# Patient Record
Sex: Female | Born: 1950 | Race: Black or African American | Hispanic: No | Smoking: Former smoker
Health system: Southern US, Community
[De-identification: ages and names within clinical notes are randomized; demographics above are authoritative.]

## PROBLEM LIST (undated history)

## (undated) DIAGNOSIS — G43909 Migraine, unspecified, not intractable, without status migrainosus: Secondary | ICD-10-CM

## (undated) DIAGNOSIS — G8929 Other chronic pain: Secondary | ICD-10-CM

## (undated) DIAGNOSIS — K219 Gastro-esophageal reflux disease without esophagitis: Secondary | ICD-10-CM

## (undated) DIAGNOSIS — E119 Type 2 diabetes mellitus without complications: Secondary | ICD-10-CM

## (undated) DIAGNOSIS — I499 Cardiac arrhythmia, unspecified: Secondary | ICD-10-CM

## (undated) DIAGNOSIS — T4145XA Adverse effect of unspecified anesthetic, initial encounter: Secondary | ICD-10-CM

## (undated) DIAGNOSIS — D497 Neoplasm of unspecified behavior of endocrine glands and other parts of nervous system: Secondary | ICD-10-CM

## (undated) DIAGNOSIS — I509 Heart failure, unspecified: Secondary | ICD-10-CM

## (undated) DIAGNOSIS — T8859XA Other complications of anesthesia, initial encounter: Secondary | ICD-10-CM

## (undated) DIAGNOSIS — F419 Anxiety disorder, unspecified: Secondary | ICD-10-CM

## (undated) DIAGNOSIS — M797 Fibromyalgia: Secondary | ICD-10-CM

## (undated) DIAGNOSIS — J42 Unspecified chronic bronchitis: Secondary | ICD-10-CM

## (undated) DIAGNOSIS — J189 Pneumonia, unspecified organism: Secondary | ICD-10-CM

## (undated) DIAGNOSIS — J449 Chronic obstructive pulmonary disease, unspecified: Secondary | ICD-10-CM

## (undated) DIAGNOSIS — Z87442 Personal history of urinary calculi: Secondary | ICD-10-CM

## (undated) DIAGNOSIS — Z8489 Family history of other specified conditions: Secondary | ICD-10-CM

## (undated) DIAGNOSIS — M1990 Unspecified osteoarthritis, unspecified site: Secondary | ICD-10-CM

## (undated) DIAGNOSIS — I10 Essential (primary) hypertension: Secondary | ICD-10-CM

## (undated) DIAGNOSIS — D649 Anemia, unspecified: Secondary | ICD-10-CM

## (undated) DIAGNOSIS — E785 Hyperlipidemia, unspecified: Secondary | ICD-10-CM

## (undated) DIAGNOSIS — I739 Peripheral vascular disease, unspecified: Secondary | ICD-10-CM

## (undated) DIAGNOSIS — M549 Dorsalgia, unspecified: Secondary | ICD-10-CM

## (undated) HISTORY — PX: BILATERAL CARPAL TUNNEL RELEASE: SHX6508

## (undated) HISTORY — PX: APPENDECTOMY: SHX54

## (undated) HISTORY — PX: REPLACEMENT TOTAL KNEE: SUR1224

## (undated) HISTORY — DX: Anemia, unspecified: D64.9

## (undated) HISTORY — DX: Peripheral vascular disease, unspecified: I73.9

## (undated) HISTORY — DX: Anxiety disorder, unspecified: F41.9

## (undated) HISTORY — DX: Hyperlipidemia, unspecified: E78.5

## (undated) HISTORY — PX: TONSILLECTOMY: SUR1361

## (undated) HISTORY — DX: Heart failure, unspecified: I50.9

## (undated) HISTORY — DX: Unspecified osteoarthritis, unspecified site: M19.90

## (undated) HISTORY — PX: CARDIAC CATHETERIZATION: SHX172

## (undated) HISTORY — PX: COLONOSCOPY: SHX174

## (undated) HISTORY — PX: POSTERIOR LUMBAR FUSION: SHX6036

## (undated) HISTORY — PX: BACK SURGERY: SHX140

---

## 1976-05-18 HISTORY — PX: TUBAL LIGATION: SHX77

## 2002-07-07 ENCOUNTER — Encounter: Payer: Self-pay | Admitting: *Deleted

## 2002-07-07 ENCOUNTER — Inpatient Hospital Stay (HOSPITAL_COMMUNITY): Admission: EM | Admit: 2002-07-07 | Discharge: 2002-07-13 | Payer: Self-pay | Admitting: *Deleted

## 2003-02-20 ENCOUNTER — Encounter: Payer: Self-pay | Admitting: *Deleted

## 2003-02-20 ENCOUNTER — Emergency Department (HOSPITAL_COMMUNITY): Admission: EM | Admit: 2003-02-20 | Discharge: 2003-02-20 | Payer: Self-pay | Admitting: *Deleted

## 2003-07-20 ENCOUNTER — Emergency Department (HOSPITAL_COMMUNITY): Admission: EM | Admit: 2003-07-20 | Discharge: 2003-07-21 | Payer: Self-pay | Admitting: *Deleted

## 2003-07-25 ENCOUNTER — Emergency Department (HOSPITAL_COMMUNITY): Admission: EM | Admit: 2003-07-25 | Discharge: 2003-07-25 | Payer: Self-pay | Admitting: Emergency Medicine

## 2005-05-22 ENCOUNTER — Emergency Department (HOSPITAL_COMMUNITY): Admission: EM | Admit: 2005-05-22 | Discharge: 2005-05-22 | Payer: Self-pay | Admitting: Emergency Medicine

## 2006-06-19 ENCOUNTER — Emergency Department (HOSPITAL_COMMUNITY): Admission: EM | Admit: 2006-06-19 | Discharge: 2006-06-19 | Payer: Self-pay | Admitting: Emergency Medicine

## 2010-06-08 ENCOUNTER — Encounter: Payer: Self-pay | Admitting: Emergency Medicine

## 2011-01-16 ENCOUNTER — Emergency Department (HOSPITAL_COMMUNITY)
Admission: EM | Admit: 2011-01-16 | Discharge: 2011-01-17 | Disposition: A | Discharge status: Home / Self Care | Payer: Medicare Other | Attending: Emergency Medicine | Admitting: Emergency Medicine

## 2011-01-16 ENCOUNTER — Emergency Department (HOSPITAL_COMMUNITY): Payer: Medicare Other

## 2011-01-16 DIAGNOSIS — R109 Unspecified abdominal pain: Secondary | ICD-10-CM | POA: Insufficient documentation

## 2011-01-16 DIAGNOSIS — K5900 Constipation, unspecified: Secondary | ICD-10-CM | POA: Insufficient documentation

## 2011-01-16 DIAGNOSIS — E119 Type 2 diabetes mellitus without complications: Secondary | ICD-10-CM | POA: Insufficient documentation

## 2011-01-16 DIAGNOSIS — K625 Hemorrhage of anus and rectum: Secondary | ICD-10-CM | POA: Insufficient documentation

## 2011-01-16 DIAGNOSIS — R197 Diarrhea, unspecified: Secondary | ICD-10-CM | POA: Insufficient documentation

## 2011-01-16 DIAGNOSIS — Z794 Long term (current) use of insulin: Secondary | ICD-10-CM | POA: Insufficient documentation

## 2011-01-16 DIAGNOSIS — R112 Nausea with vomiting, unspecified: Secondary | ICD-10-CM | POA: Insufficient documentation

## 2011-01-16 HISTORY — DX: Chronic obstructive pulmonary disease, unspecified: J44.9

## 2011-01-16 HISTORY — DX: Essential (primary) hypertension: I10

## 2011-01-16 LAB — CBC
HCT: 37.1 % (ref 36.0–46.0)
MCHC: 32.3 g/dL (ref 30.0–36.0)
Platelets: 318 10*3/uL (ref 150–400)
RDW: 14.8 % (ref 11.5–15.5)
WBC: 10.8 10*3/uL — ABNORMAL HIGH (ref 4.0–10.5)

## 2011-01-16 LAB — LIPASE, BLOOD: Lipase: 24 U/L (ref 11–59)

## 2011-01-16 LAB — DIFFERENTIAL
Basophils Absolute: 0.1 10*3/uL (ref 0.0–0.1)
Basophils Relative: 1 % (ref 0–1)
Eosinophils Absolute: 0.1 10*3/uL (ref 0.0–0.7)
Eosinophils Relative: 1 % (ref 0–5)
Monocytes Absolute: 0.5 10*3/uL (ref 0.1–1.0)

## 2011-01-16 LAB — COMPREHENSIVE METABOLIC PANEL
ALT: 9 U/L (ref 0–35)
Alkaline Phosphatase: 65 U/L (ref 39–117)
BUN: 7 mg/dL (ref 6–23)
CO2: 30 mEq/L (ref 19–32)
GFR calc Af Amer: >60 mL/min (ref >60)
GFR calc non Af Amer: >60 mL/min (ref >60)
Glucose, Bld: 116 mg/dL — ABNORMAL HIGH (ref 70–99)
Potassium: 3.4 mEq/L — ABNORMAL LOW (ref 3.5–5.1)
Sodium: 141 mEq/L (ref 135–145)
Total Bilirubin: 0.2 mg/dL — ABNORMAL LOW (ref 0.3–1.2)

## 2011-01-16 LAB — URINALYSIS, ROUTINE W REFLEX MICROSCOPIC
Bilirubin Urine: NEGATIVE
Nitrite: NEGATIVE
Specific Gravity, Urine: 1.018 (ref 1.005–1.030)
Urobilinogen, UA: 0.2 mg/dL (ref 0.0–1.0)
pH: 5.5 (ref 5.0–8.0)

## 2011-01-16 LAB — URINE MICROSCOPIC-ADD ON

## 2011-01-16 LAB — GLUCOSE, CAPILLARY: Glucose-Capillary: 110 mg/dL — ABNORMAL HIGH (ref 70–99)

## 2011-01-17 ENCOUNTER — Encounter (HOSPITAL_COMMUNITY): Payer: Self-pay | Admitting: Radiology

## 2011-01-17 MED ORDER — IOHEXOL 300 MG/ML  SOLN
100.0000 mL | Freq: Once | INTRAMUSCULAR | Status: AC | PRN
  Administered 2011-01-17: 100 mL via INTRAVENOUS

## 2011-06-25 ENCOUNTER — Emergency Department (HOSPITAL_COMMUNITY)
Admission: EM | Admit: 2011-06-25 | Discharge: 2011-06-25 | Disposition: A | Discharge status: Home / Self Care | Payer: Medicare Other | Attending: Emergency Medicine | Admitting: Emergency Medicine

## 2011-06-25 ENCOUNTER — Emergency Department (HOSPITAL_COMMUNITY): Payer: Medicare Other

## 2011-06-25 ENCOUNTER — Encounter (HOSPITAL_COMMUNITY): Payer: Self-pay | Admitting: Emergency Medicine

## 2011-06-25 DIAGNOSIS — I10 Essential (primary) hypertension: Secondary | ICD-10-CM | POA: Insufficient documentation

## 2011-06-25 DIAGNOSIS — D571 Sickle-cell disease without crisis: Secondary | ICD-10-CM | POA: Insufficient documentation

## 2011-06-25 DIAGNOSIS — J4489 Other specified chronic obstructive pulmonary disease: Secondary | ICD-10-CM | POA: Insufficient documentation

## 2011-06-25 DIAGNOSIS — J449 Chronic obstructive pulmonary disease, unspecified: Secondary | ICD-10-CM | POA: Insufficient documentation

## 2011-06-25 DIAGNOSIS — E119 Type 2 diabetes mellitus without complications: Secondary | ICD-10-CM | POA: Insufficient documentation

## 2011-06-25 DIAGNOSIS — R197 Diarrhea, unspecified: Secondary | ICD-10-CM | POA: Insufficient documentation

## 2011-06-25 DIAGNOSIS — Z7982 Long term (current) use of aspirin: Secondary | ICD-10-CM | POA: Insufficient documentation

## 2011-06-25 DIAGNOSIS — R5381 Other malaise: Secondary | ICD-10-CM | POA: Insufficient documentation

## 2011-06-25 DIAGNOSIS — R109 Unspecified abdominal pain: Secondary | ICD-10-CM | POA: Insufficient documentation

## 2011-06-25 DIAGNOSIS — R112 Nausea with vomiting, unspecified: Secondary | ICD-10-CM | POA: Insufficient documentation

## 2011-06-25 DIAGNOSIS — Z79899 Other long term (current) drug therapy: Secondary | ICD-10-CM | POA: Insufficient documentation

## 2011-06-25 LAB — COMPREHENSIVE METABOLIC PANEL
ALT: 7 U/L (ref 0–35)
AST: 10 U/L (ref 0–37)
Alkaline Phosphatase: 65 U/L (ref 39–117)
CO2: 26 mEq/L (ref 19–32)
Calcium: 9.7 mg/dL (ref 8.4–10.5)
GFR calc non Af Amer: 70 mL/min — ABNORMAL LOW (ref >90)
Potassium: 3.1 mEq/L — ABNORMAL LOW (ref 3.5–5.1)
Sodium: 143 mEq/L (ref 135–145)

## 2011-06-25 LAB — URINALYSIS, ROUTINE W REFLEX MICROSCOPIC
Bilirubin Urine: NEGATIVE
Glucose, UA: NEGATIVE mg/dL
Protein, ur: NEGATIVE mg/dL
Specific Gravity, Urine: 1.019 (ref 1.005–1.030)

## 2011-06-25 LAB — CBC
Hemoglobin: 13.2 g/dL (ref 12.0–15.0)
MCH: 29.2 pg (ref 26.0–34.0)
Platelets: 365 10*3/uL (ref 150–400)
RBC: 4.52 MIL/uL (ref 3.87–5.11)
WBC: 14.4 10*3/uL — ABNORMAL HIGH (ref 4.0–10.5)

## 2011-06-25 MED ORDER — ONDANSETRON HCL 4 MG PO TABS: 4.0000 mg | ORAL_TABLET | Freq: Four times a day (QID) | ORAL | Status: AC

## 2011-06-25 MED ORDER — ONDANSETRON HCL 4 MG/2ML IJ SOLN
INTRAMUSCULAR | Status: AC
  Administered 2011-06-25: 09:00:00
  Filled 2011-06-25: qty 2

## 2011-06-25 MED ORDER — MORPHINE SULFATE 4 MG/ML IJ SOLN
4.0000 mg | Freq: Once | INTRAMUSCULAR | Status: AC
  Administered 2011-06-25: 4 mg via INTRAVENOUS
  Filled 2011-06-25: qty 1

## 2011-06-25 MED ORDER — ONDANSETRON HCL 4 MG/2ML IJ SOLN
4.0000 mg | Freq: Once | INTRAMUSCULAR | Status: AC
  Administered 2011-06-25: 4 mg via INTRAVENOUS
  Filled 2011-06-25: qty 2

## 2011-06-25 MED ORDER — POTASSIUM CHLORIDE CRYS ER 20 MEQ PO TBCR
40.0000 meq | EXTENDED_RELEASE_TABLET | Freq: Once | ORAL | Status: AC
  Administered 2011-06-25: 40 meq via ORAL
  Filled 2011-06-25: qty 2

## 2011-06-25 MED ORDER — SODIUM CHLORIDE 0.9 % IV BOLUS (SEPSIS)
500.0000 mL | Freq: Once | INTRAVENOUS | Status: AC
  Administered 2011-06-25: 500 mL via INTRAVENOUS

## 2011-06-25 NOTE — ED Notes (Signed)
Pt c/o n/v/d with abd pain x 3 days.

## 2011-06-25 NOTE — ED Provider Notes (Signed)
History     CSN: [id number]  Arrival date & time 06/25/11  0865   First MD Initiated Contact with Patient 06/25/11 [phone]      Chief Complaint  Patient presents with  . Nausea  . Emesis  . Diarrhea  . Abdominal Pain    (Consider location/radiation/quality/duration/timing/severity/associated sxs/prior treatment) HPI Patient presents with complaint of vomiting and diarrhea over the past 3 days. She states she has had multiple episodes of nonbloody and nonbilious emesis as well as multiple episodes of watery diarrhea. She denies any fever. She does complain of diffuse abdominal pain which is cramping in nature and temporarily relieved after passing a stool. She's been no blood in her stool or melena. She does have one family member with similar symptoms. She denies any dysuria, urinary frequency or urgency. Today she felt more fatigued and generally weak which prompted ED evaluation. There no other associated systemic symptoms. There is nothing that has made symptoms worse or better. She is diabetic and has been decreasing her insulin dose she's not been taking in many solids.  Past Medical History  Diagnosis Date  . Diabetes mellitus   . Hypertension   . COPD (chronic obstructive pulmonary disease)   . Sickle cell anemia     Past Surgical History  Procedure Date  . Back surgery   . Cardiac catheterization   . Cesarean section     x 3  . Tonsillectomy   . Appendectomy   . Colonoscopy     History reviewed. No pertinent family history.  History  Substance Use Topics  . Smoking status: Not on file  . Smokeless tobacco: Not on file  . Alcohol Use:     OB History    Grav Para Term Preterm Abortions TAB SAB Ect Mult Living                  Review of Systems ROS reviewed and otherwise negative except for mentioned in HPI  Allergies  Review of patient's allergies indicates no known allergies.  Home Medications   Current Outpatient Rx  Name Route Sig Dispense Refill    . ASPIRIN 325 MG PO TABS Oral Take 325 mg by mouth daily.    . ATORVASTATIN CALCIUM 80 MG PO TABS Oral Take 80 mg by mouth daily.    Marland Kitchen CARVEDILOL 6.25 MG PO TABS Oral Take 6.25 mg by mouth 2 (two) times daily with a meal.    . COLACE PO Oral Take 1 capsule by mouth 2 (two) times daily.    Marland Kitchen CONJ ESTROG-MEDROXYPROGEST ACE 0.45-1.5 MG PO TABS Oral Take 1 tablet by mouth daily.    Marland Kitchen FAMOTIDINE 20 MG PO TABS Oral Take 20 mg by mouth 2 (two) times daily.    . FENTANYL 100 MCG/HR TD PT72 Transdermal Place 1 patch onto the skin every 3 (three) days.    . FENTANYL 50 MCG/HR TD PT72 Transdermal Place 1 patch onto the skin every 3 (three) days.    . INSULIN ISOPHANE & REGULAR (70-30) 100 UNIT/ML [place] SUSP Subcutaneous Inject 20 Units into the skin 2 (two) times daily before a meal. Per sliding scale.    Marland Kitchen LISINOPRIL-HYDROCHLOROTHIAZIDE 20-12.5 MG PO TABS Oral Take 1 tablet by mouth daily.    Marland Kitchen PREGABALIN 150 MG PO CAPS Oral Take 150 mg by mouth 4 (four) times daily.    Marland Kitchen PROMETHAZINE HCL 25 MG PO TABS Oral Take 25 mg by mouth every 4 (four) hours as needed. For nausea    .  TIZANIDINE HCL 2 MG PO TABS Oral Take 2 mg by mouth every 8 (eight) hours.    . TRAMADOL HCL 50 MG PO TABS Oral Take 50 mg by mouth 2 (two) times daily.    Marland Kitchen ONDANSETRON HCL 4 MG PO TABS Oral Take 1 tablet (4 mg total) by mouth every 6 (six) hours. 12 tablet 0    BP 154/69  Pulse 77  Temp(Src) 98.1 F (36.7 C) (Oral)  Resp 24  Ht 5\' 2"  (1.575 m)  Wt 233 lb (105.688 kg)  BMI 42.62 kg/m2  SpO2 98% Vitals reviewed Physical Exam Physical Examination: General appearance - alert, well appearing, and in no distress Mental status - alert, oriented to person, place, and time Eyes - pupils equal and reactive, no scleral icterus Mouth - mucous membranes moist, pharynx normal without lesions Chest - clear to auscultation, no wheezes, rales or rhonchi, symmetric air entry Heart - normal rate, regular rhythm, normal S1, S2, no murmurs,  rubs, clicks or gallops Abdomen - soft, nontender, nondistended, no masses or organomegaly Neurological - alert, oriented, normal speech, no focal findings Musculoskeletal - no joint tenderness, deformity or swelling Extremities - peripheral pulses normal, no pedal edema, no clubbing or cyanosis Skin - normal coloration and turgor, no rashes  ED Course  Procedures (including critical care time)  1:46 PM pt tolerating po fluids, symptoms much improved.    Labs Reviewed  CBC - Abnormal; Notable for the following:    WBC 14.4 (*)    All other components within normal limits  COMPREHENSIVE METABOLIC PANEL - Abnormal; Notable for the following:    Potassium 3.1 (*)    Glucose, Bld 133 (*)    Total Bilirubin 0.2 (*)    GFR calc non Af Amer 70 (*)    GFR calc Af Amer 81 (*)    All other components within normal limits  URINALYSIS, ROUTINE W REFLEX MICROSCOPIC - Abnormal; Notable for the following:    Hgb urine dipstick MODERATE (*)    All other components within normal limits  URINE MICROSCOPIC-ADD ON - Abnormal; Notable for the following:    Squamous Epithelial / LPF FEW (*) FEW   All other components within normal limits  LIPASE, BLOOD  URINE CULTURE   Dg Abd Acute W/chest  06/25/2011  *RADIOLOGY REPORT*  Clinical Data: Upper abdominal pain with nausea and vomiting.  ACUTE ABDOMEN SERIES (ABDOMEN 2 VIEW & CHEST 1 VIEW)  Comparison: Acute abdominal series 05/22/2005.  Abdominal pelvic CT 01/17/2011.  Findings: The heart size and mediastinal contours are stable.  The lungs are clear.  There is no pleural effusion or pneumothorax.  The abdomen is nearly gasless.  Some air is present within the stomach and rectum.  No bowel wall thickening or free intraperitoneal air is identified.  Left paraspinal and multiple pelvic calcifications are unchanged, corresponding with phleboliths on prior CT.  No acute osseous findings are evident.  IMPRESSION:  1.  No acute abdominal or cardiopulmonary process  identified. 2.  Multiple paraspinal and pelvic phleboliths appear unchanged.  Original Report Authenticated By: Gerrianne Scale, M.D.     1. Nausea vomiting and diarrhea       MDM  Patient with complaint of vomiting and diarrhea which has been going on for 2-3 days. Her workup in the ED today revealed mild hypokalemia and mild hyperglycemia as well as hypertension. Other labs as well as acute abdominal series were reassuring. Patient appears well-hydrated. Her vomiting was controlled after Zofran. She  was given IV hydration with normal saline and her symptoms have much improved. Her potassium was repleted orally and she has been tolerating fluids by mouth in the ED period patient is to be discharged with strict return precautions and she verbalizes understanding of this plan and is in agreement        Ethelda Chick, MD 06/26/11 [phone]

## 2011-06-25 NOTE — ED Notes (Addendum)
Patient resting and remains on monitor and oxygen sats are 98% on RA.

## 2011-06-25 NOTE — ED Notes (Signed)
Per EMS - N/V and diarrhea with Left bundle block on 12 lead. Patient stated to EMS that she was not aware of any blockages in her heart. CBG 99. BP 198/104 HR 90 Resp 20 Saturation 100 on 2L. 20 ga. In left hand. EMS administered zofran with no relief.

## 2011-06-25 NOTE — ED Notes (Addendum)
Patient states he was getting ready for work this morning and thought he twisted wrong and started to have pain in his left side and pain radiating to LLQ of Abdomen. Patient states he has facial and bilateral arm tingling and numbness. Patient denies chest pain,  Vomiting or fever.

## 2011-06-25 NOTE — ED Notes (Signed)
Patient resting with NAD at this time. Patient remains on monitor with oxygen sats of 99% on RA. Family at bedside.

## 2011-06-25 NOTE — ED Notes (Signed)
Patient given ginger ale. 

## 2011-06-25 NOTE — ED Notes (Signed)
Patient resting with NAD at this time. Patient remains on monitor and oxygen sats of 100% RA.  Family at bedside.

## 2011-06-26 LAB — URINE CULTURE
Colony Count: NO GROWTH
Culture: NO GROWTH

## 2012-03-31 DIAGNOSIS — G43909 Migraine, unspecified, not intractable, without status migrainosus: Secondary | ICD-10-CM | POA: Insufficient documentation

## 2012-04-20 DIAGNOSIS — G8929 Other chronic pain: Secondary | ICD-10-CM | POA: Insufficient documentation

## 2012-06-01 ENCOUNTER — Encounter: Payer: Self-pay | Admitting: Physical Medicine & Rehabilitation

## 2012-06-16 ENCOUNTER — Encounter (HOSPITAL_COMMUNITY): Payer: Self-pay

## 2012-06-20 ENCOUNTER — Encounter: Payer: Self-pay | Admitting: Physical Medicine & Rehabilitation

## 2012-06-20 ENCOUNTER — Encounter: Payer: Medicare Other | Attending: Physical Medicine & Rehabilitation

## 2012-06-20 ENCOUNTER — Ambulatory Visit (HOSPITAL_BASED_OUTPATIENT_CLINIC_OR_DEPARTMENT_OTHER): Payer: Medicare Other | Admitting: Physical Medicine & Rehabilitation

## 2012-06-20 VITALS — BP 110/48 | HR 89 | Resp 14 | Ht 62.0 in | Wt 241.0 lb

## 2012-06-20 DIAGNOSIS — M25559 Pain in unspecified hip: Secondary | ICD-10-CM | POA: Insufficient documentation

## 2012-06-20 DIAGNOSIS — M961 Postlaminectomy syndrome, not elsewhere classified: Secondary | ICD-10-CM

## 2012-06-20 DIAGNOSIS — M797 Fibromyalgia: Secondary | ICD-10-CM | POA: Insufficient documentation

## 2012-06-20 DIAGNOSIS — M25569 Pain in unspecified knee: Secondary | ICD-10-CM | POA: Insufficient documentation

## 2012-06-20 DIAGNOSIS — M25519 Pain in unspecified shoulder: Secondary | ICD-10-CM | POA: Insufficient documentation

## 2012-06-20 DIAGNOSIS — G894 Chronic pain syndrome: Secondary | ICD-10-CM | POA: Insufficient documentation

## 2012-06-20 DIAGNOSIS — M549 Dorsalgia, unspecified: Secondary | ICD-10-CM | POA: Insufficient documentation

## 2012-06-20 DIAGNOSIS — E1142 Type 2 diabetes mellitus with diabetic polyneuropathy: Secondary | ICD-10-CM | POA: Insufficient documentation

## 2012-06-20 DIAGNOSIS — Z79899 Other long term (current) drug therapy: Secondary | ICD-10-CM | POA: Insufficient documentation

## 2012-06-20 DIAGNOSIS — M542 Cervicalgia: Secondary | ICD-10-CM | POA: Insufficient documentation

## 2012-06-20 DIAGNOSIS — IMO0001 Reserved for inherently not codable concepts without codable children: Secondary | ICD-10-CM

## 2012-06-20 DIAGNOSIS — M79603 Pain in arm, unspecified: Secondary | ICD-10-CM | POA: Insufficient documentation

## 2012-06-20 DIAGNOSIS — E1149 Type 2 diabetes mellitus with other diabetic neurological complication: Secondary | ICD-10-CM | POA: Insufficient documentation

## 2012-06-20 DIAGNOSIS — M25561 Pain in right knee: Secondary | ICD-10-CM

## 2012-06-20 DIAGNOSIS — Z5181 Encounter for therapeutic drug level monitoring: Secondary | ICD-10-CM

## 2012-06-20 NOTE — Progress Notes (Signed)
Subjective:    Patient ID: Christina Solis, female    DOB: [birth year], 62 y.o.   MRN: [id number]  HPI  Chief complaint pain all over body. Worst spots are back and knees and shoulders History of torn rotator cuff History of knee pain, this responded to Synvisc injections. Left knee is not hurting. Right knee had a Synvisc injection one month ago. She had a fairly recent fall and has had increased pain since that time Not sure when the last x-ray of the right knee was Was seen at a pain clinic in [place] Had epidural injections for her back in the past which was not very successful. Past surgical history positive for 2 back surgeries, 1993 and 1995, No shoulder surgery, no knee surgery  Pain Inventory Average Pain 9 Pain Right Now 9 My pain is sharp, burning, stabbing, tingling and aching  In the last 24 hours, has pain interfered with the following? General activity 9 Relation with others 9 Enjoyment of life 7 What TIME of day is your pain at its worst? evening and night Sleep (in general) Poor  Pain is worse with: walking, bending, inactivity, standing and some activites Pain improves with: medication Relief from Meds: 6  Mobility walk without assistance how many minutes can you walk? 15 ability to climb steps?  yes do you drive?  yes  Function disabled: date disabled 2006  Neuro/Psych bowel control problems weakness numbness tingling trouble walking spasms anxiety  Prior Studies Any changes since last visit?  yes CT/MRI  Physicians involved in your care Any changes since last visit?  no   Family History  Problem Relation Age of Onset  . Diabetes Mother   . Cancer Father   . Kidney disease Brother    History   Social History  . Marital Status: Divorced    Spouse Name: N/A    Number of Children: N/A  . Years of Education: N/A   Social History Main Topics  . Smoking status: Current Every Day Smoker -- 0.3 packs/day for 30 years    Types: Cigarettes     Start date: 06/20/2000  . Smokeless tobacco: Never Used  . Alcohol Use: None  . Drug Use: None  . Sexually Active: None   Other Topics Concern  . None   Social History Narrative  . None   Past Surgical History  Procedure Date  . Back surgery   . Cardiac catheterization   . Cesarean section     x 3  . Tonsillectomy   . Appendectomy   . Colonoscopy    Past Medical History  Diagnosis Date  . Diabetes mellitus   . Hypertension   . COPD (chronic obstructive pulmonary disease)   . Sickle cell anemia    BP 110/48  Pulse 89  Resp 14  Ht 5\' 2"  (1.575 m)  Wt 241 lb (109.317 kg)  BMI 44.08 kg/m2  SpO2 93%   Review of Systems  Constitutional: Positive for diaphoresis and unexpected weight change.  Respiratory: Positive for apnea.   Cardiovascular: Positive for leg swelling.  Gastrointestinal: Positive for nausea and constipation.  Musculoskeletal: Positive for myalgias and gait problem.       Spasms   Neurological: Positive for weakness and numbness.  Psychiatric/Behavioral: The patient is nervous/anxious.        Objective:   Physical Exam  Constitutional: She is oriented to person, place, and time.  Neurological: She is alert and oriented to person, place, and time. She has normal strength. A  sensory deficit is present. She exhibits normal muscle tone. Gait abnormal. Coordination normal.  Reflex Scores:      Tricep reflexes are 2+ on the right side and 2+ on the left side.      Bicep reflexes are 2+ on the right side and 2+ on the left side.      Brachioradialis reflexes are 2+ on the right side and 2+ on the left side.      Patellar reflexes are 0 on the right side and 0 on the left side.      Achilles reflexes are 0 on the right side and 0 on the left side.      Decrease stance phase Decreased sensation in both feet compare the knees in a stocking distribution          Assessment & Plan:  1 fibromyalgia syndrome with diffuse body pain and positive  tender points in the neck and upper back area as well as over the hips  2. Lumbar postlaminectomy syndrome. Has been several years since any imaging studies have been done. Her pain may be due to either degenerative disc or lumbar spondylosis.. She may respond to medial branch blocks if this is mainly spondylosis. Will check imaging studies  3. Right knee pain presumed arthritis given that she has responded to Synvisc in the past. Will recheck x-rays. Continue Voltaren gel.  4. Shoulder pain likely rotator cuff syndrome mainly on the left side. Would recommend range of motion exercises as well as continuing the medications she is oriented. She may try some diclofenac gel on this as well  5. Chronic pain syndrome these various diagnoses cause pain as well as reduced function. Will continue current medications. We'll need to check urine drug screen prior to prescribing oxycodone.She should respond to low-dose oxycodone 5 mg 3 times a day. Would continue tizanidine and tramadol as well  6. Diabetic neuropathy continue Neurontin, Lyrica, continue strict diabetic management

## 2012-06-23 ENCOUNTER — Ambulatory Visit (HOSPITAL_COMMUNITY)
Admission: RE | Admit: 2012-06-23 | Discharge: 2012-06-23 | Disposition: A | Discharge status: Home / Self Care | Payer: Medicare Other | Source: Ambulatory Visit | Attending: Physical Medicine & Rehabilitation | Admitting: Physical Medicine & Rehabilitation

## 2012-06-23 DIAGNOSIS — M79609 Pain in unspecified limb: Secondary | ICD-10-CM | POA: Insufficient documentation

## 2012-06-23 DIAGNOSIS — M961 Postlaminectomy syndrome, not elsewhere classified: Secondary | ICD-10-CM

## 2012-06-23 DIAGNOSIS — M25561 Pain in right knee: Secondary | ICD-10-CM

## 2012-06-23 DIAGNOSIS — M545 Low back pain, unspecified: Secondary | ICD-10-CM | POA: Insufficient documentation

## 2012-06-23 DIAGNOSIS — M25569 Pain in unspecified knee: Secondary | ICD-10-CM | POA: Insufficient documentation

## 2012-06-28 ENCOUNTER — Ambulatory Visit (HOSPITAL_COMMUNITY)
Admission: RE | Admit: 2012-06-28 | Discharge: 2012-06-28 | Disposition: A | Discharge status: Home / Self Care | Payer: Medicare Other | Source: Ambulatory Visit | Attending: Cardiology | Admitting: Cardiology

## 2012-06-28 ENCOUNTER — Encounter (HOSPITAL_COMMUNITY)
Admission: RE | Disposition: A | Discharge status: Home / Self Care | Payer: Self-pay | Source: Ambulatory Visit | Attending: Cardiology

## 2012-06-28 DIAGNOSIS — IMO0001 Reserved for inherently not codable concepts without codable children: Secondary | ICD-10-CM

## 2012-06-28 DIAGNOSIS — I739 Peripheral vascular disease, unspecified: Secondary | ICD-10-CM

## 2012-06-28 DIAGNOSIS — R079 Chest pain, unspecified: Secondary | ICD-10-CM | POA: Insufficient documentation

## 2012-06-28 DIAGNOSIS — I70219 Atherosclerosis of native arteries of extremities with intermittent claudication, unspecified extremity: Secondary | ICD-10-CM | POA: Insufficient documentation

## 2012-06-28 HISTORY — PX: LOWER EXTREMITY ANGIOGRAM: SHX5508

## 2012-06-28 HISTORY — PX: FEMORAL ARTERY STENT: SHX1583

## 2012-06-28 HISTORY — PX: LEFT HEART CATHETERIZATION WITH CORONARY ANGIOGRAM: SHX5451

## 2012-06-28 HISTORY — PX: ABDOMINAL AORTAGRAM: SHX5454

## 2012-06-28 LAB — POCT ACTIVATED CLOTTING TIME: Activated Clotting Time: 258 seconds

## 2012-06-28 SURGERY — LEFT HEART CATHETERIZATION WITH CORONARY ANGIOGRAM
Anesthesia: LOCAL

## 2012-06-28 MED ORDER — POTASSIUM CHLORIDE 20 MEQ PO PACK
20.0000 meq | PACK | Freq: Once | ORAL | Status: DC
  Administered 2012-06-28: 20 meq via ORAL

## 2012-06-28 MED ORDER — SODIUM CHLORIDE 0.9 % IV SOLN
INTRAVENOUS | Status: DC
  Administered 2012-06-28: 07:00:00 via INTRAVENOUS

## 2012-06-28 MED ORDER — SODIUM CHLORIDE 0.9 % IV SOLN: 1.0000 mL/kg/h | INTRAVENOUS | Status: DC

## 2012-06-28 MED ORDER — NITROGLYCERIN 1 MG/10 ML FOR IR/CATH LAB
INTRA_ARTERIAL | Status: AC
  Filled 2012-06-28: qty 10

## 2012-06-28 MED ORDER — HYDROMORPHONE HCL PF 2 MG/ML IJ SOLN
INTRAMUSCULAR | Status: AC
  Filled 2012-06-28: qty 1

## 2012-06-28 MED ORDER — SODIUM CHLORIDE 0.9 % IV SOLN: 250.0000 mL | INTRAVENOUS | Status: DC | PRN

## 2012-06-28 MED ORDER — FENTANYL CITRATE 0.05 MG/ML IJ SOLN
INTRAMUSCULAR | Status: AC
  Filled 2012-06-28: qty 2

## 2012-06-28 MED ORDER — POTASSIUM CHLORIDE CRYS ER 20 MEQ PO TBCR: 20.0000 meq | EXTENDED_RELEASE_TABLET | Freq: Once | ORAL | Status: DC

## 2012-06-28 MED ORDER — CLOPIDOGREL BISULFATE 75 MG PO TABS: 75.0000 mg | ORAL_TABLET | Freq: Every day | ORAL | Status: DC

## 2012-06-28 MED ORDER — ONDANSETRON HCL 4 MG/2ML IJ SOLN: 4.0000 mg | Freq: Four times a day (QID) | INTRAMUSCULAR | Status: DC | PRN

## 2012-06-28 MED ORDER — ACETAMINOPHEN 325 MG PO TABS: 650.0000 mg | ORAL_TABLET | ORAL | Status: DC | PRN

## 2012-06-28 MED ORDER — SODIUM CHLORIDE 0.9 % IJ SOLN: 3.0000 mL | Freq: Two times a day (BID) | INTRAMUSCULAR | Status: DC

## 2012-06-28 MED ORDER — POTASSIUM CHLORIDE CRYS ER 20 MEQ PO TBCR
EXTENDED_RELEASE_TABLET | ORAL | Status: AC
  Filled 2012-06-28: qty 1

## 2012-06-28 MED ORDER — HEPARIN (PORCINE) IN NACL 2-0.9 UNIT/ML-% IJ SOLN
INTRAMUSCULAR | Status: AC
  Filled 2012-06-28: qty 1000

## 2012-06-28 MED ORDER — CLOPIDOGREL BISULFATE 300 MG PO TABS
ORAL_TABLET | ORAL | Status: AC
  Filled 2012-06-28: qty 1

## 2012-06-28 MED ORDER — HEPARIN SODIUM (PORCINE) 1000 UNIT/ML IJ SOLN
INTRAMUSCULAR | Status: AC
  Filled 2012-06-28: qty 1

## 2012-06-28 MED ORDER — LIDOCAINE HCL (PF) 1 % IJ SOLN
INTRAMUSCULAR | Status: AC
  Filled 2012-06-28: qty 30

## 2012-06-28 MED ORDER — ASPIRIN 81 MG PO CHEW
324.0000 mg | CHEWABLE_TABLET | ORAL | Status: AC
  Administered 2012-06-28: 324 mg via ORAL

## 2012-06-28 MED ORDER — OXYCODONE-ACETAMINOPHEN 5-325 MG PO TABS: 1.0000 | ORAL_TABLET | Freq: Once | ORAL | Status: DC

## 2012-06-28 MED ORDER — MIDAZOLAM HCL 2 MG/2ML IJ SOLN
INTRAMUSCULAR | Status: AC
  Filled 2012-06-28: qty 2

## 2012-06-28 MED ORDER — SODIUM CHLORIDE 0.9 % IJ SOLN: 3.0000 mL | INTRAMUSCULAR | Status: DC | PRN

## 2012-06-28 MED ORDER — OXYCODONE-ACETAMINOPHEN 5-325 MG PO TABS
ORAL_TABLET | ORAL | Status: AC
  Administered 2012-06-28: 1
  Filled 2012-06-28: qty 1

## 2012-06-28 MED ORDER — ASPIRIN 81 MG PO CHEW
CHEWABLE_TABLET | ORAL | Status: AC
  Filled 2012-06-28: qty 4

## 2012-06-28 NOTE — CV Procedure (Signed)
Procedures performed: Femoral access Left heart catheterization including left ventriculography, selective right and left coronary arteriography.   Abdominal aortogram. Abdominal aortogram and crossover from right into the left femoral artery placement of catheter tip in the left femoral artery and left femoral arteriogram with distal runoff.  PTA and stenting of the left SFA with 7.0x36mm Zilver self expanding stent.  Right femoral arteriogram with distal runoff.  Right femoral arteriogram and closure of the right femoral arterial access with Perclose.  Indication: Chest pain, abnormal stress test, tobacco use disorder, dyspnea  DM , HTN . Claudication bilateral legs and moderate decrease in ABI bilateral. :   HEMODYNAMIC DATA: The left ventricle pressure was 128/9 with  EDP15. Aortic pressure was  125/67 with a mean of 89 mmHg.  There was no pressure gradient across the aortic valve   Right coronary artery: Right coronary is a large caliber vessel and a  dominant vessel. It is tortuous. It is smooth and normal.   Left main coronary artery: Left main coronary artery is a large caliber  vessel, which is smooth and normal.   Circumflex: Circumflex is a moderate caliber vessel, giving origin to a  small obtuse marginal 1. Smooth and normal.   [place] intermediate: NA.   LAD: LAD is a large caliber vessel, giving origin to several small  diagonals. It is smooth and normal. Proximal LAD showed spasm which resolved with IC NTG administration.   Left ventriculogram: Left ventriculography revealed ejection fraction  of 55-60%. There was no wall motion abnormality.  IMPRESSIONS:  1. Normal coronary arteries, right dominant circulation. Proximal LAD showing spasm which relieved with intracoronary this administration. LVEF: 60% without regional wall motion abnormality.   Peripheral arthrogram: No evidence of abdominal aneurysm. 2 renal arteries one on either side and they're widely patent.  The right renal artery has a inferior origin. Aortoiliac bifurcation was widely patent.   Femoral arteriogram: Normal femoral arteries and moderate disease in the distal SFA constituting 70-80% stenoses bilaterally. Left SFA disease appeared more severe with laminar flow and haziness at the site of stenosis. Slow filling of the peripheral vessels. Three-vessel runoff is noted on both legs.   Interventional data: Successful PTA, initial scoring balloon angioplasty with utilization of a 6.0 x 40 mm angiosculpt balloon and followed by PTA and stenting of the left SFA by implanting a 7.0 x 60 mm self-expanding Zilver stent and post dilating with a 6.0 x 40 mm Mustang balloon at 4 atmospheric pressure for 60 seconds. Stenosis reduced from 80% to 0% with brisk flow. Slow filling improved with intra-arterial nitroglycerin administration.  TECHNICAL PROCEDURE: Under sterile precautions using a 6-French right femoral A.  access and A 6 [place] multipurpose B2 catheter was advanced via right femoral arterial access. The catheter was advanced into the left ventricle and left ventriculography was performed in the RAO projection  The catheter was then pulled into the ascending aorta and selective right and left coronary arteriogram was performed. The cath was then pulled out of the body over a versacore wire.  Abdominal aortogram: This was performed with the help of a 5 French Omni Flush catheter.  Femoral arterial runoff: Right and left femoral arterial runoff was performed using the same catheter after crossing over from the right femoral artery into the left femoral artery with the help of a Versacore. The catheter tip was positioned into the left external iliac artery and left femoral arteriogram was performed.   After confirming high-grade stenosis of the left SFA,  a 6 French right femoral arterial access sheath was exchanged to a 6 French 45 cm straight Terumo sheath was utilized and placed in the left external  iliac artery. Using 7000 units of intravenous heparin was administered to the patient and ACT was maintained greater than 200. I cross the stenosis with the same Versacore wire and this was followed by scoring balloon angioplasty with a 6.0 x 40 mm balloon add peak of 7 atmospheric pressure for 2 minutes x2. Repeat angiography revealed a dissection flap. Hence I decided to stent this with a self-expanding 7.0 x 60 mm Zilver stent followed by post dilatation with a 6.0 x 40 mm Mustang balloon at 4 atmospheric pressure for 60 seconds. Following this 200 mcg of intra-arterial nitroglycerin was administered and angiography was performed. Excellent result was evident. The sheath was pulled into the right common femoral artery and right femoral arteriogram with distal runoff was performed followed by a right femoral arteriogram.  Right femoral arteriogram was performed to evaluate the arterial access and arterial access was closed with Perclose with excellent hemostasis.  Patient tolerated the procedure well. There was no immediate complication.

## 2012-06-28 NOTE — H&P (Signed)
  Please see paper chart  

## 2012-06-28 NOTE — Interval H&P Note (Signed)
History and Physical Interval Note:  06/28/2012 6:42 AM  Christina Solis  has presented today for surgery, with the diagnosis of Claudication  The various methods of treatment have been discussed with the patient and family. After consideration of risks, benefits and other options for treatment, the patient has consented to  Procedure(s): LEFT HEART CATHETERIZATION WITH CORONARY ANGIOGRAM (N/A) LOWER EXTREMITY ANGIOGRAM (N/A) ABDOMINAL AORTAGRAM (N/A) as a surgical intervention and possible angioplasty .  The patient's history has been reviewed, patient examined, no change in status, stable for surgery.  I have reviewed the patient's chart and labs.  Questions were answered to the patient's satisfaction.     Pamella Pert

## 2012-06-29 LAB — GLUCOSE, CAPILLARY: Glucose-Capillary: 114 mg/dL — ABNORMAL HIGH (ref 70–99)

## 2012-07-04 ENCOUNTER — Encounter: Payer: Self-pay | Admitting: Physical Medicine & Rehabilitation

## 2012-07-04 ENCOUNTER — Ambulatory Visit (HOSPITAL_BASED_OUTPATIENT_CLINIC_OR_DEPARTMENT_OTHER): Payer: Medicare Other | Admitting: Physical Medicine & Rehabilitation

## 2012-07-04 VITALS — BP 116/49 | HR 73 | Resp 14 | Ht 62.0 in | Wt 241.0 lb

## 2012-07-04 DIAGNOSIS — E1142 Type 2 diabetes mellitus with diabetic polyneuropathy: Secondary | ICD-10-CM

## 2012-07-04 DIAGNOSIS — E1140 Type 2 diabetes mellitus with diabetic neuropathy, unspecified: Secondary | ICD-10-CM

## 2012-07-04 DIAGNOSIS — IMO0001 Reserved for inherently not codable concepts without codable children: Secondary | ICD-10-CM

## 2012-07-04 DIAGNOSIS — E1149 Type 2 diabetes mellitus with other diabetic neurological complication: Secondary | ICD-10-CM

## 2012-07-04 DIAGNOSIS — M961 Postlaminectomy syndrome, not elsewhere classified: Secondary | ICD-10-CM

## 2012-07-04 DIAGNOSIS — M797 Fibromyalgia: Secondary | ICD-10-CM

## 2012-07-04 MED ORDER — GABAPENTIN 300 MG PO CAPS: 300.0000 mg | ORAL_CAPSULE | Freq: Three times a day (TID) | ORAL | Status: DC

## 2012-07-04 NOTE — Patient Instructions (Addendum)

## 2012-07-04 NOTE — Progress Notes (Signed)
Subjective:    Patient ID: Christina Solis, female    DOB: 07-Jan-1951, 62 y.o.   MRN: [id number]  HPI  Chief complaint pain all over body. Worst spots are back and knees and shoulders  History of torn rotator cuff  History of knee pain, this responded to Synvisc injections. Left knee is not hurting. Right knee had a Synvisc injection one month ago. She had a fairly recent fall and has had increased pain since that time  Not sure when the last x-ray of the right knee was  Was seen at a pain clinic in [place]  Had epidural injections for her back in the past which was not very successful.  Past surgical history positive for 2 back surgeries, 1993 and 1995, No shoulder surgery, no knee surgery  The urine drug screen demonstrated evidence of hydrocodone at a moderate level as well as hydromorphone and nor hydrocodone metabolites. This is inconsistent with her report of no narcotic analgesics for 7 days prior to testing. Pain Inventory Average Pain 9 Pain Right Now 9 My pain is sharp, burning, dull, stabbing, tingling and aching  In the last 24 hours, has pain interfered with the following? General activity 4 Relation with others 7 Enjoyment of life 6 What TIME of day is your pain at its worst? daytime evening and night Sleep (in general) Fair  Pain is worse with: walking, bending, sitting, standing and some activites Pain improves with: rest, heat/ice and medication Relief from Meds: 7  Mobility walk without assistance ability to climb steps?  yes do you drive?  yes  Function retired I need assistance with the following:  household duties  Neuro/Psych weakness numbness tingling trouble walking spasms dizziness  Prior Studies Any changes since last visit?  yes  Stents placed  Physicians involved in your care Dr Jacinto Halim Cardiologist   Family History  Problem Relation Age of Onset  . Diabetes Mother   . Cancer Father   . Kidney disease Brother    History   Social  History  . Marital Status: Divorced    Spouse Name: N/A    Number of Children: N/A  . Years of Education: N/A   Social History Main Topics  . Smoking status: Current Every Day Smoker -- 0.30 packs/day for 30 years    Types: Cigarettes    Start date: 06/20/2000  . Smokeless tobacco: Never Used  . Alcohol Use: None  . Drug Use: None  . Sexually Active: None   Other Topics Concern  . None   Social History Narrative  . None   Past Surgical History  Procedure Laterality Date  . Back surgery    . Cardiac catheterization    . Cesarean section      x 3  . Tonsillectomy    . Appendectomy    . Colonoscopy     Past Medical History  Diagnosis Date  . Diabetes mellitus   . Hypertension   . COPD (chronic obstructive pulmonary disease)   . Sickle cell anemia    BP 116/49  Pulse 73  Resp 14  Ht 5\' 2"  (1.575 m)  Wt 241 lb (109.317 kg)  BMI 44.07 kg/m2  SpO2 97%   Review of Systems  Gastrointestinal: Positive for constipation.  Musculoskeletal: Positive for back pain and gait problem.       Spasms  Neurological: Positive for dizziness, weakness and numbness.       Tingling  All other systems reviewed and are negative.  Objective:   Physical Exam Constitutional: She is oriented to person, place, and time.  Neurological: She is alert and oriented to person, place, and time. She has normal strength. A sensory deficit is present. She exhibits normal muscle tone. Gait abnormal. Coordination normal.  Reflex Scores:  Tricep reflexes are 2+ on the right side and 2+ on the left side.  Bicep reflexes are 2+ on the right side and 2+ on the left side.  Brachioradialis reflexes are 2+ on the right side and 2+ on the left side.  Patellar reflexes are 0 on the right side and 0 on the left side.  Achilles reflexes are 0 on the right side and 0 on the left side. Decrease stance phase Decreased sensation in both feet compare the knees in a stocking distribution          Assessment & Plan:  1. fibromyalgia syndrome with diffuse body pain and positive tender points in the neck and upper back area as well as over the hips  2. Lumbar postlaminectomy syndrome. Has been several years since any imaging studies have been done. Her pain may be due to either degenerative disc or lumbar spondylosis.. She may respond to medial branch blocks if this is mainly spondylosis. Will check imaging studies  3. Right knee pain presumed arthritis given that she has responded to Synvisc in the past. Will recheck x-rays. Continue Voltaren gel.  4. Shoulder pain likely rotator cuff syndrome mainly on the left side. Would recommend range of motion exercises as well as continuing the medications she is oriented. She may try some diclofenac gel on this as well  5. Chronic pain syndrome these various diagnoses cause pain as well as reduced function. Will continue current medications. Because of inconsistent urine drug screen nonnarcotic treatment program discussed with patient. She is in agreement with this Would continue tizanidine and tramadol as well  6. Diabetic neuropathy continue Neurontin, Lyrica, continue strict diabetic management

## 2012-11-03 ENCOUNTER — Ambulatory Visit: Payer: Medicare Other | Admitting: Physical Medicine & Rehabilitation

## 2012-11-04 ENCOUNTER — Ambulatory Visit: Payer: Medicare Other | Admitting: Physical Medicine & Rehabilitation

## 2012-11-07 ENCOUNTER — Encounter: Payer: Self-pay | Admitting: Physical Medicine & Rehabilitation

## 2012-11-07 ENCOUNTER — Ambulatory Visit (HOSPITAL_BASED_OUTPATIENT_CLINIC_OR_DEPARTMENT_OTHER): Payer: Medicare Other | Admitting: Physical Medicine & Rehabilitation

## 2012-11-07 ENCOUNTER — Encounter: Payer: Medicare Other | Attending: Physical Medicine & Rehabilitation

## 2012-11-07 VITALS — BP 144/70 | HR 86 | Resp 14 | Ht 62.0 in | Wt 236.2 lb

## 2012-11-07 DIAGNOSIS — M1710 Unilateral primary osteoarthritis, unspecified knee: Secondary | ICD-10-CM

## 2012-11-07 DIAGNOSIS — M25569 Pain in unspecified knee: Secondary | ICD-10-CM | POA: Insufficient documentation

## 2012-11-07 DIAGNOSIS — M1712 Unilateral primary osteoarthritis, left knee: Secondary | ICD-10-CM

## 2012-11-07 DIAGNOSIS — IMO0002 Reserved for concepts with insufficient information to code with codable children: Secondary | ICD-10-CM

## 2012-11-07 NOTE — Progress Notes (Signed)
Knee injection with synvisc under ultrasound guidance  Indication:Left Knee pain not relieved by medication management and other conservative care.  Informed consent was obtained after describing risks and benefits of the procedure with the patient, this includes bleeding, bruising, infection and medication side effects. The patient wishes to proceed and has given written consent. The patient was placed in a recumbent position. The medial aspect of the knee was marked and prepped with Betadine and alcohol. It was then entered with a 25-gauge 1-1/2 inch needle and 1 mL of 1% lidocaine was injected into the skin and subcutaneous tissue. Then another 50mm 22gauge ECHO block needle was inserted into the knee joint. After withdrawing 5cc of synovial fluid, 2 ml of Synvisc were injected. The patient tolerated the procedure well. Post procedure instructions were given.

## 2012-11-07 NOTE — Patient Instructions (Addendum)
Hylan G-F 20 intra-articular injection What is this medicine? HYLAN G-F 20 (HI lan G F 20) is used to treat osteoarthritis of the knee. It lubricates and cushions the joint, reducing pain in the knee. This medicine may be used for other purposes; ask your health care provider or pharmacist if you have questions. What should I tell my health care provider before I take this medicine? They need to know if you have any of these conditions: -severe knee inflammation -skin conditions or sensitivity -skin or joint infection -venous stasis -an unusual or allergic reaction to hylan G-F 20, hyaluronan (sodium hyaluronate), eggs, other medicines, foods, dyes, or preservatives -pregnant or trying to get pregnant -breast-feeding How should I use this medicine? This medicine is for injection into the knee joint. It is given by a health care professional in a hospital or clinic setting. Talk to your pediatrician regarding the use of this medicine in children. This medicine is not approved for use in children. Overdosage: If you think you've taken too much of this medicine contact a poison control center or emergency room at once. Overdosage: If you think you have taken too much of this medicine contact a poison control center or emergency room at once. NOTE: This medicine is only for you. Do not share this medicine with others. What if I miss a dose? Keep appointments for follow-up doses as directed. For Synvisc, you will need weekly injections for 3 doses. It is important not to miss your dose. If you will receive Synvisc-One, then only 1 injection will be needed. Call your doctor or health care professional if you are unable to keep an appointment. What may interact with this medicine? Do not take this medicine with any of the following medications: -other injections for the joint like steroids or anesthetics -certain skin disinfectants like benzalkonium chloride This list may not describe all possible  interactions. Give your health care provider a list of all the medicines, herbs, non-prescription drugs, or dietary supplements you use. Also tell them if you smoke, drink alcohol, or use illegal drugs. Some items may interact with your medicine. What should I watch for while using this medicine? Tell your doctor or healthcare professional if your symptoms do not start to get better or if they get worse. Your condition will be monitored carefully while you are receiving this medicine. Most persons get pain relief for up to 6 months after treatment. Avoid strenuous activities (high-impact sports, jogging) or major weight-bearing activities for 48 hours after the injection. What side effects may I notice from receiving this medicine? Side effects that you should report to your doctor or health care professional as soon as possible: -allergic reactions like skin rash, itching or hives, swelling of the face, lips, or tongue -difficulty breathing -fever or chills -severe joint pain or swelling -unusual bleeding or bruising  Side effects that usually do not require medical attention (Report these to your doctor or health care professional if they continue or are bothersome.): -dizziness -flushing -general ill feeling or flu-like symptoms -headache -minor joint pain or swelling -muscle pain or cramps -pain, redness, irritation or bruising at site of injection This list may not describe all possible side effects. Call your doctor for medical advice about side effects. You may report side effects to FDA at 1-800-FDA-1088. Where should I keep my medicine? This drug is given in a hospital or clinic and will not be stored at home. NOTE: This sheet is a summary. It may not cover all possible   information. If you have questions about this medicine, talk to your doctor, pharmacist, or health care provider.  2013, [employer]. (12/19/2009 4:39:59 PM)  

## 2012-11-08 DIAGNOSIS — K5900 Constipation, unspecified: Secondary | ICD-10-CM | POA: Insufficient documentation

## 2012-11-14 ENCOUNTER — Ambulatory Visit: Payer: Medicare Other | Admitting: Physical Medicine & Rehabilitation

## 2012-12-22 ENCOUNTER — Ambulatory Visit: Payer: Medicare Other | Admitting: Physical Medicine & Rehabilitation

## 2013-01-19 ENCOUNTER — Ambulatory Visit: Payer: Medicare Other | Admitting: Physical Medicine & Rehabilitation

## 2013-01-19 ENCOUNTER — Encounter: Payer: Medicare Other | Attending: Physical Medicine & Rehabilitation

## 2013-01-19 DIAGNOSIS — M25569 Pain in unspecified knee: Secondary | ICD-10-CM | POA: Insufficient documentation

## 2013-07-10 DIAGNOSIS — D352 Benign neoplasm of pituitary gland: Secondary | ICD-10-CM | POA: Insufficient documentation

## 2013-07-10 DIAGNOSIS — D353 Benign neoplasm of craniopharyngeal duct: Secondary | ICD-10-CM

## 2013-09-03 ENCOUNTER — Emergency Department (HOSPITAL_COMMUNITY)
Admission: EM | Admit: 2013-09-03 | Discharge: 2013-09-04 | Disposition: A | Discharge status: Home / Self Care | Payer: Medicare Other | Attending: Emergency Medicine | Admitting: Emergency Medicine

## 2013-09-03 ENCOUNTER — Encounter (HOSPITAL_COMMUNITY): Payer: Self-pay | Admitting: Emergency Medicine

## 2013-09-03 DIAGNOSIS — K529 Noninfective gastroenteritis and colitis, unspecified: Secondary | ICD-10-CM

## 2013-09-03 DIAGNOSIS — IMO0002 Reserved for concepts with insufficient information to code with codable children: Secondary | ICD-10-CM | POA: Insufficient documentation

## 2013-09-03 DIAGNOSIS — M25579 Pain in unspecified ankle and joints of unspecified foot: Secondary | ICD-10-CM | POA: Diagnosis not present

## 2013-09-03 DIAGNOSIS — J449 Chronic obstructive pulmonary disease, unspecified: Secondary | ICD-10-CM | POA: Diagnosis not present

## 2013-09-03 DIAGNOSIS — I499 Cardiac arrhythmia, unspecified: Secondary | ICD-10-CM | POA: Diagnosis not present

## 2013-09-03 DIAGNOSIS — J4489 Other specified chronic obstructive pulmonary disease: Secondary | ICD-10-CM | POA: Insufficient documentation

## 2013-09-03 DIAGNOSIS — IMO0001 Reserved for inherently not codable concepts without codable children: Secondary | ICD-10-CM | POA: Diagnosis not present

## 2013-09-03 DIAGNOSIS — F17200 Nicotine dependence, unspecified, uncomplicated: Secondary | ICD-10-CM | POA: Diagnosis not present

## 2013-09-03 DIAGNOSIS — Z794 Long term (current) use of insulin: Secondary | ICD-10-CM | POA: Insufficient documentation

## 2013-09-03 DIAGNOSIS — I10 Essential (primary) hypertension: Secondary | ICD-10-CM | POA: Diagnosis not present

## 2013-09-03 DIAGNOSIS — Z7902 Long term (current) use of antithrombotics/antiplatelets: Secondary | ICD-10-CM | POA: Diagnosis not present

## 2013-09-03 DIAGNOSIS — K5289 Other specified noninfective gastroenteritis and colitis: Secondary | ICD-10-CM | POA: Insufficient documentation

## 2013-09-03 DIAGNOSIS — R1084 Generalized abdominal pain: Secondary | ICD-10-CM | POA: Diagnosis present

## 2013-09-03 DIAGNOSIS — Z79899 Other long term (current) drug therapy: Secondary | ICD-10-CM | POA: Insufficient documentation

## 2013-09-03 DIAGNOSIS — E119 Type 2 diabetes mellitus without complications: Secondary | ICD-10-CM | POA: Diagnosis not present

## 2013-09-03 HISTORY — DX: Fibromyalgia: M79.7

## 2013-09-03 HISTORY — DX: Cardiac arrhythmia, unspecified: I49.9

## 2013-09-03 HISTORY — DX: Neoplasm of unspecified behavior of endocrine glands and other parts of nervous system: D49.7

## 2013-09-03 LAB — CBC WITH DIFFERENTIAL/PLATELET
Basophils Absolute: 0.1 10*3/uL (ref 0.0–0.1)
Basophils Relative: 1 % (ref 0–1)
EOS PCT: 2 % (ref 0–5)
Eosinophils Absolute: 0.2 10*3/uL (ref 0.0–0.7)
HEMATOCRIT: 33.4 % — AB (ref 36.0–46.0)
HEMOGLOBIN: 10.8 g/dL — AB (ref 12.0–15.0)
LYMPHS ABS: 4.3 10*3/uL — AB (ref 0.7–4.0)
LYMPHS PCT: 40 % (ref 12–46)
MCH: 29.6 pg (ref 26.0–34.0)
MCHC: 32.3 g/dL (ref 30.0–36.0)
MCV: 91.5 fL (ref 78.0–100.0)
MONO ABS: 0.6 10*3/uL (ref 0.1–1.0)
Monocytes Relative: 5 % (ref 3–12)
Neutro Abs: 5.6 10*3/uL (ref 1.7–7.7)
Neutrophils Relative %: 53 % (ref 43–77)
PLATELETS: 310 10*3/uL (ref 150–400)
RBC: 3.65 MIL/uL — AB (ref 3.87–5.11)
RDW: 15 % (ref 11.5–15.5)
WBC: 10.6 10*3/uL — AB (ref 4.0–10.5)

## 2013-09-03 NOTE — ED Notes (Signed)
Pt reports that she has has abdominal pain for the past 3 days, began vomiting today, reports diarrhea as well today. Pt states that when she awoke, her R ankle was hurting making it difficult to walk. Pt states that her granddaughter also began vomiting today. Pt a&o x4.

## 2013-09-04 LAB — URINALYSIS, ROUTINE W REFLEX MICROSCOPIC
BILIRUBIN URINE: NEGATIVE
Glucose, UA: NEGATIVE mg/dL
KETONES UR: NEGATIVE mg/dL
Leukocytes, UA: NEGATIVE
Nitrite: NEGATIVE
PROTEIN: NEGATIVE mg/dL
Specific Gravity, Urine: 1.017 (ref 1.005–1.030)
UROBILINOGEN UA: 1 mg/dL (ref 0.0–1.0)
pH: 5 (ref 5.0–8.0)

## 2013-09-04 LAB — COMPREHENSIVE METABOLIC PANEL
ALT: 7 U/L (ref 0–35)
AST: 11 U/L (ref 0–37)
Albumin: 3.3 g/dL — ABNORMAL LOW (ref 3.5–5.2)
Alkaline Phosphatase: 64 U/L (ref 39–117)
BUN: 16 mg/dL (ref 6–23)
CALCIUM: 9.7 mg/dL (ref 8.4–10.5)
CO2: 28 meq/L (ref 19–32)
Chloride: 101 mEq/L (ref 96–112)
Creatinine, Ser: 1.11 mg/dL — ABNORMAL HIGH (ref 0.50–1.10)
GFR, EST AFRICAN AMERICAN: 61 mL/min — AB (ref >90)
GFR, EST NON AFRICAN AMERICAN: 52 mL/min — AB (ref >90)
GLUCOSE: 64 mg/dL — AB (ref 70–99)
Potassium: 3.9 mEq/L (ref 3.7–5.3)
SODIUM: 142 meq/L (ref 137–147)
Total Bilirubin: 0.2 mg/dL — ABNORMAL LOW (ref 0.3–1.2)
Total Protein: 7.8 g/dL (ref 6.0–8.3)

## 2013-09-04 LAB — URINE MICROSCOPIC-ADD ON

## 2013-09-04 LAB — LIPASE, BLOOD: Lipase: 19 U/L (ref 11–59)

## 2013-09-04 MED ORDER — KETOROLAC TROMETHAMINE 30 MG/ML IJ SOLN
30.0000 mg | Freq: Once | INTRAMUSCULAR | Status: AC
  Administered 2013-09-04: 30 mg via INTRAVENOUS
  Filled 2013-09-04: qty 1

## 2013-09-04 MED ORDER — MORPHINE SULFATE 4 MG/ML IJ SOLN
4.0000 mg | Freq: Once | INTRAMUSCULAR | Status: AC
  Administered 2013-09-04: 4 mg via INTRAVENOUS
  Filled 2013-09-04: qty 1

## 2013-09-04 MED ORDER — ONDANSETRON HCL 4 MG PO TABS: 4.0000 mg | ORAL_TABLET | Freq: Four times a day (QID) | ORAL | Status: DC

## 2013-09-04 MED ORDER — SODIUM CHLORIDE 0.9 % IV BOLUS (SEPSIS)
500.0000 mL | Freq: Once | INTRAVENOUS | Status: AC
Start: 2013-09-04 — End: 2013-09-04
  Administered 2013-09-04: 500 mL via INTRAVENOUS

## 2013-09-04 MED ORDER — ONDANSETRON HCL 4 MG/2ML IJ SOLN
4.0000 mg | Freq: Once | INTRAMUSCULAR | Status: AC
  Administered 2013-09-04: 4 mg via INTRAVENOUS
  Filled 2013-09-04: qty 2

## 2013-09-04 NOTE — ED Provider Notes (Signed)
CSN: [id number]     Arrival date & time 09/03/13  2158 History   First MD Initiated Contact with Patient 09/04/13 0100     Chief Complaint  Patient presents with  . Abdominal Pain  . Emesis     (Consider location/radiation/quality/duration/timing/severity/associated sxs/prior Treatment) Patient is a 63 y.o. female presenting with abdominal pain and vomiting. The history is provided by the patient.  Abdominal Pain Pain location:  Generalized Pain quality: cramping   Pain radiates to:  Does not radiate Pain severity:  Moderate Onset quality:  Gradual Duration:  3 days Timing:  Constant Progression:  Worsening Chronicity:  New Context: retching   Context: not trauma   Relieved by:  Nothing Worsened by:  Eating Ineffective treatments:  None tried Associated symptoms: diarrhea, fever, nausea and vomiting   Associated symptoms: no constipation, no dysuria and no shortness of breath   Diarrhea:    Quality:  Semi-solid   Severity:  Mild   Duration:  1 day   Progression:  Resolved Fever:    Duration:  2 days   Timing:  Intermittent   Temp source:  Subjective   Progression:  Resolved Nausea:    Severity:  Moderate   Onset quality:  Gradual   Duration:  3 days   Timing:  Intermittent   Progression:  Unchanged Emesis Associated symptoms: abdominal pain, arthralgias and diarrhea   Associated symptoms: no headaches     Past Medical History  Diagnosis Date  . Diabetes mellitus   . Hypertension   . COPD (chronic obstructive pulmonary disease)   . Pituitary tumor   . Irregular heart beat   . Fibromyalgia    Past Surgical History  Procedure Laterality Date  . Back surgery    . Cardiac catheterization    . Cesarean section      x 3  . Tonsillectomy    . Appendectomy    . Colonoscopy    . Stent placed Left 06/28/12    in left groin   Family History  Problem Relation Age of Onset  . Diabetes Mother   . Cancer Father   . Kidney disease Brother    History   Substance Use Topics  . Smoking status: Current Every Day Smoker -- 0.30 packs/day for 30 years    Types: Cigarettes    Start date: 06/20/2000  . Smokeless tobacco: Never Used  . Alcohol Use: Not on file   OB History   Grav Para Term Preterm Abortions TAB SAB Ect Mult Living                 Review of Systems  Constitutional: Positive for fever.  Respiratory: Negative for shortness of breath.   Gastrointestinal: Positive for nausea, vomiting, abdominal pain and diarrhea. Negative for constipation.  Genitourinary: Negative for dysuria.  Musculoskeletal: Positive for arthralgias. Negative for joint swelling.  Skin: Negative for rash and wound.  Neurological: Negative for dizziness and headaches.  All other systems reviewed and are negative.     Allergies  Review of patient's allergies indicates no known allergies.  Home Medications   Prior to Admission medications   Medication Sig Start Date End Date Taking? Authorizing Provider  atorvastatin (LIPITOR) 80 MG tablet Take 80 mg by mouth daily.   Yes Historical Provider, MD  budesonide-formoterol (SYMBICORT) 160-4.5 MCG/ACT inhaler Inhale 2 puffs into the lungs 2 (two) times daily.   Yes Historical Provider, MD  cabergoline (DOSTINEX) 0.5 MG tablet Take 0.25 mg by mouth every 7 (seven)  days. Tuesdays.   Yes Historical Provider, MD  clonazePAM (KLONOPIN) 0.5 MG tablet Take 0.5 mg by mouth daily. 08/16/13  Yes Historical Provider, MD  clopidogrel (PLAVIX) 75 MG tablet Take 1 tablet (75 mg total) by mouth daily. 06/28/12  Yes Laverda Page, MD  famotidine (PEPCID) 20 MG tablet Take 20 mg by mouth 2 (two) times daily.   Yes Historical Provider, MD  gabapentin (NEURONTIN) 300 MG capsule Take 1 capsule (300 mg total) by mouth 3 (three) times daily. 07/04/12  Yes Charlett Blake, MD  ibuprofen (ADVIL,MOTRIN) 800 MG tablet Take 1 tablet by mouth every 8 (eight) hours as needed (for pain).  09/10/12  Yes Historical Provider, MD  insulin  NPH-insulin regular (NOVOLIN 70/30) (70-30) 100 UNIT/ML injection Inject 20-22 Units into the skin 2 (two) times daily before a meal. 22 units in the morning and 20 units in the evening   Yes Historical Provider, MD  lisinopril-hydrochlorothiazide (PRINZIDE,ZESTORETIC) 20-12.5 MG per tablet Take 1 tablet by mouth daily.    Yes Historical Provider, MD  pregabalin (LYRICA) 150 MG capsule Take 150 mg by mouth 2 (two) times daily.    Yes Historical Provider, MD  PROAIR HFA 108 (90 BASE) MCG/ACT inhaler Inhale 2 puffs into the lungs 3 (three) times daily as needed for shortness of breath.  09/10/12  Yes Historical Provider, MD  tiZANidine (ZANAFLEX) 2 MG tablet Take 2 mg by mouth every 8 (eight) hours.   Yes Historical Provider, MD  traMADol (ULTRAM) 50 MG tablet Take 50 mg by mouth 3 (three) times daily.    Yes Historical Provider, MD  Vitamin D, Ergocalciferol, (DRISDOL) 50000 UNITS CAPS capsule Take 50,000 Units by mouth every 7 (seven) days. Tuesday   Yes Historical Provider, MD  ZETIA 10 MG tablet Take 1 tablet by mouth daily. 09/17/12  Yes Historical Provider, MD  zolpidem (AMBIEN) 5 MG tablet Take 5 mg by mouth at bedtime as needed for sleep. For sleep   Yes Historical Provider, MD   BP 134/50  Pulse 98  Temp(Src) 98.7 F (37.1 C) (Oral)  Resp 20  SpO2 95% Physical Exam  Nursing note and vitals reviewed. Constitutional: She is oriented to person, place, and time. She appears well-developed and well-nourished.  HENT:  Head: Normocephalic.  Eyes: Pupils are equal, round, and reactive to light.  Neck: Normal range of motion.  Cardiovascular: Normal rate and regular rhythm.   Pulmonary/Chest: Breath sounds normal.  Abdominal: Soft. Bowel sounds are normal. She exhibits no distension. There is generalized tenderness. There is no rebound and no guarding.  Musculoskeletal: Normal range of motion. She exhibits tenderness.       Right ankle: She exhibits normal range of motion, no swelling, no  ecchymosis, no deformity, no laceration and normal pulse. Tenderness. Lateral malleolus and medial malleolus tenderness found.  Neurological: She is alert and oriented to person, place, and time.  Skin: Skin is warm. No erythema.    ED Course  Procedures (including critical care time) Labs Review Labs Reviewed  CBC WITH DIFFERENTIAL - Abnormal; Notable for the following:    WBC 10.6 (*)    RBC 3.65 (*)    Hemoglobin 10.8 (*)    HCT 33.4 (*)    Lymphs Abs 4.3 (*)    All other components within normal limits  COMPREHENSIVE METABOLIC PANEL - Abnormal; Notable for the following:    Glucose, Bld 64 (*)    Creatinine, Ser 1.11 (*)    Albumin 3.3 (*)  Total Bilirubin 0.2 (*)    GFR calc non Af Amer 52 (*)    GFR calc Af Amer 61 (*)    All other components within normal limits  URINALYSIS, ROUTINE W REFLEX MICROSCOPIC - Abnormal; Notable for the following:    Hgb urine dipstick TRACE (*)    All other components within normal limits  URINE MICROSCOPIC-ADD ON - Abnormal; Notable for the following:    Squamous Epithelial / LPF FEW (*)    All other components within normal limits  LIPASE, BLOOD    Imaging Review No results found.   EKG Interpretation None      MDM  Patient's labs, urine, have been reviewed.  She was given IV fluids, antiemetics, and pain medication.  In the emergency department.  After this therapy.  She is feeling much better.  She is tolerating by mouth fluids.  She will be discharged home with a prescription for Zofran for any further episodes of nausea and vomiting.  She is to followup with her primary care physician.  If she develops new or worsening symptoms.  She can turn return to the emergency department for further evaluation.  At this time.  He did not feel a CT scan was warranted. As other family members have the same apparent.  A viral illness Final diagnoses:  Gastroenteritis         Garald Balding, NP 09/04/13 [phone]

## 2013-09-04 NOTE — Discharge Instructions (Signed)
In the emergency department tonight, your given IV fluids, antiemetics, and pain medication, U. been given a prescription for Zofran, and he can use for any recurrent episodes of nausea.  Please try to eat lightly with a bland diet for the next 24-48 hours.  Follow up with your primary care physician as needed.  Return to the emergency department if he developed new or worsening symptoms

## 2013-09-05 NOTE — ED Provider Notes (Signed)
Medical screening examination/treatment/procedure(s) were performed by non-physician practitioner and as supervising physician I was immediately available for consultation/collaboration.   EKG Interpretation None       Varney Biles, MD 09/05/13 [phone]

## 2013-09-06 DIAGNOSIS — Z6841 Body Mass Index (BMI) 40.0 and over, adult: Secondary | ICD-10-CM

## 2013-09-11 ENCOUNTER — Emergency Department (HOSPITAL_COMMUNITY)
Admission: EM | Admit: 2013-09-11 | Discharge: 2013-09-11 | Disposition: A | Discharge status: Home / Self Care | Payer: Medicare Other | Attending: Emergency Medicine | Admitting: Emergency Medicine

## 2013-09-11 ENCOUNTER — Encounter (HOSPITAL_COMMUNITY): Payer: Self-pay | Admitting: Emergency Medicine

## 2013-09-11 DIAGNOSIS — E119 Type 2 diabetes mellitus without complications: Secondary | ICD-10-CM | POA: Diagnosis not present

## 2013-09-11 DIAGNOSIS — J449 Chronic obstructive pulmonary disease, unspecified: Secondary | ICD-10-CM | POA: Insufficient documentation

## 2013-09-11 DIAGNOSIS — F17200 Nicotine dependence, unspecified, uncomplicated: Secondary | ICD-10-CM | POA: Diagnosis not present

## 2013-09-11 DIAGNOSIS — M25579 Pain in unspecified ankle and joints of unspecified foot: Secondary | ICD-10-CM | POA: Insufficient documentation

## 2013-09-11 DIAGNOSIS — I10 Essential (primary) hypertension: Secondary | ICD-10-CM | POA: Diagnosis not present

## 2013-09-11 DIAGNOSIS — Z794 Long term (current) use of insulin: Secondary | ICD-10-CM | POA: Insufficient documentation

## 2013-09-11 DIAGNOSIS — Z9889 Other specified postprocedural states: Secondary | ICD-10-CM | POA: Diagnosis not present

## 2013-09-11 DIAGNOSIS — J4489 Other specified chronic obstructive pulmonary disease: Secondary | ICD-10-CM | POA: Insufficient documentation

## 2013-09-11 DIAGNOSIS — Z7902 Long term (current) use of antithrombotics/antiplatelets: Secondary | ICD-10-CM | POA: Diagnosis not present

## 2013-09-11 DIAGNOSIS — M25571 Pain in right ankle and joints of right foot: Secondary | ICD-10-CM

## 2013-09-11 MED ORDER — HYDROCODONE-ACETAMINOPHEN 5-325 MG PO TABS: 1.0000 | ORAL_TABLET | Freq: Four times a day (QID) | ORAL | Status: DC | PRN

## 2013-09-11 MED ORDER — TRAMADOL HCL 50 MG PO TABS: 50.0000 mg | ORAL_TABLET | Freq: Three times a day (TID) | ORAL | Status: DC

## 2013-09-11 MED ORDER — HYDROCODONE-ACETAMINOPHEN 5-325 MG PO TABS
1.0000 | ORAL_TABLET | Freq: Once | ORAL | Status: AC
  Administered 2013-09-11: 1 via ORAL
  Filled 2013-09-11: qty 1

## 2013-09-11 NOTE — ED Provider Notes (Signed)
Medical screening examination/treatment/procedure(s) were performed by non-physician practitioner and as supervising physician I was immediately available for consultation/collaboration.   EKG Interpretation None        Delice Bison Merced Brougham, DO 09/11/13 2245

## 2013-09-11 NOTE — Discharge Instructions (Signed)
Please follow up closely with your doctor for further management of your ankle pain.  Wear brace and use crutches as needed.  Take pain medication as needed.   Ankle Pain Ankle pain is a common symptom. The bones, cartilage, tendons, and muscles of the ankle joint perform a lot of work each day. The ankle joint holds your body weight and allows you to move around. Ankle pain can occur on either side or back of 1 or both ankles. Ankle pain may be sharp and burning or dull and aching. There may be tenderness, stiffness, redness, or warmth around the ankle. The pain occurs more often when a person walks or puts pressure on the ankle. CAUSES  There are many reasons ankle pain can develop. It is important to work with your caregiver to identify the cause since many conditions can impact the bones, cartilage, muscles, and tendons. Causes for ankle pain include:  Injury, including a break (fracture), sprain, or strain often due to a fall, sports, or a high-impact activity.  Swelling (inflammation) of a tendon (tendonitis).  Achilles tendon rupture.  Ankle instability after repeated sprains and strains.  Poor foot alignment.  Pressure on a nerve (tarsal tunnel syndrome).  Arthritis in the ankle or the lining of the ankle.  Crystal formation in the ankle (gout or pseudogout). DIAGNOSIS  A diagnosis is based on your medical history, your symptoms, results of your physical exam, and results of diagnostic tests. Diagnostic tests may include X-ray exams or a computerized magnetic scan (magnetic resonance imaging, MRI). TREATMENT  Treatment will depend on the cause of your ankle pain and may include:  Keeping pressure off the ankle and limiting activities.  Using crutches or other walking support (a cane or brace).  Using rest, ice, compression, and elevation.  Participating in physical therapy or home exercises.  Wearing shoe inserts or special shoes.  Losing weight.  Taking medications to  reduce pain or swelling or receiving an injection.  Undergoing surgery. HOME CARE INSTRUCTIONS   Only take over-the-counter or prescription medicines for pain, discomfort, or fever as directed by your caregiver.  Put ice on the injured area.  Put ice in a plastic bag.  Place a towel between your skin and the bag.  Leave the ice on for 15-20 minutes at a time, 03-04 times a day.  Keep your leg raised (elevated) when possible to lessen swelling.  Avoid activities that cause ankle pain.  Follow specific exercises as directed by your caregiver.  Record how often you have ankle pain, the location of the pain, and what it feels like. This information may be helpful to you and your caregiver.  Ask your caregiver about returning to work or sports and whether you should drive.  Follow up with your caregiver for further examination, therapy, or testing as directed. SEEK MEDICAL CARE IF:   Pain or swelling continues or worsens beyond 1 week.  You have an oral temperature above 102 F (38.9 C).  You are feeling unwell or have chills.  You are having an increasingly difficult time with walking.  You have loss of sensation or other new symptoms.  You have questions or concerns. MAKE SURE YOU:   Understand these instructions.  Will watch your condition.  Will get help right away if you are not doing well or get worse. Document Released: 10/22/2009 Document Revised: 07/27/2011 Document Reviewed: 10/22/2009 [facility] Patient Information 2014 [place].

## 2013-09-11 NOTE — ED Provider Notes (Signed)
CSN: [id number]     Arrival date & time 09/11/13  1818 History  This chart was scribed for Domenic Moras by Lovena Le Day, ED scribe. This patient was seen in room WTR6/WTR6 and the patient's care was started at 8:05 Pm.    Chief Complaint  Patient presents with  . Ankle Pain  . Chest Pain   The history is provided by the patient. No language interpreter was used.   HPI Comments: Christina Solis is a 63 y.o. female who presents to the Emergency Department complaining of right ankle pain onset 10 days ago.  Denies any trauma to ankle states she woke up with the pain. States when she puts pressure on it radiates up into her leg. She states she was in the ED last Saturday 09/02/13 for stomach issues and ankle problem. Visited PCP and advised her to use RICE treatment. She states that the pain so severe that she was unable to ambulate. She denies numbness or similar symptoms. She denies any previous similar episodes. She states she has a h/o of fibromyalgia. She denies allergies to medicines.   Past Medical History  Diagnosis Date  . Diabetes mellitus   . Hypertension   . COPD (chronic obstructive pulmonary disease)   . Pituitary tumor   . Irregular heart beat   . Fibromyalgia    Past Surgical History  Procedure Laterality Date  . Back surgery    . Cardiac catheterization    . Cesarean section      x 3  . Tonsillectomy    . Appendectomy    . Colonoscopy    . Stent placed Left 06/28/12    in left groin   Family History  Problem Relation Age of Onset  . Diabetes Mother   . Cancer Father   . Kidney disease Brother    History  Substance Use Topics  . Smoking status: Current Every Day Smoker -- 0.30 packs/day for 30 years    Types: Cigarettes    Start date: 06/20/2000  . Smokeless tobacco: Never Used  . Alcohol Use: Not on file   OB History   Grav Para Term Preterm Abortions TAB SAB Ect Mult Living                 Review of Systems  Constitutional: Negative for fever and chills.   Respiratory: Negative for cough and shortness of breath.   Cardiovascular: Negative for chest pain.  Gastrointestinal: Negative for nausea and vomiting.  Musculoskeletal: Negative for back pain.       Right ankle pain  Skin: Negative for rash.  All other systems reviewed and are negative.     Allergies  Review of patient's allergies indicates no known allergies.  Home Medications   Prior to Admission medications   Medication Sig Start Date End Date Taking? Authorizing Provider  atorvastatin (LIPITOR) 80 MG tablet Take 80 mg by mouth daily.    Historical Provider, MD  budesonide-formoterol (SYMBICORT) 160-4.5 MCG/ACT inhaler Inhale 2 puffs into the lungs 2 (two) times daily.    Historical Provider, MD  cabergoline (DOSTINEX) 0.5 MG tablet Take 0.25 mg by mouth every 7 (seven) days. Tuesdays.    Historical Provider, MD  clonazePAM (KLONOPIN) 0.5 MG tablet Take 0.5 mg by mouth daily. 08/16/13   Historical Provider, MD  clopidogrel (PLAVIX) 75 MG tablet Take 1 tablet (75 mg total) by mouth daily. 06/28/12   Laverda Page, MD  famotidine (PEPCID) 20 MG tablet Take 20 mg by mouth 2 (  two) times daily.    Historical Provider, MD  gabapentin (NEURONTIN) 300 MG capsule Take 1 capsule (300 mg total) by mouth 3 (three) times daily. 07/04/12   Charlett Blake, MD  ibuprofen (ADVIL,MOTRIN) 800 MG tablet Take 1 tablet by mouth every 8 (eight) hours as needed (for pain).  09/10/12   Historical Provider, MD  insulin NPH-insulin regular (NOVOLIN 70/30) (70-30) 100 UNIT/ML injection Inject 20-22 Units into the skin 2 (two) times daily before a meal. 22 units in the morning and 20 units in the evening    Historical Provider, MD  lisinopril-hydrochlorothiazide (PRINZIDE,ZESTORETIC) 20-12.5 MG per tablet Take 1 tablet by mouth daily.     Historical Provider, MD  ondansetron (ZOFRAN) 4 MG tablet Take 1 tablet (4 mg total) by mouth every 6 (six) hours. 09/04/13   Garald Balding, NP  pregabalin (LYRICA) 150 MG  capsule Take 150 mg by mouth 2 (two) times daily.     Historical Provider, MD  PROAIR HFA 108 (90 BASE) MCG/ACT inhaler Inhale 2 puffs into the lungs 3 (three) times daily as needed for shortness of breath.  09/10/12   Historical Provider, MD  tiZANidine (ZANAFLEX) 2 MG tablet Take 2 mg by mouth every 8 (eight) hours.    Historical Provider, MD  traMADol (ULTRAM) 50 MG tablet Take 50 mg by mouth 3 (three) times daily.     Historical Provider, MD  Vitamin D, Ergocalciferol, (DRISDOL) 50000 UNITS CAPS capsule Take 50,000 Units by mouth every 7 (seven) days. Tuesday    Historical Provider, MD  ZETIA 10 MG tablet Take 1 tablet by mouth daily. 09/17/12   Historical Provider, MD  zolpidem (AMBIEN) 5 MG tablet Take 5 mg by mouth at bedtime as needed for sleep. For sleep    Historical Provider, MD   There were no vitals taken for this visit. Physical Exam  Nursing note and vitals reviewed. Constitutional: She is oriented to person, place, and time. She appears well-developed and well-nourished.  HENT:  Head: Normocephalic and atraumatic.  Eyes: EOM are normal.  Neck: Normal range of motion.  Cardiovascular: Normal rate.   Pulmonary/Chest: Effort normal.  Musculoskeletal: Normal range of motion. She exhibits tenderness. She exhibits no edema.  Right ankle tenderness noted to lateral and posterior malleoli. No deformity or swelling. Good DP.  Pain with dorsi flexion and plantar flexion Pain with inversion and eversion. No tenderness to calf.  No erythema, no edema.    Neurological: She is alert and oriented to person, place, and time.  Skin: Skin is warm and dry.  Psychiatric: She has a normal mood and affect. Her behavior is normal.    ED Course  Procedures (including critical care time) DIAGNOSTIC STUDIES: Oxygen Saturation is 96% on room air, normal by my interpretation.    COORDINATION OF CARE: At 815 PM Discussed treatment plan with patient which includes pain medicine, apply aso, crutches.  Patient agrees.   8:21 PM I have low suspicion for DVT septic arthritis , Neurovascular compromise, or acute emergent condition given the duration of her sxs and lack of objective finding on exam.  Doubt gout.  Pt currently on Plavix, therefore low risk for DVT, especially without edema or calf tenderness.  Pt to f/u with PCP for further care.    Labs Review Labs Reviewed - No data to display  Imaging Review No results found.   EKG Interpretation None      MDM   Final diagnoses:  Right ankle pain  BP 133/68  Temp(Src) 98.9 F (37.2 C) (Oral)  Resp 20  SpO2 96%  I personally performed the services described in this documentation, which was scribed in my presence. The recorded information has been reviewed and is accurate.     Domenic Moras, PA-C 09/11/13 2030

## 2013-09-11 NOTE — ED Notes (Signed)
Patient states that she was awakened by pain in her right ankle today. She says it is undescribable. She denies injury or trauma. She states that she had a sharp pain her right chest today. The pain is not sharp she denies radiation and/or jaw pain.

## 2013-09-11 NOTE — ED Notes (Signed)
Pt reports having chest pain while waiting in the waiting room.  Pt reports pain to be 7/10.  NAD noted or any diaphoresis.

## 2013-09-11 NOTE — ED Notes (Signed)
Denies chest pain at this time.

## 2013-09-13 ENCOUNTER — Telehealth (INDEPENDENT_AMBULATORY_CARE_PROVIDER_SITE_OTHER): Payer: Self-pay

## 2013-09-13 NOTE — Telephone Encounter (Signed)
Pt PCP office called to refer pt for GB consult. Pt also seen in ED on 4/19 and was symptomatic. Pt discharged with zofran. Pt symptoms have resolved at this time. Appt made for Friday with Dr Georgette Dover. Advised for pt to go back to ED if she becomes symptomatic again.

## 2013-09-14 ENCOUNTER — Encounter (INDEPENDENT_AMBULATORY_CARE_PROVIDER_SITE_OTHER): Payer: Self-pay | Admitting: Surgery

## 2013-09-14 DIAGNOSIS — R195 Other fecal abnormalities: Secondary | ICD-10-CM | POA: Insufficient documentation

## 2013-09-14 DIAGNOSIS — M719 Bursopathy, unspecified: Secondary | ICD-10-CM

## 2013-09-14 DIAGNOSIS — Z87891 Personal history of nicotine dependence: Secondary | ICD-10-CM | POA: Insufficient documentation

## 2013-09-14 DIAGNOSIS — M6790 Unspecified disorder of synovium and tendon, unspecified site: Secondary | ICD-10-CM | POA: Insufficient documentation

## 2013-09-15 ENCOUNTER — Ambulatory Visit (INDEPENDENT_AMBULATORY_CARE_PROVIDER_SITE_OTHER): Payer: Medicare Other | Admitting: Surgery

## 2013-09-15 ENCOUNTER — Encounter (INDEPENDENT_AMBULATORY_CARE_PROVIDER_SITE_OTHER): Payer: Self-pay | Admitting: General Surgery

## 2013-09-15 ENCOUNTER — Encounter (INDEPENDENT_AMBULATORY_CARE_PROVIDER_SITE_OTHER): Payer: Self-pay | Admitting: Surgery

## 2013-09-15 VITALS — BP 133/79 | HR 76 | Temp 98.6°F | Resp 14 | Ht 62.5 in | Wt 230.2 lb

## 2013-09-15 DIAGNOSIS — K8010 Calculus of gallbladder with chronic cholecystitis without obstruction: Secondary | ICD-10-CM

## 2013-09-15 NOTE — Progress Notes (Signed)
Patient ID: Christina Solis, female   DOB: June 02, 1950, 63 y.o.   MRN: [id number]  Chief Complaint  Patient presents with  . Abdominal Pain    gallbladder    HPI Christina Solis is a 63 y.o. female.  Referred by Eldridge Abrahams, NP at [facility] for evaluation of gallbladder disease Cardiologist - Dr. Adrian Prows   Abdominal Pain Associated symptoms: diarrhea, nausea and vomiting   Associated symptoms: no chest pain, no chills, no constipation, no cough, no fever, no hematuria, no sore throat and no vaginal bleeding    This is a 63 year old female who presents with several months of intermittent severe upper abdominal pain. This tends to occur after eating. The symptoms have become more severe and more frequent. The pain seems to be worse on her right side. There is some radiation around her back. She underwent evaluation which included a CT scan which showed cholelithiasis with some mild stranding around her gallbladder. She then underwent an ultrasound which showed a gallbladder that was filled with stones and sludge. The common bile duct is 7 mm. She is now referred for surgical evaluation. On 06/23/13 her white blood cell count was mildly elevated 11.2 and liver function tests were all normal with a bilirubin of 0.3.  Past Medical History  Diagnosis Date  . Diabetes mellitus   . Hypertension   . COPD (chronic obstructive pulmonary disease)   . Pituitary tumor   . Irregular heart beat   . Fibromyalgia   . Anemia   . Arthritis   . Anxiety   . CHF (congestive heart failure)   . Hyperlipidemia     Past Surgical History  Procedure Laterality Date  . Back surgery    . Cardiac catheterization    . Cesarean section      x 3  . Tonsillectomy    . Appendectomy    . Colonoscopy    . Stent placed Left 06/28/12    in left groin    Family History  Problem Relation Age of Onset  . Diabetes Mother   . Cancer Father   . Kidney disease Brother     Social History History   Substance Use Topics  . Smoking status: Current Every Day Smoker -- 0.30 packs/day for 30 years    Types: Cigarettes    Start date: 06/20/2000  . Smokeless tobacco: Never Used  . Alcohol Use: No    No Known Allergies  Current Outpatient Prescriptions  Medication Sig Dispense Refill  . aspirin 81 MG tablet Take 81 mg by mouth daily.      . cabergoline (DOSTINEX) 0.5 MG tablet Take 0.25 mg by mouth every 7 (seven) days. Tuesdays.      . clonazePAM (KLONOPIN) 0.5 MG tablet Take 0.5 mg by mouth daily.      Marland Kitchen dicyclomine (BENTYL) 20 MG tablet       . famotidine (PEPCID) 20 MG tablet Take 20 mg by mouth 2 (two) times daily.      Marland Kitchen gabapentin (NEURONTIN) 300 MG capsule Take 1 capsule (300 mg total) by mouth 3 (three) times daily.  90 capsule  5  . insulin NPH-insulin regular (NOVOLIN 70/30) (70-30) 100 UNIT/ML injection Inject 20-22 Units into the skin 2 (two) times daily before a meal. 22 units in the morning and 20 units in the evening      . Lancets (ONETOUCH ULTRASOFT) lancets       . lisinopril-hydrochlorothiazide (PRINZIDE,ZESTORETIC) 20-12.5 MG per tablet Take 1 tablet  by mouth daily.       Marland Kitchen NOVOLOG MIX 70/30 (70-30) 100 UNIT/ML injection       . ONETOUCH VERIO test strip       . pregabalin (LYRICA) 150 MG capsule Take 150 mg by mouth 2 (two) times daily.       Marland Kitchen PROAIR HFA 108 (90 BASE) MCG/ACT inhaler Inhale 2 puffs into the lungs 3 (three) times daily as needed for shortness of breath.       Marland Kitchen tiZANidine (ZANAFLEX) 2 MG tablet Take 2 mg by mouth every 8 (eight) hours.      . traMADol (ULTRAM) 50 MG tablet       . Vitamin D, Ergocalciferol, (DRISDOL) 50000 UNITS CAPS capsule Take 50,000 Units by mouth every 7 (seven) days. Tuesday      . ZETIA 10 MG tablet Take 1 tablet by mouth daily.      Marland Kitchen zolpidem (AMBIEN) 5 MG tablet Take 5 mg by mouth at bedtime as needed for sleep. For sleep       No current facility-administered medications for this visit.    Review of Systems Review  of Systems  Constitutional: Negative for fever, chills and unexpected weight change.  HENT: Negative for congestion, hearing loss, sore throat, trouble swallowing and voice change.   Eyes: Negative for visual disturbance.  Respiratory: Negative for cough and wheezing.   Cardiovascular: Negative for chest pain, palpitations and leg swelling.  Gastrointestinal: Positive for nausea, vomiting, abdominal pain, diarrhea and abdominal distention. Negative for constipation, blood in stool and anal bleeding.  Genitourinary: Negative for hematuria, vaginal bleeding and difficulty urinating.  Musculoskeletal: Negative for arthralgias.  Skin: Negative for rash and wound.  Neurological: Negative for seizures, syncope and headaches.  Hematological: Negative for adenopathy. Does not bruise/bleed easily.  Psychiatric/Behavioral: Negative for confusion.    Blood pressure 133/79, pulse 76, temperature 98.6 F (37 C), temperature source Temporal, resp. rate 14, height 5' 2.5" (1.588 m), weight 230 lb 3.2 oz (104.418 kg).  Physical Exam Physical Exam WDWN in NAD HEENT:  EOMI, sclera anicteric Neck:  No masses, no thyromegaly Lungs:  CTA bilaterally; normal respiratory effort CV:  Regular rate and rhythm; no murmurs Abd:  +bowel sounds, soft, mild RUQ tenderness; no palpable masses Ext:  Well-perfused; no edema Skin:  Warm, dry; no sign of jaundice  Data Reviewed U/S 4/29 at [facility] - Gallbladder filled with sludge and stones; no sonographic Murphy's sign.  No wall thickening.  Mildly dilated CBD.  07/03/13 - WBC 11.2, Hgb 12.1, Hct 36.7, Plts 335 Electrolytes/ LFT's WNL   Assessment    Chronic calculus cholecystitis     Plan     Cardiac clearance by Dr. Einar Gip. Laparoscopic cholecystectomy with intraoperative cholangiogram. The surgical procedure has been discussed with the patient.  Potential risks, benefits, alternative treatments, and expected outcomes have been explained.  All  of the patient's questions at this time have been answered.  The likelihood of reaching the patient's treatment goal is good.  The patient understand the proposed surgical procedure and wishes to proceed.         [name]. Jaquavious Mercer 09/15/2013, 12:12 PM

## 2013-09-22 ENCOUNTER — Telehealth (INDEPENDENT_AMBULATORY_CARE_PROVIDER_SITE_OTHER): Payer: Self-pay | Admitting: General Surgery

## 2013-09-22 ENCOUNTER — Encounter (INDEPENDENT_AMBULATORY_CARE_PROVIDER_SITE_OTHER): Payer: Self-pay

## 2013-09-22 NOTE — Telephone Encounter (Signed)
Called patient to let her know that she was cleared for surgery. And that she will need to stay on ASA thought the surgery. Per Dr. Einar Gip

## 2013-10-03 NOTE — Pre-Procedure Instructions (Signed)
Christina Solis  10/03/2013   Your procedure is scheduled on:  Thurs, May 28 @ 3:20 PM  Report to [facility]  at 1:15 PM.  Call this number if you have problems the morning of surgery: [phone]   Remember:   Do not eat food or drink liquids after midnight.   Take these medicines the morning of surgery with A SIP OF WATER: Clonazepam(Klonopin),Bentyl(Dicyclomine),Pepcid(Famotidine),Gabapentin(Neurontin),ProAir<Bring Your Inhaler With You>,Lyrica(Pregabalin),and Tramadol(Ultram-if needed)                Stop taking your Aspirin. No Goody's,BC's,Aleve,Ibuprofen,Fish Oil,or any Herbal Medications   Do not wear jewelry, make-up or nail polish.  Do not wear lotions, powders, or perfumes. You may wear deodorant.  Do not shave 48 hours prior to surgery.   Do not bring valuables to the hospital.  [facility] is not responsible                  for any belongings or valuables.               Contacts, dentures or bridgework may not be worn into surgery.  Leave suitcase in the car. After surgery it may be brought to your room.  For patients admitted to the hospital, discharge time is determined by your                treatment team.               Patients discharged the day of surgery will not be allowed to drive  home.    Special Instructions:  [place] - Preparing for Surgery  Before surgery, you can play an important role.  Because skin is not sterile, your skin needs to be as free of germs as possible.  You can reduce the number of germs on you skin by washing with CHG (chlorahexidine gluconate) soap before surgery.  CHG is an antiseptic cleaner which kills germs and bonds with the skin to continue killing germs even after washing.  Please DO NOT use if you have an allergy to CHG or antibacterial soaps.  If your skin becomes reddened/irritated stop using the CHG and inform your nurse when you arrive at Short Stay.  Do not shave (including legs and underarms) for at least 48  hours prior to the first CHG shower.  You may shave your face.  Please follow these instructions carefully:   1.  Shower with CHG Soap the night before surgery and the                                morning of Surgery.  2.  If you choose to wash your hair, wash your hair first as usual with your       normal shampoo.  3.  After you shampoo, rinse your hair and body thoroughly to remove the                      Shampoo.  4.  Use CHG as you would any other liquid soap.  You can apply chg directly       to the skin and wash gently with scrungie or a clean washcloth.  5.  Apply the CHG Soap to your body ONLY FROM THE NECK DOWN.        Do not use on open wounds or open sores.  Avoid contact with your eyes,  ears, mouth and genitals (private parts).  Wash genitals (private parts)       with your normal soap.  6.  Wash thoroughly, paying special attention to the area where your surgery        will be performed.  7.  Thoroughly rinse your body with warm water from the neck down.  8.  DO NOT shower/wash with your normal soap after using and rinsing off       the CHG Soap.  9.  Pat yourself dry with a clean towel.            10.  Wear clean pajamas.            11.  Place clean sheets on your bed the night of your first shower and do not        sleep with pets.  Day of Surgery  Do not apply any lotions/deoderants the morning of surgery.  Please wear clean clothes to the hospital/surgery center.     Please read over the following fact sheets that you were given: Pain Booklet, Coughing and Deep Breathing and Surgical Site Infection Prevention

## 2013-10-04 ENCOUNTER — Encounter (HOSPITAL_COMMUNITY): Payer: Self-pay | Admitting: Pharmacy Technician

## 2013-10-04 ENCOUNTER — Encounter (HOSPITAL_COMMUNITY)
Admission: RE | Admit: 2013-10-04 | Discharge: 2013-10-04 | Disposition: A | Discharge status: Home / Self Care | Payer: PRIVATE HEALTH INSURANCE | Source: Ambulatory Visit | Attending: Anesthesiology | Admitting: Anesthesiology

## 2013-10-04 ENCOUNTER — Encounter (HOSPITAL_COMMUNITY)
Admission: RE | Admit: 2013-10-04 | Discharge: 2013-10-04 | Disposition: A | Discharge status: Home / Self Care | Payer: PRIVATE HEALTH INSURANCE | Source: Ambulatory Visit | Attending: Surgery | Admitting: Surgery

## 2013-10-04 ENCOUNTER — Encounter (HOSPITAL_COMMUNITY): Payer: Self-pay

## 2013-10-04 DIAGNOSIS — Z01812 Encounter for preprocedural laboratory examination: Secondary | ICD-10-CM | POA: Insufficient documentation

## 2013-10-04 DIAGNOSIS — Z01818 Encounter for other preprocedural examination: Secondary | ICD-10-CM | POA: Insufficient documentation

## 2013-10-04 HISTORY — DX: Gastro-esophageal reflux disease without esophagitis: K21.9

## 2013-10-04 HISTORY — DX: Adverse effect of unspecified anesthetic, initial encounter: T41.45XA

## 2013-10-04 HISTORY — DX: Other complications of anesthesia, initial encounter: T88.59XA

## 2013-10-04 HISTORY — DX: Cardiac arrhythmia, unspecified: I49.9

## 2013-10-04 HISTORY — DX: Family history of other specified conditions: Z84.89

## 2013-10-04 LAB — CBC
HCT: 38.4 % (ref 36.0–46.0)
HEMOGLOBIN: 12.3 g/dL (ref 12.0–15.0)
MCH: 29.4 pg (ref 26.0–34.0)
MCHC: 32 g/dL (ref 30.0–36.0)
MCV: 91.9 fL (ref 78.0–100.0)
Platelets: 334 10*3/uL (ref 150–400)
RBC: 4.18 MIL/uL (ref 3.87–5.11)
RDW: 14.9 % (ref 11.5–15.5)
WBC: 9.6 10*3/uL (ref 4.0–10.5)

## 2013-10-04 LAB — BASIC METABOLIC PANEL
BUN: 21 mg/dL (ref 6–23)
CO2: 25 meq/L (ref 19–32)
Calcium: 10.1 mg/dL (ref 8.4–10.5)
Chloride: 105 mEq/L (ref 96–112)
Creatinine, Ser: 0.89 mg/dL (ref 0.50–1.10)
GFR calc Af Amer: 79 mL/min — ABNORMAL LOW (ref >90)
GFR, EST NON AFRICAN AMERICAN: 69 mL/min — AB (ref >90)
GLUCOSE: 133 mg/dL — AB (ref 70–99)
POTASSIUM: 4.1 meq/L (ref 3.7–5.3)
Sodium: 143 mEq/L (ref 137–147)

## 2013-10-04 MED ORDER — CHLORHEXIDINE GLUCONATE 4 % EX LIQD: 1.0000 "application " | Freq: Once | CUTANEOUS | Status: DC

## 2013-10-06 ENCOUNTER — Encounter (HOSPITAL_COMMUNITY): Payer: Self-pay

## 2013-10-06 NOTE — Progress Notes (Signed)
Anesthesia Chart Review:  Patient is a 63 year old female scheduled for laparoscopic cholecystectomy on 10/12/13 by Dr. Georgette Dover.  History includes smoking, diabetes mellitus type 2, PAD s/p left SFA PTA/stent 06/28/12, hypertension, COPD, fibromyalgia, arthritis, anxiety, GERD, hyperlipidemia, congestive heart failure, dysrhythmia/irregular HR (no afib), sickle cell trait, pituitary tumor (on cabergoline and followed at [facility] endocrinology), Cardiologist is Dr. Einar Gip who cleared patient for this procedure with permission to hold Plavix, but preferred that she continue ASA.   Cardiac cath on 06/28/12 showed: Normal coronary arteries, right dominant circulation. Proximal LAD showing spasm which relieved with intracoronary this administration. LVEF: 60% without regional wall motion abnormality.  Currently, her last echo on 05/19/12 showed:  Left ventricular cavity is normal in size. Moderate concentric hypertrophy. Normal global wall motion and systolic function. Visually EF is approximately 55%. Abnormal relaxation. Normal left atrial cavity size. 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 Trace mitral regurgitation. Mild tricuspid regurgitation. Mildly elevated PA systolic pressure of 36 mmHg. (Patient told her PAT RN that she is scheduled for an echo on 10/11/13 at Dr. Irven Shelling office, so I'll leave her chart for nurse follow-up.)  Nuclear stress test on 05/20/12 showed: The perfusion study demonstrated a moderate-sized very subtle inferior wall ischemia noted both in short and horizontal long axis views. Dynamic gated images revealed normal wall motion endocardial thickening. LVEF 54%. This represents a low risk study. In a patient with multiple CV risk consider further cardiac workup if clinically indicated. (She ultimately had a cardiac cath on 06/28/12 showing normal coronaries.)  EKG on  09/11/13 showed NSR with sinus arrhythmia.  CXR on 10/04/13 showed: Persistent prominence of the reticular  interstitial markings without focal abnormality. This may be due to the reported history of smoking and COPD.  Preoperative labs noted.    Dr. Einar Gip has already cleared patient.  I ask nursing staff to follow-up results (if available) on reportedly scheduled echo for 10/11/13 at [facility].  Of note, patient did feel that she will likely need to stay overnight.  I told her that she could discuss this with Dr. Georgette Dover on the day of surgery.   George Hugh [facility] Stay [facility]/Anesthesiology Phone [phone] 10/06/2013 1:11 PM

## 2013-10-10 ENCOUNTER — Encounter (INDEPENDENT_AMBULATORY_CARE_PROVIDER_SITE_OTHER): Payer: Self-pay

## 2013-10-11 MED ORDER — CEFAZOLIN SODIUM-DEXTROSE 2-3 GM-% IV SOLR
2.0000 g | INTRAVENOUS | Status: AC
  Administered 2013-10-12: 2 g via INTRAVENOUS
  Filled 2013-10-11: qty 50

## 2013-10-11 NOTE — Progress Notes (Signed)
Patient called to arrive at 1130

## 2013-10-12 ENCOUNTER — Encounter (HOSPITAL_COMMUNITY)
Admission: RE | Disposition: A | Discharge status: Home / Self Care | Payer: Self-pay | Source: Ambulatory Visit | Attending: Surgery

## 2013-10-12 ENCOUNTER — Observation Stay (HOSPITAL_COMMUNITY)
Admission: RE | Admit: 2013-10-12 | Discharge: 2013-10-13 | Disposition: A | Discharge status: Home / Self Care | Payer: PRIVATE HEALTH INSURANCE | Source: Ambulatory Visit | Attending: Surgery | Admitting: Surgery

## 2013-10-12 ENCOUNTER — Encounter (HOSPITAL_COMMUNITY): Payer: PRIVATE HEALTH INSURANCE | Admitting: Vascular Surgery

## 2013-10-12 ENCOUNTER — Ambulatory Visit (HOSPITAL_COMMUNITY): Payer: PRIVATE HEALTH INSURANCE | Admitting: Anesthesiology

## 2013-10-12 ENCOUNTER — Encounter (HOSPITAL_COMMUNITY): Payer: Self-pay | Admitting: *Deleted

## 2013-10-12 ENCOUNTER — Ambulatory Visit (HOSPITAL_COMMUNITY): Payer: PRIVATE HEALTH INSURANCE

## 2013-10-12 DIAGNOSIS — K8010 Calculus of gallbladder with chronic cholecystitis without obstruction: Secondary | ICD-10-CM

## 2013-10-12 DIAGNOSIS — J4489 Other specified chronic obstructive pulmonary disease: Secondary | ICD-10-CM | POA: Insufficient documentation

## 2013-10-12 DIAGNOSIS — F411 Generalized anxiety disorder: Secondary | ICD-10-CM | POA: Insufficient documentation

## 2013-10-12 DIAGNOSIS — I10 Essential (primary) hypertension: Secondary | ICD-10-CM | POA: Insufficient documentation

## 2013-10-12 DIAGNOSIS — D649 Anemia, unspecified: Secondary | ICD-10-CM | POA: Insufficient documentation

## 2013-10-12 DIAGNOSIS — J449 Chronic obstructive pulmonary disease, unspecified: Secondary | ICD-10-CM | POA: Insufficient documentation

## 2013-10-12 DIAGNOSIS — Z7982 Long term (current) use of aspirin: Secondary | ICD-10-CM | POA: Insufficient documentation

## 2013-10-12 DIAGNOSIS — E785 Hyperlipidemia, unspecified: Secondary | ICD-10-CM | POA: Insufficient documentation

## 2013-10-12 DIAGNOSIS — I509 Heart failure, unspecified: Secondary | ICD-10-CM | POA: Insufficient documentation

## 2013-10-12 DIAGNOSIS — Z794 Long term (current) use of insulin: Secondary | ICD-10-CM | POA: Insufficient documentation

## 2013-10-12 DIAGNOSIS — F17200 Nicotine dependence, unspecified, uncomplicated: Secondary | ICD-10-CM | POA: Insufficient documentation

## 2013-10-12 DIAGNOSIS — E119 Type 2 diabetes mellitus without complications: Secondary | ICD-10-CM | POA: Insufficient documentation

## 2013-10-12 HISTORY — DX: Migraine, unspecified, not intractable, without status migrainosus: G43.909

## 2013-10-12 HISTORY — DX: Pneumonia, unspecified organism: J18.9

## 2013-10-12 HISTORY — PX: LAPAROSCOPIC CHOLECYSTECTOMY: SUR755

## 2013-10-12 HISTORY — DX: Type 2 diabetes mellitus without complications: E11.9

## 2013-10-12 HISTORY — DX: Dorsalgia, unspecified: M54.9

## 2013-10-12 HISTORY — DX: Unspecified chronic bronchitis: J42

## 2013-10-12 HISTORY — DX: Other chronic pain: G89.29

## 2013-10-12 HISTORY — PX: CHOLECYSTECTOMY: SHX55

## 2013-10-12 LAB — GLUCOSE, CAPILLARY
Glucose-Capillary: 85 mg/dL (ref 70–99)
Glucose-Capillary: 77 mg/dL (ref 70–99)
Glucose-Capillary: 101 mg/dL — ABNORMAL HIGH (ref 70–99)
Glucose-Capillary: 108 mg/dL — ABNORMAL HIGH (ref 70–99)
Glucose-Capillary: 132 mg/dL — ABNORMAL HIGH (ref 70–99)
Glucose-Capillary: 127 mg/dL — ABNORMAL HIGH (ref 70–99)

## 2013-10-12 LAB — CBC
HCT: 35.1 % — ABNORMAL LOW (ref 36.0–46.0)
Hemoglobin: 11.2 g/dL — ABNORMAL LOW (ref 12.0–15.0)
MCH: 29.5 pg (ref 26.0–34.0)
MCHC: 31.9 g/dL (ref 30.0–36.0)
MCV: 92.4 fL (ref 78.0–100.0)
Platelets: 338 10*3/uL (ref 150–400)
RBC: 3.8 MIL/uL — AB (ref 3.87–5.11)
RDW: 15.3 % (ref 11.5–15.5)
WBC: 12 10*3/uL — AB (ref 4.0–10.5)

## 2013-10-12 LAB — CREATININE, SERUM
Creatinine, Ser: 0.92 mg/dL (ref 0.50–1.10)
GFR calc non Af Amer: 66 mL/min — ABNORMAL LOW (ref >90)
GFR, EST AFRICAN AMERICAN: 76 mL/min — AB (ref >90)

## 2013-10-12 SURGERY — LAPAROSCOPIC CHOLECYSTECTOMY WITH INTRAOPERATIVE CHOLANGIOGRAM
Anesthesia: General | Site: Abdomen

## 2013-10-12 MED ORDER — TIZANIDINE HCL 2 MG PO TABS
2.0000 mg | ORAL_TABLET | Freq: Three times a day (TID) | ORAL | Status: DC
Start: 2013-10-12 — End: 2013-10-13
  Administered 2013-10-12 – 2013-10-13 (×2): 2 mg via ORAL
  Filled 2013-10-12 (×5): qty 1

## 2013-10-12 MED ORDER — GLYCOPYRROLATE 0.2 MG/ML IJ SOLN
INTRAMUSCULAR | Status: DC | PRN
  Administered 2013-10-12: 0.4 mg via INTRAVENOUS

## 2013-10-12 MED ORDER — LISINOPRIL-HYDROCHLOROTHIAZIDE 20-12.5 MG PO TABS: 1.0000 | ORAL_TABLET | Freq: Every day | ORAL | Status: DC

## 2013-10-12 MED ORDER — LACTATED RINGERS IV SOLN
INTRAVENOUS | Status: DC
  Administered 2013-10-12: 12:00:00 via INTRAVENOUS

## 2013-10-12 MED ORDER — OXYCODONE-ACETAMINOPHEN 5-325 MG PO TABS
ORAL_TABLET | ORAL | Status: AC
  Administered 2013-10-12: 2 via ORAL
  Filled 2013-10-12: qty 2

## 2013-10-12 MED ORDER — FENTANYL CITRATE 0.05 MG/ML IJ SOLN
INTRAMUSCULAR | Status: DC | PRN
  Administered 2013-10-12 (×2): 50 ug via INTRAVENOUS
  Administered 2013-10-12: 100 ug via INTRAVENOUS
  Administered 2013-10-12: 50 ug via INTRAVENOUS

## 2013-10-12 MED ORDER — LIDOCAINE HCL (CARDIAC) 20 MG/ML IV SOLN
INTRAVENOUS | Status: DC | PRN
  Administered 2013-10-12: 100 mg via INTRAVENOUS

## 2013-10-12 MED ORDER — ROCURONIUM BROMIDE 100 MG/10ML IV SOLN
INTRAVENOUS | Status: DC | PRN
  Administered 2013-10-12: 30 mg via INTRAVENOUS
  Administered 2013-10-12: 10 mg via INTRAVENOUS

## 2013-10-12 MED ORDER — FENTANYL CITRATE 0.05 MG/ML IJ SOLN
INTRAMUSCULAR | Status: AC
  Filled 2013-10-12: qty 5

## 2013-10-12 MED ORDER — DEXTROSE 50 % IV SOLN
INTRAVENOUS | Status: AC
  Filled 2013-10-12: qty 50

## 2013-10-12 MED ORDER — ONDANSETRON HCL 4 MG/2ML IJ SOLN: 4.0000 mg | Freq: Four times a day (QID) | INTRAMUSCULAR | Status: DC | PRN

## 2013-10-12 MED ORDER — GABAPENTIN 300 MG PO CAPS
300.0000 mg | ORAL_CAPSULE | Freq: Three times a day (TID) | ORAL | Status: DC
  Administered 2013-10-12 – 2013-10-13 (×2): 300 mg via ORAL
  Filled 2013-10-12 (×4): qty 1

## 2013-10-12 MED ORDER — HYDROMORPHONE HCL PF 1 MG/ML IJ SOLN
INTRAMUSCULAR | Status: AC
  Administered 2013-10-12: 0.5 mg via INTRAVENOUS
  Filled 2013-10-12: qty 1

## 2013-10-12 MED ORDER — ONDANSETRON HCL 4 MG PO TABS: 4.0000 mg | ORAL_TABLET | Freq: Four times a day (QID) | ORAL | Status: DC | PRN

## 2013-10-12 MED ORDER — INSULIN ASPART 100 UNIT/ML ~~LOC~~ SOLN: 4.0000 [IU] | Freq: Three times a day (TID) | SUBCUTANEOUS | Status: DC

## 2013-10-12 MED ORDER — HYDROCHLOROTHIAZIDE 12.5 MG PO CAPS
12.5000 mg | ORAL_CAPSULE | Freq: Every day | ORAL | Status: DC
  Administered 2013-10-13: 12.5 mg via ORAL
  Filled 2013-10-12: qty 1

## 2013-10-12 MED ORDER — DEXTROSE 50 % IV SOLN
12.5000 g | Freq: Once | INTRAVENOUS | Status: AC
  Administered 2013-10-12: 12.5 g via INTRAVENOUS
  Filled 2013-10-12: qty 50

## 2013-10-12 MED ORDER — CLONAZEPAM 0.5 MG PO TABS
0.5000 mg | ORAL_TABLET | Freq: Every day | ORAL | Status: DC
  Administered 2013-10-13: 0.5 mg via ORAL
  Filled 2013-10-12: qty 1

## 2013-10-12 MED ORDER — LIDOCAINE HCL (CARDIAC) 20 MG/ML IV SOLN
INTRAVENOUS | Status: AC
  Filled 2013-10-12: qty 5

## 2013-10-12 MED ORDER — STERILE WATER FOR INJECTION IJ SOLN
INTRAMUSCULAR | Status: AC
  Filled 2013-10-12: qty 20

## 2013-10-12 MED ORDER — FAMOTIDINE 20 MG PO TABS
20.0000 mg | ORAL_TABLET | Freq: Two times a day (BID) | ORAL | Status: DC
  Administered 2013-10-12 – 2013-10-13 (×2): 20 mg via ORAL
  Filled 2013-10-12 (×3): qty 1

## 2013-10-12 MED ORDER — PROPOFOL 10 MG/ML IV BOLUS
INTRAVENOUS | Status: AC
  Filled 2013-10-12: qty 20

## 2013-10-12 MED ORDER — ONDANSETRON HCL 4 MG/2ML IJ SOLN
INTRAMUSCULAR | Status: DC | PRN
  Administered 2013-10-12: 4 mg via INTRAVENOUS

## 2013-10-12 MED ORDER — EZETIMIBE 10 MG PO TABS
10.0000 mg | ORAL_TABLET | Freq: Every day | ORAL | Status: DC
  Administered 2013-10-13: 10 mg via ORAL
  Filled 2013-10-12: qty 1

## 2013-10-12 MED ORDER — PROPOFOL 10 MG/ML IV BOLUS
INTRAVENOUS | Status: DC | PRN
  Administered 2013-10-12: 180 mg via INTRAVENOUS

## 2013-10-12 MED ORDER — POTASSIUM CHLORIDE IN NACL 20-0.9 MEQ/L-% IV SOLN
INTRAVENOUS | Status: DC
  Administered 2013-10-12: 22:00:00 via INTRAVENOUS
  Filled 2013-10-12 (×3): qty 1000

## 2013-10-12 MED ORDER — ONDANSETRON HCL 4 MG/2ML IJ SOLN
INTRAMUSCULAR | Status: AC
  Administered 2013-10-12: 4 mg via INTRAVENOUS
  Filled 2013-10-12: qty 2

## 2013-10-12 MED ORDER — ROCURONIUM BROMIDE 50 MG/5ML IV SOLN
INTRAVENOUS | Status: AC
  Filled 2013-10-12: qty 1

## 2013-10-12 MED ORDER — OXYCODONE-ACETAMINOPHEN 5-325 MG PO TABS
1.0000 | ORAL_TABLET | ORAL | Status: DC | PRN
  Administered 2013-10-12 – 2013-10-13 (×3): 2 via ORAL
  Filled 2013-10-12 (×2): qty 2

## 2013-10-12 MED ORDER — MIDAZOLAM HCL 5 MG/5ML IJ SOLN
INTRAMUSCULAR | Status: DC | PRN
  Administered 2013-10-12: 2 mg via INTRAVENOUS

## 2013-10-12 MED ORDER — SODIUM CHLORIDE 0.9 % IR SOLN
Status: DC | PRN
  Administered 2013-10-12: 1000 mL

## 2013-10-12 MED ORDER — ENOXAPARIN SODIUM 40 MG/0.4ML ~~LOC~~ SOLN
40.0000 mg | SUBCUTANEOUS | Status: DC
  Administered 2013-10-13: 40 mg via SUBCUTANEOUS
  Filled 2013-10-12 (×2): qty 0.4

## 2013-10-12 MED ORDER — LISINOPRIL 20 MG PO TABS
20.0000 mg | ORAL_TABLET | Freq: Every day | ORAL | Status: DC
Start: 2013-10-13 — End: 2013-10-13
  Administered 2013-10-13: 20 mg via ORAL
  Filled 2013-10-12: qty 1

## 2013-10-12 MED ORDER — SODIUM CHLORIDE 0.9 % IV SOLN
INTRAVENOUS | Status: DC | PRN
  Administered 2013-10-12: 15:00:00

## 2013-10-12 MED ORDER — BUPIVACAINE-EPINEPHRINE (PF) 0.25% -1:200000 IJ SOLN
INTRAMUSCULAR | Status: AC
  Filled 2013-10-12: qty 30

## 2013-10-12 MED ORDER — VECURONIUM BROMIDE 10 MG IV SOLR
INTRAVENOUS | Status: AC
  Filled 2013-10-12: qty 20

## 2013-10-12 MED ORDER — MIDAZOLAM HCL 2 MG/2ML IJ SOLN
INTRAMUSCULAR | Status: AC
  Filled 2013-10-12: qty 2

## 2013-10-12 MED ORDER — CEFAZOLIN SODIUM 1-5 GM-% IV SOLN
1.0000 g | Freq: Three times a day (TID) | INTRAVENOUS | Status: AC
  Administered 2013-10-12: 1 g via INTRAVENOUS
  Filled 2013-10-12: qty 50

## 2013-10-12 MED ORDER — BUDESONIDE-FORMOTEROL FUMARATE 160-4.5 MCG/ACT IN AERO
2.0000 | INHALATION_SPRAY | Freq: Two times a day (BID) | RESPIRATORY_TRACT | Status: DC
  Administered 2013-10-12 – 2013-10-13 (×2): 2 via RESPIRATORY_TRACT
  Filled 2013-10-12: qty 6

## 2013-10-12 MED ORDER — INSULIN ASPART 100 UNIT/ML ~~LOC~~ SOLN: 0.0000 [IU] | Freq: Three times a day (TID) | SUBCUTANEOUS | Status: DC

## 2013-10-12 MED ORDER — 0.9 % SODIUM CHLORIDE (POUR BTL) OPTIME
TOPICAL | Status: DC | PRN
  Administered 2013-10-12: 1000 mL

## 2013-10-12 MED ORDER — HYDROMORPHONE HCL PF 1 MG/ML IJ SOLN
0.2500 mg | INTRAMUSCULAR | Status: DC | PRN
  Administered 2013-10-12 (×4): 0.5 mg via INTRAVENOUS

## 2013-10-12 MED ORDER — ALBUTEROL SULFATE (2.5 MG/3ML) 0.083% IN NEBU: 3.0000 mL | INHALATION_SOLUTION | Freq: Three times a day (TID) | RESPIRATORY_TRACT | Status: DC | PRN

## 2013-10-12 MED ORDER — NEOSTIGMINE METHYLSULFATE 10 MG/10ML IV SOLN
INTRAVENOUS | Status: DC | PRN
  Administered 2013-10-12: 3 mg via INTRAVENOUS

## 2013-10-12 MED ORDER — HYDROMORPHONE HCL PF 1 MG/ML IJ SOLN
1.0000 mg | INTRAMUSCULAR | Status: DC | PRN
  Administered 2013-10-12 – 2013-10-13 (×2): 1 mg via INTRAVENOUS
  Filled 2013-10-12 (×2): qty 1

## 2013-10-12 MED ORDER — ONDANSETRON HCL 4 MG/2ML IJ SOLN
4.0000 mg | Freq: Once | INTRAMUSCULAR | Status: AC | PRN
  Administered 2013-10-12: 4 mg via INTRAVENOUS

## 2013-10-12 MED ORDER — BUPIVACAINE-EPINEPHRINE 0.25% -1:200000 IJ SOLN
INTRAMUSCULAR | Status: DC | PRN
  Administered 2013-10-12: 15 mL

## 2013-10-12 MED ORDER — PREGABALIN 75 MG PO CAPS
150.0000 mg | ORAL_CAPSULE | Freq: Two times a day (BID) | ORAL | Status: DC
  Administered 2013-10-12 – 2013-10-13 (×2): 150 mg via ORAL
  Filled 2013-10-12 (×2): qty 2

## 2013-10-12 SURGICAL SUPPLY — 46 items
APL SKNCLS STERI-STRIP NONHPOA (GAUZE/BANDAGES/DRESSINGS) ×1
APPLIER CLIP ROT 10 11.4 M/L (STAPLE) ×3
APR CLP MED LRG 11.4X10 (STAPLE) ×1
BAG SPEC RTRVL LRG 6X4 10 (ENDOMECHANICALS) ×1
BENZOIN TINCTURE PRP APPL 2/3 (GAUZE/BANDAGES/DRESSINGS) ×3 IMPLANT
BLADE SURG ROTATE 9660 (MISCELLANEOUS) IMPLANT
CANISTER SUCTION 2500CC (MISCELLANEOUS) ×3 IMPLANT
CHLORAPREP W/TINT 26ML (MISCELLANEOUS) ×3 IMPLANT
CLIP APPLIE ROT 10 11.4 M/L (STAPLE) ×1 IMPLANT
CLOSURE STERI-STRIP 1/2X4 (GAUZE/BANDAGES/DRESSINGS) ×1
CLSR STERI-STRIP ANTIMIC 1/2X4 (GAUZE/BANDAGES/DRESSINGS) ×1 IMPLANT
COVER MAYO STAND STRL (DRAPES) ×3 IMPLANT
COVER SURGICAL LIGHT HANDLE (MISCELLANEOUS) ×3 IMPLANT
DRAPE C-ARM 42X72 X-RAY (DRAPES) ×3 IMPLANT
DRAPE UTILITY 15X26 W/TAPE STR (DRAPE) ×6 IMPLANT
DRSG TEGADERM 2-3/8X2-3/4 SM (GAUZE/BANDAGES/DRESSINGS) ×9 IMPLANT
DRSG TEGADERM 4X4.75 (GAUZE/BANDAGES/DRESSINGS) ×3 IMPLANT
ELECT REM PT RETURN 9FT ADLT (ELECTROSURGICAL) ×3
ELECTRODE REM PT RTRN 9FT ADLT (ELECTROSURGICAL) ×1 IMPLANT
ENDOLOOP SUT PDS II  0 18 (SUTURE) ×4
ENDOLOOP SUT PDS II 0 18 (SUTURE) IMPLANT
FILTER SMOKE EVAC LAPAROSHD (FILTER) ×3 IMPLANT
GAUZE SPONGE 2X2 8PLY STRL LF (GAUZE/BANDAGES/DRESSINGS) ×1 IMPLANT
GLOVE BIO SURGEON STRL SZ7 (GLOVE) ×3 IMPLANT
GLOVE BIOGEL PI IND STRL 7.5 (GLOVE) ×1 IMPLANT
GLOVE BIOGEL PI INDICATOR 7.5 (GLOVE) ×2
GOWN STRL REUS W/ TWL LRG LVL3 (GOWN DISPOSABLE) ×4 IMPLANT
GOWN STRL REUS W/TWL LRG LVL3 (GOWN DISPOSABLE) ×12
KIT BASIN OR (CUSTOM PROCEDURE TRAY) ×3 IMPLANT
KIT ROOM TURNOVER OR (KITS) ×3 IMPLANT
NS IRRIG 1000ML POUR BTL (IV SOLUTION) ×3 IMPLANT
PAD ARMBOARD 7.5X6 YLW CONV (MISCELLANEOUS) ×3 IMPLANT
POUCH SPECIMEN RETRIEVAL 10MM (ENDOMECHANICALS) ×3 IMPLANT
SCISSORS LAP 5X35 DISP (ENDOMECHANICALS) ×3 IMPLANT
SET CHOLANGIOGRAPH 5 50 .035 (SET/KITS/TRAYS/PACK) ×3 IMPLANT
SET IRRIG TUBING LAPAROSCOPIC (IRRIGATION / IRRIGATOR) ×3 IMPLANT
SLEEVE ENDOPATH XCEL 5M (ENDOMECHANICALS) ×3 IMPLANT
SPECIMEN JAR SMALL (MISCELLANEOUS) ×3 IMPLANT
SPONGE GAUZE 2X2 STER 10/PKG (GAUZE/BANDAGES/DRESSINGS) ×2
SUT MNCRL AB 4-0 PS2 18 (SUTURE) ×3 IMPLANT
TOWEL OR 17X24 6PK STRL BLUE (TOWEL DISPOSABLE) ×3 IMPLANT
TOWEL OR 17X26 10 PK STRL BLUE (TOWEL DISPOSABLE) ×3 IMPLANT
TRAY LAPAROSCOPIC (CUSTOM PROCEDURE TRAY) ×3 IMPLANT
TROCAR XCEL BLUNT TIP 100MML (ENDOMECHANICALS) ×3 IMPLANT
TROCAR XCEL NON-BLD 11X100MML (ENDOMECHANICALS) ×3 IMPLANT
TROCAR XCEL NON-BLD 5MMX100MML (ENDOMECHANICALS) ×3 IMPLANT

## 2013-10-12 NOTE — Anesthesia Procedure Notes (Signed)
Procedure Name: Intubation Date/Time: 10/12/2013 2:40 PM Performed by: Rush Farmer E Pre-anesthesia Checklist: Patient identified, Emergency Drugs available, Suction available, Patient being monitored and Timeout performed Patient Re-evaluated:Patient Re-evaluated prior to inductionOxygen Delivery Method: Circle system utilized Preoxygenation: Pre-oxygenation with 100% oxygen Intubation Type: IV induction Ventilation: Mask ventilation without difficulty Laryngoscope Size: Mac and 3 Grade View: Grade II Tube type: Oral Tube size: 7.5 mm Number of attempts: 1 Airway Equipment and Method: Stylet Placement Confirmation: ETT inserted through vocal cords under direct vision,  positive ETCO2 and breath sounds checked- equal and bilateral Secured at: 21 cm Tube secured with: Tape Dental Injury: Teeth and Oropharynx as per pre-operative assessment

## 2013-10-12 NOTE — Interval H&P Note (Signed)
History and Physical Interval Note:  10/12/2013 1:39 PM  Christina Solis  has presented today for surgery, with the diagnosis of cholecystitis   The various methods of treatment have been discussed with the patient and family. After consideration of risks, benefits and other options for treatment, the patient has consented to  Procedure(s): LAPAROSCOPIC CHOLECYSTECTOMY WITH INTRAOPERATIVE CHOLANGIOGRAM (N/A) as a surgical intervention .  The patient's history has been reviewed, patient examined, no change in status, stable for surgery.  I have reviewed the patient's chart and labs.  Questions were answered to the patient's satisfaction.     [name]. Terez Freimark

## 2013-10-12 NOTE — H&P (View-Only) (Signed)
Patient ID: Christina Solis, female   DOB: 1950/08/17, 63 y.o.   MRN: [id number]  Chief Complaint  Patient presents with  . Abdominal Pain    gallbladder    HPI Christina Solis is a 63 y.o. female.  Referred by Eldridge Abrahams, NP at [facility] for evaluation of gallbladder disease Cardiologist - Dr. Adrian Prows   Abdominal Pain Associated symptoms: diarrhea, nausea and vomiting   Associated symptoms: no chest pain, no chills, no constipation, no cough, no fever, no hematuria, no sore throat and no vaginal bleeding    This is a 63 year old female who presents with several months of intermittent severe upper abdominal pain. This tends to occur after eating. The symptoms have become more severe and more frequent. The pain seems to be worse on her right side. There is some radiation around her back. She underwent evaluation which included a CT scan which showed cholelithiasis with some mild stranding around her gallbladder. She then underwent an ultrasound which showed a gallbladder that was filled with stones and sludge. The common bile duct is 7 mm. She is now referred for surgical evaluation. On 06/23/13 her white blood cell count was mildly elevated 11.2 and liver function tests were all normal with a bilirubin of 0.3.  Past Medical History  Diagnosis Date  . Diabetes mellitus   . Hypertension   . COPD (chronic obstructive pulmonary disease)   . Pituitary tumor   . Irregular heart beat   . Fibromyalgia   . Anemia   . Arthritis   . Anxiety   . CHF (congestive heart failure)   . Hyperlipidemia     Past Surgical History  Procedure Laterality Date  . Back surgery    . Cardiac catheterization    . Cesarean section      x 3  . Tonsillectomy    . Appendectomy    . Colonoscopy    . Stent placed Left 06/28/12    in left groin    Family History  Problem Relation Age of Onset  . Diabetes Mother   . Cancer Father   . Kidney disease Brother     Social History History   Substance Use Topics  . Smoking status: Current Every Day Smoker -- 0.30 packs/day for 30 years    Types: Cigarettes    Start date: 06/20/2000  . Smokeless tobacco: Never Used  . Alcohol Use: No    No Known Allergies  Current Outpatient Prescriptions  Medication Sig Dispense Refill  . aspirin 81 MG tablet Take 81 mg by mouth daily.      . cabergoline (DOSTINEX) 0.5 MG tablet Take 0.25 mg by mouth every 7 (seven) days. Tuesdays.      . clonazePAM (KLONOPIN) 0.5 MG tablet Take 0.5 mg by mouth daily.      Marland Kitchen dicyclomine (BENTYL) 20 MG tablet       . famotidine (PEPCID) 20 MG tablet Take 20 mg by mouth 2 (two) times daily.      Marland Kitchen gabapentin (NEURONTIN) 300 MG capsule Take 1 capsule (300 mg total) by mouth 3 (three) times daily.  90 capsule  5  . insulin NPH-insulin regular (NOVOLIN 70/30) (70-30) 100 UNIT/ML injection Inject 20-22 Units into the skin 2 (two) times daily before a meal. 22 units in the morning and 20 units in the evening      . Lancets (ONETOUCH ULTRASOFT) lancets       . lisinopril-hydrochlorothiazide (PRINZIDE,ZESTORETIC) 20-12.5 MG per tablet Take 1 tablet  by mouth daily.       Marland Kitchen NOVOLOG MIX 70/30 (70-30) 100 UNIT/ML injection       . ONETOUCH VERIO test strip       . pregabalin (LYRICA) 150 MG capsule Take 150 mg by mouth 2 (two) times daily.       Marland Kitchen PROAIR HFA 108 (90 BASE) MCG/ACT inhaler Inhale 2 puffs into the lungs 3 (three) times daily as needed for shortness of breath.       Marland Kitchen tiZANidine (ZANAFLEX) 2 MG tablet Take 2 mg by mouth every 8 (eight) hours.      . traMADol (ULTRAM) 50 MG tablet       . Vitamin D, Ergocalciferol, (DRISDOL) 50000 UNITS CAPS capsule Take 50,000 Units by mouth every 7 (seven) days. Tuesday      . ZETIA 10 MG tablet Take 1 tablet by mouth daily.      Marland Kitchen zolpidem (AMBIEN) 5 MG tablet Take 5 mg by mouth at bedtime as needed for sleep. For sleep       No current facility-administered medications for this visit.    Review of Systems Review  of Systems  Constitutional: Negative for fever, chills and unexpected weight change.  HENT: Negative for congestion, hearing loss, sore throat, trouble swallowing and voice change.   Eyes: Negative for visual disturbance.  Respiratory: Negative for cough and wheezing.   Cardiovascular: Negative for chest pain, palpitations and leg swelling.  Gastrointestinal: Positive for nausea, vomiting, abdominal pain, diarrhea and abdominal distention. Negative for constipation, blood in stool and anal bleeding.  Genitourinary: Negative for hematuria, vaginal bleeding and difficulty urinating.  Musculoskeletal: Negative for arthralgias.  Skin: Negative for rash and wound.  Neurological: Negative for seizures, syncope and headaches.  Hematological: Negative for adenopathy. Does not bruise/bleed easily.  Psychiatric/Behavioral: Negative for confusion.    Blood pressure 133/79, pulse 76, temperature 98.6 F (37 C), temperature source Temporal, resp. rate 14, height 5' 2.5" (1.588 m), weight 230 lb 3.2 oz (104.418 kg).  Physical Exam Physical Exam WDWN in NAD HEENT:  EOMI, sclera anicteric Neck:  No masses, no thyromegaly Lungs:  CTA bilaterally; normal respiratory effort CV:  Regular rate and rhythm; no murmurs Abd:  +bowel sounds, soft, mild RUQ tenderness; no palpable masses Ext:  Well-perfused; no edema Skin:  Warm, dry; no sign of jaundice  Data Reviewed U/S 4/29 at [facility] - Gallbladder filled with sludge and stones; no sonographic Murphy's sign.  No wall thickening.  Mildly dilated CBD.  07/03/13 - WBC 11.2, Hgb 12.1, Hct 36.7, Plts 335 Electrolytes/ LFT's WNL   Assessment    Chronic calculus cholecystitis     Plan     Cardiac clearance by Dr. Einar Gip. Laparoscopic cholecystectomy with intraoperative cholangiogram. The surgical procedure has been discussed with the patient.  Potential risks, benefits, alternative treatments, and expected outcomes have been explained.  All  of the patient's questions at this time have been answered.  The likelihood of reaching the patient's treatment goal is good.  The patient understand the proposed surgical procedure and wishes to proceed.         [name]. Imri Lor 09/15/2013, 12:12 PM

## 2013-10-12 NOTE — Anesthesia Postprocedure Evaluation (Signed)
Anesthesia Post Note  Patient: Christina Solis  Procedure(s) Performed: Procedure(s) (LRB): LAPAROSCOPIC CHOLECYSTECTOMY WITH INTRAOPERATIVE CHOLANGIOGRAM (N/A)  Anesthesia type: general  Patient location: PACU  Post pain: Pain level controlled  Post assessment: Patient's Cardiovascular Status Stable  Last Vitals:  Filed Vitals:   10/12/13 1207  BP: 122/50  Pulse: 57  Temp: 36.8 C  Resp: 18    Post vital signs: Reviewed and stable  Level of consciousness: sedated  Complications: No apparent anesthesia complications

## 2013-10-12 NOTE — Op Note (Signed)
Laparoscopic Cholecystectomy with IOC Procedure Note  Indications: This patient presents with symptomatic gallbladder disease and will undergo laparoscopic cholecystectomy.  Pre-operative Diagnosis: Calculus of gallbladder with other cholecystitis, without mention of obstruction  Post-operative Diagnosis: Same  Surgeon: [name]. Chritopher Coster   Assistants: Dr. Coralie Keens  Anesthesia: General endotracheal anesthesia  ASA Class: 2  Procedure Details  The patient was seen again in the Holding Room. The risks, benefits, complications, treatment options, and expected outcomes were discussed with the patient. The possibilities of reaction to medication, pulmonary aspiration, perforation of viscus, bleeding, recurrent infection, finding a normal gallbladder, the need for additional procedures, failure to diagnose a condition, the possible need to convert to an open procedure, and creating a complication requiring transfusion or operation were discussed with the patient. The likelihood of improving the patient's symptoms with return to their baseline status is good.  The patient and/or family concurred with the proposed plan, giving informed consent. The site of surgery properly noted. The patient was taken to Operating Room, identified as Christina Solis and the procedure verified as Laparoscopic Cholecystectomy with Intraoperative Cholangiogram. A Time Out was held and the above information confirmed.  Prior to the induction of general anesthesia, antibiotic prophylaxis was administered. General endotracheal anesthesia was then administered and tolerated well. After the induction, the abdomen was prepped with Chloraprep and draped in the sterile fashion. The patient was positioned in the supine position.  Local anesthetic agent was injected into the skin above the umbilicus and an incision made. We dissected down to the abdominal fascia with blunt dissection.  The fascia was incised vertically and we  entered the peritoneal cavity bluntly.  There are some omental adhesions around the umbilicus.  A pursestring suture of 0-Vicryl was placed around the fascial opening.  The Hasson cannula was inserted and secured with the stay suture.  Pneumoperitoneum was then created with CO2 and tolerated well without any adverse changes in the patient's vital signs.   We were able to visualize the abdominal wall in the right upper quadrant, but then midline had significant omental adhesions. Two 5-mm ports were placed in the right upper quadrant.  The camera was moved to the right side.  The adhesions around the umbilical port were taken down with scissors.   An 11-mm port was placed in the subxiphoid position.   All skin incisions were infiltrated with a local anesthetic agent before making the incision and placing the trocars.   We positioned the patient in reverse Trendelenburg, tilted slightly to the patient's left.  The gallbladder was identified, the fundus grasped and retracted cephalad.  The gallbladder is distended and very long. Adhesions were lysed bluntly and with the electrocautery where indicated, taking care not to injure any adjacent organs or viscus. The infundibulum was grasped and retracted laterally, exposing the peritoneum overlying the triangle of Calot. This was then divided and exposed in a blunt fashion. A critical view of the cystic duct and cystic artery was obtained.  The cystic duct was clearly identified and bluntly dissected circumferentially. The cystic duct was ligated with a clip distally.   An incision was made in the cystic duct and the [facility] cholangiogram catheter introduced. At first, there was a stone within the cystic duct, but we were able to crush this and milk the stone back through the ductotomy.  The catheter was secured using a clip. A cholangiogram was then obtained which showed good visualization of the distal and proximal biliary tree with no sign of  filling defects or  obstruction.  Contrast flowed easily into the duodenum. The catheter was then removed.   The cystic duct was then ligated with clips and divided. The cystic artery was identified, dissected free, ligated with clips and divided as well.   The gallbladder was dissected from the liver bed in retrograde fashion with the electrocautery. The gallbladder was removed and placed in an Endocatch sac. The liver bed was irrigated and inspected. Hemostasis was achieved with the electrocautery. Copious irrigation was utilized and was repeatedly aspirated until clear.  The gallbladder and Endocatch sac were then removed through the umbilical port site.  The pursestring suture was used to close the umbilical fascia.    We again inspected the right upper quadrant for hemostasis.  Pneumoperitoneum was released as we removed the trocars.  4-0 Monocryl was used to close the skin.   Benzoin, steri-strips, and clean dressings were applied. The patient was then extubated and brought to the recovery room in stable condition. Instrument, sponge, and needle counts were correct at closure and at the conclusion of the case.   Findings: Cholecystitis with Cholelithiasis  Estimated Blood Loss: less than 100 mL         Drains: none         Specimens: Gallbladder           Complications: None; patient tolerated the procedure well.         Disposition: PACU - hemodynamically stable.         Condition: stable  [name]. Georgette Dover, MD, [facility] Surgery  General/ Trauma Surgery  10/12/2013 4:17 PM

## 2013-10-12 NOTE — Anesthesia Preprocedure Evaluation (Signed)
Anesthesia Evaluation  Patient identified by MRN, date of birth, ID band Patient awake    Reviewed: Allergy & Precautions, H&P , NPO status , Patient's Chart, lab work & pertinent test results  Airway Mallampati: II TM Distance: >3 FB Neck ROM: Full    Dental  (+) Edentulous Upper, Edentulous Lower   Pulmonary Current Smoker,  breath sounds clear to auscultation        Cardiovascular hypertension, Rhythm:Regular Rate:Normal     Neuro/Psych    GI/Hepatic   Endo/Other  diabetes  Renal/GU      Musculoskeletal   Abdominal (+) + obese,   Peds  Hematology   Anesthesia Other Findings   Reproductive/Obstetrics                           Anesthesia Physical Anesthesia Plan  ASA: III  Anesthesia Plan: General   Post-op Pain Management:    Induction: Intravenous  Airway Management Planned: Oral ETT  Additional Equipment:   Intra-op Plan:   Post-operative Plan: Extubation in OR  Informed Consent: I have reviewed the patients History and Physical, chart, labs and discussed the procedure including the risks, benefits and alternatives for the proposed anesthesia with the patient or authorized representative who has indicated his/her understanding and acceptance.     Plan Discussed with: CRNA and Anesthesiologist  Anesthesia Plan Comments: (Cholelithiasis symptomatic  Type 2 DM glucose 85 Hypertension PVD S/P L SFA stent 2/14 on plavix off x14 days no claudication  Normal coronaries 2/14 by cathe normal LV function Fibromyalgia, anxiety  Plan GA with oral ETT  Roberts Gaudy)        Anesthesia Quick Evaluation

## 2013-10-12 NOTE — Transfer of Care (Signed)
Immediate Anesthesia Transfer of Care Note  Patient: Christina Solis  Procedure(s) Performed: Procedure(s): LAPAROSCOPIC CHOLECYSTECTOMY WITH INTRAOPERATIVE CHOLANGIOGRAM (N/A)  Patient Location: PACU  Anesthesia Type:General  Level of Consciousness: awake, alert  and oriented  Airway & Oxygen Therapy: Patient Spontanous Breathing  Post-op Assessment: Report given to PACU RN  Post vital signs: stable  Complications: No apparent anesthesia complications

## 2013-10-13 LAB — HEMOGLOBIN A1C
HEMOGLOBIN A1C: 6.9 % — AB (ref <5.7)
Mean Plasma Glucose: 151 mg/dL — ABNORMAL HIGH (ref <117)

## 2013-10-13 LAB — GLUCOSE, CAPILLARY: Glucose-Capillary: 98 mg/dL (ref 70–99)

## 2013-10-13 MED ORDER — OXYCODONE-ACETAMINOPHEN 5-325 MG PO TABS: 1.0000 | ORAL_TABLET | ORAL | Status: DC | PRN

## 2013-10-13 NOTE — Care Management Note (Signed)
    Page 1 of 1   10/13/2013     11:27:04 AM CARE MANAGEMENT NOTE 10/13/2013  Patient:  Christina Solis, Christina Solis   Account Number:  [id number]  Date Initiated:  10/13/2013  Documentation initiated by:  Tomi Bamberger  Subjective/Objective Assessment:   dx cholecystitis  admit as observation- from home.     Action/Plan:   Anticipated DC Date:  10/13/2013   Anticipated DC Plan:  HOME/SELF CARE      DC Planning Services  CM consult      Choice offered to / List presented to:             Status of service:  Completed, signed off Medicare Important Message given?   (If response is "NO", the following Medicare IM given date fields will be blank) Date Medicare IM given:   Date Additional Medicare IM given:    Discharge Disposition:  HOME/SELF CARE  Per UR Regulation:  Reviewed for med. necessity/level of care/duration of stay  If discussed at [place] of Stay Meetings, dates discussed:    Comments:  10/13/13 [place], BSN [phone] patient is for dc today, no NCM referral no needs anticipated. patient is observation IM n/a.

## 2013-10-13 NOTE — Discharge Summary (Signed)
Physician Discharge Summary  Patient ID: Christina Solis MRN: [id number] DOB/AGE: Oct 12, 1950 63 y.o.  Admit date: 10/12/2013 Discharge date: 10/13/2013  Admission Diagnoses:  Chronic calculus cholecystitis  Discharge Diagnoses: same Active Problems:   Chronic cholecystitis with calculus   Discharged Condition: good  Hospital Course: Laparoscopic cholecystectomy with intraoperative cholangiogram on 10/12/13.  Cholangiogram showed no sign of choledocholithiasis.  Patient requested to stay ovenight because "she knows her body".  Diet advanced and patient is ready for discharge today.  Consults: None  Significant Diagnostic Studies: none  Treatments: surgery: lap chole with IOC  Discharge Exam: Blood pressure 99/64, pulse 68, temperature 98.8 F (37.1 C), temperature source Oral, resp. rate 16, height 5' 2.5" (1.588 m), weight 224 lb (101.606 kg), SpO2 95.00%. General appearance: alert, cooperative and no distress GI: incisional tenderness Dressings c/d/i  Disposition: 01-Home or Self Care      Medication List    ASK your doctor about these medications       aspirin 81 MG tablet  Take 81 mg by mouth daily.     budesonide-formoterol 160-4.5 MCG/ACT inhaler  Commonly known as:  SYMBICORT  Inhale 2 puffs into the lungs 2 (two) times daily.     cabergoline 0.5 MG tablet  Commonly known as:  DOSTINEX  Take 0.25 mg by mouth every 7 (seven) days. Tuesdays.     clonazePAM 0.5 MG tablet  Commonly known as:  KLONOPIN  Take 0.5 mg by mouth daily.     dicyclomine 20 MG tablet  Commonly known as:  BENTYL  Take 20 mg by mouth 4 (four) times daily -  before meals and at bedtime.     famotidine 20 MG tablet  Commonly known as:  PEPCID  Take 20 mg by mouth 2 (two) times daily.     gabapentin 300 MG capsule  Commonly known as:  NEURONTIN  Take 1 capsule (300 mg total) by mouth 3 (three) times daily.     lisinopril-hydrochlorothiazide 20-12.5 MG per tablet  Commonly known as:   PRINZIDE,ZESTORETIC  Take 1 tablet by mouth daily.     NOVOLOG MIX 70/30 FLEXPEN [place]  Inject 20-22 Units into the skin 2 (two) times daily before a meal. 22 units in the morning and 20 units in the evening.     onetouch ultrasoft lancets     ONETOUCH VERIO test strip  Generic drug:  glucose blood     pregabalin 150 MG capsule  Commonly known as:  LYRICA  Take 150 mg by mouth 2 (two) times daily.     PROAIR HFA 108 (90 BASE) MCG/ACT inhaler  Generic drug:  albuterol  Inhale 2 puffs into the lungs 3 (three) times daily as needed for shortness of breath.     tiZANidine 2 MG tablet  Commonly known as:  ZANAFLEX  Take 2 mg by mouth every 8 (eight) hours.     traMADol 50 MG tablet  Commonly known as:  ULTRAM  Take 50 mg by mouth 3 (three) times daily as needed (pain).     Vitamin D (Ergocalciferol) 50000 UNITS Caps capsule  Commonly known as:  DRISDOL  Take 50,000 Units by mouth every 7 (seven) days. Tuesday     ZETIA 10 MG tablet  Generic drug:  ezetimibe  Take 1 tablet by mouth daily.     zolpidem 5 MG tablet  Commonly known as:  AMBIEN  Take 5 mg by mouth at bedtime as needed for sleep. For sleep  Percocet PRN for pain - take one pill every 4-6 hours as needed for pain.      Follow-up Information   Follow up with Kelty Szafran K., MD In 3 weeks.   Specialty:  General Surgery   Contact information:   [address] [place] [address] [phone]       Signed: [name]. Lousie Calico 10/13/2013, 5:57 AM

## 2013-10-13 NOTE — Progress Notes (Signed)
1 Day Post-Op  Subjective: Patient resting comfortably, but complains of pain when she is aroused. No nausea  Objective: Vital signs in last 24 hours: Temp:  [98.1 F (36.7 C)-98.8 F (37.1 C)] 98.8 F (37.1 C) (05/29 0338) Pulse Rate:  [57-79] 68 (05/29 0338) Resp:  [12-22] 16 (05/29 0338) BP: (80-142)/(44-77) 99/64 mmHg (05/29 0338) SpO2:  [95 %-100 %] 95 % (05/29 0338) Weight:  [224 lb (101.606 kg)] 224 lb (101.606 kg) (05/28 1207) Last BM Date: 10/10/13  Intake/Output from previous day: 05/28 0701 - 05/29 0700 In: 800 [I.V.:800] Out: -  Intake/Output this shift:    General appearance: alert, cooperative and no distress GI: obese, incisional tenderness Dressings c/d/i  Lab Results:   No new labs   Recent Labs  10/12/13 2212  WBC 12.0*  HGB 11.2*  HCT 35.1*  PLT 338   BMET  Recent Labs  10/12/13 2212  CREATININE 0.92   PT/INR No results found for this basename: LABPROT, INR,  in the last 72 hours ABG No results found for this basename: PHART, PCO2, PO2, HCO3,  in the last 72 hours  Studies/Results: Dg Cholangiogram Operative  10/12/2013   CLINICAL DATA:  Laparoscopic cholecystectomy  EXAM: INTRAOPERATIVE CHOLANGIOGRAM  FLUOROSCOPY TIME:  8 seconds  COMPARISON:  Abdominal ultrasound - 09/13/2013; CT abdomen pelvis - 09/11/2013  FINDINGS: Intraoperative angiographic images of the right upper abdominal quadrant during laparoscopic cholecystectomy are provided for review.  Surgical clips overlie the expected location of the gallbladder fossa.  Contrast injection demonstrates selective cannulation of the central aspect of the cystic duct.  There is passage of contrast through the central aspect of the cystic duct with filling of a non dilated common bile duct. There is passage of contrast though the CBD and into the descending portion of the duodenum.  There is minimal reflux of injected contrast into the common hepatic duct and central aspect of the non dilated  intrahepatic biliary system.  There are no discrete filling defects within the opacified portions of the biliary system to suggest the presence of choledocholithiasis.  An enteric tube tip and side port overlying the expected location of the gastric antrum.  IMPRESSION: No evidence of choledocholithiasis.   Electronically Signed   By: Sandi Mariscal M.D.   On: 10/12/2013 16:01    Anti-infectives: Anti-infectives   Start     Dose/Rate Route Frequency Ordered Stop   10/12/13 2200  ceFAZolin (ANCEF) IVPB 1 g/50 mL premix     1 g 100 mL/hr over 30 Minutes Intravenous 3 times per day 10/12/13 1935 10/12/13 2205   10/12/13 0600  ceFAZolin (ANCEF) IVPB 2 g/50 mL premix     2 g 100 mL/hr over 30 Minutes Intravenous On call to O.R. 10/11/13 1407 10/12/13 1500      Assessment/Plan: s/p Procedure(s): LAPAROSCOPIC CHOLECYSTECTOMY WITH INTRAOPERATIVE CHOLANGIOGRAM (N/A) Advance diet Discharge after tolerating diet.   LOS: 1 day    [name]. Oluwaseun Cremer 10/13/2013

## 2013-10-13 NOTE — Discharge Instructions (Signed)
[facility] °LAPAROSCOPIC SURGERY: POST OP INSTRUCTIONS °Always review your discharge instruction sheet given to you by the facility where your surgery was performed. °IF YOU HAVE DISABILITY OR FAMILY LEAVE FORMS, YOU MUST BRING THEM TO THE OFFICE FOR PROCESSING.   °DO NOT GIVE THEM TO YOUR DOCTOR. ° °1. A prescription for pain medication will be given to you upon discharge.  Take your pain medication as prescribed, if needed.  If narcotic pain medicine is not needed, then you may take acetaminophen (Tylenol) or ibuprofen (Advil) as needed. °2. Take your usually prescribed medications unless otherwise directed. °3. If you need a refill on your pain medication, please contact your pharmacy.  They will contact our office to request authorization. Prescriptions will not be filled after 5pm or on week-ends. °4. You should follow a light diet the first few days after arrival home, such as soup and crackers, etc.  Be sure to include lots of fluids daily. °5. Most patients will experience some swelling and bruising in the area of the incisions.  Ice packs will help.  Swelling and bruising can take several days to resolve.  °6. It is common to experience some constipation if taking pain medication after surgery.  Increasing fluid intake and taking a stool softener (such as Colace) will usually help or prevent this problem from occurring.  A mild laxative (Milk of Magnesia or Miralax) should be taken according to package instructions if there are no bowel movements after 48 hours. °7. Unless discharge instructions indicate otherwise, you may remove your bandages 48 hours after surgery, and you may shower at that time.  You will have steri-strips (small skin tapes) in place directly over the incision.  These strips should be left on the skin for 7-10 days.  If your surgeon used skin glue on the incision, you may shower in 24 hours.  The glue will flake off over the next 2-3 weeks.  Any sutures or staples  will be removed at the office during your follow-up visit. °8. ACTIVITIES:  You may resume regular (light) daily activities beginning the next day--such as daily self-care, walking, climbing stairs--gradually increasing activities as tolerated.  You may have sexual intercourse when it is comfortable.  Refrain from any heavy lifting or straining until approved by your doctor. °a. You may drive when you are no longer taking prescription pain medication, you can comfortably wear a seatbelt, and you can safely maneuver your car and apply brakes. °b. RETURN TO WORK:   2-3 weeks °9. You should see your doctor in the office for a follow-up appointment approximately 2-3 weeks after your surgery.  Make sure that you call for this appointment within a day or two after you arrive home to insure a convenient appointment time. °10. OTHER INSTRUCTIONS: ________________________________________________________________________ °WHEN TO CALL YOUR DOCTOR: °1. Fever over 101.0 °2. Inability to urinate °3. Continued bleeding from incision. °4. Increased pain, redness, or drainage from the incision. °5. Increasing abdominal pain ° °The clinic staff is available to answer your questions during regular business hours.  Please don’t hesitate to call and ask to speak to one of the nurses for clinical concerns.  If you have a medical emergency, go to the nearest emergency room or call 911.  A surgeon from [facility] is always on call at the hospital. °[address], [address], [place], [place]  [address] ? [address] [address], [place], [place]   [address] °[phone] ? [phone] ? FAX [phone] °Web site:   [email or web address] ° °

## 2013-10-13 NOTE — Progress Notes (Signed)
Patient was discharged home by MD order; discharged instructions  review and give to patient with care notes and prescriptions; IV DIC; skin intact; patient will be escorted to the discharge lounge via wheelchair.

## 2013-10-16 ENCOUNTER — Encounter (HOSPITAL_COMMUNITY): Payer: Self-pay | Admitting: Surgery

## 2013-10-26 ENCOUNTER — Encounter (INDEPENDENT_AMBULATORY_CARE_PROVIDER_SITE_OTHER): Payer: Medicare Other | Admitting: Surgery

## 2013-11-03 ENCOUNTER — Ambulatory Visit (INDEPENDENT_AMBULATORY_CARE_PROVIDER_SITE_OTHER): Payer: Medicare Other | Admitting: Surgery

## 2013-11-03 ENCOUNTER — Encounter (INDEPENDENT_AMBULATORY_CARE_PROVIDER_SITE_OTHER): Payer: Self-pay | Admitting: Surgery

## 2013-11-03 VITALS — BP 130/72 | HR 77 | Temp 97.6°F | Ht 62.0 in | Wt 219.0 lb

## 2013-11-03 DIAGNOSIS — K8010 Calculus of gallbladder with chronic cholecystitis without obstruction: Secondary | ICD-10-CM

## 2013-11-03 NOTE — Progress Notes (Signed)
Status post laparoscopic cholecystectomy with cholangiogram on 10/12/13 for chronic calculus cholecystitis. The patient denies any diarrhea. She has chronic constipation. Her incisions are well-healed with no sign of infection. Minimal soreness around her incisions.  She may resume full activity and regular diet. Followup when necessary.  [name]. Georgette Dover, MD, [facility] Surgery  General/ Trauma Surgery  11/03/2013 11:24 AM

## 2014-04-26 ENCOUNTER — Encounter (HOSPITAL_COMMUNITY): Payer: Self-pay | Admitting: Cardiology

## 2014-08-31 DIAGNOSIS — E1169 Type 2 diabetes mellitus with other specified complication: Secondary | ICD-10-CM | POA: Insufficient documentation

## 2014-08-31 DIAGNOSIS — E785 Hyperlipidemia, unspecified: Secondary | ICD-10-CM

## 2014-10-27 ENCOUNTER — Emergency Department (HOSPITAL_COMMUNITY): Payer: PRIVATE HEALTH INSURANCE

## 2014-10-27 ENCOUNTER — Encounter (HOSPITAL_COMMUNITY): Payer: Self-pay | Admitting: Emergency Medicine

## 2014-10-27 ENCOUNTER — Emergency Department (HOSPITAL_COMMUNITY)
Admission: EM | Admit: 2014-10-27 | Discharge: 2014-10-27 | Disposition: A | Discharge status: Home / Self Care | Payer: PRIVATE HEALTH INSURANCE | Attending: Emergency Medicine | Admitting: Emergency Medicine

## 2014-10-27 DIAGNOSIS — I509 Heart failure, unspecified: Secondary | ICD-10-CM | POA: Insufficient documentation

## 2014-10-27 DIAGNOSIS — Z7982 Long term (current) use of aspirin: Secondary | ICD-10-CM | POA: Diagnosis not present

## 2014-10-27 DIAGNOSIS — G43909 Migraine, unspecified, not intractable, without status migrainosus: Secondary | ICD-10-CM | POA: Insufficient documentation

## 2014-10-27 DIAGNOSIS — Z8701 Personal history of pneumonia (recurrent): Secondary | ICD-10-CM | POA: Insufficient documentation

## 2014-10-27 DIAGNOSIS — Z791 Long term (current) use of non-steroidal anti-inflammatories (NSAID): Secondary | ICD-10-CM | POA: Insufficient documentation

## 2014-10-27 DIAGNOSIS — E119 Type 2 diabetes mellitus without complications: Secondary | ICD-10-CM | POA: Insufficient documentation

## 2014-10-27 DIAGNOSIS — Z8719 Personal history of other diseases of the digestive system: Secondary | ICD-10-CM | POA: Insufficient documentation

## 2014-10-27 DIAGNOSIS — Z720 Tobacco use: Secondary | ICD-10-CM | POA: Diagnosis not present

## 2014-10-27 DIAGNOSIS — Z794 Long term (current) use of insulin: Secondary | ICD-10-CM | POA: Diagnosis not present

## 2014-10-27 DIAGNOSIS — M542 Cervicalgia: Secondary | ICD-10-CM | POA: Diagnosis not present

## 2014-10-27 DIAGNOSIS — M797 Fibromyalgia: Secondary | ICD-10-CM | POA: Insufficient documentation

## 2014-10-27 DIAGNOSIS — J449 Chronic obstructive pulmonary disease, unspecified: Secondary | ICD-10-CM | POA: Insufficient documentation

## 2014-10-27 DIAGNOSIS — Z79899 Other long term (current) drug therapy: Secondary | ICD-10-CM | POA: Diagnosis not present

## 2014-10-27 DIAGNOSIS — Z862 Personal history of diseases of the blood and blood-forming organs and certain disorders involving the immune mechanism: Secondary | ICD-10-CM | POA: Diagnosis not present

## 2014-10-27 DIAGNOSIS — G8929 Other chronic pain: Secondary | ICD-10-CM | POA: Insufficient documentation

## 2014-10-27 DIAGNOSIS — M1990 Unspecified osteoarthritis, unspecified site: Secondary | ICD-10-CM | POA: Diagnosis not present

## 2014-10-27 DIAGNOSIS — E785 Hyperlipidemia, unspecified: Secondary | ICD-10-CM | POA: Diagnosis not present

## 2014-10-27 DIAGNOSIS — Z86018 Personal history of other benign neoplasm: Secondary | ICD-10-CM | POA: Diagnosis not present

## 2014-10-27 DIAGNOSIS — I10 Essential (primary) hypertension: Secondary | ICD-10-CM | POA: Insufficient documentation

## 2014-10-27 MED ORDER — DIAZEPAM 5 MG PO TABS
5.0000 mg | ORAL_TABLET | Freq: Once | ORAL | Status: AC
  Administered 2014-10-27: 5 mg via ORAL
  Filled 2014-10-27: qty 1

## 2014-10-27 MED ORDER — DIAZEPAM 5 MG PO TABS: 5.0000 mg | ORAL_TABLET | Freq: Two times a day (BID) | ORAL | Status: DC

## 2014-10-27 MED ORDER — OXYCODONE-ACETAMINOPHEN 5-325 MG PO TABS
1.0000 | ORAL_TABLET | Freq: Once | ORAL | Status: AC
  Administered 2014-10-27: 1 via ORAL
  Filled 2014-10-27: qty 1

## 2014-10-27 MED ORDER — OXYCODONE-ACETAMINOPHEN 5-325 MG PO TABS: 1.0000 | ORAL_TABLET | ORAL | Status: DC | PRN

## 2014-10-27 NOTE — ED Provider Notes (Signed)
CSN: [id number]     Arrival date & time 10/27/14  1456 History   First MD Initiated Contact with Patient 10/27/14 1558     Chief Complaint  Patient presents with  . Neck Pain    The patient said her neck started hurting her wednesday and it has progressed down to her lower back, and her right side.  She denies any other symptoms.     (Consider location/radiation/quality/duration/timing/severity/associated sxs/prior Treatment) Patient is a 64 y.o. female presenting with neck pain. The history is provided by the patient and medical records.  Neck Pain   This is a 64 y.o. F with PMH significant for HTN, COPD, anxiety, CHF, hyperlipidemia, DM, chronic back pain, presenting to the ED for neck pain.  Patient states began upon waking on Wednesday and has been progressively worsening since this time.  She states pain begins in the back of her neck and is now starting to spread down her back.  States reports some intermittent paresthesias of her left arm as well.  She states pain has since started to "shoot down" her back and is making it hard for her to move around.  Patient is left hand dominant.  She states she does have hx of back/neck issues-- 2 prior surgeries.  No new fever, chills, sweats.  No hx of IVDU or cancer.  Denies focal weakness, confusion, headache, dizziness, ataxia, slurred speech, or visual disturbance.  Patient has been taking tramadol and aleve at home without relief of pain.  She has also tried hot/cold compresses.  VSS.  Past Medical History  Diagnosis Date  . Hypertension   . COPD (chronic obstructive pulmonary disease)   . Pituitary tumor     followed at [facility]; on cabergoline 09/2013  . Irregular heart beat   . Fibromyalgia   . Anxiety   . CHF (congestive heart failure)   . Hyperlipidemia   . Complication of anesthesia     hard to put under  . Dysrhythmia   . GERD (gastroesophageal reflux disease)   . Family history of anesthesia complication      sister hard to wake up  . Family history of anesthesia complication     niece have itching  . Chronic bronchitis     "got it q yr when I lived in [place]"  . Pneumonia     "twice"  . Type II diabetes mellitus   . Anemia     sickle cell trait  . Migraine     "used to get them alot; not regular anymore" (10/12/2013)  . Arthritis     "knees" (10/12/2013)  . Chronic back pain    Past Surgical History  Procedure Laterality Date  . Back surgery    . Cesarean section  1974; 1976; 1978  . Tonsillectomy    . Appendectomy    . Colonoscopy    . Laparoscopic cholecystectomy  10/12/2013  . Tubal ligation  1978  . Femoral artery stent Left 06/28/2012    SFA  . Posterior lumbar fusion    . Cardiac catheterization    . Cholecystectomy N/A 10/12/2013    Procedure: LAPAROSCOPIC CHOLECYSTECTOMY WITH INTRAOPERATIVE CHOLANGIOGRAM;  Surgeon: [name]. Georgette Dover, MD;  Location: [facility];  Service: General;  Laterality: N/A;  . Left heart catheterization with coronary angiogram N/A 06/28/2012    Procedure: LEFT HEART CATHETERIZATION WITH CORONARY ANGIOGRAM;  Surgeon: Laverda Page, MD;  Location: [facility] CATH LAB;  Service: Cardiovascular;  Laterality: N/A;  . Lower extremity angiogram N/A  06/28/2012    Procedure: LOWER EXTREMITY ANGIOGRAM;  Surgeon: Laverda Page, MD;  Location: [facility] CATH LAB;  Service: Cardiovascular;  Laterality: N/A;  . Abdominal aortagram N/A 06/28/2012    Procedure: ABDOMINAL Maxcine Ham;  Surgeon: Laverda Page, MD;  Location: [facility] CATH LAB;  Service: Cardiovascular;  Laterality: N/A;   Family History  Problem Relation Age of Onset  . Diabetes Mother   . Cancer Father   . Kidney disease Brother    History  Substance Use Topics  . 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 Smokeless tobacco: Never Used  . 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  All other systems reviewed and are negative.     Allergies  Coconut oil  Home Medications   Prior to Admission medications   Medication Sig Start Date End Date Taking? Authorizing Provider  albuterol (PROAIR HFA) 108 (90 BASE) MCG/ACT inhaler Inhale 2 puffs into the lungs every 6 (six) hours as needed for wheezing or shortness of breath. ProAir   Yes Historical Provider, MD  aspirin EC 81 MG tablet Take 81 mg by mouth daily.   Yes Historical Provider, MD  atorvastatin (LIPITOR) 80 MG tablet Take 80 mg by mouth at bedtime.  09/14/13  Yes Historical Provider, MD  budesonide-formoterol (SYMBICORT) 160-4.5 MCG/ACT inhaler Inhale 2 puffs into the lungs 2 (two) times daily.   Yes Historical Provider, MD  clonazePAM (KLONOPIN) 0.5 MG tablet Take 0.5 mg by mouth daily as needed for anxiety.  08/16/13  Yes Historical Provider, MD  dicyclomine (BENTYL) 20 MG tablet Take 20 mg by mouth 4 (four) times daily as needed for spasms.  09/06/13  Yes Historical Provider, MD  ezetimibe (ZETIA) 10 MG tablet Take 10 mg by mouth daily with supper.   Yes Historical Provider, MD  famotidine (PEPCID) 40 MG tablet Take 40 mg by mouth 2 (two) times daily.   Yes Historical Provider, MD  gabapentin (NEURONTIN) 300 MG capsule Take 1 capsule (300 mg total) by mouth 3 (three) times daily. Patient taking differently: Take 300 mg by mouth 4 (four) times daily.  07/04/12  Yes Charlett Blake, MD  ibuprofen (ADVIL,MOTRIN) 200 MG tablet Take 200 mg by mouth 2 (two) times daily as needed (pain).   Yes Historical Provider, MD  insulin lispro protamine-lispro (HUMALOG 75/25 MIX) (75-25) 100 UNIT/ML SUSP injection Inject 22 Units into the skin 2 (two) times daily with a meal.   Yes Historical Provider, MD  lisinopril-hydrochlorothiazide (PRINZIDE,ZESTORETIC) 20-12.5 MG per tablet Take 1 tablet by mouth daily.    Yes Historical Provider, MD  meloxicam (MOBIC) 15 MG  tablet TAKE 1 TABLET BY MOUTH DAILY 09/26/14  Yes Historical Provider, MD  pregabalin (LYRICA) 150 MG capsule Take 150 mg by mouth 3 (three) times daily.    Yes Historical Provider, MD  promethazine (PHENERGAN) 25 MG tablet Take 25 mg by mouth every 4 (four) hours as needed for nausea or vomiting.  09/24/14  Yes Historical Provider, MD  tiZANidine (ZANAFLEX) 4 MG tablet TAKE 1/2 TABLET BY MOUTH THREE TIMES DAILY 10/03/14  Yes Historical Provider, MD  traMADol (ULTRAM) 50 MG tablet Take 50 mg by mouth 4 (four) times daily as needed (pain).  08/16/13  Yes Historical Provider, MD  zolpidem (AMBIEN) 5 MG tablet Take 5 mg by mouth at bedtime as needed for sleep.    Yes Historical Provider, MD  Lancets Glory Rosebush ULTRASOFT) lancets  07/06/13   Historical Provider, MD  [facility] VERIO test strip  08/20/13   Historical Provider, MD   BP 129/96 mmHg  Pulse 85  Temp(Src) 98.3 F (36.8 C) (Oral)  Resp 18  Ht 5\' 2"  (1.575 m)  Wt 210 lb (95.255 kg)  BMI 38.40 kg/m2  SpO2 99%   Physical Exam  Constitutional: She is oriented to person, place, and time. She appears well-developed and well-nourished.  HENT:  Head: Normocephalic and atraumatic.  Mouth/Throat: Oropharynx is clear and moist.  Eyes: Conjunctivae and EOM are normal. Pupils are equal, round, and reactive to light.  Neck: Normal range of motion and full passive range of motion without pain. Neck supple. No rigidity.  No rigidity, no meningismus  Cardiovascular: Normal rate, regular rhythm and normal heart sounds.   Pulmonary/Chest: Effort normal and breath sounds normal.  Abdominal: Soft. Bowel sounds are normal.  Musculoskeletal: Normal range of motion.       Thoracic back: Normal.       Lumbar back: Normal.  Diffuse tenderness of cervical spine, worse of left paraspinal region; full ROM maintained although appears painful; normal grip strength bilaterally; normal sensation throughout both arms  Neurological: She is alert and oriented to person,  place, and time.  AAOx3, answering questions appropriately; equal strength UE and LE bilaterally; CN grossly intact; moves all extremities appropriately without ataxia; no focal neuro deficits or facial asymmetry appreciated  Skin: Skin is warm and dry.  Psychiatric: She has a normal mood and affect.  Nursing note and vitals reviewed.   ED Course  Procedures (including critical care time) Labs Review Labs Reviewed - No data to display  Imaging Review Ct Cervical Spine Wo Contrast  10/27/2014   CLINICAL DATA:  64 year old female with posterior cervical spine pain radiating down right side for 3-4 days. No known injury.  EXAM: CT CERVICAL SPINE WITHOUT CONTRAST  TECHNIQUE: Multidetector CT imaging of the cervical spine was performed without intravenous contrast. Multiplanar CT image reconstructions were also generated.  COMPARISON:  None.  FINDINGS: Straightening of the normal cervical lordosis identified.  There is no evidence of fracture, subluxation or prevertebral soft tissue swelling.  No focal bony lesions are identified.  Minimal degenerative disc disease/disc space narrowing at C5-6 identified.  There is no evidence of bony foraminal narrowing.  There may be mild disc bulges at C3-4, C4-5 and C5-6.  Soft tissue structures are unremarkable.  IMPRESSION: No evidence of acute abnormality.  Minimal degenerative changes at C5-6. Possible mild disc bulges from C3-C6. MRI may be helpful for further evaluation as clinically indicated.   Electronically Signed   By: Margarette Canada M.D.   On: 10/27/2014 17:15     EKG Interpretation None      MDM   Final diagnoses:  Neck pain   64 year old female here with several days of neck pain. Appears worse on her left side. She denies any chest pain or shortness of breath. Patient does have history of neck and back problems in the past. On exam, her cervical spine is diffusely tender but there are no noted deformities or step-off. She has normal strength and  sensation of her bilateral upper extremities without findings to suggest central cord syndrome.  Neuro exam is non-focal, doubt TIA, stoke, ICH, SAH, or meningitis.  CT  of cervical spine was obtained-- degenerative changes noted, possible disc herniations.  May need MRI in the future for further delineation, however do not feel this needs to be done emergently and could be arranged by her PCP.  She may ultimately need referral to neurosurgeon as well.  Patient treated with percocet and valium here in the ED with improvement of her pain.  Neuro exam remains non-focal, patient is stable for discharge.  She has FU with her PCP next week.  Will d/c home with percocet and valium.  Discussed plan with patient, he/she acknowledged understanding and agreed with plan of care.  Return precautions given for new or worsening symptoms.  Larene Pickett, PA-C 10/27/14 [place], MD 10/27/14 [phone]

## 2014-10-27 NOTE — ED Notes (Signed)
PA Sanders at bedside  

## 2014-10-27 NOTE — ED Notes (Signed)
Patient transported to CT 

## 2014-10-27 NOTE — ED Notes (Addendum)
The patient said her neck started hurting her wednesday and it has progressed down to her lower back, and her left side.  She denies any other symptoms.  She rates her pain 10/10.

## 2014-10-27 NOTE — Discharge Instructions (Signed)
Take the prescribed medication as directed.  Use caution if driving, can make you drowsy. Follow-up with your primary care physician.  You may need referral to neurosurgeon and/or MRI.  Copies of CT scan attached on back. Return to the ED for new or worsening symptoms.

## 2014-10-27 NOTE — ED Notes (Signed)
Pt states she has throbbing in the back of her head that goes to her neck, down her back and is making her left leg feel weak. Pt denies injury but does report hx of neck problems. Pt alert, oriented x 4.

## 2014-11-29 DIAGNOSIS — E559 Vitamin D deficiency, unspecified: Secondary | ICD-10-CM | POA: Insufficient documentation

## 2014-12-18 DIAGNOSIS — M25562 Pain in left knee: Secondary | ICD-10-CM | POA: Insufficient documentation

## 2014-12-18 DIAGNOSIS — G5602 Carpal tunnel syndrome, left upper limb: Secondary | ICD-10-CM | POA: Insufficient documentation

## 2015-04-05 ENCOUNTER — Non-Acute Institutional Stay (SKILLED_NURSING_FACILITY): Payer: Medicare Other | Admitting: Internal Medicine

## 2015-04-05 ENCOUNTER — Encounter: Payer: Self-pay | Admitting: Internal Medicine

## 2015-04-05 DIAGNOSIS — E1142 Type 2 diabetes mellitus with diabetic polyneuropathy: Secondary | ICD-10-CM | POA: Diagnosis not present

## 2015-04-05 DIAGNOSIS — M1711 Unilateral primary osteoarthritis, right knee: Secondary | ICD-10-CM | POA: Diagnosis not present

## 2015-04-05 DIAGNOSIS — M79605 Pain in left leg: Secondary | ICD-10-CM | POA: Diagnosis not present

## 2015-04-05 DIAGNOSIS — M7989 Other specified soft tissue disorders: Secondary | ICD-10-CM | POA: Insufficient documentation

## 2015-04-05 DIAGNOSIS — E1151 Type 2 diabetes mellitus with diabetic peripheral angiopathy without gangrene: Secondary | ICD-10-CM

## 2015-04-05 DIAGNOSIS — E785 Hyperlipidemia, unspecified: Secondary | ICD-10-CM | POA: Diagnosis not present

## 2015-04-05 DIAGNOSIS — E1140 Type 2 diabetes mellitus with diabetic neuropathy, unspecified: Secondary | ICD-10-CM | POA: Insufficient documentation

## 2015-04-05 DIAGNOSIS — I10 Essential (primary) hypertension: Secondary | ICD-10-CM | POA: Diagnosis not present

## 2015-04-05 DIAGNOSIS — M79662 Pain in left lower leg: Secondary | ICD-10-CM | POA: Insufficient documentation

## 2015-04-05 NOTE — Assessment & Plan Note (Signed)
SNF - more than expected for post surgery; LLE U/S had ben ordered

## 2015-04-05 NOTE — Progress Notes (Signed)
MRN: [id number] Name: Christina Solis  Sex: female Age: 64 y.o. DOB: 08/29/1950  [place] #: [facility] Facility/Room:509 Level Of Care: SNF Provider: Inocencio Homes D Emergency Contacts: Extended Emergency Contact Information Primary Emergency Contact: Elzie Rings, [place] Phone: [phone] Relation: Brother Secondary Emergency Contact: [place] Mobile Phone: [phone] Relation: Daughter  Code Status:   Allergies: Coconut oil  Chief Complaint  Patient presents with  . New Admit To SNF    HPI: Patient is 64 y.o. female with HTN,CHF,fibromyalgia HLD, COPD,and anxiety who was admitted to [facility] from 11/12-17 for a L knee replacement. Pt is admitted to SNF for OT/PT. While at SNF pt will be followed for HTN, tx with zestoretic, HLD, tx with zetia and crestor and diabetic polyneuropathy tx with lyrica. Pt admits since SNF admission that her L knee and leg is hurting more, not less in past day and admits hurting in calf and thigh. This will need to be evaluated for possibility of DVT.  Past Medical History  Diagnosis Date  . Hypertension   . COPD (chronic obstructive pulmonary disease) ([place])   . Pituitary tumor [facility])     followed at [facility]; on cabergoline 09/2013  . Irregular heart beat   . Fibromyalgia   . Anxiety   . CHF (congestive heart failure) ([place])   . Hyperlipidemia   . Complication of anesthesia     hard to put under  . Dysrhythmia   . GERD (gastroesophageal reflux disease)   . Family history of anesthesia complication     sister hard to wake up  . Family history of anesthesia complication     niece have itching  . Chronic bronchitis ([place])     "got it q yr when I lived in [place]"  . Pneumonia     "twice"  . Type II diabetes mellitus ([place])   . Anemia     sickle cell trait  . Migraine     "used to get them alot; not regular anymore" (10/12/2013)  . Arthritis     "knees"  (10/12/2013)  . Chronic back pain     Past Surgical History  Procedure Laterality Date  . Back surgery    . Cesarean section  1974; 1976; 1978  . Tonsillectomy    . Appendectomy    . Colonoscopy    . Laparoscopic cholecystectomy  10/12/2013  . Tubal ligation  1978  . Femoral artery stent Left 06/28/2012    SFA  . Posterior lumbar fusion    . Cardiac catheterization    . Cholecystectomy N/A 10/12/2013    Procedure: LAPAROSCOPIC CHOLECYSTECTOMY WITH INTRAOPERATIVE CHOLANGIOGRAM;  Surgeon: [name]. Georgette Dover, MD;  Location: [facility];  Service: General;  Laterality: N/A;  . Left heart catheterization with coronary angiogram N/A 06/28/2012    Procedure: LEFT HEART CATHETERIZATION WITH CORONARY ANGIOGRAM;  Surgeon: Laverda Page, MD;  Location: [facility] CATH LAB;  Service: Cardiovascular;  Laterality: N/A;  . Lower extremity angiogram N/A 06/28/2012    Procedure: LOWER EXTREMITY ANGIOGRAM;  Surgeon: Laverda Page, MD;  Location: [facility] CATH LAB;  Service: Cardiovascular;  Laterality: N/A;  . Abdominal aortagram N/A 06/28/2012    Procedure: ABDOMINAL Maxcine Ham;  Surgeon: Laverda Page, MD;  Location: [facility] CATH LAB;  Service: Cardiovascular;  Laterality: N/A;      Medication List       This list is accurate as of: 04/05/15  9:56 PM.  Always use your most recent med list.               budesonide-formoterol 160-4.5 MCG/ACT inhaler  Commonly known as:  SYMBICORT  Inhale 2 puffs into the lungs 2 (two) times daily.     diazepam 5 MG tablet  Commonly known as:  VALIUM  Take 5 mg by mouth every 12 (twelve) hours as needed for anxiety.     dicyclomine 20 MG tablet  Commonly known as:  BENTYL  Take 20 mg by mouth 4 (four) times daily as needed for spasms.     ezetimibe 10 MG tablet  Commonly known as:  ZETIA  Take 10 mg by mouth daily with supper.     famotidine 40 MG tablet  Commonly known as:  PEPCID  Take 40 mg by mouth 2 (two) times daily.     HYDROcodone-acetaminophen 5-325 MG  tablet  Commonly known as:  NORCO/VICODIN  Take 1 tablet by mouth every 6 (six) hours as needed for moderate pain.     insulin lispro protamine-lispro (75-25) 100 UNIT/ML Susp injection  Commonly known as:  HUMALOG 75/25 MIX  Inject 22 Units into the skin 2 (two) times daily with a meal.     lisinopril-hydrochlorothiazide 20-12.5 MG tablet  Commonly known as:  PRINZIDE,ZESTORETIC  Take 1 tablet by mouth daily.     meloxicam 15 MG tablet  Commonly known as:  MOBIC  TAKE 1 TABLET BY MOUTH DAILY     onetouch ultrasoft lancets     ONETOUCH VERIO test strip  Generic drug:  glucose blood     oxyCODONE-acetaminophen 5-325 MG tablet  Commonly known as:  PERCOCET/ROXICET  Take 1 tablet by mouth every 4 (four) hours as needed.     pregabalin 200 MG capsule  Commonly known as:  LYRICA  Take 200 mg by mouth 3 (three) times daily.     PROAIR HFA 108 (90 BASE) MCG/ACT inhaler  Generic drug:  albuterol  Inhale 2 puffs into the lungs every 6 (six) hours as needed for wheezing or shortness of breath. ProAir     tiZANidine 4 MG tablet  Commonly known as:  ZANAFLEX  TAKE 1/2 TABLET BY MOUTH THREE TIMES DAILY     traMADol 50 MG tablet  Commonly known as:  ULTRAM  Take 100 mg by mouth every 6 (six) hours as needed (pain).     Vitamin D (Ergocalciferol) 50000 UNITS Caps capsule  Commonly known as:  DRISDOL  Take 50,000 Units by mouth every 7 (seven) days.        Meds ordered this encounter  Medications  . diazepam (VALIUM) 5 MG tablet    Sig: Take 5 mg by mouth every 12 (twelve) hours as needed for anxiety.  . Vitamin D, Ergocalciferol, (DRISDOL) 50000 UNITS CAPS capsule    Sig: Take 50,000 Units by mouth every 7 (seven) days.  Marland Kitchen HYDROcodone-acetaminophen (NORCO/VICODIN) 5-325 MG tablet    Sig: Take 1 tablet by mouth every 6 (six) hours as needed for moderate pain.  . pregabalin (LYRICA) 200 MG capsule    Sig: Take 200 mg by mouth 3 (three) times daily.     There is no  immunization history on file for this patient.  Social History  Substance Use Topics  . 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 Smokeless tobacco: Never Used  . Alcohol Use:  No     Comment: "I've been sober since 110"    Family history is + CA, DM2   Review of Systems  DATA OBTAINED: from patient GENERAL:  no fevers, fatigue, appetite changes SKIN: No itching, rash or wounds EYES: No eye pain, redness, discharge EARS: No earache, tinnitus, change in hearing NOSE: No congestion, drainage or bleeding  MOUTH/THROAT: No mouth or tooth pain, No sore throat RESPIRATORY: No cough, wheezing, SOB CARDIAC: No chest pain, palpitations, lL ower extremity edema  GI: No abdominal pain, No N/V/D or constipation, No heartburn or reflux  GU: No dysuria, frequency or urgency, or incontinence  MUSCULOSKELETAL: admits pain in L leg worse NEUROLOGIC: No headache, dizziness or focal weakness PSYCHIATRIC: No c/o anxiety or sadness   Filed Vitals:   04/05/15 1238  BP: 103/62  Pulse: 88  Temp: 97.1 F (36.2 C)  Resp: 18    SpO2 Readings from Last 1 Encounters:  10/27/14 100%        Physical Exam  GENERAL APPEARANCE: Alert, conversant,  No acute distress.  SKIN: No diaphoresis rash HEAD: Normocephalic, atraumatic  EYES: Conjunctiva/lids clear. Pupils round, reactive. EOMs intact.  EARS: External exam WNL, canals clear. Hearing grossly normal.  NOSE: No deformity or discharge.  MOUTH/THROAT: Lips w/o lesions  RESPIRATORY: Breathing is even, unlabored. Lung sounds are clear   CARDIOVASCULAR: Heart RRR no murmurs, rubs or gallops. LLE with  peripheral edema and heat greater than expected for post surgery;no palpable cord but some c/o TTP post calf and distal thigh  GASTROINTESTINAL: Abdomen is soft, non-tender, not distended w/ normal bowel sounds. GENITOURINARY: Bladder non tender, not distended   MUSCULOSKELETAL: No abnormal joints or musculature NEUROLOGIC:  Cranial nerves 2-12 grossly intact. Moves all extremities  PSYCHIATRIC: Mood and affect appropriate to situation, no behavioral issues  Patient Active Problem List   Diagnosis Date Noted  . Osteoarthritis of right knee 04/05/2015  . HTN (hypertension) 04/05/2015  . Neuropathy in diabetes ([place]) 04/05/2015  . Hyperlipidemia 04/05/2015  . Pain and swelling of left lower leg 04/05/2015  . Chronic cholecystitis with calculus 09/15/2013  . Diabetes mellitus type 2 with peripheral artery disease ([place]) 06/28/2012  . Claudication in peripheral vascular disease ([place]) 06/28/2012  . Fibromyalgia 06/20/2012  . Shoulder pain 06/20/2012  . Lumbar postlaminectomy syndrome 06/20/2012    CBC    Component Value Date/Time   WBC 12.0* 10/12/2013 2212   RBC 3.80* 10/12/2013 2212   HGB 11.2* 10/12/2013 2212   HCT 35.1* 10/12/2013 2212   PLT 338 10/12/2013 2212   MCV 92.4 10/12/2013 2212   LYMPHSABS 4.3* 09/03/2013 2311   MONOABS 0.6 09/03/2013 2311   EOSABS 0.2 09/03/2013 2311   BASOSABS 0.1 09/03/2013 2311    CMP     Component Value Date/Time   NA 143 10/04/2013 0937   K 4.1 10/04/2013 0937   CL 105 10/04/2013 0937   CO2 25 10/04/2013 0937   GLUCOSE 133* 10/04/2013 0937   BUN 21 10/04/2013 0937   CREATININE 0.92 10/12/2013 2212   CALCIUM 10.1 10/04/2013 0937   PROT 7.8 09/03/2013 2311   ALBUMIN 3.3* 09/03/2013 2311   AST 11 09/03/2013 2311   ALT 7 09/03/2013 2311   ALKPHOS 64 09/03/2013 2311   BILITOT 0.2* 09/03/2013 2311   GFRNONAA 66* 10/12/2013 2212   GFRAA 76* 10/12/2013 2212    Lab Results  Component Value Date   HGBA1C 6.9* 10/12/2013     Ct Cervical Spine Wo Contrast  10/27/2014  CLINICAL DATA:  64 year old female with posterior cervical spine pain radiating down right side for 3-4 days. No known injury. EXAM: CT CERVICAL SPINE WITHOUT CONTRAST TECHNIQUE: Multidetector CT imaging of the cervical spine was  performed without intravenous contrast. Multiplanar CT image reconstructions were also generated. COMPARISON:  None. FINDINGS: Straightening of the normal cervical lordosis identified. There is no evidence of fracture, subluxation or prevertebral soft tissue swelling. No focal bony lesions are identified. Minimal degenerative disc disease/disc space narrowing at C5-6 identified. There is no evidence of bony foraminal narrowing. There may be mild disc bulges at C3-4, C4-5 and C5-6. Soft tissue structures are unremarkable. IMPRESSION: No evidence of acute abnormality. Minimal degenerative changes at C5-6. Possible mild disc bulges from C3-C6. MRI may be helpful for further evaluation as clinically indicated. Electronically Signed   By: Margarette Canada M.D.   On: 10/27/2014 17:15    Not all labs, radiology exams or other studies done during hospitalization come through on my EPIC note; however they are reviewed by me.    Assessment and Plan  Osteoarthritis of right knee S/p knee replacement 11/14; an dadmitted to SNF for OT/PT; no ASA on d/c med list, notes state pt to be on ASA at SNF for prophylaxis;will start ASA 325 mg daily  HTN (hypertension) SNF - chronic and controlled on zestoretic 20/12.5 daily; plan - cont   Diabetes mellitus type 2 with peripheral artery disease SNF - will cont home med of 22u BID of 70/30; pt on ACE and statin  Neuropathy in diabetes ([place]) SNF - chronic and stable; cont lyrica 200 mg TID  Hyperlipidemia SNF - Not stated as uncontrolled; cont zetia and crestor daily  Pain and swelling of left lower leg SNF - more than expected for post surgery; LLE U/S had ben ordered   Time spent 45 min; > 50% of time with patient was spent reviewing records, labs, tests and studies, counseling and developing plan of care   Hennie Duos, MD

## 2015-04-05 NOTE — Assessment & Plan Note (Signed)
SNF - will cont home med of 22u BID of 70/30; pt on ACE and statin

## 2015-04-05 NOTE — Assessment & Plan Note (Signed)
SNF - chronic and stable; cont lyrica 200 mg TID

## 2015-04-05 NOTE — Assessment & Plan Note (Signed)
SNF - chronic and controlled on zestoretic 20/12.5 daily; plan - cont

## 2015-04-05 NOTE — Assessment & Plan Note (Signed)
S/p knee replacement 11/14; an dadmitted to SNF for OT/PT; no ASA on d/c med list, notes state pt to be on ASA at SNF for prophylaxis;will start ASA 325 mg daily

## 2015-04-05 NOTE — Assessment & Plan Note (Addendum)
SNF - Not stated as uncontrolled; cont zetia and crestor daily

## 2015-04-08 ENCOUNTER — Non-Acute Institutional Stay (SKILLED_NURSING_FACILITY): Payer: Medicare Other | Admitting: Internal Medicine

## 2015-04-08 DIAGNOSIS — M179 Osteoarthritis of knee, unspecified: Secondary | ICD-10-CM | POA: Diagnosis not present

## 2015-04-08 DIAGNOSIS — K649 Unspecified hemorrhoids: Secondary | ICD-10-CM

## 2015-04-08 DIAGNOSIS — M1711 Unilateral primary osteoarthritis, right knee: Secondary | ICD-10-CM

## 2015-04-08 DIAGNOSIS — D649 Anemia, unspecified: Secondary | ICD-10-CM | POA: Diagnosis not present

## 2015-04-08 NOTE — Progress Notes (Signed)
Patient ID: [name], female   DOB: [birth year], 64 y.o.   MRN: [id number] MRN: [id number] Name: Christina Solis  Sex: female Age: 64 y.o. DOB: Feb 04, 1951  [place] #: [facility] Facility/Room:509 Level Of Care: SNF Provider: Wille Celeste Emergency Contacts: Extended Emergency Contact Information Primary Emergency Contact: Christina Solis, Christina Solis of [place] Phone: [phone] Relation: Brother Secondary Emergency Contact: Christina Solis [place] of [place] Mobile Phone: [phone] Relation: Daughter  Code Status:   Allergies: Coconut oil  Chief Complaint  Patient presents with  . Acute Visit   secondary to follow-up mild leukocytosis-follow-up anemia  HPI: Patient is 64 y.o. female with HTN,CHF,fibromyalgia HLD, COPD,and anxiety who was admitted to [facility] from 11/12-17 for a L knee replacement. Pt  admitted to SNF for OT/PT. While at SNF pt  followed for HTN, tx with zestoretic, HLD, tx with zetia and crestor and diabetic polyneuropathy tx with lyrica. Dr. Sheppard Coil did order a venous Doppler of her left leg secondary to concerns about edema this has been negative for DVT.  Routine labs also were ordered which shows a mildly elevated white count of 11.6-I do not see labs for comparison -- appears her white count was 12 back in May.  She is afebrile she does not complain of any fever chills cough or dysuria.  Updated lab also shows a hemoglobin of 8.7-I suspect some of this may be postop blood loss again difficult to find previous labs and post op labs from the hospital clinically she appears to be stable There is been no overt rectal bleeding to my knowledge.  Her vital signs continued to be stable her main complaint today is feeling she has constipation at times and would like agent for this.  .  Past Medical History  Diagnosis Date  . Hypertension   . COPD (chronic obstructive pulmonary disease) ([place])   . 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 Irregular heart beat   . Fibromyalgia   . Anxiety   . CHF (congestive heart failure) ([place])   . Hyperlipidemia   . Complication of anesthesia     hard to put under  . Dysrhythmia   . GERD (gastroesophageal reflux disease)   . Family history of anesthesia complication     sister hard to wake up  . Family history of anesthesia complication     niece have itching  . Chronic bronchitis ([place])     "got it q yr when I lived in [place]"  . Pneumonia     "twice"  . Type II diabetes mellitus ([place])   . Anemia     sickle cell trait  . Migraine     "used to get them alot; not regular anymore" (10/12/2013)  . Arthritis     "knees" (10/12/2013)  . Chronic back pain     Past Surgical History  Procedure Laterality Date  . Back surgery    . Cesarean section  1974; 1976; 1978  . Tonsillectomy    . Appendectomy    . Colonoscopy    . Laparoscopic cholecystectomy  10/12/2013  . Tubal ligation  1978  . Femoral artery stent Left 06/28/2012    SFA  . Posterior lumbar fusion    . Cardiac catheterization    . Cholecystectomy N/A 10/12/2013    Procedure: LAPAROSCOPIC CHOLECYSTECTOMY WITH INTRAOPERATIVE CHOLANGIOGRAM;  Surgeon: [name]. Georgette Dover, MD;  Location: [place];  Service: General;  Laterality: N/A;  . Left heart catheterization with coronary angiogram N/A 06/28/2012    Procedure: LEFT HEART CATHETERIZATION WITH CORONARY ANGIOGRAM;  Surgeon: Laverda Page, MD;  Location: [facility] CATH LAB;  Service: Cardiovascular;  Laterality: N/A;  . Lower extremity angiogram N/A 06/28/2012    Procedure: LOWER EXTREMITY ANGIOGRAM;  Surgeon: Laverda Page, MD;  Location: [facility] CATH LAB;  Service: Cardiovascular;  Laterality: N/A;  . Abdominal aortagram N/A 06/28/2012    Procedure: ABDOMINAL Maxcine Ham;  Surgeon: Laverda Page, MD;  Location: [facility] CATH LAB;  Service: Cardiovascular;  Laterality: N/A;      Medication List       This list is accurate as  of: 04/08/15 11:59 PM.  Always use your most recent med list.               aspirin 325 MG tablet  Take 325 mg by mouth daily.     budesonide-formoterol 160-4.5 MCG/ACT inhaler  Commonly known as:  SYMBICORT  Inhale 2 puffs into the lungs 2 (two) times daily.     diazepam 5 MG tablet  Commonly known as:  VALIUM  Take 5 mg by mouth every 12 (twelve) hours as needed for anxiety.     dicyclomine 20 MG tablet  Commonly known as:  BENTYL  Take 20 mg by mouth 4 (four) times daily as needed for spasms.     ezetimibe 10 MG tablet  Commonly known as:  ZETIA  Take 10 mg by mouth daily with supper.     famotidine 40 MG tablet  Commonly known as:  PEPCID  Take 40 mg by mouth 2 (two) times daily.     HYDROcodone-acetaminophen 5-325 MG tablet  Commonly known as:  NORCO/VICODIN  Take 1 tablet by mouth every 6 (six) hours as needed for moderate pain.     insulin lispro protamine-lispro (75-25) 100 UNIT/ML Susp injection  Commonly known as:  HUMALOG 75/25 MIX  Inject 22 Units into the skin 2 (two) times daily with a meal.     lisinopril-hydrochlorothiazide 20-12.5 MG tablet  Commonly known as:  PRINZIDE,ZESTORETIC  Take 1 tablet by mouth daily.     meloxicam 15 MG tablet  Commonly known as:  MOBIC  TAKE 1 TABLET BY MOUTH DAILY     methocarbamol 500 MG tablet  Commonly known as:  ROBAXIN  Take 1,000 mg by mouth 4 (four) times daily.     onetouch ultrasoft lancets     ONETOUCH VERIO test strip  Generic drug:  glucose blood     oxyCODONE 5 MG immediate release tablet  Commonly known as:  Oxy IR/ROXICODONE  Take 5 mg by mouth 4 (four) times daily -  before meals and at bedtime.     oxyCODONE-acetaminophen 5-325 MG tablet  Commonly known as:  PERCOCET/ROXICET  Take 1 tablet by mouth every 4 (four) hours as needed.     pregabalin 200 MG capsule  Commonly known as:  LYRICA  Take 200 mg by mouth 3 (three) times daily.     PROAIR HFA 108 (90 BASE) MCG/ACT inhaler  Generic  drug:  albuterol  Inhale 2 puffs into the lungs every 6 (six) hours as needed for wheezing or shortness of breath. ProAir     tiZANidine 4 MG tablet  Commonly known as:  ZANAFLEX  TAKE 1/2 TABLET BY MOUTH THREE TIMES DAILY     traMADol 50 MG tablet  Commonly known as:  ULTRAM  Take 100 mg by mouth  every 6 (six) hours as needed (pain).     Vitamin D (Ergocalciferol) 50000 UNITS Caps capsule  Commonly known as:  DRISDOL  Take 50,000 Units by mouth every 7 (seven) days.        Meds ordered this encounter  Medications  . oxyCODONE (OXY IR/ROXICODONE) 5 MG immediate release tablet    Sig: Take 5 mg by mouth 4 (four) times daily -  before meals and at bedtime.  . methocarbamol (ROBAXIN) 500 MG tablet    Sig: Take 1,000 mg by mouth 4 (four) times daily.  Marland Kitchen aspirin 325 MG tablet    Sig: Take 325 mg by mouth daily.     There is no immunization history on file for this patient.  Social History  Substance Use Topics  . 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 Smokeless tobacco: Never Used  . 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"    Family history is + CA, DM2   Review of Systems  DATA OBTAINED: from patient GENERAL:  no fevers, fatigue, appetite changes-still feels a bit weak postop- SKIN: No itching, rash or wounds EYES: No eye pain, redness, discharge EARS: No earache, tinnitus, change in hearing NOSE: No congestion, drainage or bleeding  MOUTH/THROAT: No mouth or tooth pain, No sore throat RESPIRATORY: No cough, wheezing, SOB CARDIAC: No chest pain, palpitations-edema left lower extremity apparently has improved per nursing  GI: No abdominal pain, No N/V/D or constipation, No heartburn or reflux  GU: No dysuria, frequency or urgency, or incontinence  MUSCULOSKELETAL: admits pain in L leg worse NEUROLOGIC: No headache, dizziness or focal weakness PSYCHIATRIC: No c/o  anxiety or sadness   Filed Vitals:   04/08/15 1252  BP: 115/58  Pulse: 70  Temp: 97 F (36.1 C)  Resp: 20    SpO2 Readings from Last 1 Encounters:  10/27/14 100%        Physical Exam  GENERAL APPEARANCE: Alert, conversant,  No acute distress.  SKIN: No diaphoresis rash HEAD: Normocephalic, atraumatic  EYES: Conjunctiva/lids clear. Pupils round, reactive. EOMs intact.  EARS: External exam WNL, canals clear. Hearing grossly normal.  NOSE: No deformity or discharge.  MOUTH/THROAT: Oropharynx is clear mucous membranes moist  RESPIRATORY: Breathing is even, unlabored. Lung sounds are clear   CARDIOVASCULAR: Heart RRR no murmurs, rubs or gallops. LLE  Appears to be improving  GASTROINTESTINAL: Abdomen is soft, non-tender, not distended w/ normal bowel sounds.  MUSCULOSKELETAL: No abnormal joints or musculature--left knee surgical site Staples are in place I do not see signs of infection.  She does use a walker for ambulation NEUROLOGIC:  Cranial nerves 2-12 grossly intact. Moves all extremities  PSYCHIATRIC: Mood and affect appropriate to situation, no behavioral issues  Patient Active Problem List   Diagnosis Date Noted  . Anemia, unspecified 04/08/2015  . Hemorrhoids 04/08/2015  . Osteoarthritis of right knee 04/05/2015  . HTN (hypertension) 04/05/2015  . Neuropathy in diabetes ([place]) 04/05/2015  . Hyperlipidemia 04/05/2015  . Pain and swelling of left lower leg 04/05/2015  . Chronic cholecystitis with calculus 09/15/2013  . Diabetes mellitus type 2 with peripheral artery disease ([place]) 06/28/2012  . Claudication in peripheral vascular disease ([place]) 06/28/2012  . Fibromyalgia 06/20/2012  . Shoulder pain 06/20/2012  . Lumbar postlaminectomy syndrome 06/20/2012     Labs.  04/05/2015.  WBC 11.6 hemoglobin 8.7 platelets 259--MCV is  90.  Sodium 139 potassium 4.2 BUN 21 creatinine 0.9 CBC    Component Value Date/Time   WBC 12.0* 10/12/2013 2212   RBC 3.80*  10/12/2013 2212   HGB 11.2* 10/12/2013 2212   HCT 35.1* 10/12/2013 2212   PLT 338 10/12/2013 2212   MCV 92.4 10/12/2013 2212   LYMPHSABS 4.3* 09/03/2013 2311   MONOABS 0.6 09/03/2013 2311   EOSABS 0.2 09/03/2013 2311   BASOSABS 0.1 09/03/2013 2311    CMP     Component Value Date/Time   NA 143 10/04/2013 0937   K 4.1 10/04/2013 0937   CL 105 10/04/2013 0937   CO2 25 10/04/2013 0937   GLUCOSE 133* 10/04/2013 0937   BUN 21 10/04/2013 0937   CREATININE 0.92 10/12/2013 2212   CALCIUM 10.1 10/04/2013 0937   PROT 7.8 09/03/2013 2311   ALBUMIN 3.3* 09/03/2013 2311   AST 11 09/03/2013 2311   ALT 7 09/03/2013 2311   ALKPHOS 64 09/03/2013 2311   BILITOT 0.2* 09/03/2013 2311   GFRNONAA 66* 10/12/2013 2212   GFRAA 76* 10/12/2013 2212    Lab Results  Component Value Date   HGBA1C 6.9* 10/12/2013     Ct Cervical Spine Wo Contrast  10/27/2014  CLINICAL DATA:  64 year old female with posterior cervical spine pain radiating down right side for 3-4 days. No known injury. EXAM: CT CERVICAL SPINE WITHOUT CONTRAST TECHNIQUE: Multidetector CT imaging of the cervical spine was performed without intravenous contrast. Multiplanar CT image reconstructions were also generated. COMPARISON:  None. FINDINGS: Straightening of the normal cervical lordosis identified. There is no evidence of fracture, subluxation or prevertebral soft tissue swelling. No focal bony lesions are identified. Minimal degenerative disc disease/disc space narrowing at C5-6 identified. There is no evidence of bony foraminal narrowing. There may be mild disc bulges at C3-4, C4-5 and C5-6. Soft tissue structures are unremarkable. IMPRESSION: No evidence of acute abnormality. Minimal degenerative changes at C5-6. Possible mild disc bulges from C3-C6. MRI may be helpful for further evaluation as clinically indicated. Electronically Signed   By: Margarette Canada M.D.   On: 10/27/2014 17:15    Not all labs, radiology exams or other studies done  during hospitalization come through on my EPIC note; however they are reviewed by me.    Assessment and Plan  #1 anemia-suspect there could be an element of iron deficiency here with postop blood loss-this was discussed with Dr. Sheppard Coil via phone-will start iron 325 mg daily-also update a CBC tomorrow.  Also will test for occult stool bleeding 3  #2-leukocytosis-she does not show signs of sepsis at this time will update a lab-again per chart review it appears her white count was 12 actually back in May -- possibly there may be some chronicity to this will see what the updated lab tells [place].  #3 constipation will start MiraLAX daily and monitor   [id number]      Leida Luton C,

## 2015-04-22 ENCOUNTER — Non-Acute Institutional Stay (SKILLED_NURSING_FACILITY): Payer: Medicare Other | Admitting: Internal Medicine

## 2015-04-22 ENCOUNTER — Encounter: Payer: Self-pay | Admitting: Internal Medicine

## 2015-04-22 DIAGNOSIS — M1711 Unilateral primary osteoarthritis, right knee: Secondary | ICD-10-CM

## 2015-04-22 DIAGNOSIS — M179 Osteoarthritis of knee, unspecified: Secondary | ICD-10-CM

## 2015-04-22 DIAGNOSIS — E1151 Type 2 diabetes mellitus with diabetic peripheral angiopathy without gangrene: Secondary | ICD-10-CM | POA: Diagnosis not present

## 2015-04-22 DIAGNOSIS — I10 Essential (primary) hypertension: Secondary | ICD-10-CM | POA: Diagnosis not present

## 2015-04-22 DIAGNOSIS — E0841 Diabetes mellitus due to underlying condition with diabetic mononeuropathy: Secondary | ICD-10-CM

## 2015-04-22 NOTE — Progress Notes (Signed)
Patient ID: ANH WINFREY, female   DOB: 1951/04/15, 64 y.o.   MRN: [id number] MRN: [id number] Name: Christina Solis  Sex: female Age: 64 y.o. DOB: 1950/06/03  [place] #: [facility] Facility/Room:509 Level Of Care: SNF Provider: Wille Celeste Emergency Contacts: Extended Emergency Contact Information Primary Emergency Contact: Elzie Rings, [place] [employer] Phone: [phone] Relation: Brother Secondary Emergency Contact: Ninfa [place] Mobile Phone: [phone] Relation: Daughter  Code Status:   Allergies: Coconut oil  Chief Complaint  Patient presents with  . Discharge Note    HPI: Patient is 64 y.o. female with HTN,CHF,fibromyalgia HLD, COPD,and anxiety who was admitted to [facility] from 11/12-17 for a L knee replacement. Pt was admitted to SNF for OT/PT. While at SNF pt  followed for HTN, tx with zestoretic, HLD, tx with zetia and crestor and diabetic polyneuropathy tx with lyrica. Shortly after admission there was concern about increased pain and edema of her left leg however Doppler was negative for DVT in the edema appears to be improving.  Currently she does not have any acute complaints she has been started on iron for suspicion of postop anemia hemoglobin appears to be trending up at 8.9 per most recent lab this will need updating by home health.  She is on aspirin for anticoagulation DVT prophylaxis will attempt to obtain orthopedic evaluation on whether this should continue on discharge from facility.  Past Medical History  Diagnosis Date  . Hypertension   . COPD (chronic obstructive pulmonary disease) ([place])   . 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 Irregular heart beat   . Fibromyalgia   . Anxiety   . CHF (congestive heart failure) ([place])   . Hyperlipidemia   . Complication of anesthesia     hard to put under  . Dysrhythmia   . GERD (gastroesophageal  reflux disease)   . Family history of anesthesia complication     sister hard to wake up  . Family history of anesthesia complication     niece have itching  . Chronic bronchitis ([place])     "got it q yr when I lived in [place]"  . Pneumonia     "twice"  . Type II diabetes mellitus ([place])   . Anemia     sickle cell trait  . Migraine     "used to get them alot; not regular anymore" (10/12/2013)  . Arthritis     "knees" (10/12/2013)  . Chronic back pain     Past Surgical History  Procedure Laterality Date  . Back surgery    . Cesarean section  1974; 1976; 1978  . Tonsillectomy    . Appendectomy    . Colonoscopy    . Laparoscopic cholecystectomy  10/12/2013  . Tubal ligation  1978  . Femoral artery stent Left 06/28/2012    SFA  . Posterior lumbar fusion    . Cardiac catheterization    . Cholecystectomy N/A 10/12/2013    Procedure: LAPAROSCOPIC CHOLECYSTECTOMY WITH INTRAOPERATIVE CHOLANGIOGRAM;  Surgeon: [name]. Georgette Dover, MD;  Location: [place];  Service: General;  Laterality: N/A;  . Left heart catheterization with coronary angiogram N/A 06/28/2012    Procedure: LEFT HEART CATHETERIZATION WITH CORONARY ANGIOGRAM;  Surgeon: Laverda Page, MD;  Location: [facility] CATH LAB;  Service: Cardiovascular;  Laterality: N/A;  . Lower extremity angiogram N/A 06/28/2012    Procedure: LOWER  EXTREMITY ANGIOGRAM;  Surgeon: Laverda Page, MD;  Location: [facility] CATH LAB;  Service: Cardiovascular;  Laterality: N/A;  . Abdominal aortagram N/A 06/28/2012    Procedure: ABDOMINAL Maxcine Ham;  Surgeon: Laverda Page, MD;  Location: [facility] CATH LAB;  Service: Cardiovascular;  Laterality: N/A;      Medication List       This list is accurate as of: 04/22/15 11:59 PM.  Always use your most recent med list.               aspirin 325 MG tablet  Take 325 mg by mouth daily.     budesonide-formoterol 160-4.5 MCG/ACT inhaler  Commonly known as:  SYMBICORT  Inhale 2 puffs into the lungs 2 (two) times daily.      diazepam 5 MG tablet  Commonly known as:  VALIUM  Take 5 mg by mouth every 12 (twelve) hours as needed for anxiety.     dicyclomine 20 MG tablet  Commonly known as:  BENTYL  Take 20 mg by mouth 4 (four) times daily as needed for spasms.     ezetimibe 10 MG tablet  Commonly known as:  ZETIA  Take 10 mg by mouth daily with supper.     famotidine 40 MG tablet  Commonly known as:  PEPCID  Take 40 mg by mouth 2 (two) times daily.     insulin lispro protamine-lispro (75-25) 100 UNIT/ML Susp injection  Commonly known as:  HUMALOG 75/25 MIX  Inject 22 Units into the skin 2 (two) times daily with a meal.     lisinopril-hydrochlorothiazide 20-12.5 MG tablet  Commonly known as:  PRINZIDE,ZESTORETIC  Take 1 tablet by mouth daily.     meloxicam 15 MG tablet  Commonly known as:  MOBIC  TAKE 1 TABLET BY MOUTH DAILY     methocarbamol 500 MG tablet  Commonly known as:  ROBAXIN  Take 1,000 mg by mouth 4 (four) times daily.     onetouch ultrasoft lancets     ONETOUCH VERIO test strip  Generic drug:  glucose blood     oxyCODONE-acetaminophen 5-325 MG tablet  Commonly known as:  PERCOCET/ROXICET  Take 1 tablet by mouth every 4 (four) hours as needed.     pregabalin 200 MG capsule  Commonly known as:  LYRICA  Take 200 mg by mouth 3 (three) times daily.     PROAIR HFA 108 (90 BASE) MCG/ACT inhaler  Generic drug:  albuterol  Inhale 2 puffs into the lungs every 6 (six) hours as needed for wheezing or shortness of breath. ProAir     tiZANidine 4 MG tablet  Commonly known as:  ZANAFLEX  TAKE 1/2 TABLET BY MOUTH THREE TIMES DAILY     Vitamin D (Ergocalciferol) 50000 UNITS Caps capsule  Commonly known as:  DRISDOL  Take 50,000 Units by mouth every 7 (seven) days.       Of note iron has been started as well      There is no immunization history on file for this patient.  Social History  Substance Use Topics  . 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 Smokeless tobacco: Never Used  . 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"    Family history is + CA, DM2   Review of Systems  DATA OBTAINED: from patient GENERAL:  no fevers, fatigue, appetite changes SKIN: No  itching, rash or wounds EYES: No eye pain, redness, discharge EARS: No earache, tinnitus, change in hearing NOSE: No congestion, drainage or bleeding  MOUTH/THROAT: No mouth or tooth pain, No sore throat RESPIRATORY: No cough, wheezing, SOB CARDIAC: No chest pain, palpitations, lL ower extremity edema  GI: No abdominal pain, No N/V/D or constipation, No heartburn or reflux  GU: No dysuria, frequency or urgency, or incontinence  MUSCULOSKELETAL-pain and edema in left leg appears to be improving NEUROLOGIC: No headache, dizziness or focal weakness PSYCHIATRIC: No c/o anxiety or sadness   Filed Vitals:   04/22/15 1532  BP: 128/67  Pulse: 72  Temp: 97.1 F (36.2 C)  Resp: 18    SpO2 Readings from Last 1 Encounters:  10/27/14 100%        Physical Exam  GENERAL APPEARANCE: Alert, conversant,  No acute distress.  SKIN: No diaphoresis rash HEAD: Normocephalic, atraumatic  EYES: Conjunctiva/lids clear. Pupils round, reactive. EOMs intact.  EARS: External exam WNL, canals clear. Hearing grossly normal.  NOSE: No deformity or discharge.  MOUTH/THROAT: Oropharynx is clear mucous membranes moist  RESPIRATORY: Breathing is even, unlabored. Lung sounds are clear   CARDIOVASCULAR: Heart RRR no murmurs, rubs or gallops. Continues to have some left lower extremity edema but this is improving pedal pulses palpable  GASTROINTESTINAL: Abdomen is soft, non-tender, not distended w/ normal bowel sounds. GENITOURINARY: Bladder non tender, not distended  MUSCULOSKELETAL: No abnormal joints or musculature surgical site left knee appears unremarkable there is a well-healed surgical scar Steri-Strips are still  in place there is crusting NEUROLOGIC:  Cranial nerves 2-12 grossly intact. Moves all extremities  PSYCHIATRIC: Mood and affect appropriate to situation, no behavioral issues  Patient Active Problem List   Diagnosis Date Noted  . Anemia, unspecified 04/08/2015  . Hemorrhoids 04/08/2015  . Osteoarthritis of right knee 04/05/2015  . HTN (hypertension) 04/05/2015  . Neuropathy in diabetes ([place]) 04/05/2015  . Hyperlipidemia 04/05/2015  . Pain and swelling of left lower leg 04/05/2015  . Chronic cholecystitis with calculus 09/15/2013  . Diabetes mellitus type 2 with peripheral artery disease ([place]) 06/28/2012  . Claudication in peripheral vascular disease ([place]) 06/28/2012  . Fibromyalgia 06/20/2012  . Shoulder pain 06/20/2012  . Lumbar postlaminectomy syndrome 06/20/2012    Labs.  04/09/2015.  WBC 12.8 hemoglobin 8.9 platelets 339.  Sodium 138 potassium 4.2 BUN 19 creatinine 0.9  CBC    Component Value Date/Time   WBC 12.0* 10/12/2013 2212   RBC 3.80* 10/12/2013 2212   HGB 11.2* 10/12/2013 2212   HCT 35.1* 10/12/2013 2212   PLT 338 10/12/2013 2212   MCV 92.4 10/12/2013 2212   LYMPHSABS 4.3* 09/03/2013 2311   MONOABS 0.6 09/03/2013 2311   EOSABS 0.2 09/03/2013 2311   BASOSABS 0.1 09/03/2013 2311    CMP     Component Value Date/Time   NA 143 10/04/2013 0937   K 4.1 10/04/2013 0937   CL 105 10/04/2013 0937   CO2 25 10/04/2013 0937   GLUCOSE 133* 10/04/2013 0937   BUN 21 10/04/2013 0937   CREATININE 0.92 10/12/2013 2212   CALCIUM 10.1 10/04/2013 0937   PROT 7.8 09/03/2013 2311   ALBUMIN 3.3* 09/03/2013 2311   AST 11 09/03/2013 2311   ALT 7 09/03/2013 2311   ALKPHOS 64 09/03/2013 2311   BILITOT 0.2* 09/03/2013 2311   GFRNONAA 66* 10/12/2013 2212   GFRAA 76* 10/12/2013 2212    Lab Results  Component Value Date   HGBA1C 6.9* 10/12/2013     Ct  Cervical Spine Wo Contrast  10/27/2014  CLINICAL DATA:  64 year old female with posterior cervical spine pain  radiating down right side for 3-4 days. No known injury. EXAM: CT CERVICAL SPINE WITHOUT CONTRAST TECHNIQUE: Multidetector CT imaging of the cervical spine was performed without intravenous contrast. Multiplanar CT image reconstructions were also generated. COMPARISON:  None. FINDINGS: Straightening of the normal cervical lordosis identified. There is no evidence of fracture, subluxation or prevertebral soft tissue swelling. No focal bony lesions are identified. Minimal degenerative disc disease/disc space narrowing at C5-6 identified. There is no evidence of bony foraminal narrowing. There may be mild disc bulges at C3-4, C4-5 and C5-6. Soft tissue structures are unremarkable. IMPRESSION: No evidence of acute abnormality. Minimal degenerative changes at C5-6. Possible mild disc bulges from C3-C6. MRI may be helpful for further evaluation as clinically indicated. Electronically Signed   By: Margarette Canada M.D.   On: 10/27/2014 17:15    Not all labs, radiology exams or other studies done during hospitalization come through on my EPIC note; however they are reviewed by me.    Assessment and Plan 1 history of left knee replacement-at this point appears stable she has done well with therapy will need continued PT and OT as well as a rolling walker to assist with ambulation-she is on aspirin for DVT prophylaxis we'll have orthopedics contacted about the duration of this clinically appears to be stable-her lower extremity edema appears to be resolving Doppler was negative for DVT.  #2 anemia most likely postop hemoglobin is slowly rising she has been started on iron.----Home health to update CBC BMP and notify primary care provider of results  #3 hypertension at this point appears stable on Zestoretic --recent blood pressures 128/67-125/70.  #4 history of diabetes she is on 22 units twice a day of [address]-she also is on an Ace and statin blood sugars appear to be stable 120-140s area.  #5 hyperlipidemia again  we have not been aggressive in pursuing a lipid panel secondary to her relatively short stay here she is on Zetia and Crestor  #6-history of neuropathy suspect diabetic related this appears to be relatively stable on Lyrica.   Of note in addition to PTOT and a rolling walker patient will need nursing support for multiple medical issues including limited ambulatory capacity status post knee replacement as well as her history of diabetes and hypertension and anemia among other issues.  [id number] note greater than 30 minutes spent on this discharge summary-greater than 50% of time spent coordinating plan of care-prescriptions have been written    Maleeha Halls C,

## 2015-04-23 ENCOUNTER — Other Ambulatory Visit: Payer: Self-pay | Admitting: Internal Medicine

## 2015-09-09 NOTE — H&P (Signed)
OFFICE VISIT NOTES COPIED TO EPIC FOR DOCUMENTATION  . History of Present Illness Laverda Page MD; September 03, 2015 5:44 PM) Patient words: Last O/V 12/31/2014; 6 Mth F/U.  The patient is a 65 year old female who presents for a follow-up for Peripheral vascular disease. [name] is a AA female who presents for 6 month f/u for follow-up of peripheral arterial disease, tobacco use disorder (quit since Jan 2016) and chronic dyspnea. Has history of left SFA stenting and residual right SFA disease for which medical therapy had helped with symptoms. She underwent left knee replacement about 6 months ago, and states that she feels well and has not had any recurrence of claudication in her left leg however she has noticed discomfort in her right calf on walking which limits her activity, however she thought that his is related to her using right leg more often than the left due to recent knee replacement. She has not noticed any bluish discolorationor ulceration  She has chronic mild dyspnea, states that this is stable. She has not gained any additional weight since last office visit 6 months ago, in fact has lost about 12-15 Lbs. She remains abstinant from tobacco use. No chest pain or palpitations.   Problem List/Past Medical (April Garrison; 09-03-15 3:35 PM) Shortness of breath (R06.02)  Echo- 10/11/13 1. Left ventricle cavity is normal in size. Mild Concentric hypertrophy of the left ventricle. Normal global wall motion. Normal systolic function. Visual EF is 55-60%. Doppler evidence of grade I (impaired) diastolic dysfunction. 2. Left atrial cavity is borderline dilated. Mild aneurysm of interatrial septum. 3. Normal mitral valve with mild regurgitation. 4. Normal tricuspid valve with mild regurgitation. Mildly elevated PA syst. pressure of 34 mm of Hg. IVC is mildly dilated with respiratory variation. 5. No diagnostic change c.f. 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 Controlled type 2 diabetes mellitus with diabetic peripheral angiopathy without gangrene, with long-term current use of insulin (E11.51)  Hypertension, benign (I10)  Claudication, intermittent (I73.9) 06/28/2012 Lower extremity arterial duplex 08/06/2015: Right lower extremity study with stenotic waveforms diffusely suggest probably long segment disease throughout the right CFA and SFA. No hemodynamically significant stenoses are identified in the left lower extremity arterial system. This exam reveals moderately decreased perfusion of the right lower extremity with ABI 0.60 and mildly decreased perfusion of the left lower extremity with ABI 0.80, noted at the post tibial artery level. Compared to the study done on 12/26/14, left ABI reduced from 0.80 and right fron 1.0 with new right femoral artery stenosis. Left SFA stent appears patent. Consider further work-up. Peripheral arteriogram 06/28/2012 successful PTA and stenting of the left SFA with 7.0 x 60 mm Zilver self-expanding stent. Right SFA has residual 70-80% stenosis. Three-vessel runoff low the knee bilaterally. Lower extremity arterial duplex 12/26/2014: No hemodynamically significant stenoses are identified in the bilateral lower extremity arterial system.This exam reveals normal perfusion of the right lower extremity with ABI of 1.00 and mildly decreased perfusion of the left lower extremity, with ABI of 0.96 noted at the post tibial artery level. Study suggests patent left SFA stent. No significant change since study dated 10/03/2014. Compared to the study done on 05/09/12, ABI improved from RABI 0.70, LABI 0.61. Tobacco use disorder [id number])   Allergies (April Garrison; 09/03/15 3:34 PM) No Known Drug Allergies 04/25/2012  Family History (April Garrison; 09-03-15 3:34 PM) Mother  Deceased. at age 75 kidney failure Father  Deceased.  at age 23 from  pancreatric cancer Siblings  12 siblings ( 1 brother died of MI at age 58)  Social History (April Garrison; Sep 10, 2015 3:34 PM) Current tobacco use  Former smoker. Quit January 2016 Non Drinker/No Alcohol Use  Marital status  Divorced. Living Situation  lives with daughter Number of Children  3.  Past Surgical History (April Garrison; September 10, 2015 3:36 PM) Cesarean Section - 3 or more  in 1974, 1976 and 1978 Back Surgery  two back surgeries ( 1 in 1993 and 1995) Surgery wrist and elbow to repair nerve damage  Right. Heart Cath 03/2010 Knee Replacement, Total 03/2015 Left.  Medication History (April Louretta Shorten; 09/10/2015 3:40 PM) Crestor (10MG  Tablet, 1 (one) Tablet Oral daily, Taken starting 12/31/2014) Active. Aspirin Adult Low Strength (81MG  Tablet Chewable, 1 (one) Tablet Chewable Tablet Oral daily, Taken starting 09/07/2013) Active. TiZANidine HCl (2MG  Tablet, 1 Oral every 8 hours as needed) Active. Lisinopril-Hydrochlorothiazide (20-12.5MG  Tablet, 1 Oral daily) Active. Famotidine (40MG  Tablet, 1 Oral two times daily) Active. Gabapentin (300MG  Capsule, 1 Oral three times daily) Active. Lyrica (200MG  Capsule, 1 Oral three times daily) Active. TraMADol HCl (50MG  Tablet, 1 Oral three times daily) Active. Symbicort (160-4.5MCG/ACT Aerosol, 2 puffs Inhalation two times daily) Active. ClonazePAM (0.5MG  Tablet, 1 Oral at bedtime) Discontinued: Condition Improvement. Ondansetron HCl (8MG  Tablet, 1 Oral daily) Active. Zetia (10MG  Tablet, 1 Oral daily) Active. HumaLOG Mix 75/25 KwikPen ((75-25) 100UNIT/ML Susp Pen-inj, inject 22 units Subcutaneous two times daily) Active. ProAir HFA (108 (90 Base)MCG/ACT Aerosol Soln, 1 inhalation Inhalation as needed) Active. Dicyclomine HCl (20MG  Tablet, 1 Oral as needed for pain) Active. Promethazine HCl (25MG  Tablet, 1 Oral two times dialy as needed for nausea) Active. Diazepam (5MG  Tablet, 1 Oral as needed) Discontinued:  Condition Improvement. Meloxicam (15MG  Tablet, 1 Oral daily) Active. Ferrous Sulfate (325 (65 Fe)MG Tablet, 1 Oral daily) Active. Medications Reconciled (Verbally)  Diagnostic Studies History (April Garrison; Sep 10, 2015 3:35 PM) Worthy Keeler  11/01/2014: HbA1c 7.3%, total cholesterol 175, triglycerides 331, HDL 22, LDL 87, serum glucose 165, creatinine 1.1, CMP otherwise normal 10/12/2013: HB 11.2/HCT 35.1, Indicis normal. HbA1c 6.9%. GI bleed from PUD in 2001-2002 07/03/2013: CMP normal. Total cholesterol 210, triglycerides 233, HDL 26, LDL 137. Hemoglobin 12.1/hematocrit 36.7. Reticulocyte count 335. Peripheral arteriogram:  06/28/2012 successful PTA and stenting of the left SFA with 7.0 x 60 mm Zilver self-expanding stent. Right SFA has residual 70-80% stenosis. Three-vessel runoff low the knee bilaterally. Cardiovascular Stress Test 2010 nuclear Colonoscopy 2011 Normal. Endoscopy 2011 Coronary Angiogram 06/28/2012 06/28/2012: Normal coronary arteries. Lower Extremity Dopplers 10/02/2013 1. This exam reveals mildly decreased perfusion of both the lower extremities with RABI 0.86 and LABI 0.95 noted at the post tibial artery level. Left SFA stent is patent. 2. Monophasic waveforms in the right mid SFA suggests proximal occlusion or significant stenosis. 3. Compared to the study done on 05/09/12, ABI improved from RABI 0.70, LABI 0.61. Echocardiogram 10/11/2013 Echo- 10/11/13 1. Left ventricle cavity is normal in size. Mild Concentric hypertrophy of the left ventricle. Normal global wall motion. Normal systolic function. Visual EF is 55-60%. Doppler evidence of grade I (impaired) diastolic dysfunction. 2. Left atrial cavity is borderline dilated. Mild aneurysm of interatrial septum. 3. Normal mitral valve with mild regurgitation. 4. Normal tricuspid valve with mild regurgitation. Mildly elevated PA syst. pressure of 34 mm of Hg. IVC is mildly dilated with respiratory variation. 5. No diagnostic change  c.f. EKG of 05/19/2012  Other Problems (April Garrison; 09-10-2015 3:35 PM) She had an MI at  age 82     Review of Systems Laverda Page MD; 08/15/2015 5:44 PM) General Present- Feeling well. Not Present- Tiredness and Unable to Sleep Lying Flat. HEENT Not Present- Blurred Vision. Respiratory Present- Difficulty Breathing on Exertion (chronic and stable). Not Present- Bloody sputum and Wakes up from Sleep Wheezing or Short of Breath. Cardiovascular Present- Claudications (right calf). Not Present- Edema, Palpitations and Paroxysmal Nocturnal Dyspnea. Gastrointestinal Not Present- Black, Tarry Stool, Bloody Stool and Heartburn. Musculoskeletal Present- Back Pain, Claudication and Joint Pain (bilateral knees). Neurological Not Present- Focal Neurological Symptoms. Psychiatric Not Present- Personality Changes and Suicidal Ideation. Hematology Not Present- Blood Clots, Easy Bruising and Nose Bleed.  Vitals (April Garrison; 08/15/2015 3:42 PM) 08/15/2015 3:35 PM Weight: 229.06 lb Height: 62.5in Body Surface Area: 2.04 m Body Mass Index: 41.23 kg/m  Pulse: 83 (Regular)  P.OX: 95% (Room air) BP: 118/64 (Sitting, Left Arm, Standard)       Physical Exam Laverda Page MD; 08/15/2015 5:45 PM) General Mental Status-Alert. General Appearance-Cooperative, Appears stated age, Not in acute distress. Orientation-Oriented X3. Build & Nutrition-Moderately built and Morbidly obese.  Head and Neck Thyroid Gland Characteristics - no palpable nodules, no palpable enlargement.  Chest and Lung Exam Palpation Tender - No chest wall tenderness. Auscultation Breath sounds - Clear.  Cardiovascular Inspection Jugular vein - Right - No Distention. Auscultation Heart Sounds - S1 WNL, S2 WNL and No gallop present. Murmurs & Other Heart Sounds - Murmur - No murmur.  Abdomen Inspection Contour - Obese and Pannus present. Palpation/Percussion Normal exam - Non Tender and  No hepatosplenomegaly. Auscultation Normal exam - Bowel sounds normal.  Peripheral Vascular Lower Extremity Inspection - Bilateral - Inspection Normal. Palpation - Edema - Bilateral - No edema. Femoral pulse - Bilateral - Normal. Popliteal pulse - Left - Feeble(Pulsus difficult to feel due to patient's bodily habitus.). Right - 1+. Dorsalis pedis pulse - Left - 1+. Right - Absent. Posterior tibial pulse - Bilateral - Absent. Carotid arteries - Bilateral-No Carotid bruit. Abdomen-No prominent abdominal aortic pulsation, No epigastric bruit.  Neurologic Motor-Grossly intact without any focal deficits.  Musculoskeletal - Did not examine.    Assessment & Plan Laverda Page MD; 08/15/2015 5:47 PM)  Claudication, intermittent (I73.9) Story: Lower extremity arterial duplex 08/06/2015: Right lower extremity study with stenotic waveforms diffusely suggest probably long segment disease throughout the right CFA and SFA. No hemodynamically significant stenoses are identified in the left lower extremity arterial system. This exam reveals moderately decreased perfusion of the right lower extremity with ABI 0.60 and mildly decreased perfusion of the left lower extremity with ABI 0.80, noted at the post tibial artery level. Compared to the study done on 12/26/14, left ABI reduced from 0.80 and right fron 1.0 with new right femoral artery stenosis. Left SFA stent appears patent. Consider further work-up.  Peripheral arteriogram 06/28/2012 successful PTA and stenting of the left SFA with 7.0 x 60 mm Zilver self-expanding stent. Right SFA has residual 70-80% stenosis. Three-vessel runoff low the knee bilaterally.  Current Plans Restarted Clopidogrel Bisulfate 75MG , 1 (one) Tablet daily, #90, 08/15/2015, Ref. x1. Future Plans [id number]: METABOLIC PANEL, BASIC (99991111) - one time 09/02/2015: CBC & PLATELETS (AUTO) [id number]) - one time 09/02/2015: PT (PROTHROMBIN TIME) (91478) - one  time  Controlled type 2 diabetes mellitus with diabetic peripheral angiopathy without gangrene, with long-term current use of insulin (E11.51)  Hypercholesteremia (E78.00)  Labwork Story: 11/01/2014: HbA1c 7.3%, total cholesterol 175, triglycerides 331, HDL 22, LDL 87, serum glucose 165, creatinine  1.1, CMP otherwise normal  10/12/2013: HB 11.2/HCT 35.1, Indicis normal. HbA1c 6.9%. GI bleed from PUD in 2001-2002  07/03/2013: CMP normal. Total cholesterol 210, triglycerides 233, HDL 26, LDL 137. Hemoglobin 12.1/hematocrit 36.7. Reticulocyte count 335.  Hypertension, benign (I10) Impression: EKG 08/15/2015: Normal sinus rhythm at the rate of 78/m, left atrial enlargement, nonspecific IVCD, suggests LVH. Nonspecific inferior ST segment sagging probably secondary to LVH, cannot exclude ischemia.   Current Plans Complete electrocardiogram (93000) Mechanism of underlying disease process and action of medications discussed with the patient. I discussed primary/secondary prevention and also dietary counceling was done. She is here on a six-month office visit and follow-up of peripheral arterial disease and claudication. Patient states that her diabetes has been well-controlled now and cholesterol also well controlled, previously had hypertriglyceridemia. She has lost 25 pounds in weight over the past 6 months. She has not noticed worsening symptoms of claudication involving the right leg, she would like to proceed with peripheral arteriogram. I have discussed the findings of the lower extremity arterial duplex with the patient and agree with proceeding with angiogram. I'll make further recommendation after her angiogram. I'll obtain recently performed labs from my evaluation. She is remained abstinent from tobacco for the past 13-14 months.  CC Ms Christina Abrahams, NP-C  Addendum Note(Bridgette Ebony Hail AGNP-C; 09/08/2015 9:59 AM) 09/05/2015: Creatinine 0.98, potassium 3.9, mild increase in WBC and  lymphocytes, CBC otherwise normal, PT/INR normal  Labs stable for PV angiogram  Signed by Laverda Page, MD (08/15/2015 5:48 P<

## 2015-09-10 ENCOUNTER — Ambulatory Visit (HOSPITAL_COMMUNITY)
Admission: RE | Admit: 2015-09-10 | Discharge: 2015-09-10 | Disposition: A | Discharge status: Home / Self Care | Payer: Medicare Other | Source: Ambulatory Visit | Attending: Cardiology | Admitting: Cardiology

## 2015-09-10 ENCOUNTER — Encounter (HOSPITAL_COMMUNITY)
Admission: RE | Disposition: A | Discharge status: Home / Self Care | Payer: Self-pay | Source: Ambulatory Visit | Attending: Cardiology

## 2015-09-10 ENCOUNTER — Encounter (HOSPITAL_COMMUNITY): Payer: Self-pay | Admitting: Cardiology

## 2015-09-10 DIAGNOSIS — E1151 Type 2 diabetes mellitus with diabetic peripheral angiopathy without gangrene: Secondary | ICD-10-CM | POA: Diagnosis not present

## 2015-09-10 DIAGNOSIS — Z794 Long term (current) use of insulin: Secondary | ICD-10-CM | POA: Insufficient documentation

## 2015-09-10 DIAGNOSIS — E7800 Pure hypercholesterolemia, unspecified: Secondary | ICD-10-CM | POA: Diagnosis not present

## 2015-09-10 DIAGNOSIS — I70213 Atherosclerosis of native arteries of extremities with intermittent claudication, bilateral legs: Secondary | ICD-10-CM | POA: Insufficient documentation

## 2015-09-10 DIAGNOSIS — IMO0001 Reserved for inherently not codable concepts without codable children: Secondary | ICD-10-CM | POA: Diagnosis present

## 2015-09-10 DIAGNOSIS — E119 Type 2 diabetes mellitus without complications: Secondary | ICD-10-CM

## 2015-09-10 DIAGNOSIS — Z7982 Long term (current) use of aspirin: Secondary | ICD-10-CM | POA: Insufficient documentation

## 2015-09-10 DIAGNOSIS — I10 Essential (primary) hypertension: Secondary | ICD-10-CM | POA: Insufficient documentation

## 2015-09-10 DIAGNOSIS — Z6841 Body Mass Index (BMI) 40.0 and over, adult: Secondary | ICD-10-CM | POA: Insufficient documentation

## 2015-09-10 DIAGNOSIS — Z87891 Personal history of nicotine dependence: Secondary | ICD-10-CM | POA: Insufficient documentation

## 2015-09-10 DIAGNOSIS — I700 Atherosclerosis of aorta: Secondary | ICD-10-CM | POA: Insufficient documentation

## 2015-09-10 HISTORY — PX: PERIPHERAL VASCULAR CATHETERIZATION: SHX172C

## 2015-09-10 LAB — GLUCOSE, CAPILLARY
GLUCOSE-CAPILLARY: 65 mg/dL (ref 65–99)
GLUCOSE-CAPILLARY: 126 mg/dL — AB (ref 65–99)
GLUCOSE-CAPILLARY: 78 mg/dL (ref 65–99)
Glucose-Capillary: 59 mg/dL — ABNORMAL LOW (ref 65–99)

## 2015-09-10 SURGERY — ABDOMINAL AORTOGRAM W/LOWER EXTREMITY
Anesthesia: LOCAL

## 2015-09-10 MED ORDER — MIDAZOLAM HCL 2 MG/2ML IJ SOLN
INTRAMUSCULAR | Status: AC
  Filled 2015-09-10: qty 2

## 2015-09-10 MED ORDER — LIDOCAINE HCL (PF) 1 % IJ SOLN
INTRAMUSCULAR | Status: DC | PRN
  Administered 2015-09-10: 20 mL

## 2015-09-10 MED ORDER — DEXTROSE 50 % IV SOLN
INTRAVENOUS | Status: AC
  Administered 2015-09-10: 25 mL via INTRAVENOUS
  Filled 2015-09-10: qty 50

## 2015-09-10 MED ORDER — HYDROMORPHONE HCL 1 MG/ML IJ SOLN
INTRAMUSCULAR | Status: AC
  Filled 2015-09-10: qty 1

## 2015-09-10 MED ORDER — HYDROMORPHONE HCL 1 MG/ML IJ SOLN
INTRAMUSCULAR | Status: DC | PRN
  Administered 2015-09-10 (×3): 0.5 mg via INTRAVENOUS

## 2015-09-10 MED ORDER — HEPARIN (PORCINE) IN NACL 2-0.9 UNIT/ML-% IJ SOLN
INTRAMUSCULAR | Status: AC
  Filled 2015-09-10: qty 1000

## 2015-09-10 MED ORDER — ACETAMINOPHEN 325 MG PO TABS: 650.0000 mg | ORAL_TABLET | ORAL | Status: DC | PRN

## 2015-09-10 MED ORDER — MIDAZOLAM HCL 2 MG/2ML IJ SOLN
INTRAMUSCULAR | Status: DC | PRN
  Administered 2015-09-10 (×3): 1 mg via INTRAVENOUS

## 2015-09-10 MED ORDER — SODIUM CHLORIDE 0.9 % IV BOLUS (SEPSIS)
500.0000 mL | Freq: Once | INTRAVENOUS | Status: AC
  Administered 2015-09-10: 500 mL via INTRAVENOUS

## 2015-09-10 MED ORDER — HYDROMORPHONE HCL 1 MG/ML IJ SOLN
INTRAMUSCULAR | Status: AC
  Administered 2015-09-10: 0.5 mg via INTRAVENOUS
  Filled 2015-09-10: qty 1

## 2015-09-10 MED ORDER — HEPARIN (PORCINE) IN NACL 2-0.9 UNIT/ML-% IJ SOLN
INTRAMUSCULAR | Status: DC | PRN
  Administered 2015-09-10: 1000 mL

## 2015-09-10 MED ORDER — SODIUM CHLORIDE 0.9 % IV SOLN: 250.0000 mL | INTRAVENOUS | Status: DC | PRN

## 2015-09-10 MED ORDER — SODIUM CHLORIDE 0.9% FLUSH: 3.0000 mL | Freq: Two times a day (BID) | INTRAVENOUS | Status: DC

## 2015-09-10 MED ORDER — LIDOCAINE HCL (PF) 1 % IJ SOLN
INTRAMUSCULAR | Status: AC
  Filled 2015-09-10: qty 30

## 2015-09-10 MED ORDER — SODIUM CHLORIDE 0.9% FLUSH: 3.0000 mL | INTRAVENOUS | Status: DC | PRN

## 2015-09-10 MED ORDER — HYDROMORPHONE HCL 1 MG/ML IJ SOLN
0.5000 mg | Freq: Once | INTRAMUSCULAR | Status: AC
Start: 2015-09-10 — End: 2015-09-10
  Administered 2015-09-10: 0.5 mg via INTRAVENOUS

## 2015-09-10 MED ORDER — LABETALOL HCL 5 MG/ML IV SOLN
INTRAVENOUS | Status: DC | PRN
  Administered 2015-09-10: 10 mg via INTRAVENOUS

## 2015-09-10 MED ORDER — SODIUM CHLORIDE 0.9 % IV SOLN
INTRAVENOUS | Status: DC
  Administered 2015-09-10: 11:00:00 via INTRAVENOUS

## 2015-09-10 MED ORDER — LABETALOL HCL 5 MG/ML IV SOLN
INTRAVENOUS | Status: AC
  Filled 2015-09-10: qty 4

## 2015-09-10 MED ORDER — SODIUM CHLORIDE 0.9 % IV SOLN
INTRAVENOUS | Status: DC
  Administered 2015-09-10: 09:00:00 via INTRAVENOUS

## 2015-09-10 MED ORDER — DEXTROSE 50 % IV SOLN
25.0000 mL | Freq: Once | INTRAVENOUS | Status: AC
  Administered 2015-09-10: 25 mL via INTRAVENOUS

## 2015-09-10 MED ORDER — ONDANSETRON HCL 4 MG/2ML IJ SOLN: 4.0000 mg | Freq: Four times a day (QID) | INTRAMUSCULAR | Status: DC | PRN

## 2015-09-10 SURGICAL SUPPLY — 9 items
CATH OMNI FLUSH 5F 65CM (CATHETERS) ×1 IMPLANT
KIT MICROINTRODUCER STIFF 5F (SHEATH) ×1 IMPLANT
KIT PV (KITS) ×2 IMPLANT
SHEATH PINNACLE 5F 10CM (SHEATH) ×1 IMPLANT
SYRINGE MEDRAD AVANTA MACH 7 (SYRINGE) ×1 IMPLANT
TRANSDUCER W/STOPCOCK (MISCELLANEOUS) ×2 IMPLANT
TRAY PV CATH (CUSTOM PROCEDURE TRAY) ×2 IMPLANT
TUBING CIL FLEX 10 FLL-RA (TUBING) ×1 IMPLANT
WIRE HITORQ VERSACORE ST 145CM (WIRE) ×1 IMPLANT

## 2015-09-10 NOTE — Interval H&P Note (Signed)
History and Physical Interval Note:  09/10/2015 8:39 AM  Jill Poling  has presented today for surgery, with the diagnosis of claudication  The various methods of treatment have been discussed with the patient and family. After consideration of risks, benefits and other options for treatment, the patient has consented to  Procedure(s): Abdominal Aortogram w/Lower Extremity (N/A) and possible PTA as a surgical intervention .  The patient's history has been reviewed, patient examined, no change in status, stable for surgery.  I have reviewed the patient's chart and labs.  Questions were answered to the patient's satisfaction.     Adrian Prows

## 2015-09-10 NOTE — Progress Notes (Signed)
Site area: Right groin a 5 french arterial sheath was removed  Site Prior to Removal:  Level 0  Pressure Applied For 15 MINUTES    Bedrest Beginning at 1030am  Manual:   Yes.    Patient Status During Pull:  stable  Post Pull Groin Site:  Level 0  Post Pull Instructions Given:  Yes.    Post Pull Pulses Present:  Yes.    Dressing Applied:  Yes.    Comments:  Pt remain stable durng sheath pull.

## 2015-09-10 NOTE — Progress Notes (Signed)
Hypoglycemic Event  CBG: 65  Treatment: D50 IV 25 mL  Symptoms: None  Follow-up CBG: [id number] CBG Result:126  Possible Reasons for Event: Inadequate meal intake     Nechama Guard Tysinger

## 2015-09-10 NOTE — Progress Notes (Signed)
Assumed care of pt from Debbie Hedge, RN. No change noted. 

## 2015-09-10 NOTE — Discharge Instructions (Signed)
Angiogram, Care After °Refer to this sheet in the next few weeks. These instructions provide you with information about caring for yourself after your procedure. Your health care provider may also give you more specific instructions. Your treatment has been planned according to current medical practices, but problems sometimes occur. Call your health care provider if you have any problems or questions after your procedure. °WHAT TO EXPECT AFTER THE PROCEDURE °After your procedure, it is typical to have the following: °· Bruising at the catheter insertion site that usually fades within 1-2 weeks. °· Blood collecting in the tissue (hematoma) that may be painful to the touch. It should usually decrease in size and tenderness within 1-2 weeks. °HOME CARE INSTRUCTIONS °· Take medicines only as directed by your health care provider. °· You may shower 24-48 hours after the procedure or as directed by your health care provider. Remove the bandage (dressing) and gently wash the site with plain soap and water. Pat the area dry with a clean towel. Do not rub the site, because this may cause bleeding. °· Do not take baths, swim, or use a hot tub until your health care provider approves. °· Check your insertion site every day for redness, swelling, or drainage. °· Do not apply powder or lotion to the site. °· Do not lift over 10 lb (4.5 kg) for 5 days after your procedure or as directed by your health care provider. °· Ask your health care provider when it is okay to: °¨ Return to work or school. °¨ Resume usual physical activities or sports. °¨ Resume sexual activity. °· Do not drive home if you are discharged the same day as the procedure. Have someone else drive you. °· You may drive 24 hours after the procedure unless otherwise instructed by your health care provider. °· Do not operate machinery or power tools for 24 hours after the procedure or as directed by your health care provider. °· If your procedure was done as an  outpatient procedure, which means that you went home the same day as your procedure, a responsible adult should be with you for the first 24 hours after you arrive home. °· Keep all follow-up visits as directed by your health care provider. This is important. °SEEK MEDICAL CARE IF: °· You have a fever. °· You have chills. °· You have increased bleeding from the catheter insertion site. Hold pressure on the site. °SEEK IMMEDIATE MEDICAL CARE IF: °· You have unusual pain at the catheter insertion site. °· You have redness, warmth, or swelling at the catheter insertion site. °· You have drainage (other than a small amount of blood on the dressing) from the catheter insertion site. °· The catheter insertion site is bleeding, and the bleeding does not stop after 30 minutes of holding steady pressure on the site. °· The area near or just beyond the catheter insertion site becomes pale, cool, tingly, or numb. °  °This information is not intended to replace advice given to you by your health care provider. Make sure you discuss any questions you have with your health care provider. °  °Document Released: 11/20/2004 Document Revised: 05/25/2014 Document Reviewed: 10/05/2012 °Elsevier Interactive Patient Education ©2016 [employer] ° °

## 2016-02-01 ENCOUNTER — Emergency Department (HOSPITAL_COMMUNITY): Payer: Medicare Other

## 2016-02-01 ENCOUNTER — Encounter (HOSPITAL_COMMUNITY): Payer: Self-pay

## 2016-02-01 ENCOUNTER — Inpatient Hospital Stay (HOSPITAL_COMMUNITY)
Admission: EM | Admit: 2016-02-01 | Discharge: 2016-02-09 | DRG: 190 | Disposition: A | Discharge status: Home / Self Care | Payer: Medicare Other | Attending: Internal Medicine | Admitting: Internal Medicine

## 2016-02-01 DIAGNOSIS — R062 Wheezing: Secondary | ICD-10-CM

## 2016-02-01 DIAGNOSIS — J441 Chronic obstructive pulmonary disease with (acute) exacerbation: Secondary | ICD-10-CM | POA: Diagnosis present

## 2016-02-01 DIAGNOSIS — I2510 Atherosclerotic heart disease of native coronary artery without angina pectoris: Secondary | ICD-10-CM | POA: Diagnosis present

## 2016-02-01 DIAGNOSIS — M797 Fibromyalgia: Secondary | ICD-10-CM | POA: Diagnosis present

## 2016-02-01 DIAGNOSIS — J440 Chronic obstructive pulmonary disease with acute lower respiratory infection: Principal | ICD-10-CM | POA: Diagnosis present

## 2016-02-01 DIAGNOSIS — I447 Left bundle-branch block, unspecified: Secondary | ICD-10-CM

## 2016-02-01 DIAGNOSIS — Z9049 Acquired absence of other specified parts of digestive tract: Secondary | ICD-10-CM | POA: Diagnosis not present

## 2016-02-01 DIAGNOSIS — Z841 Family history of disorders of kidney and ureter: Secondary | ICD-10-CM

## 2016-02-01 DIAGNOSIS — J9600 Acute respiratory failure, unspecified whether with hypoxia or hypercapnia: Secondary | ICD-10-CM | POA: Insufficient documentation

## 2016-02-01 DIAGNOSIS — G8929 Other chronic pain: Secondary | ICD-10-CM | POA: Diagnosis present

## 2016-02-01 DIAGNOSIS — M549 Dorsalgia, unspecified: Secondary | ICD-10-CM | POA: Diagnosis present

## 2016-02-01 DIAGNOSIS — N183 Chronic kidney disease, stage 3 unspecified: Secondary | ICD-10-CM | POA: Insufficient documentation

## 2016-02-01 DIAGNOSIS — Z794 Long term (current) use of insulin: Secondary | ICD-10-CM

## 2016-02-01 DIAGNOSIS — Z981 Arthrodesis status: Secondary | ICD-10-CM

## 2016-02-01 DIAGNOSIS — E1151 Type 2 diabetes mellitus with diabetic peripheral angiopathy without gangrene: Secondary | ICD-10-CM | POA: Diagnosis present

## 2016-02-01 DIAGNOSIS — D573 Sickle-cell trait: Secondary | ICD-10-CM | POA: Diagnosis present

## 2016-02-01 DIAGNOSIS — Z6841 Body Mass Index (BMI) 40.0 and over, adult: Secondary | ICD-10-CM

## 2016-02-01 DIAGNOSIS — E1122 Type 2 diabetes mellitus with diabetic chronic kidney disease: Secondary | ICD-10-CM | POA: Diagnosis present

## 2016-02-01 DIAGNOSIS — Z7982 Long term (current) use of aspirin: Secondary | ICD-10-CM

## 2016-02-01 DIAGNOSIS — E1121 Type 2 diabetes mellitus with diabetic nephropathy: Secondary | ICD-10-CM | POA: Diagnosis present

## 2016-02-01 DIAGNOSIS — R000 Tachycardia, unspecified: Secondary | ICD-10-CM | POA: Diagnosis not present

## 2016-02-01 DIAGNOSIS — K219 Gastro-esophageal reflux disease without esophagitis: Secondary | ICD-10-CM | POA: Diagnosis present

## 2016-02-01 DIAGNOSIS — Z7951 Long term (current) use of inhaled steroids: Secondary | ICD-10-CM

## 2016-02-01 DIAGNOSIS — R911 Solitary pulmonary nodule: Secondary | ICD-10-CM | POA: Diagnosis present

## 2016-02-01 DIAGNOSIS — E785 Hyperlipidemia, unspecified: Secondary | ICD-10-CM | POA: Diagnosis present

## 2016-02-01 DIAGNOSIS — M79609 Pain in unspecified limb: Secondary | ICD-10-CM | POA: Diagnosis not present

## 2016-02-01 DIAGNOSIS — E876 Hypokalemia: Secondary | ICD-10-CM | POA: Diagnosis not present

## 2016-02-01 DIAGNOSIS — I10 Essential (primary) hypertension: Secondary | ICD-10-CM | POA: Diagnosis present

## 2016-02-01 DIAGNOSIS — I130 Hypertensive heart and chronic kidney disease with heart failure and stage 1 through stage 4 chronic kidney disease, or unspecified chronic kidney disease: Secondary | ICD-10-CM | POA: Diagnosis present

## 2016-02-01 DIAGNOSIS — E1165 Type 2 diabetes mellitus with hyperglycemia: Secondary | ICD-10-CM | POA: Diagnosis present

## 2016-02-01 DIAGNOSIS — R9431 Abnormal electrocardiogram [ECG] [EKG]: Secondary | ICD-10-CM | POA: Diagnosis not present

## 2016-02-01 DIAGNOSIS — E118 Type 2 diabetes mellitus with unspecified complications: Secondary | ICD-10-CM

## 2016-02-01 DIAGNOSIS — J189 Pneumonia, unspecified organism: Secondary | ICD-10-CM

## 2016-02-01 DIAGNOSIS — Z87891 Personal history of nicotine dependence: Secondary | ICD-10-CM

## 2016-02-01 DIAGNOSIS — I739 Peripheral vascular disease, unspecified: Secondary | ICD-10-CM | POA: Diagnosis present

## 2016-02-01 DIAGNOSIS — Z833 Family history of diabetes mellitus: Secondary | ICD-10-CM

## 2016-02-01 DIAGNOSIS — E119 Type 2 diabetes mellitus without complications: Secondary | ICD-10-CM

## 2016-02-01 DIAGNOSIS — IMO0002 Reserved for concepts with insufficient information to code with codable children: Secondary | ICD-10-CM

## 2016-02-01 DIAGNOSIS — F419 Anxiety disorder, unspecified: Secondary | ICD-10-CM | POA: Diagnosis present

## 2016-02-01 DIAGNOSIS — I471 Supraventricular tachycardia: Secondary | ICD-10-CM | POA: Diagnosis present

## 2016-02-01 DIAGNOSIS — I5032 Chronic diastolic (congestive) heart failure: Secondary | ICD-10-CM | POA: Diagnosis present

## 2016-02-01 DIAGNOSIS — Z7902 Long term (current) use of antithrombotics/antiplatelets: Secondary | ICD-10-CM

## 2016-02-01 DIAGNOSIS — I4719 Other supraventricular tachycardia: Secondary | ICD-10-CM

## 2016-02-01 DIAGNOSIS — R079 Chest pain, unspecified: Secondary | ICD-10-CM | POA: Diagnosis not present

## 2016-02-01 DIAGNOSIS — IMO0001 Reserved for inherently not codable concepts without codable children: Secondary | ICD-10-CM

## 2016-02-01 DIAGNOSIS — Z79899 Other long term (current) drug therapy: Secondary | ICD-10-CM

## 2016-02-01 LAB — CBC
HCT: 37.1 % (ref 36.0–46.0)
Hemoglobin: 11.7 g/dL — ABNORMAL LOW (ref 12.0–15.0)
MCH: 29.6 pg (ref 26.0–34.0)
MCHC: 31.5 g/dL (ref 30.0–36.0)
MCV: 93.9 fL (ref 78.0–100.0)
PLATELETS: 251 10*3/uL (ref 150–400)
RBC: 3.95 MIL/uL (ref 3.87–5.11)
RDW: 15.5 % (ref 11.5–15.5)
WBC: 15 10*3/uL — ABNORMAL HIGH (ref 4.0–10.5)

## 2016-02-01 LAB — BASIC METABOLIC PANEL
Anion gap: 8 (ref 5–15)
BUN: 21 mg/dL — AB (ref 6–20)
CHLORIDE: 107 mmol/L (ref 101–111)
CO2: 22 mmol/L (ref 22–32)
CREATININE: 1.18 mg/dL — AB (ref 0.44–1.00)
Calcium: 9.3 mg/dL (ref 8.9–10.3)
GFR calc Af Amer: 56 mL/min — ABNORMAL LOW (ref >60)
GFR calc non Af Amer: 48 mL/min — ABNORMAL LOW (ref >60)
GLUCOSE: 157 mg/dL — AB (ref 65–99)
POTASSIUM: 4.1 mmol/L (ref 3.5–5.1)
Sodium: 137 mmol/L (ref 135–145)

## 2016-02-01 LAB — CBG MONITORING, ED: GLUCOSE-CAPILLARY: 168 mg/dL — AB (ref 65–99)

## 2016-02-01 LAB — I-STAT TROPONIN, ED
Troponin i, poc: 0.01 ng/mL (ref 0.00–0.08)
Troponin i, poc: 0 ng/mL (ref 0.00–0.08)

## 2016-02-01 MED ORDER — IPRATROPIUM-ALBUTEROL 0.5-2.5 (3) MG/3ML IN SOLN
3.0000 mL | Freq: Once | RESPIRATORY_TRACT | Status: AC
  Administered 2016-02-01: 3 mL via RESPIRATORY_TRACT
  Filled 2016-02-01: qty 3

## 2016-02-01 MED ORDER — ALBUTEROL SULFATE (2.5 MG/3ML) 0.083% IN NEBU
5.0000 mg | INHALATION_SOLUTION | Freq: Once | RESPIRATORY_TRACT | Status: AC
  Administered 2016-02-01: 5 mg via RESPIRATORY_TRACT

## 2016-02-01 MED ORDER — ALBUTEROL (5 MG/ML) CONTINUOUS INHALATION SOLN
5.0000 mg/h | INHALATION_SOLUTION | RESPIRATORY_TRACT | Status: DC
  Administered 2016-02-01: 5 mg/h via RESPIRATORY_TRACT
  Filled 2016-02-01: qty 20

## 2016-02-01 MED ORDER — DEXTROSE 5 % IV SOLN
500.0000 mg | Freq: Once | INTRAVENOUS | Status: AC
  Administered 2016-02-01: 500 mg via INTRAVENOUS
  Filled 2016-02-01: qty 500

## 2016-02-01 MED ORDER — MORPHINE SULFATE (PF) 4 MG/ML IV SOLN
4.0000 mg | Freq: Once | INTRAVENOUS | Status: AC
Start: 2016-02-01 — End: 2016-02-01
  Administered 2016-02-01: 4 mg via INTRAVENOUS
  Filled 2016-02-01: qty 1

## 2016-02-01 MED ORDER — ONDANSETRON HCL 4 MG/2ML IJ SOLN
4.0000 mg | Freq: Once | INTRAMUSCULAR | Status: AC
  Administered 2016-02-01: 4 mg via INTRAVENOUS
  Filled 2016-02-01: qty 2

## 2016-02-01 MED ORDER — MORPHINE SULFATE (PF) 4 MG/ML IV SOLN
4.0000 mg | Freq: Once | INTRAVENOUS | Status: AC
  Administered 2016-02-01: 4 mg via INTRAVENOUS
  Filled 2016-02-01: qty 1

## 2016-02-01 MED ORDER — ACETAMINOPHEN 500 MG PO TABS
1000.0000 mg | ORAL_TABLET | Freq: Once | ORAL | Status: AC
  Administered 2016-02-01: 1000 mg via ORAL
  Filled 2016-02-01: qty 2

## 2016-02-01 MED ORDER — DEXTROSE 5 % IV SOLN
1.0000 g | INTRAVENOUS | Status: DC
  Administered 2016-02-01: 1 g via INTRAVENOUS
  Filled 2016-02-01 (×2): qty 10

## 2016-02-01 MED ORDER — SODIUM CHLORIDE 0.9 % IV BOLUS (SEPSIS)
500.0000 mL | Freq: Once | INTRAVENOUS | Status: AC
  Administered 2016-02-01: 500 mL via INTRAVENOUS

## 2016-02-01 MED ORDER — METHYLPREDNISOLONE SODIUM SUCC 125 MG IJ SOLR
125.0000 mg | Freq: Once | INTRAMUSCULAR | Status: AC
  Administered 2016-02-01: 125 mg via INTRAVENOUS
  Filled 2016-02-01: qty 2

## 2016-02-01 MED ORDER — IOPAMIDOL (ISOVUE-370) INJECTION 76%
INTRAVENOUS | Status: AC
  Administered 2016-02-01: 100 mL
  Filled 2016-02-01: qty 100

## 2016-02-01 MED ORDER — ALBUTEROL SULFATE (2.5 MG/3ML) 0.083% IN NEBU
INHALATION_SOLUTION | RESPIRATORY_TRACT | Status: AC
  Filled 2016-02-01: qty 6

## 2016-02-01 NOTE — ED Notes (Signed)
Pt not in room at this time

## 2016-02-01 NOTE — ED Notes (Signed)
Pt to CT

## 2016-02-01 NOTE — ED Triage Notes (Signed)
Pt here c/o chest pain and shortness of breath. Pt has audible wheezing and struggles to speak in full sentences.

## 2016-02-01 NOTE — ED Provider Notes (Signed)
[facility] DEPT Provider Note   CSN: [id number] Arrival date & time: 02/01/16  1638    History   Chief Complaint Chief Complaint  Patient presents with  . Chest Pain    HPI Christina Solis is a 65 y.o. female.  The history is provided by the patient, medical records and a relative.    65 year old female with history of CHF, COPD not on home oxygen, CAD, peripheral vascular disease status post LLE stent, DM, hypertension, hyperlipidemia, migraines, pituitary tumor, fibromyalgia presenting with chest pain and shortness of breath. Onset of SOB was 3 days ago and at that time was associated with rhinorrhea and sore throat. Worsening since then, particularly today. Described as wheezing and chest tightness, cough. States feels like worsening COPD. Alleviated by nebulizers at home but not enough. Also associated with chest pain that started today at 12 PM. Constant since then. Located in right as well as left chest. Radiating to right shoulder. Described as tight and sharp/stabbing. Worsened by deep inspiration. Associated with palpitations, diaphoresis, nausea, and one episode of vomiting. Denies fevers. Has chronic leg swelling that is somewhat worse on the left side than right, does not think this is significantly worse recently.   Past Medical History:  Diagnosis Date  . Anemia    sickle cell trait  . Anxiety   . Arthritis    "knees" (10/12/2013)  . CHF (congestive heart failure) ([place])   . Chronic back pain   . Chronic bronchitis ([place])    "got it q yr when I lived in [place]"  . Complication of anesthesia    hard to put under  . COPD (chronic obstructive pulmonary disease) ([place])   . Dysrhythmia   . Family history of anesthesia complication    sister hard to wake up  . Family history of anesthesia complication    niece have itching  . Fibromyalgia   . GERD (gastroesophageal reflux disease)   . Hyperlipidemia   . Hypertension   . Irregular heart beat   . Migraine    "used to  get them alot; not regular anymore" (10/12/2013)  . Pituitary tumor [facility])    followed at [facility]; on cabergoline 09/2013  . Pneumonia    "twice"  . Type II diabetes mellitus [facility])     Patient Active Problem List   Diagnosis Date Noted  . COPD with acute exacerbation ([place]) 02/02/2016  . CAP (community acquired pneumonia) 02/02/2016  . Incidental lung nodule, > 36mm and < 45mm 02/02/2016  . CKD (chronic kidney disease), stage III 02/02/2016  . Acute respiratory failure ([place]) 02/01/2016  . Anemia, unspecified 04/08/2015  . Hemorrhoids 04/08/2015  . Osteoarthritis of right knee 04/05/2015  . HTN (hypertension) 04/05/2015  . Neuropathy in diabetes ([place]) 04/05/2015  . Hyperlipidemia 04/05/2015  . Pain and swelling of left lower leg 04/05/2015  . Chronic cholecystitis with calculus 09/15/2013  . Insulin dependent diabetes mellitus ([place]) 06/28/2012  . PAD (peripheral artery disease) ([place]) 06/28/2012  . Fibromyalgia 06/20/2012  . Shoulder pain 06/20/2012  . Lumbar postlaminectomy syndrome 06/20/2012    Past Surgical History:  Procedure Laterality Date  . ABDOMINAL AORTAGRAM N/A 06/28/2012   Procedure: ABDOMINAL Maxcine Ham;  Surgeon: Laverda Page, MD;  Location: [facility] CATH LAB;  Service: Cardiovascular;  Laterality: N/A;  . APPENDECTOMY    . BACK SURGERY    . CARDIAC CATHETERIZATION    . CESAREAN SECTION  1974; 1976; 1978  . CHOLECYSTECTOMY N/A 10/12/2013   Procedure: LAPAROSCOPIC CHOLECYSTECTOMY WITH INTRAOPERATIVE  CHOLANGIOGRAM;  Surgeon: [name]. Georgette Dover, MD;  Location: [facility];  Service: General;  Laterality: N/A;  . COLONOSCOPY    . FEMORAL ARTERY STENT Left 06/28/2012   SFA  . LAPAROSCOPIC CHOLECYSTECTOMY  10/12/2013  . LEFT HEART CATHETERIZATION WITH CORONARY ANGIOGRAM N/A 06/28/2012   Procedure: LEFT HEART CATHETERIZATION WITH CORONARY ANGIOGRAM;  Surgeon: Laverda Page, MD;  Location: [facility] CATH LAB;  Service: Cardiovascular;  Laterality: N/A;  . LOWER EXTREMITY  ANGIOGRAM N/A 06/28/2012   Procedure: LOWER EXTREMITY ANGIOGRAM;  Surgeon: Laverda Page, MD;  Location: [facility] CATH LAB;  Service: Cardiovascular;  Laterality: N/A;  . PERIPHERAL VASCULAR CATHETERIZATION N/A 09/10/2015   Procedure: Abdominal Aortogram w/Lower Extremity;  Surgeon: Adrian Prows, MD;  Location: [facility] CV LAB;  Service: Cardiovascular;  Laterality: N/A;  . POSTERIOR LUMBAR FUSION    . TONSILLECTOMY    . TUBAL LIGATION  1978    OB History    No data available       Home Medications    Prior to Admission medications   Medication Sig Start Date End Date Taking? Authorizing Provider  albuterol (PROAIR HFA) 108 (90 BASE) MCG/ACT inhaler Inhale 2 puffs into the lungs every 6 (six) hours as needed for wheezing or shortness of breath. ProAir   Yes Historical Provider, MD  albuterol (PROVENTIL) (2.5 MG/3ML) 0.083% nebulizer solution Take 2.5 mg by nebulization every 6 (six) hours as needed for wheezing or shortness of breath.  06/30/15  Yes Historical Provider, MD  aspirin EC 81 MG tablet Take 81 mg by mouth daily.    Yes Historical Provider, MD  budesonide-formoterol (SYMBICORT) 160-4.5 MCG/ACT inhaler Inhale 2 puffs into the lungs 2 (two) times daily.   Yes Historical Provider, MD  clopidogrel (PLAVIX) 75 MG tablet Take 75 mg by mouth daily.  08/15/15  Yes Historical Provider, MD  dicyclomine (BENTYL) 20 MG tablet Take 20 mg by mouth 4 (four) times daily as needed for spasms.  09/06/13  Yes Historical Provider, MD  ezetimibe (ZETIA) 10 MG tablet Take 10 mg by mouth daily with supper.   Yes Historical Provider, MD  famotidine (PEPCID) 40 MG tablet Take 40 mg by mouth 2 (two) times daily.   Yes Historical Provider, MD  ferrous sulfate 325 (65 FE) MG tablet Take 325 mg by mouth daily with breakfast.  07/30/15  Yes Historical Provider, MD  HUMALOG MIX 75/25 KWIKPEN (75-25) 100 UNIT/ML Kwikpen Inject 22 Units into the skin 2 (two) times daily with a meal.  08/08/15  Yes Historical Provider,  MD  Lancets (ONETOUCH ULTRASOFT) lancets  07/06/13  Yes Historical Provider, MD  lisinopril-hydrochlorothiazide (PRINZIDE,ZESTORETIC) 20-12.5 MG per tablet Take 1 tablet by mouth daily.    Yes Historical Provider, MD  meloxicam (MOBIC) 15 MG tablet TAKE 1 TABLET BY MOUTH DAILY 09/26/14  Yes Historical Provider, MD  Menthol-Methyl Salicylate (MUSCLE RUB) 10-15 % CREA Apply 1 application topically as needed for muscle pain.   Yes Historical Provider, MD  methocarbamol (ROBAXIN) 500 MG tablet Take 1,000 mg by mouth 4 (four) times daily.   Yes Historical Provider, MD  [facility] VERIO test strip  08/20/13  Yes Historical Provider, MD  polyethylene glycol powder (GLYCOLAX/MIRALAX) powder Take 17 g by mouth daily as needed for mild constipation.  04/23/15  Yes Historical Provider, MD  pregabalin (LYRICA) 200 MG capsule Take 200 mg by mouth 3 (three) times daily.   Yes Historical Provider, MD  promethazine (PHENERGAN) 25 MG tablet Take 25 mg by mouth every  6 (six) hours as needed for nausea or vomiting.  07/30/15  Yes Historical Provider, MD  rosuvastatin (CRESTOR) 10 MG tablet Take 10 mg by mouth daily.  09/02/15  Yes Historical Provider, MD  tiZANidine (ZANAFLEX) 4 MG tablet Take 2 mg by mouth three times a day 10/03/14  Yes Historical Provider, MD  traMADol (ULTRAM) 50 MG tablet Take 100 mg by mouth 2 (two) times daily as needed for moderate pain.  08/19/15  Yes Historical Provider, MD  Vitamin D, Ergocalciferol, (DRISDOL) 50000 UNITS CAPS capsule Take 50,000 Units by mouth every Monday.    Yes Historical Provider, MD    Family History Family History  Problem Relation Age of Onset  . Diabetes Mother   . Cancer Father   . Kidney disease Brother     Social History Social History  Substance Use Topics  . Smoking status: Former Smoker    Packs/day: 0.50    Years: 46.00    Types: Cigarettes    Start date: 06/20/2000  . Smokeless tobacco: Never Used  . 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 Negative for fever.  HENT: Positive for rhinorrhea and sore throat.   Respiratory: Positive for cough, shortness of breath and wheezing.   Cardiovascular: Positive for chest pain, palpitations and leg swelling.  Gastrointestinal: Positive for nausea and vomiting. Negative for abdominal pain.  Genitourinary: Negative for decreased urine volume and dysuria.  Skin: Negative for rash.  Allergic/Immunologic: Negative for immunocompromised state.  All other systems reviewed and are negative.   Physical Exam Updated Vital Signs BP 150/72   Pulse 100   Temp 98 F (36.7 C) (Rectal)   Resp 24   Ht 5\' 2"  (1.575 m)   Wt 103.9 kg   SpO2 93%   BMI 41.88 kg/m   Physical Exam  Constitutional: She appears well-developed and well-nourished. She appears distressed.  HENT:  Head: Normocephalic and atraumatic.  Eyes: Conjunctivae are normal.  Neck: Neck supple.  Cardiovascular: Regular rhythm.  Tachycardia present.   No murmur heard. Pulmonary/Chest: She is in respiratory distress. She has wheezes. She exhibits no tenderness.  Tachypneic, increased work of breathing and significant inspiratory and expiratory wheezing  Abdominal: Soft. There is no tenderness.  Musculoskeletal: She exhibits edema (bilat LE pitting 1-2+ ) and tenderness (TTP left groin ).  Neurological: She is alert.  Skin: Skin is warm and dry. She is not diaphoretic.  Psychiatric: She has a normal mood and affect.  Nursing note and vitals reviewed.   ED Treatments / Results  Labs (all labs ordered are listed, but only abnormal results are displayed) Labs Reviewed  BASIC METABOLIC PANEL - Abnormal; Notable for the following:       Result Value   Glucose, Bld 157 (*)    BUN 21 (*)    Creatinine, Ser 1.18 (*)    GFR calc non Af Amer 48 (*)    GFR calc Af Amer 56 (*)    All other components within  normal limits  CBC - Abnormal; Notable for the following:    WBC 15.0 (*)    Hemoglobin 11.7 (*)    All other components within normal limits  CBG MONITORING, ED - Abnormal; Notable for the following:    Glucose-Capillary 168 (*)    All other components within normal limits  CULTURE, BLOOD (  ROUTINE X 2)  CULTURE, BLOOD (ROUTINE X 2)  CULTURE, EXPECTORATED SPUTUM-ASSESSMENT  GRAM STAIN  BRAIN NATRIURETIC PEPTIDE  HIV ANTIBODY (ROUTINE TESTING)  STREP PNEUMONIAE URINARY ANTIGEN  LEGIONELLA PNEUMOPHILA SEROGP 1 UR AG  BASIC METABOLIC PANEL  CBC WITH DIFFERENTIAL/PLATELET  Randolm Idol, ED  I-STAT TROPOININ, ED    EKG  EKG Interpretation  Date/Time:  Saturday February 01 2016 16:48:39 EDT Ventricular Rate:  106 PR Interval:  172 QRS Duration: 138 QT Interval:  384 QTC Calculation: 510 R Axis:   81 Text Interpretation:  Sinus tachycardia Non-specific intra-ventricular conduction block --New Cannot rule out Anterior infarct , age undetermined T wave abnormality, consider inferior ischemia Abnormal ECG IVCD and T inversions new v. 2015 Confirmed by Jeneen Rinks  MD, [place] ([id number]) on 02/01/2016 4:53:05 PM       Radiology Ct Angio Chest Pe W And/or Wo Contrast  Result Date: 02/01/2016 CLINICAL DATA:  Chest pain right-sided and midline radiating to back 2 days. Shortness of breath. EXAM: CT ANGIOGRAPHY CHEST WITH CONTRAST TECHNIQUE: Multidetector CT imaging of the chest was performed using the standard protocol during bolus administration of intravenous contrast. Multiplanar CT image reconstructions and MIPs were obtained to evaluate the vascular anatomy. CONTRAST:  Two separate boluses for total of 130 mL Isovue 370. COMPARISON:  None. FINDINGS: Cardiovascular: Mild cardiomegaly. Mild calcified plaque over the thoracic aorta. Pulmonary arterial system is within normal without emboli. Mediastinum/Nodes: No significant mediastinal or hilar adenopathy. Remaining mediastinal structures are  within normal. Lungs/Pleura: Lungs are adequately inflated demonstrate a patchy bilateral predominantly peripheral airspace process. No evidence of effusion. More discrete 5 mm nodule over the posterior right lower lobe. Airways are within normal. Upper Abdomen: Previous cholecystectomy. Musculoskeletal: Within normal. Review of the MIP images confirms the above findings. IMPRESSION: Patchy bilateral predominantly peripheral airspace process. Findings may be due to multifocal pneumonia. Also consider chronic eosinophilic pneumonia. Recommend followup chest CT 4 weeks. No evidence of pulmonary embolism. 5 mm nodule right lower lobe. Recommend followup CT 1 year. This recommendation follows the consensus statement: Guidelines for Management of Small Pulmonary Nodules Detected on CT Scans: A Statement from the [employer] as published in Radiology 2005; 237:395-400. Online at: https://www.arnold.com/. Mild cardiomegaly.  Aortic atherosclerosis. Electronically Signed   By: Marin Olp M.D.   On: 02/01/2016 19:58   Dg Chest Portable 1 View  Result Date: 02/01/2016 CLINICAL DATA:  Shortness of breath with chest pain, sore throat and cough 3 days. EXAM: PORTABLE CHEST 1 VIEW COMPARISON:  03/26/2015 FINDINGS: Lungs are adequately inflated without focal consolidation or effusion. There is mild prominence of the perihilar markings. Mild stable cardiomegaly. Calcified plaque over the aortic arch. Remainder of the exam is unchanged. IMPRESSION: Mild cardiomegaly with suggestion minimal vascular congestion. Aortic atherosclerosis. Electronically Signed   By: Marin Olp M.D.   On: 02/01/2016 18:13    Procedures Procedures (including critical care time)  Medications Ordered in ED Medications  albuterol (PROVENTIL) (2.5 MG/3ML) 0.083% nebulizer solution (not administered)  cefTRIAXone (ROCEPHIN) 1 g in dextrose 5 % 50 mL IVPB (1 g Intravenous New Bag/Given 02/01/16 2246)    albuterol (PROVENTIL,VENTOLIN) solution continuous neb (5 mg/hr Nebulization New Bag/Given 02/01/16 2325)  aspirin EC tablet 81 mg (not administered)  clopidogrel (PLAVIX) tablet 75 mg (not administered)  ferrous sulfate tablet 325 mg (not administered)  polyethylene glycol powder (GLYCOLAX/MIRALAX) container 17 g (not administered)  promethazine (PHENERGAN) tablet 25 mg (not administered)  rosuvastatin (CRESTOR) tablet 10 mg (not administered)  traMADol (ULTRAM)  tablet 100 mg (not administered)  methocarbamol (ROBAXIN) tablet 1,000 mg (not administered)  pregabalin (LYRICA) capsule 200 mg (not administered)  ezetimibe (ZETIA) tablet 10 mg (not administered)  famotidine (PEPCID) tablet 40 mg (not administered)  mometasone-formoterol (DULERA) 200-5 MCG/ACT inhaler 2 puff (not administered)  dicyclomine (BENTYL) tablet 20 mg (not administered)  enoxaparin (LOVENOX) injection 40 mg (not administered)  sodium chloride flush (NS) 0.9 % injection 3 mL (not administered)  sodium chloride flush (NS) 0.9 % injection 3 mL (not administered)  0.9 %  sodium chloride infusion (not administered)  cefTRIAXone (ROCEPHIN) 1 g in dextrose 5 % 50 mL IVPB (not administered)  azithromycin (ZITHROMAX) 500 mg in dextrose 5 % 250 mL IVPB (not administered)  insulin glargine (LANTUS) injection 20 Units (not administered)  insulin aspart (novoLOG) injection 0-15 Units (not administered)  insulin aspart (novoLOG) injection 3 Units (not administered)  insulin aspart (novoLOG) injection 0-5 Units (not administered)  methylPREDNISolone sodium succinate (SOLU-MEDROL) 125 mg/2 mL injection 60 mg (not administered)  ipratropium-albuterol (DUONEB) 0.5-2.5 (3) MG/3ML nebulizer solution 3 mL (not administered)  albuterol (PROVENTIL) (2.5 MG/3ML) 0.083% nebulizer solution 5 mg (5 mg Nebulization Given 02/01/16 1653)  ipratropium-albuterol (DUONEB) 0.5-2.5 (3) MG/3ML nebulizer solution 3 mL (3 mLs Nebulization Given 02/01/16  1757)  sodium chloride 0.9 % bolus 500 mL (0 mLs Intravenous Stopped 02/01/16 1930)  methylPREDNISolone sodium succinate (SOLU-MEDROL) 125 mg/2 mL injection 125 mg (125 mg Intravenous Given 02/01/16 1757)  morphine 4 MG/ML injection 4 mg (4 mg Intravenous Given 02/01/16 1838)  acetaminophen (TYLENOL) tablet 1,000 mg (1,000 mg Oral Given 02/01/16 1838)  iopamidol (ISOVUE-370) 76 % injection (100 mLs  Contrast Given 02/01/16 1851)  azithromycin (ZITHROMAX) 500 mg in dextrose 5 % 250 mL IVPB (500 mg Intravenous New Bag/Given 02/01/16 2247)  morphine 4 MG/ML injection 4 mg (4 mg Intravenous Given 02/01/16 2246)  ondansetron (ZOFRAN) injection 4 mg (4 mg Intravenous Given 02/01/16 2241)     Initial Impression / Assessment and Plan / ED Course  I have reviewed the triage vital signs and the nursing notes.  Pertinent labs & imaging results that were available during my care of the patient were reviewed by me and considered in my medical decision making (see chart for details).  Clinical Course    65 year old female with history of CHF, COPD not on home oxygen, CAD, peripheral vascular disease status post LLE stent, DM, hypertension, hyperlipidemia, migraines, pituitary tumor, fibromyalgia presenting with chest pain and shortness of breath, as above. Upon arrival she is afebrile, tachycardic, with borderline O2 sats on room air.  Initially, exam and history were concerning for COPD exacerbation, with negative chest x-ray for any consolidation. Given Solu-Medrol and nebs with some initial improvement in symptoms. Work of breathing and wheezing worsened during ED stay and patient was placed on continuous albuterol nebs.  There was also initial concern for possible PE given tachycardia, leg edema and TTP in left groin. CTA obtained- negative for PE but with evidence of multifocal pneumonia. Cultures drawn and patient started on Rocephin and azithromycin for community acquired pneumonia.  From cardiac  standpoint, patient remains with chest pain, which I feel is more likely secondary to her respiratory issues. However, she does have a new bundle branch block/IVCD and some T-wave changes on her EKG. Serial troponins here negative. Will need further cardiac eval pending improvement in respiratory status.   Admitted to stepdown unit for further care.  Case discussed with Dr. Jeneen Rinks who oversaw management of this patient.  Final Clinical Impressions(s) / ED Diagnoses   Final diagnoses:  Chest pain, unspecified chest pain type  COPD exacerbation ([place])  CAP (community acquired pneumonia)    New Prescriptions New Prescriptions   No medications on file     Ivin Booty, MD 02/02/16 [place], MD 02/04/16 1456

## 2016-02-02 ENCOUNTER — Encounter (HOSPITAL_COMMUNITY): Payer: Self-pay | Admitting: Family Medicine

## 2016-02-02 DIAGNOSIS — I739 Peripheral vascular disease, unspecified: Secondary | ICD-10-CM

## 2016-02-02 DIAGNOSIS — J189 Pneumonia, unspecified organism: Secondary | ICD-10-CM | POA: Diagnosis present

## 2016-02-02 DIAGNOSIS — R911 Solitary pulmonary nodule: Secondary | ICD-10-CM | POA: Diagnosis present

## 2016-02-02 DIAGNOSIS — Z794 Long term (current) use of insulin: Secondary | ICD-10-CM

## 2016-02-02 DIAGNOSIS — N183 Chronic kidney disease, stage 3 unspecified: Secondary | ICD-10-CM | POA: Insufficient documentation

## 2016-02-02 DIAGNOSIS — E119 Type 2 diabetes mellitus without complications: Secondary | ICD-10-CM

## 2016-02-02 DIAGNOSIS — I10 Essential (primary) hypertension: Secondary | ICD-10-CM

## 2016-02-02 DIAGNOSIS — J441 Chronic obstructive pulmonary disease with (acute) exacerbation: Secondary | ICD-10-CM | POA: Diagnosis present

## 2016-02-02 DIAGNOSIS — R079 Chest pain, unspecified: Secondary | ICD-10-CM

## 2016-02-02 LAB — TROPONIN I: Troponin I: <0.03 ng/mL (ref <0.03)

## 2016-02-02 LAB — CBC WITH DIFFERENTIAL/PLATELET
BASOS ABS: 0 10*3/uL (ref 0.0–0.1)
BASOS PCT: 0 %
EOS PCT: 0 %
Eosinophils Absolute: 0 10*3/uL (ref 0.0–0.7)
HCT: 36 % (ref 36.0–46.0)
Hemoglobin: 11.2 g/dL — ABNORMAL LOW (ref 12.0–15.0)
Lymphocytes Relative: 7 %
Lymphs Abs: 0.9 10*3/uL (ref 0.7–4.0)
MCH: 29.3 pg (ref 26.0–34.0)
MCHC: 31.1 g/dL (ref 30.0–36.0)
MCV: 94.2 fL (ref 78.0–100.0)
MONO ABS: 0.1 10*3/uL (ref 0.1–1.0)
Monocytes Relative: 1 %
Neutro Abs: 13 10*3/uL — ABNORMAL HIGH (ref 1.7–7.7)
Neutrophils Relative %: 92 %
PLATELETS: 243 10*3/uL (ref 150–400)
RBC: 3.82 MIL/uL — ABNORMAL LOW (ref 3.87–5.11)
RDW: 15.8 % — AB (ref 11.5–15.5)
WBC: 14.1 10*3/uL — ABNORMAL HIGH (ref 4.0–10.5)

## 2016-02-02 LAB — GLUCOSE, CAPILLARY
GLUCOSE-CAPILLARY: 491 mg/dL — AB (ref 65–99)
GLUCOSE-CAPILLARY: 473 mg/dL — AB (ref 65–99)
GLUCOSE-CAPILLARY: 427 mg/dL — AB (ref 65–99)
GLUCOSE-CAPILLARY: 445 mg/dL — AB (ref 65–99)
GLUCOSE-CAPILLARY: 409 mg/dL — AB (ref 65–99)
GLUCOSE-CAPILLARY: 376 mg/dL — AB (ref 65–99)
Glucose-Capillary: 348 mg/dL — ABNORMAL HIGH (ref 65–99)

## 2016-02-02 LAB — GLUCOSE, RANDOM: GLUCOSE: 462 mg/dL — AB (ref 65–99)

## 2016-02-02 LAB — BASIC METABOLIC PANEL
Anion gap: 11 (ref 5–15)
BUN: 18 mg/dL (ref 6–20)
CALCIUM: 9.3 mg/dL (ref 8.9–10.3)
CO2: 22 mmol/L (ref 22–32)
CREATININE: 1.27 mg/dL — AB (ref 0.44–1.00)
Chloride: 103 mmol/L (ref 101–111)
GFR calc Af Amer: 51 mL/min — ABNORMAL LOW (ref >60)
GFR, EST NON AFRICAN AMERICAN: 44 mL/min — AB (ref >60)
GLUCOSE: 425 mg/dL — AB (ref 65–99)
Potassium: 4.3 mmol/L (ref 3.5–5.1)
Sodium: 136 mmol/L (ref 135–145)

## 2016-02-02 LAB — HIV ANTIBODY (ROUTINE TESTING W REFLEX): HIV SCREEN 4TH GENERATION: NONREACTIVE

## 2016-02-02 LAB — MRSA PCR SCREENING: MRSA BY PCR: NEGATIVE

## 2016-02-02 LAB — CBG MONITORING, ED
GLUCOSE-CAPILLARY: 371 mg/dL — AB (ref 65–99)
Glucose-Capillary: 257 mg/dL — ABNORMAL HIGH (ref 65–99)

## 2016-02-02 LAB — BRAIN NATRIURETIC PEPTIDE: B Natriuretic Peptide: 241.7 pg/mL — ABNORMAL HIGH (ref 0.0–100.0)

## 2016-02-02 MED ORDER — INSULIN REGULAR BOLUS VIA INFUSION
0.0000 [IU] | Freq: Three times a day (TID) | INTRAVENOUS | Status: DC
  Filled 2016-02-02: qty 10

## 2016-02-02 MED ORDER — IPRATROPIUM BROMIDE 0.02 % IN SOLN
0.5000 mg | RESPIRATORY_TRACT | Status: DC | PRN
  Administered 2016-02-02 – 2016-02-05 (×3): 0.5 mg via RESPIRATORY_TRACT
  Filled 2016-02-02 (×3): qty 2.5

## 2016-02-02 MED ORDER — INSULIN ASPART 100 UNIT/ML ~~LOC~~ SOLN
7.0000 [IU] | Freq: Once | SUBCUTANEOUS | Status: AC
  Administered 2016-02-02: 7 [IU] via SUBCUTANEOUS
  Filled 2016-02-02: qty 1

## 2016-02-02 MED ORDER — MORPHINE SULFATE (PF) 2 MG/ML IV SOLN
INTRAVENOUS | Status: AC
  Filled 2016-02-02: qty 1

## 2016-02-02 MED ORDER — PREGABALIN 100 MG PO CAPS
200.0000 mg | ORAL_CAPSULE | Freq: Three times a day (TID) | ORAL | Status: DC
Start: 2016-02-02 — End: 2016-02-09
  Administered 2016-02-02 – 2016-02-09 (×22): 200 mg via ORAL
  Filled 2016-02-02 (×5): qty 2
  Filled 2016-02-02: qty 8
  Filled 2016-02-02 (×16): qty 2

## 2016-02-02 MED ORDER — HYDROCODONE-ACETAMINOPHEN 5-325 MG PO TABS
1.0000 | ORAL_TABLET | Freq: Four times a day (QID) | ORAL | Status: DC | PRN
  Administered 2016-02-02 – 2016-02-09 (×12): 1 via ORAL
  Filled 2016-02-02 (×12): qty 1

## 2016-02-02 MED ORDER — FERROUS SULFATE 325 (65 FE) MG PO TABS
325.0000 mg | ORAL_TABLET | Freq: Every day | ORAL | Status: DC
  Administered 2016-02-02 – 2016-02-09 (×8): 325 mg via ORAL
  Filled 2016-02-02 (×8): qty 1

## 2016-02-02 MED ORDER — ASPIRIN EC 81 MG PO TBEC
81.0000 mg | DELAYED_RELEASE_TABLET | Freq: Every day | ORAL | Status: DC
  Administered 2016-02-02 – 2016-02-03 (×2): 81 mg via ORAL
  Filled 2016-02-02 (×2): qty 1

## 2016-02-02 MED ORDER — DILTIAZEM HCL 60 MG PO TABS
60.0000 mg | ORAL_TABLET | Freq: Four times a day (QID) | ORAL | Status: DC
  Administered 2016-02-02 – 2016-02-03 (×3): 60 mg via ORAL
  Filled 2016-02-02 (×3): qty 1

## 2016-02-02 MED ORDER — SODIUM CHLORIDE 0.9 % IV SOLN
INTRAVENOUS | Status: DC
  Administered 2016-02-02: 18:00:00 via INTRAVENOUS

## 2016-02-02 MED ORDER — MORPHINE SULFATE (PF) 2 MG/ML IV SOLN
2.0000 mg | INTRAVENOUS | Status: DC | PRN
Start: 2016-02-02 — End: 2016-02-09
  Administered 2016-02-02 – 2016-02-09 (×11): 2 mg via INTRAVENOUS
  Filled 2016-02-02 (×10): qty 1

## 2016-02-02 MED ORDER — POLYETHYLENE GLYCOL 3350 17 G PO PACK
17.0000 g | PACK | Freq: Every day | ORAL | Status: DC | PRN
  Administered 2016-02-07: 17 g via ORAL
  Filled 2016-02-02: qty 1

## 2016-02-02 MED ORDER — DEXTROSE 50 % IV SOLN: 25.0000 mL | INTRAVENOUS | Status: DC | PRN

## 2016-02-02 MED ORDER — DEXTROSE-NACL 5-0.45 % IV SOLN: INTRAVENOUS | Status: DC

## 2016-02-02 MED ORDER — HYDRALAZINE HCL 20 MG/ML IJ SOLN
10.0000 mg | Freq: Three times a day (TID) | INTRAMUSCULAR | Status: DC | PRN
Start: 2016-02-02 — End: 2016-02-06
  Administered 2016-02-04 – 2016-02-05 (×3): 10 mg via INTRAVENOUS
  Filled 2016-02-02 (×4): qty 1

## 2016-02-02 MED ORDER — LEVALBUTEROL HCL 0.63 MG/3ML IN NEBU
0.6300 mg | INHALATION_SOLUTION | Freq: Four times a day (QID) | RESPIRATORY_TRACT | Status: DC | PRN
  Administered 2016-02-02 – 2016-02-05 (×3): 0.63 mg via RESPIRATORY_TRACT
  Filled 2016-02-02 (×3): qty 3

## 2016-02-02 MED ORDER — AMLODIPINE BESYLATE 5 MG PO TABS: 5.0000 mg | ORAL_TABLET | Freq: Every day | ORAL | Status: DC

## 2016-02-02 MED ORDER — INFLUENZA VAC SPLIT QUAD 0.5 ML IM SUSY: 0.5000 mL | PREFILLED_SYRINGE | INTRAMUSCULAR | Status: DC | PRN

## 2016-02-02 MED ORDER — DEXTROSE 5 % IV SOLN
1.0000 g | INTRAVENOUS | Status: DC
  Administered 2016-02-02 – 2016-02-05 (×3): 1 g via INTRAVENOUS
  Filled 2016-02-02 (×5): qty 10

## 2016-02-02 MED ORDER — DICYCLOMINE HCL 20 MG PO TABS
20.0000 mg | ORAL_TABLET | Freq: Four times a day (QID) | ORAL | Status: DC | PRN
  Filled 2016-02-02: qty 1

## 2016-02-02 MED ORDER — SODIUM CHLORIDE 0.9 % IV SOLN: 250.0000 mL | INTRAVENOUS | Status: DC | PRN

## 2016-02-02 MED ORDER — SODIUM CHLORIDE 0.9 % IV SOLN
INTRAVENOUS | Status: DC
  Administered 2016-02-02: 4.1 [IU]/h via INTRAVENOUS
  Filled 2016-02-02: qty 2.5

## 2016-02-02 MED ORDER — IPRATROPIUM-ALBUTEROL 0.5-2.5 (3) MG/3ML IN SOLN
3.0000 mL | RESPIRATORY_TRACT | Status: DC | PRN
  Administered 2016-02-02 (×2): 3 mL via RESPIRATORY_TRACT
  Filled 2016-02-02 (×2): qty 3

## 2016-02-02 MED ORDER — INSULIN ASPART 100 UNIT/ML ~~LOC~~ SOLN: 0.0000 [IU] | Freq: Every day | SUBCUTANEOUS | Status: DC

## 2016-02-02 MED ORDER — DEXTROSE 5 % IV SOLN
500.0000 mg | INTRAVENOUS | Status: DC
  Administered 2016-02-02 – 2016-02-04 (×3): 500 mg via INTRAVENOUS
  Filled 2016-02-02 (×4): qty 500

## 2016-02-02 MED ORDER — TRAMADOL HCL 50 MG PO TABS
100.0000 mg | ORAL_TABLET | Freq: Two times a day (BID) | ORAL | Status: DC | PRN
  Administered 2016-02-02 – 2016-02-05 (×2): 100 mg via ORAL
  Filled 2016-02-02 (×2): qty 2

## 2016-02-02 MED ORDER — MOMETASONE FURO-FORMOTEROL FUM 200-5 MCG/ACT IN AERO
2.0000 | INHALATION_SPRAY | Freq: Two times a day (BID) | RESPIRATORY_TRACT | Status: DC
  Administered 2016-02-02 – 2016-02-09 (×15): 2 via RESPIRATORY_TRACT
  Filled 2016-02-02 (×2): qty 8.8

## 2016-02-02 MED ORDER — INSULIN ASPART 100 UNIT/ML ~~LOC~~ SOLN
0.0000 [IU] | Freq: Three times a day (TID) | SUBCUTANEOUS | Status: DC
  Administered 2016-02-02: 15 [IU] via SUBCUTANEOUS
  Administered 2016-02-02: 8 [IU] via SUBCUTANEOUS
  Filled 2016-02-02 (×2): qty 1

## 2016-02-02 MED ORDER — METOPROLOL TARTRATE 5 MG/5ML IV SOLN: 5.0000 mg | INTRAVENOUS | Status: DC | PRN

## 2016-02-02 MED ORDER — INSULIN ASPART 100 UNIT/ML ~~LOC~~ SOLN: 3.0000 [IU] | Freq: Three times a day (TID) | SUBCUTANEOUS | Status: DC

## 2016-02-02 MED ORDER — INSULIN GLARGINE 100 UNIT/ML ~~LOC~~ SOLN
20.0000 [IU] | Freq: Every day | SUBCUTANEOUS | Status: DC
Start: 2016-02-03 — End: 2016-02-02

## 2016-02-02 MED ORDER — PNEUMOCOCCAL VAC POLYVALENT 25 MCG/0.5ML IJ INJ: 0.5000 mL | INJECTION | INTRAMUSCULAR | Status: DC | PRN

## 2016-02-02 MED ORDER — SODIUM CHLORIDE 0.9% FLUSH
3.0000 mL | Freq: Two times a day (BID) | INTRAVENOUS | Status: DC
  Administered 2016-02-02 (×3): 3 mL via INTRAVENOUS

## 2016-02-02 MED ORDER — INSULIN GLARGINE 100 UNIT/ML ~~LOC~~ SOLN
20.0000 [IU] | Freq: Every day | SUBCUTANEOUS | Status: DC
  Filled 2016-02-02: qty 0.2

## 2016-02-02 MED ORDER — FAMOTIDINE 20 MG PO TABS
40.0000 mg | ORAL_TABLET | Freq: Two times a day (BID) | ORAL | Status: DC
  Administered 2016-02-02 – 2016-02-09 (×15): 40 mg via ORAL
  Filled 2016-02-02 (×15): qty 2

## 2016-02-02 MED ORDER — SODIUM CHLORIDE 0.9% FLUSH: 3.0000 mL | INTRAVENOUS | Status: DC | PRN

## 2016-02-02 MED ORDER — ENOXAPARIN SODIUM 40 MG/0.4ML ~~LOC~~ SOLN
40.0000 mg | Freq: Every day | SUBCUTANEOUS | Status: DC
  Filled 2016-02-02 (×2): qty 0.4

## 2016-02-02 MED ORDER — ROSUVASTATIN CALCIUM 10 MG PO TABS
10.0000 mg | ORAL_TABLET | Freq: Every day | ORAL | Status: DC
  Administered 2016-02-02 – 2016-02-09 (×8): 10 mg via ORAL
  Filled 2016-02-02 (×8): qty 1

## 2016-02-02 MED ORDER — METHYLPREDNISOLONE SODIUM SUCC 125 MG IJ SOLR
60.0000 mg | Freq: Four times a day (QID) | INTRAMUSCULAR | Status: DC
  Administered 2016-02-02 – 2016-02-04 (×11): 60 mg via INTRAVENOUS
  Filled 2016-02-02 (×10): qty 2

## 2016-02-02 MED ORDER — METOPROLOL TARTRATE 5 MG/5ML IV SOLN
5.0000 mg | Freq: Once | INTRAVENOUS | Status: AC
  Administered 2016-02-02: 5 mg via INTRAVENOUS
  Filled 2016-02-02: qty 5

## 2016-02-02 MED ORDER — METHOCARBAMOL 500 MG PO TABS
1000.0000 mg | ORAL_TABLET | Freq: Three times a day (TID) | ORAL | Status: DC
  Administered 2016-02-02 – 2016-02-09 (×30): 1000 mg via ORAL
  Filled 2016-02-02 (×30): qty 2

## 2016-02-02 MED ORDER — PROMETHAZINE HCL 25 MG PO TABS
25.0000 mg | ORAL_TABLET | Freq: Four times a day (QID) | ORAL | Status: DC | PRN
  Administered 2016-02-02: 25 mg via ORAL
  Filled 2016-02-02: qty 1

## 2016-02-02 MED ORDER — CLOPIDOGREL BISULFATE 75 MG PO TABS
75.0000 mg | ORAL_TABLET | Freq: Every day | ORAL | Status: DC
  Administered 2016-02-02 – 2016-02-09 (×8): 75 mg via ORAL
  Filled 2016-02-02 (×8): qty 1

## 2016-02-02 MED ORDER — ALBUTEROL (5 MG/ML) CONTINUOUS INHALATION SOLN
10.0000 mg/h | INHALATION_SOLUTION | RESPIRATORY_TRACT | Status: DC
  Administered 2016-02-02: 10 mg/h via RESPIRATORY_TRACT

## 2016-02-02 MED ORDER — EZETIMIBE 10 MG PO TABS
10.0000 mg | ORAL_TABLET | Freq: Every day | ORAL | Status: DC
  Administered 2016-02-02 – 2016-02-08 (×7): 10 mg via ORAL
  Filled 2016-02-02 (×8): qty 1

## 2016-02-02 NOTE — ED Notes (Signed)
Pt. oob to the bathroom, gait steady.  

## 2016-02-02 NOTE — H&P (Addendum)
History and Physical    Christina Solis [id number] DOB: 01-Dec-1950 DOA: 02/01/2016  PCP: Christina Blake, MD   Patient coming from: Home   Chief Complaint: CP, SOB, cough, wheeze  HPI: Christina Solis is a 65 y.o. female with medical history significant for peripheral artery disease, insulin dependent diabetes mellitus, hypertension, GERD, fibromyalgia, and COPD who presents to the emergency department with chest discomfort, dyspnea, cough, and wheeze. Patient reports that she had been in her usual state of health until 01/29/2016 when she began to experience exertional dyspnea and worsening in her chronic cough. As symptoms progressed, she also developed a discomfort in the central chest which was worse with deep inspiration or coughing. There has been some chills associated with this, but no fevers. Patient has continued to use her home nebulizer and inhaler but these have seemed to become ineffective over the past couple days and she has been unable to get relief. Chest discomfort is described as a burning sensation in the central chest without radiation and without alleviating factors. She has been wheezing and her cough is increased. She reports the cough to be nonproductive, but has a sensation that she needs to cough something up. Patient denies headache, lightheadedness, change in vision or hearing, or loss of coordination. There has been no abdominal pain, vomiting, diarrhea, melena, or hematochezia.  ED Course: Upon arrival to the ED, patient is found to be afebrile, saturating adequately on room air, mildly tachycardic, and with vitals otherwise stable. EKG demonstrates a sinus tachycardia with rate 106 and nonspecific intraventricular conduction delay. Chest x-ray is notable for mild cardiomegaly and suggestion of mild pulmonary vascular congestion. Chemistry panel features a serum creatinine of 1.18, up from an apparent baseline of 1.05. CBC is notable for a leukocytosis to 15,000 and  a normocytic anemia with hemoglobin of 11.7. Initial troponin is within normal limits at 0.01, and a second troponin repeated 3 hours later is undetectable. There was some concern for possible PE and a CTA was performed with PE protocol. CTA was negative for PE, but notable for patchy bilateral airspace disease likely representing a multifocal pneumonia with follow-up CT recommended in one month. Also noted on CT incidentally is a 5 mm nodule in the right lower lung with follow-up recommended in one year. Patient was given a 500 mL normal saline bolus in the emergency department, duo nebs, 125 mg IV push of Solu-Medrol, and empiric treatment with Rocephin and azithromycin. Symptomatic care was provided with Tylenol and morphine. Tachycardia resolved while still in the emergency department of the patient's respiratory status has improved significantly with nebulizers. She will be admitted to the telemetry unit for ongoing evaluation and management of community-acquired pneumonia and acute exacerbation and COPD.  Review of Systems:  All other systems reviewed and apart from HPI, are negative.  Past Medical History:  Diagnosis Date  . Anemia    sickle cell trait  . Anxiety   . Arthritis    "knees" (10/12/2013)  . CHF (congestive heart failure) ([place])   . Chronic back pain   . Chronic bronchitis ([place])    "got it q yr when I lived in [place]"  . Complication of anesthesia    hard to put under  . COPD (chronic obstructive pulmonary disease) ([place])   . Dysrhythmia   . Family history of anesthesia complication    sister hard to wake up  . Family history of anesthesia complication    niece have itching  . Fibromyalgia   .  GERD (gastroesophageal reflux disease)   . Hyperlipidemia   . Hypertension   . Irregular heart beat   . Migraine    "used to get them alot; not regular anymore" (10/12/2013)  . 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 Pneumonia     "twice"  . Type II diabetes mellitus ([place])     Past Surgical History:  Procedure Laterality Date  . ABDOMINAL AORTAGRAM N/A 06/28/2012   Procedure: ABDOMINAL Maxcine Ham;  Surgeon: Laverda Page, MD;  Location: [facility] CATH LAB;  Service: Cardiovascular;  Laterality: N/A;  . APPENDECTOMY    . BACK SURGERY    . CARDIAC CATHETERIZATION    . CESAREAN SECTION  1974; 1976; 1978  . CHOLECYSTECTOMY N/A 10/12/2013   Procedure: LAPAROSCOPIC CHOLECYSTECTOMY WITH INTRAOPERATIVE CHOLANGIOGRAM;  Surgeon: [name]. Georgette Dover, MD;  Location: [facility];  Service: General;  Laterality: N/A;  . COLONOSCOPY    . FEMORAL ARTERY STENT Left 06/28/2012   SFA  . LAPAROSCOPIC CHOLECYSTECTOMY  10/12/2013  . LEFT HEART CATHETERIZATION WITH CORONARY ANGIOGRAM N/A 06/28/2012   Procedure: LEFT HEART CATHETERIZATION WITH CORONARY ANGIOGRAM;  Surgeon: Laverda Page, MD;  Location: [facility] CATH LAB;  Service: Cardiovascular;  Laterality: N/A;  . LOWER EXTREMITY ANGIOGRAM N/A 06/28/2012   Procedure: LOWER EXTREMITY ANGIOGRAM;  Surgeon: Laverda Page, MD;  Location: [facility] CATH LAB;  Service: Cardiovascular;  Laterality: N/A;  . PERIPHERAL VASCULAR CATHETERIZATION N/A 09/10/2015   Procedure: Abdominal Aortogram w/Lower Extremity;  Surgeon: Adrian Prows, MD;  Location: [place] [facility] CV LAB;  Service: Cardiovascular;  Laterality: N/A;  . POSTERIOR LUMBAR FUSION    . TONSILLECTOMY    . TUBAL LIGATION  1978     reports that she has quit smoking. Her smoking use included Cigarettes. She started smoking about 15 years ago. She has a 23.00 pack-year smoking history. She has never used smokeless tobacco. She reports that she does not drink alcohol or use drugs.  Allergies  Allergen Reactions  . Coconut Oil Hives    Family History  Problem Relation Age of Onset  . Diabetes Mother   . Cancer Father   . Kidney disease Brother      Prior to Admission medications   Medication Sig Start Date End Date Taking? Authorizing Provider  albuterol  (PROAIR HFA) 108 (90 BASE) MCG/ACT inhaler Inhale 2 puffs into the lungs every 6 (six) hours as needed for wheezing or shortness of breath. ProAir   Yes Historical Provider, MD  albuterol (PROVENTIL) (2.5 MG/3ML) 0.083% nebulizer solution Take 2.5 mg by nebulization every 6 (six) hours as needed for wheezing or shortness of breath.  06/30/15  Yes Historical Provider, MD  aspirin EC 81 MG tablet Take 81 mg by mouth daily.    Yes Historical Provider, MD  budesonide-formoterol (SYMBICORT) 160-4.5 MCG/ACT inhaler Inhale 2 puffs into the lungs 2 (two) times daily.   Yes Historical Provider, MD  clopidogrel (PLAVIX) 75 MG tablet Take 75 mg by mouth daily.  08/15/15  Yes Historical Provider, MD  dicyclomine (BENTYL) 20 MG tablet Take 20 mg by mouth 4 (four) times daily as needed for spasms.  09/06/13  Yes Historical Provider, MD  ezetimibe (ZETIA) 10 MG tablet Take 10 mg by mouth daily with supper.   Yes Historical Provider, MD  famotidine (PEPCID) 40 MG tablet Take 40 mg by mouth 2 (two) times daily.   Yes Historical Provider, MD  ferrous sulfate 325 (65 FE) MG tablet Take 325 mg  by mouth daily with breakfast.  07/30/15  Yes Historical Provider, MD  HUMALOG MIX 75/25 KWIKPEN (75-25) 100 UNIT/ML Kwikpen Inject 22 Units into the skin 2 (two) times daily with a meal.  08/08/15  Yes Historical Provider, MD  Lancets (ONETOUCH ULTRASOFT) lancets  07/06/13  Yes Historical Provider, MD  lisinopril-hydrochlorothiazide (PRINZIDE,ZESTORETIC) 20-12.5 MG per tablet Take 1 tablet by mouth daily.    Yes Historical Provider, MD  meloxicam (MOBIC) 15 MG tablet TAKE 1 TABLET BY MOUTH DAILY 09/26/14  Yes Historical Provider, MD  Menthol-Methyl Salicylate (MUSCLE RUB) 10-15 % CREA Apply 1 application topically as needed for muscle pain.   Yes Historical Provider, MD  methocarbamol (ROBAXIN) 500 MG tablet Take 1,000 mg by mouth 4 (four) times daily.   Yes Historical Provider, MD  [facility] VERIO test strip  08/20/13  Yes Historical  Provider, MD  polyethylene glycol powder (GLYCOLAX/MIRALAX) powder Take 17 g by mouth daily as needed for mild constipation.  04/23/15  Yes Historical Provider, MD  pregabalin (LYRICA) 200 MG capsule Take 200 mg by mouth 3 (three) times daily.   Yes Historical Provider, MD  promethazine (PHENERGAN) 25 MG tablet Take 25 mg by mouth every 6 (six) hours as needed for nausea or vomiting.  07/30/15  Yes Historical Provider, MD  rosuvastatin (CRESTOR) 10 MG tablet Take 10 mg by mouth daily.  09/02/15  Yes Historical Provider, MD  tiZANidine (ZANAFLEX) 4 MG tablet Take 2 mg by mouth three times a day 10/03/14  Yes Historical Provider, MD  traMADol (ULTRAM) 50 MG tablet Take 100 mg by mouth 2 (two) times daily as needed for moderate pain.  08/19/15  Yes Historical Provider, MD  Vitamin D, Ergocalciferol, (DRISDOL) 50000 UNITS CAPS capsule Take 50,000 Units by mouth every Monday.    Yes Historical Provider, MD    Physical Exam: Vitals:   02/01/16 2145 02/01/16 2200 02/01/16 2215 02/01/16 2330  BP: 149/91 157/82 150/72 150/72  Pulse: 88 86 84 100  Resp: 18 22 16 24   Temp:      TempSrc:      SpO2: 97% 94% 97% 93%  Weight:      Height:          Constitutional: Mild respiratory distress with accessory muscle recruitment and mild tachypnea, calm, comfortable Eyes: PERTLA, lids and conjunctivae normal ENMT: Mucous membranes are moist. Posterior pharynx clear of any exudate or lesions.   Neck: normal, supple, no masses, no thyromegaly Respiratory: Audible wheezing, coarse rhonchi bilaterally. Increased work of breathing. No pallor or cyanosis.  Cardiovascular: S1 & S2 heard, regular rate and rhythm. No extremity edema. No significant JVD. Abdomen: No distension, no tenderness, no masses palpated. Bowel sounds normal.  Musculoskeletal: no clubbing / cyanosis. No joint deformity upper and lower extremities. Normal muscle tone.  Skin: no significant rashes, lesions, ulcers. Warm, dry,  well-perfused. Neurologic: CN 2-12 grossly intact. Sensation intact, DTR normal. Strength 5/5 in all 4 limbs.  Psychiatric: Normal judgment and insight. Alert and oriented x 3. Normal mood and affect.     Labs on Admission: I have personally reviewed following labs and imaging studies  CBC:  Recent Labs Lab 02/01/16 1647  WBC 15.0*  HGB 11.7*  HCT 37.1  MCV 93.9  PLT 123XX123   Basic Metabolic Panel:  Recent Labs Lab 02/01/16 1647  NA 137  K 4.1  CL 107  CO2 22  GLUCOSE 157*  BUN 21*  CREATININE 1.18*  CALCIUM 9.3   GFR: Estimated Creatinine Clearance: 55.2  mL/min (by C-G formula based on SCr of 1.18 mg/dL (H)). Liver Function Tests: No results for input(s): AST, ALT, ALKPHOS, BILITOT, PROT, ALBUMIN in the last 168 hours. No results for input(s): LIPASE, AMYLASE in the last 168 hours. No results for input(s): AMMONIA in the last 168 hours. Coagulation Profile: No results for input(s): INR, PROTIME in the last 168 hours. Cardiac Enzymes: No results for input(s): CKTOTAL, CKMB, CKMBINDEX, TROPONINI in the last 168 hours. BNP (last 3 results) No results for input(s): PROBNP in the last 8760 hours. HbA1C: No results for input(s): HGBA1C in the last 72 hours. CBG:  Recent Labs Lab 02/01/16 1942  GLUCAP 168*   Lipid Profile: No results for input(s): CHOL, HDL, LDLCALC, TRIG, CHOLHDL, LDLDIRECT in the last 72 hours. Thyroid Function Tests: No results for input(s): TSH, T4TOTAL, FREET4, T3FREE, THYROIDAB in the last 72 hours. Anemia Panel: No results for input(s): VITAMINB12, FOLATE, FERRITIN, TIBC, IRON, RETICCTPCT in the last 72 hours. Urine analysis:    Component Value Date/Time   COLORURINE YELLOW 09/04/2013 0052   APPEARANCEUR CLEAR 09/04/2013 0052   LABSPEC 1.017 09/04/2013 0052   PHURINE 5.0 09/04/2013 0052   GLUCOSEU NEGATIVE 09/04/2013 0052   HGBUR TRACE (A) 09/04/2013 0052   BILIRUBINUR NEGATIVE 09/04/2013 0052   KETONESUR NEGATIVE 09/04/2013 0052    PROTEINUR NEGATIVE 09/04/2013 0052   UROBILINOGEN 1.0 09/04/2013 0052   NITRITE NEGATIVE 09/04/2013 0052   LEUKOCYTESUR NEGATIVE 09/04/2013 0052   Sepsis Labs: @LABRCNTIP (procalcitonin:4,lacticidven:4) )No results found for this or any previous visit (from the past 240 hour(s)).   Radiological Exams on Admission: Ct Angio Chest Pe W And/or Wo Contrast  Result Date: 02/01/2016 CLINICAL DATA:  Chest pain right-sided and midline radiating to back 2 days. Shortness of breath. EXAM: CT ANGIOGRAPHY CHEST WITH CONTRAST TECHNIQUE: Multidetector CT imaging of the chest was performed using the standard protocol during bolus administration of intravenous contrast. Multiplanar CT image reconstructions and MIPs were obtained to evaluate the vascular anatomy. CONTRAST:  Two separate boluses for total of 130 mL Isovue 370. COMPARISON:  None. FINDINGS: Cardiovascular: Mild cardiomegaly. Mild calcified plaque over the thoracic aorta. Pulmonary arterial system is within normal without emboli. Mediastinum/Nodes: No significant mediastinal or hilar adenopathy. Remaining mediastinal structures are within normal. Lungs/Pleura: Lungs are adequately inflated demonstrate a patchy bilateral predominantly peripheral airspace process. No evidence of effusion. More discrete 5 mm nodule over the posterior right lower lobe. Airways are within normal. Upper Abdomen: Previous cholecystectomy. Musculoskeletal: Within normal. Review of the MIP images confirms the above findings. IMPRESSION: Patchy bilateral predominantly peripheral airspace process. Findings may be due to multifocal pneumonia. Also consider chronic eosinophilic pneumonia. Recommend followup chest CT 4 weeks. No evidence of pulmonary embolism. 5 mm nodule right lower lobe. Recommend followup CT 1 year. This recommendation follows the consensus statement: Guidelines for Management of Small Pulmonary Nodules Detected on CT Scans: A Statement from the [employer] as  published in Radiology 2005; 237:395-400. Online at: [email or web address].arnold.com/. Mild cardiomegaly.  Aortic atherosclerosis. Electronically Signed   By: Marin Olp M.D.   On: 02/01/2016 19:58   Dg Chest Portable 1 View  Result Date: 02/01/2016 CLINICAL DATA:  Shortness of breath with chest pain, sore throat and cough 3 days. EXAM: PORTABLE CHEST 1 VIEW COMPARISON:  03/26/2015 FINDINGS: Lungs are adequately inflated without focal consolidation or effusion. There is mild prominence of the perihilar markings. Mild stable cardiomegaly. Calcified plaque over the aortic arch. Remainder of the exam is unchanged. IMPRESSION: Mild cardiomegaly with suggestion minimal  vascular congestion. Aortic atherosclerosis. Electronically Signed   By: Marin Olp M.D.   On: 02/01/2016 18:13    EKG: Independently reviewed. Sinus tachycardia (rate 106), non-specific IVCD   Assessment/Plan  1. CAP  - Multifocal PNA suggested by CT and correlates with her presentation and exam  - Blood and urine cultures are incubating, sputum gram stain and culture requested  - Started on empiric Rocephin and azithromycin, will continue  - Check urine for strep pneumo and legionella antigens  - Monitor with continue pulse oximetry and titrate FiO2 to maintain sat >92%  - Follow-up CT has been recommended in 4 wks    2. COPD with acute exacerbation  - Likely precipitated by the infectious process  - Treated with 125 mg IV Solu-Medrol and DuoNeb in ED  - Continue scheduled ICS/LABA  - Continue IV Solu-Medrol at 60 mg IV q6h for now  - Continue abx as above with Rocephin and azithromycin - Follow-up sputum culture     3. CKD stage III   - SCr 1.18 on admission, slightly up from her apparent baseline of 1.05  - Appears to be a little fluid-up on exam and has received 500 cc NS in ED  - Plan to [facility] for now, allow ad lib PO fluids, hold lisinopril and HCTZ, and repeat chem panel in am    4.  Hypertension - At goal currently  - Managed with lisinopril and HCTZ, holding for now given slight bump in SCr  - Treat with prn hydralazine for now    5. Insulin-dependent DM  - A1c was 6.9% in 2015  - Managed with Humalog 75/25 22 units BID at home, will hold this while in hospital  - Check CBG with meals and qHS  - Start with Lantus 20 units qHS, 3 units Novolog TID with meals, and moderate-intensity sliding-scale correctional   6. PAD  - Stable, s/p bypass  - Continue ASA, Plavix, Crestor   7. Incidental lung nodule  - 5 mm nodule noted in Rt base with follow-up at 1 yr recommended by radiologist    8. Chest pain - Likely secondary to #1 above - First trop 0.01, then 0.00 three hrs later, making ACS highly unlikely  - Monitoring on telemetry    DVT prophylaxis: sq Lovenox  Code Status: Full  Family Communication: Discussed with patient Disposition Plan: Admit to stepdown  Consults called: None Admission status: Inpatient    Vianne Bulls, MD [facility] Pager [phone]  If 7PM-7AM, please contact night-coverage [email or web address] Password [email or web address]  02/02/2016, 12:13 AM

## 2016-02-02 NOTE — ED Notes (Signed)
  CBG 257  

## 2016-02-02 NOTE — Progress Notes (Addendum)
The patient was admitted early today. Please see H&P from today for details.  Patient was seen and examined at 2 different times in the ER today.  Briefly 65 year old female with history of insulin-dependent diabetes, hypertension, acid reflux, fibromyalgia, COPD presented with dyspnea, cough, wheezing and chest discomfort. On arrival to the ER patient was tachycardic, oxygen saturation in room air. Chest x-ray consistent with cardiomegaly and possible vascular congestion. Patient was found to have leukocytosis. CT angiogram of the chest showed no pulmonary embolism however consistent with patchy bilateral airspace disease likely multifocal pneumonia. Patient was restarted on azithromycin, ceftriaxone for the treatment of possible pneumonia. Patient is also receiving bronchodilators and IV Solu-Medrol for possible COPD exacerbation.  During my examination to the patient, she was still short of breath and has diffused expiratory wheezes. She feels somewhat better with bronchodilators. After treatment with albuterol, patient developed tachycardia. -EKG consistent with sinus tachycardia and possible new left bundle branch block. Troponin negative.  BP 149/63, HR 125 Gen: looks jittery after receiving albuterol, NAD. Trying to eat lunch. Respiratory: Bilateral diffuse expiratory wheeze, basal crackle Cardiovascular: Tachycardia, S1 and S2 normal Upper extremity mild tremor GI: Abdomen soft, nontender, bowel sounds positive. Neuro: Alert, awake, following, once. No lower extremity edema.  Plan: -Receiving second dose of Solu-Medrol now. Continue with IV Solu Medrol -Change bronchodilators to Xopenex and Atrovent -Order IV metoprolol for tachycardia. Monitor heart rate and blood pressure closely. Continue telemeter. -Patient is admitted in stepdown unit. -Her oxygen saturation is acceptable. Continue current respiratory therapy and treatment as discussed. -I'm trying to contact cardiologist  regarding abnormal EKG finding. New left bundle branch block was present last night in Er. -Patient has good oral intake. Continue to monitor blood sugar level especially while on steroids. Continue current insulin regimen. -Holding ACE inhibitor and hydrochlorothiazide because of mild elevation in creatinine. Monitor blood pressure level. Hydralazine when necessary ordered. -Patient reported severe diffuse headache, has history of migraine. Not improving with oral pain medication. Ordered morphine when necessary. -Also reporting left lower extremity pain. I will order Doppler ultrasound to rule out DVT.  I will plan discussed with the patient and her nurse in ER in detail. Patient is awaiting stepdown bed.  Continue current medical and supportive care as ordered. DVT prophylaxis with Lovenox subcutaneous.  Addendum: 2:31 Discussed with Dr. Meda Coffee from cardiology. Discussed new finding of EKG (LBBB) and patient's clinical condition. Metoprolol discontinued and started on oral cardizem.  -I will order Echo -Cardiology consult will follow.  Addendum 3:51 pm: received call Dr. Meda Coffee, this is Dr. Irven Shelling patient. I consulted dr. Einar Gip and discussed with him.

## 2016-02-02 NOTE — ED Notes (Signed)
Pt alarms ringing, RN to bedside. Pt noted to be tachycardic and vomiting. Pt speaking in full sentences, no signs of choking. O2 saturation of 98%.

## 2016-02-02 NOTE — ED Notes (Addendum)
Reported to Dr. Carolin Sicks that pt. Was on a 10 Mg Albuterol Treatment and we stopped the treatment due to her HR increased to 135- 140 , ECG done, and Dr. Carolin Sicks is at the bedside.

## 2016-02-02 NOTE — ED Notes (Signed)
[facility] pt.s request.  She would like Morphine or Dilaudid for her headache.  I gave pt. Tramadol and her Robaxin and it has not given her enough relief.

## 2016-02-02 NOTE — ED Notes (Signed)
Patients CBG is 371

## 2016-02-02 NOTE — Consult Note (Addendum)
CARDIOLOGY CONSULT NOTE  Patient ID: Christina Solis MRN: [id number] DOB/AGE: November 02, 1950 65 y.o.  Admit date: 02/01/2016 Referring Physician: Internal Medicine Primary Physician:  Charlett Blake, MD Reason for Consultation: Chest pain, LBBB  HPI: Christina Solis  is a 65 y.o. female with a history of peripheral arterial disease, tobacco use disorder (quit since Jan 2016), chronic dyspnea, hyperlipidemia, insulin-dependent diabetes, and hypertension.  She underwent peripheral arteriogram on 09/10/2015, was found to have moderate disease in bilateral SFA, medical therapy only was recommended.  Left SFA stent was widely patent.  She was last seen on an outpatient basis on 10/03/2015.  At that time, symptoms of claudication and dyspnea were stable.  She underwent coronary angiogram in 2011 and again on 06/28/2012 revealing normal coronary arteries.  Most recent echocardiogram on 10/11/2013 revealing normal LVEF at 55-60% with grade 1 diastolic dysfunction, mildly elevated PASP at 34 mmHg, and no significant valvulopathy.  She presented to the emergency department on 02/02/2016 with a four-day history of exacerbation of chronic cough and exertional dyspnea.  She has a history of COPD.  Nebulizer treatments initially effective at home, however began to quit working, prompting presentation to the hospital.  She also complained of a midsternal chest burning.  Denied any radiation of pain or alleviating factors, but reports that pain is significantly worse with coughing.  She was ruled out for ACS and PE by cardiac enzymes and chest CTA, respectively.  Chest CT, however did reveal a patchy bilateral air space disease, therefore she was admitted for management of community-acquired pneumonia and exacerbation of COPD.  EKG revealed left bundle branch block and, given multiple risk factors and complaints of chest pain, we were consulted for further evaluation.  Past Medical History:  Diagnosis Date  . Anemia     sickle cell trait  . Anxiety   . Arthritis    "knees" (10/12/2013)  . CHF (congestive heart failure) ([place])   . Chronic back pain   . Chronic bronchitis ([place])    "got it q yr when I lived in [place]"  . Complication of anesthesia    hard to put under  . COPD (chronic obstructive pulmonary disease) ([place])   . Dysrhythmia   . Family history of anesthesia complication    sister hard to wake up  . Family history of anesthesia complication    niece have itching  . Fibromyalgia   . GERD (gastroesophageal reflux disease)   . Hyperlipidemia   . Hypertension   . Irregular heart beat   . Migraine    "used to get them alot; not regular anymore" (10/12/2013)  . 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 Pneumonia    "twice"  . Type II diabetes mellitus ([place])      Past Surgical History:  Procedure Laterality Date  . ABDOMINAL AORTAGRAM N/A 06/28/2012   Procedure: ABDOMINAL Maxcine Ham;  Surgeon: Laverda Page, MD;  Location: [facility] CATH LAB;  Service: Cardiovascular;  Laterality: N/A;  . APPENDECTOMY    . BACK SURGERY    . CARDIAC CATHETERIZATION    . CESAREAN SECTION  1974; 1976; 1978  . CHOLECYSTECTOMY N/A 10/12/2013   Procedure: LAPAROSCOPIC CHOLECYSTECTOMY WITH INTRAOPERATIVE CHOLANGIOGRAM;  Surgeon: [name]. Georgette Dover, MD;  Location: [place];  Service: General;  Laterality: N/A;  . COLONOSCOPY    . FEMORAL ARTERY STENT Left 06/28/2012   SFA  . LAPAROSCOPIC CHOLECYSTECTOMY  10/12/2013  . LEFT HEART CATHETERIZATION WITH CORONARY ANGIOGRAM N/A  06/28/2012   Procedure: LEFT HEART CATHETERIZATION WITH CORONARY ANGIOGRAM;  Surgeon: Laverda Page, MD;  Location: [facility] CATH LAB;  Service: Cardiovascular;  Laterality: N/A;  . LOWER EXTREMITY ANGIOGRAM N/A 06/28/2012   Procedure: LOWER EXTREMITY ANGIOGRAM;  Surgeon: Laverda Page, MD;  Location: [facility] CATH LAB;  Service: Cardiovascular;  Laterality: N/A;  . PERIPHERAL VASCULAR CATHETERIZATION N/A 09/10/2015    Procedure: Abdominal Aortogram w/Lower Extremity;  Surgeon: Adrian Prows, MD;  Location: [place] CV LAB;  Service: Cardiovascular;  Laterality: N/A;  . POSTERIOR LUMBAR FUSION    . TONSILLECTOMY    . TUBAL LIGATION  1978     Family History  Problem Relation Age of Onset  . Diabetes Mother   . Cancer Father   . Kidney disease Brother      Social History: Social History   Social History  . Marital status: Divorced    Spouse name: N/A  . Number of children: N/A  . Years of education: N/A   Occupational History  . Not on file.   Social History Main Topics  . Smoking status: Former Smoker    Packs/day: 0.50    Years: 46.00    Types: Cigarettes    Start date: 06/20/2000  . Smokeless tobacco: Never Used  . 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 Drug use: No  . Sexual activity: No   Other Topics Concern  . Not on file   Social History Narrative  . No narrative on file     Prescriptions Prior to Admission  Medication Sig Dispense Refill Last Dose  . albuterol (PROAIR HFA) 108 (90 BASE) MCG/ACT inhaler Inhale 2 puffs into the lungs every 6 (six) hours as needed for wheezing or shortness of breath. ProAir   02/01/2016 at am  . albuterol (PROVENTIL) (2.5 MG/3ML) 0.083% nebulizer solution Take 2.5 mg by nebulization every 6 (six) hours as needed for wheezing or shortness of breath.    01/31/2016 at pm  . aspirin EC 81 MG tablet Take 81 mg by mouth daily.    02/01/2016 at am  . budesonide-formoterol (SYMBICORT) 160-4.5 MCG/ACT inhaler Inhale 2 puffs into the lungs 2 (two) times daily.   02/01/2016 at am  . clopidogrel (PLAVIX) 75 MG tablet Take 75 mg by mouth daily.   1 02/01/2016 at 1200  . dicyclomine (BENTYL) 20 MG tablet Take 20 mg by mouth 4 (four) times daily as needed for spasms.    02/01/2016 at am  . ezetimibe (ZETIA) 10 MG tablet Take 10 mg by mouth daily with supper.   01/31/2016 at pm  . famotidine (PEPCID) 40 MG tablet Take 40 mg by mouth 2 (two) times  daily.   02/01/2016 at am  . ferrous sulfate 325 (65 FE) MG tablet Take 325 mg by mouth daily with breakfast.    02/01/2016 at am  . HUMALOG MIX 75/25 KWIKPEN (75-25) 100 UNIT/ML Kwikpen Inject 22 Units into the skin 2 (two) times daily with a meal.   2 02/01/2016 at am  . Lancets (ONETOUCH ULTRASOFT) lancets    Taking  . lisinopril-hydrochlorothiazide (PRINZIDE,ZESTORETIC) 20-12.5 MG per tablet Take 1 tablet by mouth daily.    02/01/2016 at am  . meloxicam (MOBIC) 15 MG tablet TAKE 1 TABLET BY MOUTH DAILY  0 02/01/2016 at am  . Menthol-Methyl Salicylate (MUSCLE RUB) 10-15 % CREA Apply 1 application topically as needed for muscle pain.   Past Week at Unknown time  .  methocarbamol (ROBAXIN) 500 MG tablet Take 1,000 mg by mouth 4 (four) times daily.   Past Week at Unknown time  . ONETOUCH VERIO test strip    Taking  . polyethylene glycol powder (GLYCOLAX/MIRALAX) powder Take 17 g by mouth daily as needed for mild constipation.    Past Week at Unknown time  . pregabalin (LYRICA) 200 MG capsule Take 200 mg by mouth 3 (three) times daily.   02/01/2016 at am  . promethazine (PHENERGAN) 25 MG tablet Take 25 mg by mouth every 6 (six) hours as needed for nausea or vomiting.    01/31/2016 at pm  . rosuvastatin (CRESTOR) 10 MG tablet Take 10 mg by mouth daily.    01/31/2016 at pm  . tiZANidine (ZANAFLEX) 4 MG tablet Take 2 mg by mouth three times a day  1 01/31/2016 at pm  . traMADol (ULTRAM) 50 MG tablet Take 100 mg by mouth 2 (two) times daily as needed for moderate pain.   1 01/31/2016 at pm  . Vitamin D, Ergocalciferol, (DRISDOL) 50000 UNITS CAPS capsule Take 50,000 Units by mouth every Monday.    01/27/2016 at am     ROS: General: Reports chils; fatigue. No fevers or night sweats Eyes: no blurry vision, diplopia, or amaurosis ENT: no sore throat or hearing loss Resp: Reports worsening of chronic cough, wheezing, and exertional dyspnea, states started with upper respiratory illness; no hemoptysis CV: Reports  burning chest pain; no PND, orthopnea, edema, or palpitations GI: no abdominal pain, nausea, vomiting, diarrhea, or constipation GU: no dysuria, frequency, or hematuria Skin: no rash Neuro: no headache, numbness, tingling, or weakness of extremities Musculoskeletal: no joint pain or swelling Heme: no bleeding, DVT, or easy bruising Endo: no polydipsia or polyuria    Physical Exam: Blood pressure (!) 123/58, pulse 68, temperature 98 F (36.7 C), temperature source Oral, resp. rate 18, height 5' 2.5" (1.588 m), weight 105.7 kg (233 lb), SpO2 96 %. Body mass index is 41.94 kg/m.   General appearance: alert, cooperative and no distress Neck: no adenopathy, no carotid bruit, no JVD, supple, symmetrical, trachea midline and thyroid not enlarged, symmetric, no tenderness/mass/nodules Lungs: wheezes bilaterally  Extensive  Chest wall: anterior chest wall tenderness Heart: regular rate and rhythm, S1, S2 normal, no murmur, click, rub or gallop Extremities: extremities normal, atraumatic, no cyanosis or edema Pulses: 1+ bilateral pedal pulses, absent PT, feeble popliteal, difficult to exam femoral pulses due to sitting position Skin: Skin color, texture, turgor normal. No rashes or lesions Neurologic: Grossly normal  Labs:   Lab Results  Component Value Date   WBC 14.1 (H) 02/02/2016   HGB 11.2 (L) 02/02/2016   HCT 36.0 02/02/2016   MCV 94.2 02/02/2016   PLT 243 02/02/2016    Recent Labs Lab 02/02/16 0429 02/02/16 1750  NA 136  --   K 4.3  --   CL 103  --   CO2 22  --   BUN 18  --   CREATININE 1.27*  --   CALCIUM 9.3  --   GLUCOSE 425* 462*    Recent Labs  02/01/16 1647  BNP 241.7*    Recent Labs  02/02/16 0429 02/02/16 0849 02/02/16 1413  TROPONINI <0.03 <0.03 <0.03    EKG: Admission EKG revealing normal sinus rhythm/sinus tachycardia at rate of 106 bpm.  EKG 02/01/2016: Also reveals what appears to be atrial flutter with 2 to one conduction with left bundle  branch block.  Telemetry: Review of strips reveal brief episodes  of breakthrough sinus rhythm.  There is one episode of 5 beat wide complex rhythm very suggestive of NSVT, however taking similar to when patient was in a flutter with 2 to one conduction.  No change in axis.   Outpatient Echo 10/11/13: 1. Left ventricle cavity is normal in size. Mild Concentric hypertrophy of the left ventricle. Normal global wall motion. Normal systolic function. Visual EF is 55-60%. Doppler evidence of grade I (impaired) diastolic dysfunction. 2. Left atrial cavity is borderline dilated. 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 3. Normal mitral valve with mild regurgitation. 4. Normal tricuspid valve with mild regurgitation. Mildly elevated PA syst. pressure of 34 mm of Hg. 5. IVC is mildly dilated with respiratory variation. 6. No diagnostic change c.f. EKG of 05/19/2012  Radiology: Ct Angio Chest Pe W And/or Wo Contrast  Result Date: 02/01/2016 CLINICAL DATA:  Chest pain right-sided and midline radiating to back 2 days. Shortness of breath. EXAM: CT ANGIOGRAPHY CHEST WITH CONTRAST TECHNIQUE: Multidetector CT imaging of the chest was performed using the standard protocol during bolus administration of intravenous contrast. Multiplanar CT image reconstructions and MIPs were obtained to evaluate the vascular anatomy. CONTRAST:  Two separate boluses for total of 130 mL Isovue 370. COMPARISON:  None. FINDINGS: Cardiovascular: Mild cardiomegaly. Mild calcified plaque over the thoracic aorta. Pulmonary arterial system is within normal without emboli. Mediastinum/Nodes: No significant mediastinal or hilar adenopathy. Remaining mediastinal structures are within normal. Lungs/Pleura: Lungs are adequately inflated demonstrate a patchy bilateral predominantly peripheral airspace process. No evidence of effusion. More discrete 5 mm nodule over the posterior right lower lobe. Airways are within  normal. Upper Abdomen: Previous cholecystectomy. Musculoskeletal: Within normal. Review of the MIP images confirms the above findings. IMPRESSION: Patchy bilateral predominantly peripheral airspace process. Findings may be due to multifocal pneumonia. Also consider chronic eosinophilic pneumonia. Recommend followup chest CT 4 weeks. No evidence of pulmonary embolism. 5 mm nodule right lower lobe. Recommend followup CT 1 year. This recommendation follows the consensus statement: Guidelines for Management of Small Pulmonary Nodules Detected on CT Scans: A Statement from the [place] as published in Radiology 2005; 237:395-400. Online at: https://www.arnold.com/. Mild cardiomegaly.  Aortic atherosclerosis. Electronically Signed   By: Marin Olp M.D.   On: 02/01/2016 19:58   Dg Chest Portable 1 View  Result Date: 02/01/2016 CLINICAL DATA:  Shortness of breath with chest pain, sore throat and cough 3 days. EXAM: PORTABLE CHEST 1 VIEW COMPARISON:  03/26/2015 FINDINGS: Lungs are adequately inflated without focal consolidation or effusion. There is mild prominence of the perihilar markings. Mild stable cardiomegaly. Calcified plaque over the aortic arch. Remainder of the exam is unchanged. IMPRESSION: Mild cardiomegaly with suggestion minimal vascular congestion. Aortic atherosclerosis. Electronically Signed   By: Marin Olp M.D.   On: 02/01/2016 18:13    Scheduled Meds: . aspirin EC  81 mg Oral Daily  . azithromycin  500 mg Intravenous Q24H  . cefTRIAXone (ROCEPHIN)  IV  1 g Intravenous Q24H  . clopidogrel  75 mg Oral Daily  . diltiazem  60 mg Oral Q6H  . enoxaparin (LOVENOX) injection  40 mg Subcutaneous Daily  . ezetimibe  10 mg Oral Q supper  . famotidine  40 mg Oral BID  . ferrous sulfate  325 mg Oral Q breakfast  . [START ON 02/03/2016] insulin glargine  20 Units Subcutaneous Daily  . [START ON 02/03/2016] insulin regular  0-10 Units Intravenous TID WC  .  methocarbamol  1,000  mg Oral TID AC & HS  . methylPREDNISolone (SOLU-MEDROL) injection  60 mg Intravenous Q6H  . mometasone-formoterol  2 puff Inhalation BID  . morphine      . pregabalin  200 mg Oral TID  . rosuvastatin  10 mg Oral Daily  . sodium chloride flush  3 mL Intravenous Q12H   Continuous Infusions: . sodium chloride 75 mL/hr at 02/02/16 1825  . albuterol Stopped (02/02/16 0117)  . albuterol 10 mg/hr (02/02/16 1244)  . dextrose 5 % and 0.45% NaCl    . insulin (NOVOLIN-R) infusion 4.1 mL/hr at 02/02/16 1900   PRN Meds:.sodium chloride, dextrose, dicyclomine, hydrALAZINE, HYDROcodone-acetaminophen, Influenza vac split quadrivalent PF, ipratropium, levalbuterol, morphine injection, pneumococcal 23 valent vaccine, polyethylene glycol, promethazine, sodium chloride flush, traMADol  ASSESSMENT AND PLAN:  1.  New onset left bundle branch block, resolved  This morning patient is in normal sinus rhythm.  Cardiac markers negative for myocardial injury. 2.  Wide complex tachycardia, Appear to suggest atrial flutter with 2:1 conduction. One episode of NSVT, see below. 3. Atypical chest pain, likely musculoskeletal 4. Shortness of breath, cough 5. CAP 6. COPD exacerbation, History of prior tobacco use disorder,no completely quit 6. Essential HTN 7. IDDM,, Uncontrolled with hyperglycemia 8. HLD 9. PAD Peripheral arteriogram 09/10/2015: Right Femoral Artery with Distal Runoff Revealed about a 50-60% Stenosis in the mid SFA. Below the right knee there is three-vessel runoff. Slow flow was evident in the below the knee vessel. Left femoral artery with distal runoff: The left SFA stent in the mid to distal segment is widely patent. In the proximal to mid segment the left SFA shows a 30-40% stenosis. Below the knee there is three-vessel runoff.  Recommendation: I have started the patient on Eliquis until atrial flutter is ruled out.  Continue diltiazem.  Otherwise no changes in medications for  now, I'll continue to monitor her.  Echocardiogram is pending.  Patient remains abstinent from tobacco.   I'll continue Eliquis for now until I am definite that this is not atrial flutter and chest appeared to be sinus tachycardia.  Extensive review of the telemetry rhythm strips reveals one episode of 5 beat run of wide complex tachycardia most consistent with NSVT.  This occurred on 02/02/2016 at 20:2017:11 hours.  Adrian Prows, MD 02/03/2016, 9:41 AM [facility]. [place] Pager: [phone] Office: [phone] If no answer Cell [phone]

## 2016-02-02 NOTE — ED Notes (Signed)
Carb modified diet ordered 

## 2016-02-02 NOTE — Consult Note (Signed)
Entered in error.  Ena Dawley 02/02/2016

## 2016-02-03 ENCOUNTER — Inpatient Hospital Stay (HOSPITAL_COMMUNITY): Payer: Medicare Other

## 2016-02-03 DIAGNOSIS — R9431 Abnormal electrocardiogram [ECG] [EKG]: Secondary | ICD-10-CM

## 2016-02-03 DIAGNOSIS — R000 Tachycardia, unspecified: Secondary | ICD-10-CM

## 2016-02-03 LAB — CBC WITH DIFFERENTIAL/PLATELET
Basophils Absolute: 0 10*3/uL (ref 0.0–0.1)
Basophils Relative: 0 %
EOS PCT: 0 %
Eosinophils Absolute: 0 10*3/uL (ref 0.0–0.7)
HEMATOCRIT: 34.5 % — AB (ref 36.0–46.0)
Hemoglobin: 10.7 g/dL — ABNORMAL LOW (ref 12.0–15.0)
LYMPHS ABS: 1.3 10*3/uL (ref 0.7–4.0)
LYMPHS PCT: 7 %
MCH: 29.1 pg (ref 26.0–34.0)
MCHC: 31 g/dL (ref 30.0–36.0)
MCV: 93.8 fL (ref 78.0–100.0)
MONO ABS: 0.6 10*3/uL (ref 0.1–1.0)
MONOS PCT: 4 %
NEUTROS ABS: 15.9 10*3/uL — AB (ref 1.7–7.7)
Neutrophils Relative %: 89 %
PLATELETS: 258 10*3/uL (ref 150–400)
RBC: 3.68 MIL/uL — ABNORMAL LOW (ref 3.87–5.11)
RDW: 16 % — AB (ref 11.5–15.5)
WBC: 17.8 10*3/uL — ABNORMAL HIGH (ref 4.0–10.5)

## 2016-02-03 LAB — GLUCOSE, CAPILLARY
GLUCOSE-CAPILLARY: 204 mg/dL — AB (ref 65–99)
GLUCOSE-CAPILLARY: 204 mg/dL — AB (ref 65–99)
GLUCOSE-CAPILLARY: 220 mg/dL — AB (ref 65–99)
GLUCOSE-CAPILLARY: 270 mg/dL — AB (ref 65–99)
GLUCOSE-CAPILLARY: 273 mg/dL — AB (ref 65–99)
Glucose-Capillary: 146 mg/dL — ABNORMAL HIGH (ref 65–99)
Glucose-Capillary: 90 mg/dL (ref 65–99)
Glucose-Capillary: 152 mg/dL — ABNORMAL HIGH (ref 65–99)
Glucose-Capillary: 162 mg/dL — ABNORMAL HIGH (ref 65–99)
Glucose-Capillary: 322 mg/dL — ABNORMAL HIGH (ref 65–99)

## 2016-02-03 LAB — BASIC METABOLIC PANEL
ANION GAP: 7 (ref 5–15)
BUN: 18 mg/dL (ref 6–20)
CHLORIDE: 109 mmol/L (ref 101–111)
CO2: 23 mmol/L (ref 22–32)
Calcium: 9.4 mg/dL (ref 8.9–10.3)
Creatinine, Ser: 1.19 mg/dL — ABNORMAL HIGH (ref 0.44–1.00)
GFR, EST AFRICAN AMERICAN: 55 mL/min — AB (ref >60)
GFR, EST NON AFRICAN AMERICAN: 48 mL/min — AB (ref >60)
Glucose, Bld: 255 mg/dL — ABNORMAL HIGH (ref 65–99)
POTASSIUM: 4.1 mmol/L (ref 3.5–5.1)
SODIUM: 139 mmol/L (ref 135–145)

## 2016-02-03 LAB — ECHOCARDIOGRAM COMPLETE
Height: 62.5 in
Weight: 3728 oz

## 2016-02-03 LAB — STREP PNEUMONIAE URINARY ANTIGEN: STREP PNEUMO URINARY ANTIGEN: NEGATIVE

## 2016-02-03 MED ORDER — BENZONATATE 100 MG PO CAPS
100.0000 mg | ORAL_CAPSULE | Freq: Two times a day (BID) | ORAL | Status: DC
  Administered 2016-02-03 – 2016-02-09 (×14): 100 mg via ORAL
  Filled 2016-02-03 (×14): qty 1

## 2016-02-03 MED ORDER — INSULIN GLARGINE 100 UNIT/ML ~~LOC~~ SOLN
22.0000 [IU] | Freq: Every day | SUBCUTANEOUS | Status: DC
  Administered 2016-02-03: 22 [IU] via SUBCUTANEOUS
  Filled 2016-02-03: qty 0.22

## 2016-02-03 MED ORDER — APIXABAN 5 MG PO TABS
5.0000 mg | ORAL_TABLET | Freq: Two times a day (BID) | ORAL | Status: DC
  Administered 2016-02-03 – 2016-02-04 (×3): 5 mg via ORAL
  Filled 2016-02-03 (×3): qty 1

## 2016-02-03 MED ORDER — INSULIN ASPART 100 UNIT/ML ~~LOC~~ SOLN
0.0000 [IU] | Freq: Every day | SUBCUTANEOUS | Status: DC
  Administered 2016-02-03: 4 [IU] via SUBCUTANEOUS
  Administered 2016-02-04 – 2016-02-06 (×3): 3 [IU] via SUBCUTANEOUS

## 2016-02-03 MED ORDER — DILTIAZEM HCL ER 60 MG PO CP12
60.0000 mg | ORAL_CAPSULE | Freq: Two times a day (BID) | ORAL | Status: DC
  Administered 2016-02-03 – 2016-02-05 (×5): 60 mg via ORAL
  Filled 2016-02-03 (×6): qty 1

## 2016-02-03 MED ORDER — INSULIN GLARGINE 100 UNIT/ML ~~LOC~~ SOLN
25.0000 [IU] | Freq: Two times a day (BID) | SUBCUTANEOUS | Status: DC
  Administered 2016-02-03 – 2016-02-04 (×3): 25 [IU] via SUBCUTANEOUS
  Filled 2016-02-03 (×6): qty 0.25

## 2016-02-03 MED ORDER — BISACODYL 10 MG RE SUPP
10.0000 mg | Freq: Every day | RECTAL | Status: DC | PRN
  Administered 2016-02-03 – 2016-02-07 (×3): 10 mg via RECTAL
  Filled 2016-02-03 (×3): qty 1

## 2016-02-03 MED ORDER — HYDROCOD POLST-CPM POLST ER 10-8 MG/5ML PO SUER
5.0000 mL | Freq: Two times a day (BID) | ORAL | Status: DC | PRN
  Administered 2016-02-04 – 2016-02-09 (×5): 5 mL via ORAL
  Filled 2016-02-03 (×5): qty 5

## 2016-02-03 MED ORDER — ENOXAPARIN SODIUM 120 MG/0.8ML ~~LOC~~ SOLN: 1.0000 mg/kg | Freq: Every day | SUBCUTANEOUS | Status: DC

## 2016-02-03 MED ORDER — INSULIN ASPART 100 UNIT/ML ~~LOC~~ SOLN
0.0000 [IU] | Freq: Three times a day (TID) | SUBCUTANEOUS | Status: DC
  Administered 2016-02-03: 11 [IU] via SUBCUTANEOUS
  Administered 2016-02-03: 7 [IU] via SUBCUTANEOUS
  Administered 2016-02-03: 4 [IU] via SUBCUTANEOUS
  Administered 2016-02-04 (×2): 15 [IU] via SUBCUTANEOUS
  Administered 2016-02-04 – 2016-02-06 (×5): 11 [IU] via SUBCUTANEOUS
  Administered 2016-02-06: 4 [IU] via SUBCUTANEOUS
  Administered 2016-02-06 – 2016-02-07 (×3): 7 [IU] via SUBCUTANEOUS
  Administered 2016-02-08: 4 [IU] via SUBCUTANEOUS
  Administered 2016-02-08: 15 [IU] via SUBCUTANEOUS
  Administered 2016-02-08: 3 [IU] via SUBCUTANEOUS
  Administered 2016-02-09: 11 [IU] via SUBCUTANEOUS

## 2016-02-03 NOTE — Progress Notes (Signed)
ANTICOAGULATION CONSULT NOTE - Initial Consult  Pharmacy Consult for apixiban Indication: atrial fibrillation  Allergies  Allergen Reactions  . Coconut Oil Hives    Patient Measurements: Height: 5' 2.5" (158.8 cm) Weight: 233 lb (105.7 kg) IBW/kg (Calculated) : 51.25   Vital Signs: Temp: 98 F (36.7 C) (09/18 0647) Temp Source: Oral (09/18 0647) BP: 123/58 (09/18 0647) Pulse Rate: 68 (09/18 0647)  Labs:  Recent Labs  02/01/16 1647 02/02/16 0429 02/02/16 0849 02/02/16 1413 02/03/16 0107 02/03/16 0111  HGB 11.7* 11.2*  --   --  10.7*  --   HCT 37.1 36.0  --   --  34.5*  --   PLT 251 243  --   --  258  --   CREATININE 1.18* 1.27*  --   --   --  1.19*  TROPONINI  --  <0.03 <0.03 <0.03  --   --     Estimated Creatinine Clearance: 55.8 mL/min (by C-G formula based on SCr of 1.19 mg/dL (H)).   Medical History: Past Medical History:  Diagnosis Date  . Anemia    sickle cell trait  . Anxiety   . Arthritis    "knees" (10/12/2013)  . CHF (congestive heart failure) ([place])   . Chronic back pain   . Chronic bronchitis ([place])    "got it q yr when I lived in [place]"  . Complication of anesthesia    hard to put under  . COPD (chronic obstructive pulmonary disease) ([place])   . Dysrhythmia   . Family history of anesthesia complication    sister hard to wake up  . Family history of anesthesia complication    niece have itching  . Fibromyalgia   . GERD (gastroesophageal reflux disease)   . Hyperlipidemia   . Hypertension   . Irregular heart beat   . Migraine    "used to get them alot; not regular anymore" (10/12/2013)  . 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 Pneumonia    "twice"  . Type II diabetes mellitus ([facility])     Medications:  Prescriptions Prior to Admission  Medication Sig Dispense Refill Last Dose  . albuterol (PROAIR HFA) 108 (90 BASE) MCG/ACT inhaler Inhale 2 puffs into the lungs every 6 (six) hours as needed  for wheezing or shortness of breath. ProAir   02/01/2016 at am  . albuterol (PROVENTIL) (2.5 MG/3ML) 0.083% nebulizer solution Take 2.5 mg by nebulization every 6 (six) hours as needed for wheezing or shortness of breath.    01/31/2016 at pm  . aspirin EC 81 MG tablet Take 81 mg by mouth daily.    02/01/2016 at am  . budesonide-formoterol (SYMBICORT) 160-4.5 MCG/ACT inhaler Inhale 2 puffs into the lungs 2 (two) times daily.   02/01/2016 at am  . clopidogrel (PLAVIX) 75 MG tablet Take 75 mg by mouth daily.   1 02/01/2016 at 1200  . dicyclomine (BENTYL) 20 MG tablet Take 20 mg by mouth 4 (four) times daily as needed for spasms.    02/01/2016 at am  . ezetimibe (ZETIA) 10 MG tablet Take 10 mg by mouth daily with supper.   01/31/2016 at pm  . famotidine (PEPCID) 40 MG tablet Take 40 mg by mouth 2 (two) times daily.   02/01/2016 at am  . ferrous sulfate 325 (65 FE) MG tablet Take 325 mg by mouth daily with breakfast.    02/01/2016 at am  . HUMALOG MIX 75/25 KWIKPEN (75-25) 100  UNIT/ML Kwikpen Inject 22 Units into the skin 2 (two) times daily with a meal.   2 02/01/2016 at am  . Lancets (ONETOUCH ULTRASOFT) lancets    Taking  . lisinopril-hydrochlorothiazide (PRINZIDE,ZESTORETIC) 20-12.5 MG per tablet Take 1 tablet by mouth daily.    02/01/2016 at am  . meloxicam (MOBIC) 15 MG tablet TAKE 1 TABLET BY MOUTH DAILY  0 02/01/2016 at am  . Menthol-Methyl Salicylate (MUSCLE RUB) 10-15 % CREA Apply 1 application topically as needed for muscle pain.   Past Week at Unknown time  . methocarbamol (ROBAXIN) 500 MG tablet Take 1,000 mg by mouth 4 (four) times daily.   Past Week at Unknown time  . ONETOUCH VERIO test strip    Taking  . polyethylene glycol powder (GLYCOLAX/MIRALAX) powder Take 17 g by mouth daily as needed for mild constipation.    Past Week at Unknown time  . pregabalin (LYRICA) 200 MG capsule Take 200 mg by mouth 3 (three) times daily.   02/01/2016 at am  . promethazine (PHENERGAN) 25 MG tablet Take 25 mg by  mouth every 6 (six) hours as needed for nausea or vomiting.    01/31/2016 at pm  . rosuvastatin (CRESTOR) 10 MG tablet Take 10 mg by mouth daily.    01/31/2016 at pm  . tiZANidine (ZANAFLEX) 4 MG tablet Take 2 mg by mouth three times a day  1 01/31/2016 at pm  . traMADol (ULTRAM) 50 MG tablet Take 100 mg by mouth 2 (two) times daily as needed for moderate pain.   1 01/31/2016 at pm  . Vitamin D, Ergocalciferol, (DRISDOL) 50000 UNITS CAPS capsule Take 50,000 Units by mouth every Monday.    01/27/2016 at am   Scheduled:  . apixaban  5 mg Oral BID  . aspirin EC  81 mg Oral Daily  . azithromycin  500 mg Intravenous Q24H  . cefTRIAXone (ROCEPHIN)  IV  1 g Intravenous Q24H  . clopidogrel  75 mg Oral Daily  . diltiazem  60 mg Oral Q12H  . ezetimibe  10 mg Oral Q supper  . famotidine  40 mg Oral BID  . ferrous sulfate  325 mg Oral Q breakfast  . insulin aspart  0-20 Units Subcutaneous TID WC  . insulin aspart  0-5 Units Subcutaneous QHS  . insulin glargine  25 Units Subcutaneous BID  . methocarbamol  1,000 mg Oral TID AC & HS  . methylPREDNISolone (SOLU-MEDROL) injection  60 mg Intravenous Q6H  . mometasone-formoterol  2 puff Inhalation BID  . pregabalin  200 mg Oral TID  . rosuvastatin  10 mg Oral Daily    Assessment: 65 yo female with afib (CHADSVASC=5 ) and pharmacy consulted to dose apixiban. She is note on aspirin and plavix (PAD s/p bypass) -Wt= 105.7kg, SCr= 1.19 -hg= 10.7, plt= 258  Goal of Therapy:  Monitor platelets by anticoagulation protocol: Yes   Plan: -Apixiban 5mg  po bid -Consider d/c aspirin or may be able to discontinue plavix (trials with NOACs and effectiveness/saftey in PAD are still in progress) -Will provide patient education  Hildred Laser, Pharm D 02/03/2016 8:57 AM

## 2016-02-03 NOTE — Progress Notes (Signed)
  Echocardiogram 2D Echocardiogram has been performed.  Christina Solis, [name] 02/03/2016, 12:09 PM

## 2016-02-03 NOTE — Care Management Note (Signed)
Case Management Note  Patient Details  Name: Christina Solis MRN: [id number] Date of Birth: 17-Feb-1951  Subjective/Objective:   Patient with CAP, lives with daughter , Theadora Rama, she has a cane, a shower chair with back and arms,  She has a pcp, Eldridge Abrahams at [facility] , she has no problems getting medications , she has transportation at [employer].  NCM will cont to follow for dc needs.                  Action/Plan:   Expected Discharge Date:                  Expected Discharge Plan:  Home/Self Care  In-House Referral:     Discharge planning Services  CM Consult  Post Acute Care Choice:    Choice offered to:     DME Arranged:    DME Agency:     HH Arranged:    HH Agency:     Status of Service:  Completed, signed off  If discussed at [employer] of Stay Meetings, dates discussed:    Additional Comments:  Zenon Mayo, RN 02/03/2016, 5:38 PM

## 2016-02-03 NOTE — Evaluation (Signed)
Occupational Therapy Evaluation Patient Details Name: Christina Solis MRN: [id number] DOB: 05-07-1951 Today's Date: 02/03/2016    History of Present Illness 65 yo female admitted with SOB with oxygen desaturation and tachycardia. Pt dx with new LBBB and PNA. Pending cardiology workup. PMH:  Past Medical History:  Diagnosis Date  . Anemia    sickle cell trait  . Anxiety   . Arthritis    "knees" (10/12/2013)  . CHF (congestive heart failure) ([place])   . Chronic back pain   . Chronic bronchitis ([place])    "got it q yr when I lived in [place]"  . Complication of anesthesia    hard to put under  . COPD (chronic obstructive pulmonary disease) ([place])   . Dysrhythmia   . Family history of anesthesia complication    sister hard to wake up  . Family history of anesthesia complication    niece have itching  . Fibromyalgia   . GERD (gastroesophageal reflux disease)   . Hyperlipidemia   . Hypertension   . Irregular heart beat   . Migraine    "used to get them alot; not regular anymore" (10/12/2013)  . 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 Pneumonia    "twice"  . Type II diabetes mellitus ([place])       Clinical Impression   Patient evaluated by Occupational Therapy with no further acute OT needs identified. All education has been completed and the patient has no further questions. See below for any follow-up Occupational Therapy or equipment needs. OT to sign off. Thank you for referral.   Pt with HR 95 during session and oxygen on room air remained 98% to 100% throughout session. Pt provided energy conservation handout.     Follow Up Recommendations  No OT follow up    Equipment Recommendations  None recommended by OT    Recommendations for Other Services       Precautions / Restrictions Precautions Precautions: None Precaution Comments: watch HR and O2 levels       Mobility Bed Mobility Overal bed mobility: Independent                 Transfers Overall transfer level: Modified independent                    Balance Overall balance assessment: No apparent balance deficits (not formally assessed)                                          ADL Overall ADL's : Modified independent                                       General ADL Comments: Educated on energy conservation and preventing bending. Pt demonstrates ability to pick items up off the floor without balance deficits      Vision Vision Assessment?: No apparent visual deficits   Perception     Praxis      Pertinent Vitals/Pain Pain Assessment: 0-10 Pain Score: 7  Pain Location: head Pain Descriptors / Indicators: Headache     Hand Dominance Left   Extremity/Trunk Assessment Upper Extremity Assessment Upper Extremity Assessment: Generalized weakness   Lower Extremity Assessment Lower Extremity Assessment: Defer to PT evaluation   Cervical /  Trunk Assessment Cervical / Trunk Assessment: Normal   Communication Communication Communication: No difficulties   Cognition Arousal/Alertness: Awake/alert Behavior During Therapy: WFL for tasks assessed/performed Overall Cognitive Status: Within Functional Limits for tasks assessed                     General Comments       Exercises       Shoulder Instructions      Home Living Family/patient expects to be discharged to:: Private residence Living Arrangements: Children (daughter) Available Help at Discharge: Family;Available PRN/intermittently (daughter works from home doing hair) Type of Home: House Home Access: Stairs to enter Technical brewer of Steps: 3 Entrance Stairs-Rails: None Home Layout: One level     Bathroom Shower/Tub: Teacher, early years/pre: [place] - single point;Shower seat;Hand held shower head   Additional Comments: have not used the shower seat or cane in  about a month      Prior Functioning/Environment Level of Independence: Independent with assistive device(s)                 OT Problem List:     OT Treatment/Interventions:      OT Goals(Current goals can be found in the care plan section)    OT Frequency:     Barriers to D/C:            Co-evaluation              End of Session Nurse Communication: Mobility status;Precautions  Activity Tolerance: Patient tolerated treatment well Patient left: in chair;with call bell/phone within reach   Time: 0721-0747 OT Time Calculation (min): 26 min Charges:  OT General Charges $OT Visit: 1 Procedure OT Evaluation $OT Eval Moderate Complexity: 1 Procedure OT Treatments $Self Care/Home Management : 8-22 mins G-Codes:    [name] 02/15/2016, 7:58 AM   [name]   OTR/L [phone] Office: [phone] .

## 2016-02-03 NOTE — Progress Notes (Signed)
Pt's last 3 Blood sugars were within limits Dr Hal Hope on the floor got transition orders 22 lantus and resistant scale will continue to monitor

## 2016-02-03 NOTE — Progress Notes (Signed)
PT Cancellation Note  Patient Details Name: JADEY RODEN MRN: [id number] DOB: February 11, 1951   Cancelled Treatment:    Reason Eval/Treat Not Completed: Fatigue/lethargy limiting ability to participate; patient just back to bed and with audible wheezing (reports just after treatment).  Will attempt again later today.   Reginia Naas 02/03/2016, 10:10 AM  Magda Kiel, PT [phone] 02/03/2016

## 2016-02-03 NOTE — Progress Notes (Signed)
PROGRESS NOTE    Christina Solis  [id number] DOB: April 21, 1951 DOA: 02/01/2016 PCP: Charlett Blake, MD  Brief Narrative:   Assessment & Plan:  1. CAP with COPD exacerbation -improving -continue IV Rocephin/azithromycin -IV solumedrol -nebs scheduled and PRN -FU cultures   2. ? Aflutter Now in NSR, rate controlled -started Cardizem and Eliquis per Dr.Ganji -she is also on ASA and plavix for PAD, will stop ASA for now -FU ECHO  3. Wide complex tachycardia and LBBB -one episode of NSVT -resolved  4. CKD 3 -stable  5. HTN -stable  6. Lung nodule -needs FU  DVT prophylaxis:Lovenox Code Status:Full Code Family Communication:None at bedside Disposition Plan:Home in few days   Consultants:  Cards Dr.Ganji   Subjective: Breathing better  Objective: Vitals:   02/03/16 0700 02/03/16 0741 02/03/16 1237 02/03/16 1437  BP: (!) 123/58  (!) 169/77   Pulse: 62  83   Resp: 15  19   Temp: 98 F (36.7 C)  97.9 F (36.6 C) 98 F (36.7 C)  TempSrc: Oral  Oral Oral  SpO2: 97% 96% 97%   Weight:      Height:        Intake/Output Summary (Last 24 hours) at 02/03/16 1551 Last data filed at 02/03/16 1000  Gross per 24 hour  Intake          2011.43 ml  Output             1175 ml  Net           836.43 ml   Filed Weights   02/01/16 1649 02/02/16 1555  Weight: 103.9 kg (229 lb) 105.7 kg (233 lb)    Examination:  General exam: Appears calm and comfortable, AAOx3 Respiratory system: Clear to auscultation, improved air movement, no wheezes Cardiovascular system: S1 & S2 heard, RRR. No JVD, murmurs, rubs, gallops or clicks. No pedal edema. Gastrointestinal system: Abdomen is nondistended, soft and nontender. No organomegaly or masses felt. Normal bowel sounds heard. Central nervous system: Alert and oriented. No focal neurological deficits. Extremities: Symmetric 5 x 5 power. Skin: No rashes, lesions or ulcers Psychiatry: Judgement and insight appear normal. Mood &  affect appropriate.     Data Reviewed: I have personally reviewed following labs and imaging studies  CBC:  Recent Labs Lab 02/01/16 1647 02/02/16 0429 02/03/16 0107  WBC 15.0* 14.1* 17.8*  NEUTROABS  --  13.0* 15.9*  HGB 11.7* 11.2* 10.7*  HCT 37.1 36.0 34.5*  MCV 93.9 94.2 93.8  PLT 251 243 0000000   Basic Metabolic Panel:  Recent Labs Lab 02/01/16 1647 02/02/16 0429 02/02/16 1750 02/03/16 0111  NA 137 136  --  139  K 4.1 4.3  --  4.1  CL 107 103  --  109  CO2 22 22  --  23  GLUCOSE 157* 425* 462* 255*  BUN 21* 18  --  18  CREATININE 1.18* 1.27*  --  1.19*  CALCIUM 9.3 9.3  --  9.4   GFR: Estimated Creatinine Clearance: 55.8 mL/min (by C-G formula based on SCr of 1.19 mg/dL (H)). Liver Function Tests: No results for input(s): AST, ALT, ALKPHOS, BILITOT, PROT, ALBUMIN in the last 168 hours. No results for input(s): LIPASE, AMYLASE in the last 168 hours. No results for input(s): AMMONIA in the last 168 hours. Coagulation Profile: No results for input(s): INR, PROTIME in the last 168 hours. Cardiac Enzymes:  Recent Labs Lab 02/02/16 0429 02/02/16 0849 02/02/16 1413  TROPONINI <0.03 <0.03 <0.03  BNP (last 3 results) No results for input(s): PROBNP in the last 8760 hours. HbA1C: No results for input(s): HGBA1C in the last 72 hours. CBG:  Recent Labs Lab 02/03/16 0357 02/03/16 0504 02/03/16 0601 02/03/16 0713 02/03/16 1249  GLUCAP 90 152* 204* 162* 273*   Lipid Profile: No results for input(s): CHOL, HDL, LDLCALC, TRIG, CHOLHDL, LDLDIRECT in the last 72 hours. Thyroid Function Tests: No results for input(s): TSH, T4TOTAL, FREET4, T3FREE, THYROIDAB in the last 72 hours. Anemia Panel: No results for input(s): VITAMINB12, FOLATE, FERRITIN, TIBC, IRON, RETICCTPCT in the last 72 hours. Urine analysis:    Component Value Date/Time   COLORURINE YELLOW 09/04/2013 0052   APPEARANCEUR CLEAR 09/04/2013 0052   LABSPEC 1.017 09/04/2013 0052   PHURINE 5.0  09/04/2013 0052   GLUCOSEU NEGATIVE 09/04/2013 0052   HGBUR TRACE (A) 09/04/2013 0052   BILIRUBINUR NEGATIVE 09/04/2013 0052   KETONESUR NEGATIVE 09/04/2013 0052   PROTEINUR NEGATIVE 09/04/2013 0052   UROBILINOGEN 1.0 09/04/2013 0052   NITRITE NEGATIVE 09/04/2013 0052   LEUKOCYTESUR NEGATIVE 09/04/2013 0052   Sepsis Labs: @LABRCNTIP (procalcitonin:4,lacticidven:4)  ) Recent Results (from the past 240 hour(s))  Blood culture (routine x 2)     Status: None (Preliminary result)   Collection Time: 02/01/16 10:27 PM  Result Value Ref Range Status   Specimen Description BLOOD LEFT ARM  Final   Special Requests BOTTLES DRAWN AEROBIC AND ANAEROBIC 5CC  Final   Culture NO GROWTH < 12 HOURS  Final   Report Status PENDING  Incomplete  Blood culture (routine x 2)     Status: None (Preliminary result)   Collection Time: 02/01/16 10:35 PM  Result Value Ref Range Status   Specimen Description BLOOD RIGHT ARM  Final   Special Requests BOTTLES DRAWN AEROBIC AND ANAEROBIC 5CC EACH  Final   Culture NO GROWTH < 12 HOURS  Final   Report Status PENDING  Incomplete  MRSA PCR Screening     Status: None   Collection Time: 02/02/16  3:55 PM  Result Value Ref Range Status   MRSA by PCR NEGATIVE NEGATIVE Final    Comment:        The GeneXpert MRSA Assay (FDA approved for NASAL specimens only), is one component of a comprehensive MRSA colonization surveillance program. It is not intended to diagnose MRSA infection nor to guide or monitor treatment for MRSA infections.          Radiology Studies: Ct Angio Chest Pe W And/or Wo Contrast  Result Date: 02/01/2016 CLINICAL DATA:  Chest pain right-sided and midline radiating to back 2 days. Shortness of breath. EXAM: CT ANGIOGRAPHY CHEST WITH CONTRAST TECHNIQUE: Multidetector CT imaging of the chest was performed using the standard protocol during bolus administration of intravenous contrast. Multiplanar CT image reconstructions and MIPs were  obtained to evaluate the vascular anatomy. CONTRAST:  Two separate boluses for total of 130 mL Isovue 370. COMPARISON:  None. FINDINGS: Cardiovascular: Mild cardiomegaly. Mild calcified plaque over the thoracic aorta. Pulmonary arterial system is within normal without emboli. Mediastinum/Nodes: No significant mediastinal or hilar adenopathy. Remaining mediastinal structures are within normal. Lungs/Pleura: Lungs are adequately inflated demonstrate a patchy bilateral predominantly peripheral airspace process. No evidence of effusion. More discrete 5 mm nodule over the posterior right lower lobe. Airways are within normal. Upper Abdomen: Previous cholecystectomy. Musculoskeletal: Within normal. Review of the MIP images confirms the above findings. IMPRESSION: Patchy bilateral predominantly peripheral airspace process. Findings may be due to multifocal pneumonia. Also consider chronic eosinophilic pneumonia. Recommend  followup chest CT 4 weeks. No evidence of pulmonary embolism. 5 mm nodule right lower lobe. Recommend followup CT 1 year. This recommendation follows the consensus statement: Guidelines for Management of Small Pulmonary Nodules Detected on CT Scans: A Statement from the [employer] as published in Radiology 2005; 237:395-400. Online at: https://www.arnold.com/. Mild cardiomegaly.  Aortic atherosclerosis. Electronically Signed   By: Marin Olp M.D.   On: 02/01/2016 19:58   Dg Chest Portable 1 View  Result Date: 02/01/2016 CLINICAL DATA:  Shortness of breath with chest pain, sore throat and cough 3 days. EXAM: PORTABLE CHEST 1 VIEW COMPARISON:  03/26/2015 FINDINGS: Lungs are adequately inflated without focal consolidation or effusion. There is mild prominence of the perihilar markings. Mild stable cardiomegaly. Calcified plaque over the aortic arch. Remainder of the exam is unchanged. IMPRESSION: Mild cardiomegaly with suggestion minimal vascular congestion.  Aortic atherosclerosis. Electronically Signed   By: Marin Olp M.D.   On: 02/01/2016 18:13        Scheduled Meds: . apixaban  5 mg Oral BID  . aspirin EC  81 mg Oral Daily  . azithromycin  500 mg Intravenous Q24H  . benzonatate  100 mg Oral BID  . cefTRIAXone (ROCEPHIN)  IV  1 g Intravenous Q24H  . clopidogrel  75 mg Oral Daily  . diltiazem  60 mg Oral Q12H  . ezetimibe  10 mg Oral Q supper  . famotidine  40 mg Oral BID  . ferrous sulfate  325 mg Oral Q breakfast  . insulin aspart  0-20 Units Subcutaneous TID WC  . insulin aspart  0-5 Units Subcutaneous QHS  . insulin glargine  25 Units Subcutaneous BID  . methocarbamol  1,000 mg Oral TID AC & HS  . methylPREDNISolone (SOLU-MEDROL) injection  60 mg Intravenous Q6H  . mometasone-formoterol  2 puff Inhalation BID  . pregabalin  200 mg Oral TID  . rosuvastatin  10 mg Oral Daily   Continuous Infusions: . albuterol Stopped (02/02/16 0117)  . albuterol 10 mg/hr (02/02/16 1244)     LOS: 2 days    Time spent: 34min    Domenic Polite, MD [facility] Pager [phone]  If 7PM-7AM, please contact night-coverage [email or web address] Password [facility] 02/03/2016, 3:51 PM

## 2016-02-03 NOTE — Progress Notes (Signed)
Nutrition Brief Note  Received consult for assessment of nutrition status from COPD Gold Protocol.   Wt Readings from Last 15 Encounters:  02/02/16 233 lb (105.7 kg)  09/10/15 229 lb (103.9 kg)  10/27/14 210 lb (95.3 kg)  11/03/13 219 lb (99.3 kg)  10/12/13 224 lb (101.6 kg)  10/04/13 224 lb 1.6 oz (101.7 kg)  09/15/13 230 lb 3.2 oz (104.4 kg)  11/07/12 236 lb 3.2 oz (107.1 kg)  07/04/12 241 lb (109.3 kg)  06/28/12 241 lb (109.3 kg)  06/20/12 241 lb (109.3 kg)  06/25/11 233 lb (105.7 kg)    Body mass index is 41.94 kg/m. Patient meets criteria for class 3, extreme/morbid obesity based on current BMI. Patient with no weight loss PTA.   Current diet order is Heart Healthy. Labs and medications reviewed.   No nutrition interventions warranted at this time. If nutrition issues arise, please consult RD.   Molli Barrows, RD, LDN, [place] Pager [phone] After Hours Pager [phone]

## 2016-02-03 NOTE — Progress Notes (Signed)
PT Cancellation Note  Patient Details Name: Christina Solis MRN: [id number] DOB: 1951-01-06   Cancelled Treatment:    Reason Eval/Treat Not Completed: PT screened, no needs identified, will sign off noted pt independent with OT, patient in bathroom washing up on her own.  Agrees to not having any PT needs at this time.  Just knows she has to take her time and take breaks, etc.  Will defer PT eval as she is functioning close to her baseline with no PT needs identified.    Reginia Naas 02/03/2016, 12:18 PM  Magda Kiel, [place] 02/03/2016

## 2016-02-04 ENCOUNTER — Inpatient Hospital Stay (HOSPITAL_COMMUNITY): Payer: Medicare Other

## 2016-02-04 DIAGNOSIS — M79609 Pain in unspecified limb: Secondary | ICD-10-CM

## 2016-02-04 LAB — GLUCOSE, CAPILLARY
GLUCOSE-CAPILLARY: 303 mg/dL — AB (ref 65–99)
GLUCOSE-CAPILLARY: 306 mg/dL — AB (ref 65–99)
Glucose-Capillary: 342 mg/dL — ABNORMAL HIGH (ref 65–99)
Glucose-Capillary: 278 mg/dL — ABNORMAL HIGH (ref 65–99)
Glucose-Capillary: 288 mg/dL — ABNORMAL HIGH (ref 65–99)

## 2016-02-04 LAB — LEGIONELLA PNEUMOPHILA SEROGP 1 UR AG: L. PNEUMOPHILA SEROGP 1 UR AG: NEGATIVE

## 2016-02-04 MED ORDER — METHYLPREDNISOLONE SODIUM SUCC 40 MG IJ SOLR
40.0000 mg | Freq: Two times a day (BID) | INTRAMUSCULAR | Status: DC
  Administered 2016-02-04 – 2016-02-05 (×2): 40 mg via INTRAVENOUS
  Filled 2016-02-04 (×2): qty 1

## 2016-02-04 MED ORDER — HYDROCHLOROTHIAZIDE 12.5 MG PO CAPS
12.5000 mg | ORAL_CAPSULE | Freq: Every day | ORAL | Status: DC
  Administered 2016-02-04: 12.5 mg via ORAL
  Filled 2016-02-04: qty 1

## 2016-02-04 MED ORDER — ENOXAPARIN SODIUM 40 MG/0.4ML ~~LOC~~ SOLN
40.0000 mg | SUBCUTANEOUS | Status: DC
  Administered 2016-02-04 – 2016-02-08 (×5): 40 mg via SUBCUTANEOUS
  Filled 2016-02-04 (×5): qty 0.4

## 2016-02-04 MED ORDER — LISINOPRIL 20 MG PO TABS
20.0000 mg | ORAL_TABLET | Freq: Every day | ORAL | Status: DC
  Administered 2016-02-04 – 2016-02-09 (×6): 20 mg via ORAL
  Filled 2016-02-04 (×6): qty 1

## 2016-02-04 MED ORDER — AMLODIPINE BESYLATE 10 MG PO TABS
10.0000 mg | ORAL_TABLET | Freq: Every day | ORAL | Status: DC
  Administered 2016-02-04: 10 mg via ORAL
  Filled 2016-02-04: qty 1

## 2016-02-04 MED ORDER — ASPIRIN 81 MG PO CHEW
81.0000 mg | CHEWABLE_TABLET | Freq: Every day | ORAL | Status: DC
  Administered 2016-02-04 – 2016-02-09 (×6): 81 mg via ORAL
  Filled 2016-02-04 (×6): qty 1

## 2016-02-04 NOTE — Progress Notes (Signed)
   02/04/16 0137  Vitals  BP (!) 183/89  MAP (mmHg) 115  BP Location Left Arm  BP Method Automatic  Patient Position (if appropriate) Lying  Pulse Rate 74  Pulse Rate Source Monitor  ECG Heart Rate 77  10 mg hydralazine given per orders will continue to monitor

## 2016-02-04 NOTE — Progress Notes (Signed)
Patient to transfer to 5W10 report given to receiving nurse Ginger, all questions answered at this time.  Pt. VSS with no s/s of distress noted.  Pt. Stable at transfer.

## 2016-02-04 NOTE — Progress Notes (Signed)
**  Preliminary report by tech**  Bilateral lower extremity venous duplex completed. There is no evidence of deep or superficial vein thrombosis involving the right and left lower extremities. All visualized vessels appear patent and compressible. There is no evidence of Baker's cysts bilaterally.  02/04/16 11:28 AM Carlos Levering RVT

## 2016-02-04 NOTE — Progress Notes (Signed)
Patient transferred from [place]. Telemetry applied, skin intact. Oriented to room.

## 2016-02-04 NOTE — Progress Notes (Signed)
Subjective:  She is feeling much better, continues to have wheezing and dyspnea. No fever or chills. No chest pain.  Objective:  Vital Signs in the last 24 hours: Temp:  [97.9 F (36.6 C)-98.6 F (37 C)] 98.2 F (36.8 C) (09/19 0700) Pulse Rate:  [64-92] 64 (09/19 0700) Resp:  [16-25] 16 (09/19 0700) BP: (152-190)/(69-89) 183/80 (09/19 0700) SpO2:  [95 %-100 %] 100 % (09/19 0748)  Intake/Output from previous day: 09/18 0701 - 09/19 0700 In: 1230 [P.O.:930; IV Piggyback:300] Out: 2075 [Urine:2075]  Physical Exam: General appearance: alert, cooperative and no distress Neck: no adenopathy, no carotid bruit, no JVD, supple, symmetrical, trachea midline and thyroid not enlarged, symmetric, no tenderness/mass/nodules Lungs: wheezes bilaterally  Extensive  Chest wall: anterior chest wall tenderness Heart: regular rate and rhythm, S1, S2 normal, no murmur, click, rub or gallop Extremities: extremities normal, atraumatic, no cyanosis or edema Pulses: 1+ bilateral pedal pulses, absent PT, feeble popliteal, difficult to exam femoral pulses due to sitting position Skin: Skin color, texture, turgor normal. No rashes or lesions Neurologic: Grossly normal Lab Results: BMP  Recent Labs  02/01/16 1647 02/02/16 0429 02/02/16 1750 02/03/16 0111  NA 137 136  --  139  K 4.1 4.3  --  4.1  CL 107 103  --  109  CO2 22 22  --  23  GLUCOSE 157* 425* 462* 255*  BUN 21* 18  --  18  CREATININE 1.18* 1.27*  --  1.19*  CALCIUM 9.3 9.3  --  9.4  GFRNONAA 48* 44*  --  48*  GFRAA 56* 51*  --  55*    Recent Labs Lab 02/03/16 0107  WBC 17.8*  RBC 3.68*  HGB 10.7*  HCT 34.5*  PLT 258  MCV 93.8  MCH 29.1  MCHC 31.0  RDW 16.0*  LYMPHSABS 1.3  MONOABS 0.6  EOSABS 0.0  BASOSABS 0.0    Recent Labs  02/02/16 0429 02/02/16 0849 02/02/16 1413  TROPONINI <0.03 <0.03 <0.03    Cardiac Studies:  EKG: Admission EKG revealing normal sinus rhythm/sinus tachycardia at rate of 106  bpm.  EKG 02/01/2016: Also reveals what appears to be atrial tachycardia with 2 to one conduction with left bundle branch block.  Telemetry: Review of strips reveal brief episodes of breakthrough sinus rhythm.  There is one episode of 5 beat wide complex rhythm very suggestive of NSVT, however taking similar to when patient was in A. Tachycardia with 2:1 conduction.  No change in axis.  Echocardiogram 02/03/2016: Normal LV systolic function, year 0000000, mild focal basal hypertrophy, grade 2 diastolic dysfunction. No significant valvular abnormality. IVC was dilated with decreased respiratory variation consistent with elevated CVP.  Scheduled Meds: . amLODipine  10 mg Oral Daily  . apixaban  5 mg Oral BID  . azithromycin  500 mg Intravenous Q24H  . benzonatate  100 mg Oral BID  . cefTRIAXone (ROCEPHIN)  IV  1 g Intravenous Q24H  . clopidogrel  75 mg Oral Daily  . diltiazem  60 mg Oral Q12H  . ezetimibe  10 mg Oral Q supper  . famotidine  40 mg Oral BID  . ferrous sulfate  325 mg Oral Q breakfast  . insulin aspart  0-20 Units Subcutaneous TID WC  . insulin aspart  0-5 Units Subcutaneous QHS  . insulin glargine  25 Units Subcutaneous BID  . lisinopril  20 mg Oral Daily  . methocarbamol  1,000 mg Oral TID AC & HS  . methylPREDNISolone (SOLU-MEDROL) injection  60 mg Intravenous Q6H  . mometasone-formoterol  2 puff Inhalation BID  . pregabalin  200 mg Oral TID  . rosuvastatin  10 mg Oral Daily   Continuous Infusions: . albuterol Stopped (02/02/16 0117)  . albuterol 10 mg/hr (02/02/16 1244)   PRN Meds:.bisacodyl, chlorpheniramine-HYDROcodone, dextrose, dicyclomine, hydrALAZINE, HYDROcodone-acetaminophen, Influenza vac split quadrivalent PF, ipratropium, levalbuterol, morphine injection, pneumococcal 23 valent vaccine, polyethylene glycol, promethazine, traMADol   Assessment/Plan:  1. Atrial tachycardia, paroxysmal with 2:1 conduction. 5 beat run of wide complex tachycardia suggestive  of NSVT. This is in the setting of severe hypoxemia and acute COPD exacerbation. 2. Peripheral arterial disease with history of left SFA stenting. Moderate disease in the right. 3. Acute COPD exacerbation and community-acquired pneumonia, history of tobacco use disorder quit and does remain abstinent 4. Hyperlipidemia 5. Hypertension  Recommendation: Patient's arrhythmia does not appear to be atrial flutter or atrial fibrillation. The rhythm appears to be atrial tachycardia in the setting of acute COPD exacerbation. Asymptomatic left bundle branch block indicates mild conduction system abnormality especially when she is in 2:1 conduction but also during normal sinus rhythm. Heart rate markers are negative myocardial injury. Hence no further recommendation is indicated with regard to this. Continue diltiazem. Discontinue Eliquis and restart aspirin for secondary prevention and Lovenox for DVT prophylaxis.  Adrian Prows, M.D. 02/04/2016, 9:34 AM [facility] Pager: [phone] Office: [phone] If no answer: [phone]

## 2016-02-04 NOTE — Care Management Important Message (Signed)
Important Message  Patient Details  Name: Christina Solis MRN: [id number] Date of Birth: 03/25/1951   Medicare Important Message Given:  Yes    Nathen May 02/04/2016, 9:47 AM

## 2016-02-04 NOTE — Progress Notes (Signed)
BP 181/83 NP text paged awaiting orders

## 2016-02-04 NOTE — Progress Notes (Signed)
PROGRESS NOTE    Christina Solis  [id number] DOB: Jan 18, 1951 DOA: 02/01/2016 PCP: Charlett Blake, MD  Brief Narrative: Christina Solis is a 65 y.o. female with medical history significant for peripheral artery disease, insulin dependent diabetes mellitus, hypertension, GERD, fibromyalgia, and COPD who presents to the emergency department with chest discomfort, dyspnea, cough, and wheeze. Patient reports that she had been in her usual state of health until 01/29/2016 when she began to experience exertional dyspnea and worsening in her chronic cough. Found to have CAP and COPD exacerbation, improving  Assessment & Plan:  1. CAP with COPD exacerbation -improving -continue IV Rocephin/azithromycin -IV solumedrol-will cut down dose and transition to prednisone soon -nebs scheduled and PRN -Blood Cx-negative   2. ?Atrial tachycardia -Now in NSR, rate controlled -Initially there was a question of atrial flutter and was started Cardizem and Eliquis per Dr.Ganji, Per Cards, arrhythmias doesn't appear to be aflutter or afib and hence Eliquis stopped -she is also on ASA and plavix for PAD, will stop ASA for now -ECHO with normal EF and grade 2DD  3. Wide complex tachycardia and LBBB -one episode of NSVT -resolved  4. CKD 3 -stable  5. HTN-uncontrolled -resume lisinopril and HCTZ -stable  6. Lung nodule -needs FU  DVT prophylaxis:Lovenox  Code Status:Full Code Family Communication:None at bedside Disposition Plan:Tx to tele, home tomorrow   Consultants:  Cards Dr.Ganji   Subjective: Breathing better  Objective: Vitals:   02/04/16 0700 02/04/16 0748 02/04/16 0940 02/04/16 1156  BP: (!) 183/80  (!) 144/71 (!) 172/72  Pulse: 64  76 77  Resp: 16  20 (!) 21  Temp: 98.2 F (36.8 C)   97.8 F (36.6 C)  TempSrc: Oral   Oral  SpO2: 100% 100% 100% 99%  Weight:      Height:        Intake/Output Summary (Last 24 hours) at 02/04/16 1221 Last data filed at 02/04/16 0900  Gross per 24 hour  Intake             1110 ml  Output             2075 ml  Net             -965 ml   Filed Weights   02/01/16 1649 02/02/16 1555  Weight: 103.9 kg (229 lb) 105.7 kg (233 lb)    Examination:  General exam: Appears calm and comfortable, AAOx3 Respiratory system: Clear to auscultation, improved air movement, rare exp wheezes Cardiovascular system: S1 & S2 heard, RRR. No JVD, murmurs, rubs, gallops or clicks. No pedal edema. Gastrointestinal system: Abdomen is nondistended, soft and nontender. No organomegaly or masses felt. Normal bowel sounds heard. Central nervous system: Alert and oriented. No focal neurological deficits. Extremities: Symmetric 5 x 5 power. Skin: No rashes, lesions or ulcers Psychiatry: Judgement and insight appear normal. Mood & affect appropriate.     Data Reviewed: I have personally reviewed following labs and imaging studies  CBC:  Recent Labs Lab 02/01/16 1647 02/02/16 0429 02/03/16 0107  WBC 15.0* 14.1* 17.8*  NEUTROABS  --  13.0* 15.9*  HGB 11.7* 11.2* 10.7*  HCT 37.1 36.0 34.5*  MCV 93.9 94.2 93.8  PLT 251 243 0000000   Basic Metabolic Panel:  Recent Labs Lab 02/01/16 1647 02/02/16 0429 02/02/16 1750 02/03/16 0111  NA 137 136  --  139  K 4.1 4.3  --  4.1  CL 107 103  --  109  CO2 22 22  --  23  GLUCOSE 157* 425* 462* 255*  BUN 21* 18  --  18  CREATININE 1.18* 1.27*  --  1.19*  CALCIUM 9.3 9.3  --  9.4   GFR: Estimated Creatinine Clearance: 55.8 mL/min (by C-G formula based on SCr of 1.19 mg/dL (H)). Liver Function Tests: No results for input(s): AST, ALT, ALKPHOS, BILITOT, PROT, ALBUMIN in the last 168 hours. No results for input(s): LIPASE, AMYLASE in the last 168 hours. No results for input(s): AMMONIA in the last 168 hours. Coagulation Profile: No results for input(s): INR, PROTIME in the last 168 hours. Cardiac Enzymes:  Recent Labs Lab 02/02/16 0429 02/02/16 0849 02/02/16 1413  TROPONINI <0.03 <0.03 <0.03    BNP (last 3 results) No results for input(s): PROBNP in the last 8760 hours. HbA1C: No results for input(s): HGBA1C in the last 72 hours. CBG:  Recent Labs Lab 02/03/16 1249 02/03/16 1653 02/03/16 2105 02/04/16 0040 02/04/16 0736  GLUCAP 273* 204* 322* 306* 303*   Lipid Profile: No results for input(s): CHOL, HDL, LDLCALC, TRIG, CHOLHDL, LDLDIRECT in the last 72 hours. Thyroid Function Tests: No results for input(s): TSH, T4TOTAL, FREET4, T3FREE, THYROIDAB in the last 72 hours. Anemia Panel: No results for input(s): VITAMINB12, FOLATE, FERRITIN, TIBC, IRON, RETICCTPCT in the last 72 hours. Urine analysis:    Component Value Date/Time   COLORURINE YELLOW 09/04/2013 0052   APPEARANCEUR CLEAR 09/04/2013 0052   LABSPEC 1.017 09/04/2013 0052   PHURINE 5.0 09/04/2013 0052   GLUCOSEU NEGATIVE 09/04/2013 0052   HGBUR TRACE (A) 09/04/2013 0052   BILIRUBINUR NEGATIVE 09/04/2013 0052   KETONESUR NEGATIVE 09/04/2013 0052   PROTEINUR NEGATIVE 09/04/2013 0052   UROBILINOGEN 1.0 09/04/2013 0052   NITRITE NEGATIVE 09/04/2013 0052   LEUKOCYTESUR NEGATIVE 09/04/2013 0052   Sepsis Labs: @LABRCNTIP (procalcitonin:4,lacticidven:4)  ) Recent Results (from the past 240 hour(s))  Blood culture (routine x 2)     Status: None (Preliminary result)   Collection Time: 02/01/16 10:27 PM  Result Value Ref Range Status   Specimen Description BLOOD LEFT ARM  Final   Special Requests BOTTLES DRAWN AEROBIC AND ANAEROBIC 5CC  Final   Culture NO GROWTH 3 DAYS  Final   Report Status PENDING  Incomplete  Blood culture (routine x 2)     Status: None (Preliminary result)   Collection Time: 02/01/16 10:35 PM  Result Value Ref Range Status   Specimen Description BLOOD RIGHT ARM  Final   Special Requests BOTTLES DRAWN AEROBIC AND ANAEROBIC 5CC EACH  Final   Culture NO GROWTH 3 DAYS  Final   Report Status PENDING  Incomplete  MRSA PCR Screening     Status: None   Collection Time: 02/02/16  3:55 PM    Result Value Ref Range Status   MRSA by PCR NEGATIVE NEGATIVE Final    Comment:        The GeneXpert MRSA Assay (FDA approved for NASAL specimens only), is one component of a comprehensive MRSA colonization surveillance program. It is not intended to diagnose MRSA infection nor to guide or monitor treatment for MRSA infections.          Radiology Studies: No results found.      Scheduled Meds: . aspirin  81 mg Oral Daily  . azithromycin  500 mg Intravenous Q24H  . benzonatate  100 mg Oral BID  . cefTRIAXone (ROCEPHIN)  IV  1 g Intravenous Q24H  . clopidogrel  75 mg Oral Daily  . diltiazem  60 mg Oral Q12H  .  enoxaparin (LOVENOX) injection  40 mg Subcutaneous Q24H  . ezetimibe  10 mg Oral Q supper  . famotidine  40 mg Oral BID  . ferrous sulfate  325 mg Oral Q breakfast  . hydrochlorothiazide  12.5 mg Oral Daily  . insulin aspart  0-20 Units Subcutaneous TID WC  . insulin aspart  0-5 Units Subcutaneous QHS  . insulin glargine  25 Units Subcutaneous BID  . lisinopril  20 mg Oral Daily  . methocarbamol  1,000 mg Oral TID AC & HS  . methylPREDNISolone (SOLU-MEDROL) injection  40 mg Intravenous Q12H  . mometasone-formoterol  2 puff Inhalation BID  . pregabalin  200 mg Oral TID  . rosuvastatin  10 mg Oral Daily   Continuous Infusions: . albuterol Stopped (02/02/16 0117)  . albuterol 10 mg/hr (02/02/16 1244)     LOS: 3 days    Time spent: 71min    Domenic Polite, MD [facility] Pager [phone]  If 7PM-7AM, please contact night-coverage [email or web address] Password [email or web address] 02/04/2016, 12:21 PM

## 2016-02-04 NOTE — Progress Notes (Signed)
Inpatient Diabetes Program Recommendations  AACE/ADA: New Consensus Statement on Inpatient Glycemic Control (2015)  Target Ranges:  Prepandial:   less than 140 mg/dL      Peak postprandial:   less than 180 mg/dL (1-2 hours)      Critically ill patients:  140 - 180 mg/dL   Lab Results  Component Value Date   GLUCAP 303 (H) 02/04/2016   HGBA1C 6.9 (H) 10/12/2013    Review of Glycemic Control  Diabetes history: DM 2 Outpatient Diabetes medications: 75/25 22 units BID Current orders for Inpatient glycemic control: Lantus 25 units BID, Novolog Resistant + HS scale   Inpatient Diabetes Program Recommendations:   Glucose in the 300's. IV Solumedrol being decreased. Please consider adding Novolog 5 units meal coverage TID in addition to correction scale if patient consumes at least 50% of meals.  Thanks,  Tama Headings RN, MSN, [facility] Inpatient Diabetes Coordinator Team Pager [phone] (8a-5p)

## 2016-02-05 ENCOUNTER — Inpatient Hospital Stay (HOSPITAL_COMMUNITY): Payer: Medicare Other

## 2016-02-05 LAB — GLUCOSE, CAPILLARY
GLUCOSE-CAPILLARY: 305 mg/dL — AB (ref 65–99)
GLUCOSE-CAPILLARY: 269 mg/dL — AB (ref 65–99)
Glucose-Capillary: 271 mg/dL — ABNORMAL HIGH (ref 65–99)
Glucose-Capillary: 271 mg/dL — ABNORMAL HIGH (ref 65–99)

## 2016-02-05 LAB — CBC
HEMATOCRIT: 38.2 % (ref 36.0–46.0)
HEMOGLOBIN: 12.2 g/dL (ref 12.0–15.0)
MCH: 29.3 pg (ref 26.0–34.0)
MCHC: 31.9 g/dL (ref 30.0–36.0)
MCV: 91.8 fL (ref 78.0–100.0)
Platelets: 277 10*3/uL (ref 150–400)
RBC: 4.16 MIL/uL (ref 3.87–5.11)
RDW: 15.6 % — ABNORMAL HIGH (ref 11.5–15.5)
WBC: 15.5 10*3/uL — ABNORMAL HIGH (ref 4.0–10.5)

## 2016-02-05 LAB — BASIC METABOLIC PANEL
ANION GAP: 8 (ref 5–15)
BUN: 29 mg/dL — ABNORMAL HIGH (ref 6–20)
CALCIUM: 9.5 mg/dL (ref 8.9–10.3)
CHLORIDE: 103 mmol/L (ref 101–111)
CO2: 25 mmol/L (ref 22–32)
Creatinine, Ser: 1.05 mg/dL — ABNORMAL HIGH (ref 0.44–1.00)
GFR calc non Af Amer: 55 mL/min — ABNORMAL LOW (ref >60)
GLUCOSE: 376 mg/dL — AB (ref 65–99)
POTASSIUM: 3.5 mmol/L (ref 3.5–5.1)
Sodium: 136 mmol/L (ref 135–145)

## 2016-02-05 MED ORDER — LEVOFLOXACIN 500 MG PO TABS
500.0000 mg | ORAL_TABLET | Freq: Every day | ORAL | Status: AC
  Administered 2016-02-06 – 2016-02-07 (×2): 500 mg via ORAL
  Filled 2016-02-05 (×2): qty 1

## 2016-02-05 MED ORDER — FUROSEMIDE 10 MG/ML IJ SOLN
40.0000 mg | Freq: Four times a day (QID) | INTRAMUSCULAR | Status: AC
  Administered 2016-02-05 (×2): 40 mg via INTRAVENOUS
  Filled 2016-02-05 (×2): qty 4

## 2016-02-05 MED ORDER — POTASSIUM CHLORIDE CRYS ER 20 MEQ PO TBCR
40.0000 meq | EXTENDED_RELEASE_TABLET | Freq: Two times a day (BID) | ORAL | Status: AC
  Administered 2016-02-05 (×2): 40 meq via ORAL
  Filled 2016-02-05 (×2): qty 2

## 2016-02-05 MED ORDER — PREDNISONE 50 MG PO TABS
50.0000 mg | ORAL_TABLET | Freq: Every day | ORAL | Status: DC
  Administered 2016-02-06 – 2016-02-09 (×4): 50 mg via ORAL
  Filled 2016-02-05 (×4): qty 1

## 2016-02-05 MED ORDER — METHYLPREDNISOLONE SODIUM SUCC 40 MG IJ SOLR
40.0000 mg | Freq: Two times a day (BID) | INTRAMUSCULAR | Status: AC
  Administered 2016-02-05: 40 mg via INTRAVENOUS
  Filled 2016-02-05: qty 1

## 2016-02-05 MED ORDER — AZITHROMYCIN 500 MG PO TABS: 500.0000 mg | ORAL_TABLET | Freq: Every day | ORAL | Status: DC

## 2016-02-05 MED ORDER — INSULIN GLARGINE 100 UNIT/ML ~~LOC~~ SOLN
35.0000 [IU] | Freq: Two times a day (BID) | SUBCUTANEOUS | Status: DC
  Administered 2016-02-05 – 2016-02-09 (×9): 35 [IU] via SUBCUTANEOUS
  Filled 2016-02-05 (×10): qty 0.35

## 2016-02-05 MED ORDER — INSULIN ASPART 100 UNIT/ML ~~LOC~~ SOLN
3.0000 [IU] | Freq: Three times a day (TID) | SUBCUTANEOUS | Status: DC
  Administered 2016-02-05 – 2016-02-06 (×5): 3 [IU] via SUBCUTANEOUS

## 2016-02-05 MED ORDER — DILTIAZEM HCL ER COATED BEADS 120 MG PO CP24
120.0000 mg | ORAL_CAPSULE | Freq: Every day | ORAL | Status: DC
  Administered 2016-02-05 – 2016-02-06 (×2): 120 mg via ORAL
  Filled 2016-02-05 (×2): qty 1

## 2016-02-05 MED ORDER — HYDRALAZINE HCL 25 MG PO TABS
25.0000 mg | ORAL_TABLET | Freq: Three times a day (TID) | ORAL | Status: DC
  Administered 2016-02-05 (×2): 25 mg via ORAL
  Filled 2016-02-05 (×2): qty 1

## 2016-02-05 NOTE — Progress Notes (Signed)
Patient c/o headache above left eye, stating she is also seeing small dots with her left eye. MD notified at 68. No new orders placed at this time.

## 2016-02-05 NOTE — Progress Notes (Signed)
Pt c/o headache 7/10, unrelieved by Norco/Vicodine given at 0600. BP 180/82, HR 71. Hydralazine PRN not due at this time. MD notified at [phone].

## 2016-02-05 NOTE — Progress Notes (Signed)
Pt had change in cardiac rhythm around 0030. Rapid response nurse consulted, EKG completed, MD notified. Pt c/o chest pain with coughing that was resolved with breathing treatment. No further c/o chest pain, heart rhythm back to baseline.

## 2016-02-05 NOTE — Progress Notes (Signed)
RN called for a change in cardiac rhythm. Pt alert and oriented x4, reports cp only when coughing. MD contacted by primary RN pta, CXR, EKG, and breathing treatment completed. Pt's rhythm returned to baseline after coughing episode passed.

## 2016-02-05 NOTE — Progress Notes (Signed)
PROGRESS NOTE    Christina Solis  [id number] DOB: [birth year] DOA: 02/01/2016 PCP: Charlett Blake, MD  Brief Narrative: Christina Solis is a 65 y.o. female with medical history significant for peripheral artery disease, insulin dependent diabetes mellitus, hypertension, GERD, fibromyalgia, and COPD who presents to the emergency department with chest discomfort, dyspnea, cough, and wheeze. Patient reports that she had been in her usual state of health until 01/29/2016 when she began to experience exertional dyspnea and worsening in her chronic cough. Found to have CAP and COPD exacerbation, improving. BP uncontrolled and mild volume overload today requiring IV lasix  Assessment & Plan:  1. CAP with COPD exacerbation -improving -has been on  IV Rocephin/azithromycin, change to PO levaquin -IV solumedrol-will cut down dose and transition to prednisone tomorrow -nebs scheduled and PRN -Blood Cx-negative   2. ?Atrial tachycardia -Now in NSR, rate controlled -Initially there was a question of atrial flutter and was started Cardizem and Eliquis per Dr.Ganji, Per Cards, arrhythmias doesn't appear to be aflutter or afib and hence Eliquis stopped -she is also on ASA and plavix for PAD, will stop ASA for now -ECHO with normal EF and grade 2DD  3. Wide complex tachycardia and LBBB -one episode of NSVT -resolved  4. CKD 3 -stable  5. HTN-uncontrolled -resumed lisinopril , add scheduled hydralazine and IV lasix today  6. Lung nodule -needs FU  DVT prophylaxis:Lovenox  Code Status:Full Code Family Communication:None at bedside Disposition Plan:Tx to tele, home in 1-2days   Consultants:  Cards Dr.Ganji   Subjective: Lot of coughing last pm, still with dyspnea  Objective: Vitals:   02/05/16 0257 02/05/16 0651 02/05/16 0900 02/05/16 1255  BP: (!) 157/63 (!) 183/81 (!) 165/74 (!) 178/82  Pulse: 72 71  73  Resp: 20 18    Temp: 99 F (37.2 C) 98.2 F (36.8 C)    TempSrc:  Oral Oral    SpO2: 100% 98%  99%  Weight:      Height:        Intake/Output Summary (Last 24 hours) at 02/05/16 1354 Last data filed at 02/05/16 1200  Gross per 24 hour  Intake              300 ml  Output             4150 ml  Net            -3850 ml   Filed Weights   02/01/16 1649 02/02/16 1555 02/04/16 1759  Weight: 103.9 kg (229 lb) 105.7 kg (233 lb) 105.9 kg (233 lb 7.5 oz)    Examination:  General exam: Appears calm and comfortable, AAOx3 Respiratory system: few basilar crackles, rare exp wheezes Cardiovascular system: S1 & S2 heard, RRR. No JVD, murmurs, rubs, gallops or clicks. No pedal edema. Gastrointestinal system: Abdomen is nondistended, soft and nontender. No organomegaly or masses felt. Normal bowel sounds heard. Central nervous system: Alert and oriented. No focal neurological deficits. Extremities: Symmetric 5 x 5 power. Skin: No rashes, lesions or ulcers Psychiatry: Judgement and insight appear normal. Mood & affect appropriate.     Data Reviewed: I have personally reviewed following labs and imaging studies  CBC:  Recent Labs Lab 02/01/16 1647 02/02/16 0429 02/03/16 0107 02/05/16 0552  WBC 15.0* 14.1* 17.8* 15.5*  NEUTROABS  --  13.0* 15.9*  --   HGB 11.7* 11.2* 10.7* 12.2  HCT 37.1 36.0 34.5* 38.2  MCV 93.9 94.2 93.8 91.8  PLT 251 243 258 277  Basic Metabolic Panel:  Recent Labs Lab 02/01/16 1647 02/02/16 0429 02/02/16 1750 02/03/16 0111 02/05/16 0552  NA 137 136  --  139 136  K 4.1 4.3  --  4.1 3.5  CL 107 103  --  109 103  CO2 22 22  --  23 25  GLUCOSE 157* 425* 462* 255* 376*  BUN 21* 18  --  18 29*  CREATININE 1.18* 1.27*  --  1.19* 1.05*  CALCIUM 9.3 9.3  --  9.4 9.5   GFR: Estimated Creatinine Clearance: 62.7 mL/min (by C-G formula based on SCr of 1.05 mg/dL (H)). Liver Function Tests: No results for input(s): AST, ALT, ALKPHOS, BILITOT, PROT, ALBUMIN in the last 168 hours. No results for input(s): LIPASE, AMYLASE in the  last 168 hours. No results for input(s): AMMONIA in the last 168 hours. Coagulation Profile: No results for input(s): INR, PROTIME in the last 168 hours. Cardiac Enzymes:  Recent Labs Lab 02/02/16 0429 02/02/16 0849 02/02/16 1413  TROPONINI <0.03 <0.03 <0.03   BNP (last 3 results) No results for input(s): PROBNP in the last 8760 hours. HbA1C: No results for input(s): HGBA1C in the last 72 hours. CBG:  Recent Labs Lab 02/04/16 1154 02/04/16 1715 02/04/16 2103 02/05/16 0828 02/05/16 1153  GLUCAP 342* 278* 288* 305* 271*   Lipid Profile: No results for input(s): CHOL, HDL, LDLCALC, TRIG, CHOLHDL, LDLDIRECT in the last 72 hours. Thyroid Function Tests: No results for input(s): TSH, T4TOTAL, FREET4, T3FREE, THYROIDAB in the last 72 hours. Anemia Panel: No results for input(s): VITAMINB12, FOLATE, FERRITIN, TIBC, IRON, RETICCTPCT in the last 72 hours. Urine analysis:    Component Value Date/Time   COLORURINE YELLOW 09/04/2013 0052   APPEARANCEUR CLEAR 09/04/2013 0052   LABSPEC 1.017 09/04/2013 0052   PHURINE 5.0 09/04/2013 0052   GLUCOSEU NEGATIVE 09/04/2013 0052   HGBUR TRACE (A) 09/04/2013 0052   BILIRUBINUR NEGATIVE 09/04/2013 0052   KETONESUR NEGATIVE 09/04/2013 0052   PROTEINUR NEGATIVE 09/04/2013 0052   UROBILINOGEN 1.0 09/04/2013 0052   NITRITE NEGATIVE 09/04/2013 0052   LEUKOCYTESUR NEGATIVE 09/04/2013 0052   Sepsis Labs: @LABRCNTIP (procalcitonin:4,lacticidven:4)  ) Recent Results (from the past 240 hour(s))  Blood culture (routine x 2)     Status: None (Preliminary result)   Collection Time: 02/01/16 10:27 PM  Result Value Ref Range Status   Specimen Description BLOOD LEFT ARM  Final   Special Requests BOTTLES DRAWN AEROBIC AND ANAEROBIC 5CC  Final   Culture NO GROWTH 4 DAYS  Final   Report Status PENDING  Incomplete  Blood culture (routine x 2)     Status: None (Preliminary result)   Collection Time: 02/01/16 10:35 PM  Result Value Ref Range Status     Specimen Description BLOOD RIGHT ARM  Final   Special Requests BOTTLES DRAWN AEROBIC AND ANAEROBIC 5CC EACH  Final   Culture NO GROWTH 4 DAYS  Final   Report Status PENDING  Incomplete  MRSA PCR Screening     Status: None   Collection Time: 02/02/16  3:55 PM  Result Value Ref Range Status   MRSA by PCR NEGATIVE NEGATIVE Final    Comment:        The GeneXpert MRSA Assay (FDA approved for NASAL specimens only), is one component of a comprehensive MRSA colonization surveillance program. It is not intended to diagnose MRSA infection nor to guide or monitor treatment for MRSA infections.          Radiology Studies: Dg Chest [facility]  Result Date: 02/05/2016 CLINICAL DATA:  Acute onset of wheezing.  Initial encounter. EXAM: PORTABLE CHEST 1 VIEW COMPARISON:  Chest radiograph and CTA of the chest performed 02/01/2016 FINDINGS: The lungs are well-aerated. Mild vascular congestion is noted. Increased interstitial markings may reflect mild interstitial edema. There is no evidence of pleural effusion or pneumothorax. The cardiomediastinal silhouette is mildly enlarged. No acute osseous abnormalities are seen. IMPRESSION: Mild vascular congestion and mild cardiomegaly. Increased interstitial markings may reflect mild interstitial edema. Electronically Signed   By: Garald Balding M.D.   On: 02/05/2016 01:52        Scheduled Meds: . aspirin  81 mg Oral Daily  . azithromycin  500 mg Oral Daily  . benzonatate  100 mg Oral BID  . cefTRIAXone (ROCEPHIN)  IV  1 g Intravenous Q24H  . clopidogrel  75 mg Oral Daily  . diltiazem  60 mg Oral Q12H  . enoxaparin (LOVENOX) injection  40 mg Subcutaneous Q24H  . ezetimibe  10 mg Oral Q supper  . famotidine  40 mg Oral BID  . ferrous sulfate  325 mg Oral Q breakfast  . furosemide  40 mg Intravenous Q6H  . hydrALAZINE  25 mg Oral Q8H  . insulin aspart  0-20 Units Subcutaneous TID WC  . insulin aspart  0-5 Units Subcutaneous QHS  . insulin  aspart  3 Units Subcutaneous TID WC  . insulin glargine  35 Units Subcutaneous BID  . lisinopril  20 mg Oral Daily  . methocarbamol  1,000 mg Oral TID AC & HS  . methylPREDNISolone (SOLU-MEDROL) injection  40 mg Intravenous Q12H  . mometasone-formoterol  2 puff Inhalation BID  . potassium chloride  40 mEq Oral BID  . pregabalin  200 mg Oral TID  . rosuvastatin  10 mg Oral Daily   Continuous Infusions: . albuterol Stopped (02/02/16 0117)  . albuterol 10 mg/hr (02/02/16 1244)     LOS: 4 days    Time spent: 55min    Domenic Polite, MD [facility] Pager [phone]  If 7PM-7AM, please contact night-coverage [email or web address] Password [email or web address] 02/05/2016, 1:54 PM

## 2016-02-06 DIAGNOSIS — R062 Wheezing: Secondary | ICD-10-CM

## 2016-02-06 DIAGNOSIS — IMO0002 Reserved for concepts with insufficient information to code with codable children: Secondary | ICD-10-CM

## 2016-02-06 DIAGNOSIS — E1165 Type 2 diabetes mellitus with hyperglycemia: Secondary | ICD-10-CM

## 2016-02-06 DIAGNOSIS — J441 Chronic obstructive pulmonary disease with (acute) exacerbation: Secondary | ICD-10-CM

## 2016-02-06 DIAGNOSIS — I447 Left bundle-branch block, unspecified: Secondary | ICD-10-CM

## 2016-02-06 DIAGNOSIS — I5032 Chronic diastolic (congestive) heart failure: Secondary | ICD-10-CM

## 2016-02-06 DIAGNOSIS — I471 Supraventricular tachycardia: Secondary | ICD-10-CM

## 2016-02-06 DIAGNOSIS — E118 Type 2 diabetes mellitus with unspecified complications: Secondary | ICD-10-CM

## 2016-02-06 LAB — BASIC METABOLIC PANEL
ANION GAP: 10 (ref 5–15)
BUN: 38 mg/dL — ABNORMAL HIGH (ref 6–20)
CALCIUM: 9.4 mg/dL (ref 8.9–10.3)
CHLORIDE: 100 mmol/L — AB (ref 101–111)
CO2: 28 mmol/L (ref 22–32)
CREATININE: 1.27 mg/dL — AB (ref 0.44–1.00)
GFR calc Af Amer: 51 mL/min — ABNORMAL LOW (ref >60)
GFR calc non Af Amer: 44 mL/min — ABNORMAL LOW (ref >60)
GLUCOSE: 222 mg/dL — AB (ref 65–99)
Potassium: 4.5 mmol/L (ref 3.5–5.1)
Sodium: 138 mmol/L (ref 135–145)

## 2016-02-06 LAB — CULTURE, BLOOD (ROUTINE X 2)
CULTURE: NO GROWTH
Culture: NO GROWTH

## 2016-02-06 LAB — GLUCOSE, CAPILLARY
GLUCOSE-CAPILLARY: 265 mg/dL — AB (ref 65–99)
Glucose-Capillary: 173 mg/dL — ABNORMAL HIGH (ref 65–99)
Glucose-Capillary: 226 mg/dL — ABNORMAL HIGH (ref 65–99)
Glucose-Capillary: 295 mg/dL — ABNORMAL HIGH (ref 65–99)

## 2016-02-06 LAB — CBC
HCT: 38.7 % (ref 36.0–46.0)
HEMOGLOBIN: 12.3 g/dL (ref 12.0–15.0)
MCH: 29.2 pg (ref 26.0–34.0)
MCHC: 31.8 g/dL (ref 30.0–36.0)
MCV: 91.9 fL (ref 78.0–100.0)
Platelets: 273 10*3/uL (ref 150–400)
RBC: 4.21 MIL/uL (ref 3.87–5.11)
RDW: 15.4 % (ref 11.5–15.5)
WBC: 15.7 10*3/uL — ABNORMAL HIGH (ref 4.0–10.5)

## 2016-02-06 MED ORDER — INSULIN ASPART 100 UNIT/ML ~~LOC~~ SOLN
10.0000 [IU] | Freq: Three times a day (TID) | SUBCUTANEOUS | Status: DC
  Administered 2016-02-07 – 2016-02-09 (×6): 10 [IU] via SUBCUTANEOUS

## 2016-02-06 MED ORDER — HYDRALAZINE HCL 20 MG/ML IJ SOLN
10.0000 mg | Freq: Three times a day (TID) | INTRAMUSCULAR | Status: DC | PRN
  Administered 2016-02-06: 10 mg via INTRAVENOUS
  Filled 2016-02-06: qty 1

## 2016-02-06 MED ORDER — DILTIAZEM HCL ER COATED BEADS 180 MG PO CP24
180.0000 mg | ORAL_CAPSULE | Freq: Every day | ORAL | Status: DC
  Administered 2016-02-07 – 2016-02-09 (×3): 180 mg via ORAL
  Filled 2016-02-06 (×3): qty 1

## 2016-02-06 NOTE — Progress Notes (Signed)
PROGRESS NOTE    Christina Solis  [id number] DOB: [birth year] DOA: 02/01/2016 PCP: Charlett Blake, MD     Brief Narrative:   65 y.o. BF PMHx Anxiety, Fibromyalgia,, PAD, DM Type 2, CHF, HTN, HLD, GERD,COPD ,Pituitary tumor, Chronic back pain  Presents to the emergency department with chest discomfort, dyspnea, cough, and wheeze. Patient reports that she had been in her usual state of health until 01/29/2016 when she began to experience exertional dyspnea and worsening in her chronic cough. As symptoms progressed, she also developed a discomfort in the central chest which was worse with deep inspiration or coughing. There has been some chills associated with this, but no fevers. Patient has continued to use her home nebulizer and inhaler but these have seemed to become ineffective over the past couple days and she has been unable to get relief. Chest discomfort is described as a burning sensation in the central chest without radiation and without alleviating factors. She has been wheezing and her cough is increased. She reports the cough to be nonproductive, but has a sensation that she needs to cough something up. Patient denies headache, lightheadedness, change in vision or hearing, or loss of coordination. There has been no abdominal pain, vomiting, diarrhea, melena, or hematochezia.  ED Course: Upon arrival to the ED, patient is found to be afebrile, saturating adequately on room air, mildly tachycardic, and with vitals otherwise stable. EKG demonstrates a sinus tachycardia with rate 106 and nonspecific intraventricular conduction delay. Chest x-ray is notable for mild cardiomegaly and suggestion of mild pulmonary vascular congestion. Chemistry panel features a serum creatinine of 1.18, up from an apparent baseline of 1.05. CBC is notable for a leukocytosis to 15,000 and a normocytic anemia with hemoglobin of 11.7. Initial troponin is within normal limits at 0.01, and a second troponin  repeated 3 hours later is undetectable. There was some concern for possible PE and a CTA was performed with PE protocol. CTA was negative for PE, but notable for patchy bilateral airspace disease likely representing a multifocal pneumonia with follow-up CT recommended in one month. Also noted on CT incidentally is a 5 mm nodule in the right lower lung with follow-up recommended in one year. Patient was given a 500 mL normal saline bolus in the emergency department, duo nebs, 125 mg IV push of Solu-Medrol, and empiric treatment with Rocephin and azithromycin. Symptomatic care was provided with Tylenol and morphine. Tachycardia resolved while still in the emergency department of the patient's respiratory status has improved significantly with nebulizers. She will be admitted to the telemetry unit for ongoing evaluation and management of community-acquired pneumonia and acute exacerbation and COPD.   Subjective: 9/21  A/O 4, feels significantly improved but still has some residual SOB   Assessment & Plan:   Principal Problem:   CAP (community acquired pneumonia) Active Problems:   Insulin dependent diabetes mellitus ([place])   PAD (peripheral artery disease) (HCC)   HTN (hypertension)   COPD with acute exacerbation ([facility])   Incidental lung nodule, > 17mm and < 44mm   CKD (chronic kidney disease), stage III   COPD exacerbation ([facility])   Wheezing   Atrial tachycardia ([facility])   Left bundle branch block   Chronic diastolic CHF (congestive heart failure) ([place])   Uncontrolled type 2 diabetes mellitus with complication ([place])    CAP with COPD exacerbation -improving -has been on  IV Rocephin/azithromycin, change to PO levaquin. Complete 5 day course antibiotics -DC Solu-Medrol and start patient on Prednisone 50 mg daily  -  nebs scheduled and PRN -Blood Cx-negative  -Ambulatory SPO2 on 9/22  Atrial tachycardia -Now in NSR, rate controlled -Initially there was a question of atrial flutter and was  started Cardizem and Eliquis per Dr.Ganji, Per Cards, arrhythmias doesn't appear to be aflutter or afib and hence Eliquis stopped -she is also on ASA and plavix for PAD, will stop ASA for now -ECHO with normal EF and grade 2DD  Wide complex tachycardia and LBBB -one episode of NSVT -resolved  HTN-uncontrolled -Lisinopril 20 mg daily -Increase Cardizem 180 mg daily -Hydralazine  PRN  , add scheduled hydralazine and IV lasix today   CKD stage 3 Lab Results  Component Value Date   CREATININE 1.27 (H) 02/06/2016   CREATININE 1.05 (H) 02/05/2016   CREATININE 1.19 (H) 02/03/2016   Chronic diastolic CHF -Unknown base weight -Strict I and O -Daily weight -Would not use beta blocker secondary to patient's lung disease  Lung nodule -Workup as outpatient   DM type II uncontrolled with complications -Hemoglobin A1c pending -Lipid panel pending -Lantus 35 units BID -NovoLog 10 units QAC -Resistant SSI   DVT prophylaxis: Lovenox Code Status: Full Family Communication: None Disposition Plan: Home   Consultants:  Cards Dr.Ganji    Procedures/Significant Events:  9/18 Echocardiogram: LVEF = 55% to 60%. -(grade 2 diastolic dysfunction).     Cultures 9/16 blood left/right arm NGTD 9/17 MRSA by PCR negative    Antimicrobials: Azithromycin 9/16>> 9/20 Ceftriaxone 9/16>> 9/20 Levofloxacin 9/21>>   Devices    LINES / TUBES:      Continuous Infusions: . albuterol Stopped (02/02/16 0117)  . albuterol 10 mg/hr (02/02/16 1244)     Objective: Vitals:   02/06/16 0533 02/06/16 0709 02/06/16 1326 02/06/16 1953  BP: (!) 152/76  (!) 175/87   Pulse: (!) 58 60 69   Resp: 20 20 20    Temp: 97.7 F (36.5 C)  98.5 F (36.9 C)   TempSrc: Oral  Oral   SpO2: 96% 95% 96% 98%  Weight:      Height:        Intake/Output Summary (Last 24 hours) at 02/06/16 2122 Last data filed at 02/06/16 1423  Gross per 24 hour  Intake              540 ml  Output              1565 ml  Net            -1025 ml   Filed Weights   02/01/16 1649 02/02/16 1555 02/04/16 1759  Weight: 103.9 kg (229 lb) 105.7 kg (233 lb) 105.9 kg (233 lb 7.5 oz)    Examination:  General: A/O 4, positive acute respiratory distress Eyes: negative scleral hemorrhage, negative anisocoria, negative icterus ENT: Negative Runny nose, negative gingival bleeding, Neck:  Negative scars, masses, torticollis, lymphadenopathy, JVD Lungs: effuse expiratory wheezing, negative crackles Cardiovascular: Regular rate and rhythm without murmur gallop or rub normal S1 and S2 Abdomen: negative abdominal pain, nondistended, positive soft, bowel sounds, no rebound, no ascites, no appreciable mass Extremities: No significant cyanosis, clubbing, or edema bilateral lower extremities Skin: Negative rashes, lesions, ulcers Psychiatric:  Negative depression, negative anxiety, negative fatigue, negative mania  Central nervous system:  Cranial nerves II through XII intact, tongue/uvula midline, all extremities muscle strength 5/5, sensation intact throughout, negative dysarthria, negative expressive aphasia, negative receptive aphasia.  .     Data Reviewed: Care during the described time interval was provided by me .  I have reviewed  this patient's available data, including medical history, events of note, physical examination, and all test results as part of my evaluation. I have personally reviewed and interpreted all radiology studies.  CBC:  Recent Labs Lab 02/01/16 1647 02/02/16 0429 02/03/16 0107 02/05/16 0552 02/06/16 0505  WBC 15.0* 14.1* 17.8* 15.5* 15.7*  NEUTROABS  --  13.0* 15.9*  --   --   HGB 11.7* 11.2* 10.7* 12.2 12.3  HCT 37.1 36.0 34.5* 38.2 38.7  MCV 93.9 94.2 93.8 91.8 91.9  PLT 251 243 258 277 [id number]   Basic Metabolic Panel:  Recent Labs Lab 02/01/16 1647 02/02/16 0429 02/02/16 1750 02/03/16 0111 02/05/16 0552 02/06/16 0505  NA 137 136  --  139 136 138  K 4.1 4.3  --  4.1  3.5 4.5  CL 107 103  --  109 103 100*  CO2 22 22  --  23 25 28   GLUCOSE 157* 425* 462* 255* 376* 222*  BUN 21* 18  --  18 29* 38*  CREATININE 1.18* 1.27*  --  1.19* 1.05* 1.27*  CALCIUM 9.3 9.3  --  9.4 9.5 9.4   GFR: Estimated Creatinine Clearance: 51.8 mL/min (by C-G formula based on SCr of 1.27 mg/dL (H)). Liver Function Tests: No results for input(s): AST, ALT, ALKPHOS, BILITOT, PROT, ALBUMIN in the last 168 hours. No results for input(s): LIPASE, AMYLASE in the last 168 hours. No results for input(s): AMMONIA in the last 168 hours. Coagulation Profile: No results for input(s): INR, PROTIME in the last 168 hours. Cardiac Enzymes:  Recent Labs Lab 02/02/16 0429 02/02/16 0849 02/02/16 1413  TROPONINI <0.03 <0.03 <0.03   BNP (last 3 results) No results for input(s): PROBNP in the last 8760 hours. HbA1C: No results for input(s): HGBA1C in the last 72 hours. CBG:  Recent Labs Lab 02/05/16 2114 02/06/16 0834 02/06/16 1210 02/06/16 1655 02/06/16 2109  GLUCAP 269* 173* 226* 295* 265*   Lipid Profile: No results for input(s): CHOL, HDL, LDLCALC, TRIG, CHOLHDL, LDLDIRECT in the last 72 hours. Thyroid Function Tests: No results for input(s): TSH, T4TOTAL, FREET4, T3FREE, THYROIDAB in the last 72 hours. Anemia Panel: No results for input(s): VITAMINB12, FOLATE, FERRITIN, TIBC, IRON, RETICCTPCT in the last 72 hours. Sepsis Labs: No results for input(s): PROCALCITON, LATICACIDVEN in the last 168 hours.  Recent Results (from the past 240 hour(s))  Blood culture (routine x 2)     Status: None   Collection Time: 02/01/16 10:27 PM  Result Value Ref Range Status   Specimen Description BLOOD LEFT ARM  Final   Special Requests BOTTLES DRAWN AEROBIC AND ANAEROBIC 5CC  Final   Culture NO GROWTH 5 DAYS  Final   Report Status 02/06/2016 FINAL  Final  Blood culture (routine x 2)     Status: None   Collection Time: 02/01/16 10:35 PM  Result Value Ref Range Status   Specimen  Description BLOOD RIGHT ARM  Final   Special Requests BOTTLES DRAWN AEROBIC AND ANAEROBIC 5CC EACH  Final   Culture NO GROWTH 5 DAYS  Final   Report Status 02/06/2016 FINAL  Final  MRSA PCR Screening     Status: None   Collection Time: 02/02/16  3:55 PM  Result Value Ref Range Status   MRSA by PCR NEGATIVE NEGATIVE Final    Comment:        The GeneXpert MRSA Assay (FDA approved for NASAL specimens only), is one component of a comprehensive MRSA colonization surveillance program. It is not intended to  diagnose MRSA infection nor to guide or monitor treatment for MRSA infections.          Radiology Studies: Dg Chest Port 1 View  Result Date: 02/05/2016 CLINICAL DATA:  Acute onset of wheezing.  Initial encounter. EXAM: PORTABLE CHEST 1 VIEW COMPARISON:  Chest radiograph and CTA of the chest performed 02/01/2016 FINDINGS: The lungs are well-aerated. Mild vascular congestion is noted. Increased interstitial markings may reflect mild interstitial edema. There is no evidence of pleural effusion or pneumothorax. The cardiomediastinal silhouette is mildly enlarged. No acute osseous abnormalities are seen. IMPRESSION: Mild vascular congestion and mild cardiomegaly. Increased interstitial markings may reflect mild interstitial edema. Electronically Signed   By: Garald Balding M.D.   On: 02/05/2016 01:52        Scheduled Meds: . aspirin  81 mg Oral Daily  . benzonatate  100 mg Oral BID  . clopidogrel  75 mg Oral Daily  . [START ON 02/07/2016] diltiazem  180 mg Oral Daily  . enoxaparin (LOVENOX) injection  40 mg Subcutaneous Q24H  . ezetimibe  10 mg Oral Q supper  . famotidine  40 mg Oral BID  . ferrous sulfate  325 mg Oral Q breakfast  . insulin aspart  0-20 Units Subcutaneous TID WC  . insulin aspart  0-5 Units Subcutaneous QHS  . [START ON 02/07/2016] insulin aspart  10 Units Subcutaneous TID WC  . insulin glargine  35 Units Subcutaneous BID  . levofloxacin  500 mg Oral Daily  .  lisinopril  20 mg Oral Daily  . methocarbamol  1,000 mg Oral TID AC & HS  . mometasone-formoterol  2 puff Inhalation BID  . predniSONE  50 mg Oral Q breakfast  . pregabalin  200 mg Oral TID  . rosuvastatin  10 mg Oral Daily   Continuous Infusions: . albuterol Stopped (02/02/16 0117)  . albuterol 10 mg/hr (02/02/16 1244)     LOS: 5 days    Time spent:40 min    Timm Bonenberger, Geraldo Docker, MD [facility] Pager [phone]  If 7PM-7AM, please contact night-coverage [email or web address] Password [email or web address] 02/06/2016, 9:22 PM

## 2016-02-07 LAB — BASIC METABOLIC PANEL
Anion gap: 9 (ref 5–15)
BUN: 34 mg/dL — AB (ref 6–20)
CHLORIDE: 102 mmol/L (ref 101–111)
CO2: 28 mmol/L (ref 22–32)
Calcium: 8.9 mg/dL (ref 8.9–10.3)
Creatinine, Ser: 1.23 mg/dL — ABNORMAL HIGH (ref 0.44–1.00)
GFR calc Af Amer: 53 mL/min — ABNORMAL LOW (ref >60)
GFR calc non Af Amer: 46 mL/min — ABNORMAL LOW (ref >60)
GLUCOSE: 88 mg/dL (ref 65–99)
POTASSIUM: 3.3 mmol/L — AB (ref 3.5–5.1)
Sodium: 139 mmol/L (ref 135–145)

## 2016-02-07 LAB — GLUCOSE, CAPILLARY
GLUCOSE-CAPILLARY: 170 mg/dL — AB (ref 65–99)
Glucose-Capillary: 105 mg/dL — ABNORMAL HIGH (ref 65–99)
Glucose-Capillary: 226 mg/dL — ABNORMAL HIGH (ref 65–99)
Glucose-Capillary: 246 mg/dL — ABNORMAL HIGH (ref 65–99)

## 2016-02-07 LAB — LIPID PANEL
Cholesterol: 113 mg/dL (ref 0–200)
HDL: 30 mg/dL — AB (ref >40)
LDL CALC: 48 mg/dL (ref 0–99)
TRIGLYCERIDES: 174 mg/dL — AB (ref <150)
Total CHOL/HDL Ratio: 3.8 RATIO
VLDL: 35 mg/dL (ref 0–40)

## 2016-02-07 LAB — MAGNESIUM: Magnesium: 2.3 mg/dL (ref 1.7–2.4)

## 2016-02-07 MED ORDER — POTASSIUM CHLORIDE CRYS ER 20 MEQ PO TBCR
20.0000 meq | EXTENDED_RELEASE_TABLET | Freq: Once | ORAL | Status: AC
  Administered 2016-02-07: 20 meq via ORAL
  Filled 2016-02-07: qty 1

## 2016-02-07 NOTE — Care Management Important Message (Signed)
Important Message  Patient Details  Name: Christina Solis MRN: [id number] Date of Birth: 1951/03/25   Medicare Important Message Given:  Yes    Johnnay Pleitez 02/07/2016, 1:42 PM

## 2016-02-07 NOTE — Progress Notes (Addendum)
PROGRESS NOTE    Christina Solis  [id number] DOB: 14-Mar-1951 DOA: 02/01/2016 PCP: Charlett Blake, MD  Brief Narrative: Christina Solis is a 65 y.o. female with medical history significant for peripheral artery disease, insulin dependent diabetes mellitus, hypertension, GERD, fibromyalgia, and COPD who presents to the emergency department with chest discomfort, dyspnea, cough, and wheeze. Patient reports that she had been in her usual state of health until 01/29/2016 when she began to experience exertional dyspnea and worsening in her chronic cough. Found to have CAP and COPD exacerbation, improving. BP uncontrolled and mild volume overload today requiring IV lasix  Assessment & Plan:  1. CAP with COPD exacerbation -improving -has been on IV Rocephin/azithromycin, changed to PO levaquin last dose tomorrow. -Currently on prednisone. Pt still wheezing. -nebs scheduled and PRN -Blood Cx-negative   2. ?Atrial tachycardia -Now in NSR, rate controlled -Initially there was a question of atrial flutter and was started Cardizem and Eliquis per Dr.Ganji, Per Cards, arrhythmias doesn't appear to be aflutter or afib and hence Eliquis stopped -she is also on ASA and plavix for PAD, aspirin held -ECHO with normal EF and grade 2DD  3. Wide complex tachycardia and LBBB -one episode of NSVT -resolved  4. CKD 3 -stable  5. HTN-uncontrolled -resumed lisinopril , add scheduled hydralazine and IV lasix today  6. Lung nodule -needs FU  7. Hypokalemia - Replace and reassess  DVT prophylaxis:Lovenox  Code Status:Full Code Family Communication:None at bedside Disposition Plan:Tx to tele, home in 1-2days   Consultants:  Cards Dr.Ganji   Subjective: Pt has no new complaints.   Objective: Vitals:   02/06/16 1953 02/06/16 2124 02/07/16 0546 02/07/16 1517  BP:  (!) 172/81 (!) 134/58 132/61  Pulse:  69 77 71  Resp:  18 18   Temp:  98.1 F (36.7 C) 98.2 F (36.8 C) 98 F (36.7 C)    TempSrc:  Oral Oral Oral  SpO2: 98% 98% 92% 99%  Weight:      Height:        Intake/Output Summary (Last 24 hours) at 02/07/16 1637 Last data filed at 02/07/16 1500  Gross per 24 hour  Intake              240 ml  Output             3575 ml  Net            -3335 ml   Filed Weights   02/01/16 1649 02/02/16 1555 02/04/16 1759  Weight: 103.9 kg (229 lb) 105.7 kg (233 lb) 105.9 kg (233 lb 7.5 oz)    Examination:  General exam: Appears calm and comfortable, AAOx3 Respiratory system: few basilar crackles, rare exp wheezes Cardiovascular system: S1 & S2 heard, RRR. No JVD, murmurs, rubs, gallops or clicks. No pedal edema. Gastrointestinal system: Abdomen is nondistended, soft and nontender. No organomegaly or masses felt. Normal bowel sounds heard. Central nervous system: Alert and oriented. No focal neurological deficits. Extremities: Symmetric 5 x 5 power. Skin: No rashes, lesions or ulcers Psychiatry: Judgement and insight appear normal. Mood & affect appropriate.     Data Reviewed: I have personally reviewed following labs and imaging studies  CBC:  Recent Labs Lab 02/01/16 1647 02/02/16 0429 02/03/16 0107 02/05/16 0552 02/06/16 0505  WBC 15.0* 14.1* 17.8* 15.5* 15.7*  NEUTROABS  --  13.0* 15.9*  --   --   HGB 11.7* 11.2* 10.7* 12.2 12.3  HCT 37.1 36.0 34.5* 38.2 38.7  MCV 93.9 94.2  93.8 91.8 91.9  PLT 251 243 258 277 [id number]   Basic Metabolic Panel:  Recent Labs Lab 02/02/16 0429 02/02/16 1750 02/03/16 0111 02/05/16 0552 02/06/16 0505 02/07/16 0659  NA 136  --  139 136 138 139  K 4.3  --  4.1 3.5 4.5 3.3*  CL 103  --  109 103 100* 102  CO2 22  --  23 25 28 28   GLUCOSE 425* 462* 255* 376* 222* 88  BUN 18  --  18 29* 38* 34*  CREATININE 1.27*  --  1.19* 1.05* 1.27* 1.23*  CALCIUM 9.3  --  9.4 9.5 9.4 8.9  MG  --   --   --   --   --  2.3   GFR: Estimated Creatinine Clearance: 53.5 mL/min (by C-G formula based on SCr of 1.23 mg/dL (H)). Liver Function  Tests: No results for input(s): AST, ALT, ALKPHOS, BILITOT, PROT, ALBUMIN in the last 168 hours. No results for input(s): LIPASE, AMYLASE in the last 168 hours. No results for input(s): AMMONIA in the last 168 hours. Coagulation Profile: No results for input(s): INR, PROTIME in the last 168 hours. Cardiac Enzymes:  Recent Labs Lab 02/02/16 0429 02/02/16 0849 02/02/16 1413  TROPONINI <0.03 <0.03 <0.03   BNP (last 3 results) No results for input(s): PROBNP in the last 8760 hours. HbA1C: No results for input(s): HGBA1C in the last 72 hours. CBG:  Recent Labs Lab 02/06/16 1210 02/06/16 1655 02/06/16 2109 02/07/16 0739 02/07/16 1216  GLUCAP 226* 295* 265* 105* 226*   Lipid Profile:  Recent Labs  02/07/16 0659  CHOL 113  HDL 30*  LDLCALC 48  TRIG 174*  CHOLHDL 3.8   Thyroid Function Tests: No results for input(s): TSH, T4TOTAL, FREET4, T3FREE, THYROIDAB in the last 72 hours. Anemia Panel: No results for input(s): VITAMINB12, FOLATE, FERRITIN, TIBC, IRON, RETICCTPCT in the last 72 hours. Urine analysis:    Component Value Date/Time   COLORURINE YELLOW 09/04/2013 0052   APPEARANCEUR CLEAR 09/04/2013 0052   LABSPEC 1.017 09/04/2013 0052   PHURINE 5.0 09/04/2013 0052   GLUCOSEU NEGATIVE 09/04/2013 0052   HGBUR TRACE (A) 09/04/2013 0052   BILIRUBINUR NEGATIVE 09/04/2013 0052   KETONESUR NEGATIVE 09/04/2013 0052   PROTEINUR NEGATIVE 09/04/2013 0052   UROBILINOGEN 1.0 09/04/2013 0052   NITRITE NEGATIVE 09/04/2013 0052   LEUKOCYTESUR NEGATIVE 09/04/2013 0052   Sepsis Labs: @LABRCNTIP (procalcitonin:4,lacticidven:4)  ) Recent Results (from the past 240 hour(s))  Blood culture (routine x 2)     Status: None   Collection Time: 02/01/16 10:27 PM  Result Value Ref Range Status   Specimen Description BLOOD LEFT ARM  Final   Special Requests BOTTLES DRAWN AEROBIC AND ANAEROBIC 5CC  Final   Culture NO GROWTH 5 DAYS  Final   Report Status 02/06/2016 FINAL  Final    Blood culture (routine x 2)     Status: None   Collection Time: 02/01/16 10:35 PM  Result Value Ref Range Status   Specimen Description BLOOD RIGHT ARM  Final   Special Requests BOTTLES DRAWN AEROBIC AND ANAEROBIC 5CC EACH  Final   Culture NO GROWTH 5 DAYS  Final   Report Status 02/06/2016 FINAL  Final  MRSA PCR Screening     Status: None   Collection Time: 02/02/16  3:55 PM  Result Value Ref Range Status   MRSA by PCR NEGATIVE NEGATIVE Final    Comment:        The GeneXpert MRSA Assay (FDA approved for NASAL  specimens only), is one component of a comprehensive MRSA colonization surveillance program. It is not intended to diagnose MRSA infection nor to guide or monitor treatment for MRSA infections.          Radiology Studies: No results found.      Scheduled Meds: . aspirin  81 mg Oral Daily  . benzonatate  100 mg Oral BID  . clopidogrel  75 mg Oral Daily  . diltiazem  180 mg Oral Daily  . enoxaparin (LOVENOX) injection  40 mg Subcutaneous Q24H  . ezetimibe  10 mg Oral Q supper  . famotidine  40 mg Oral BID  . ferrous sulfate  325 mg Oral Q breakfast  . insulin aspart  0-20 Units Subcutaneous TID WC  . insulin aspart  0-5 Units Subcutaneous QHS  . insulin aspart  10 Units Subcutaneous TID WC  . insulin glargine  35 Units Subcutaneous BID  . lisinopril  20 mg Oral Daily  . methocarbamol  1,000 mg Oral TID AC & HS  . mometasone-formoterol  2 puff Inhalation BID  . predniSONE  50 mg Oral Q breakfast  . pregabalin  200 mg Oral TID  . rosuvastatin  10 mg Oral Daily   Continuous Infusions: . albuterol Stopped (02/02/16 0117)  . albuterol 10 mg/hr (02/02/16 1244)     LOS: 6 days    Time spent: 25min   Addendum: Velvet Bathe  [facility] Pager [phone]  If 7PM-7AM, please contact night-coverage [email or web address] Password [email or web address] 02/07/2016, 4:37 PM

## 2016-02-08 LAB — BASIC METABOLIC PANEL
ANION GAP: 10 (ref 5–15)
BUN: 31 mg/dL — ABNORMAL HIGH (ref 6–20)
CO2: 28 mmol/L (ref 22–32)
Calcium: 9.2 mg/dL (ref 8.9–10.3)
Chloride: 105 mmol/L (ref 101–111)
Creatinine, Ser: 1.17 mg/dL — ABNORMAL HIGH (ref 0.44–1.00)
GFR calc Af Amer: 56 mL/min — ABNORMAL LOW (ref >60)
GFR, EST NON AFRICAN AMERICAN: 49 mL/min — AB (ref >60)
GLUCOSE: 78 mg/dL (ref 65–99)
POTASSIUM: 3.8 mmol/L (ref 3.5–5.1)
Sodium: 143 mmol/L (ref 135–145)

## 2016-02-08 LAB — CBC
HEMATOCRIT: 37.9 % (ref 36.0–46.0)
Hemoglobin: 11.7 g/dL — ABNORMAL LOW (ref 12.0–15.0)
MCH: 29 pg (ref 26.0–34.0)
MCHC: 30.9 g/dL (ref 30.0–36.0)
MCV: 93.8 fL (ref 78.0–100.0)
Platelets: 266 10*3/uL (ref 150–400)
RBC: 4.04 MIL/uL (ref 3.87–5.11)
RDW: 15.6 % — ABNORMAL HIGH (ref 11.5–15.5)
WBC: 20.3 10*3/uL — AB (ref 4.0–10.5)

## 2016-02-08 LAB — GLUCOSE, CAPILLARY
GLUCOSE-CAPILLARY: 129 mg/dL — AB (ref 65–99)
GLUCOSE-CAPILLARY: 178 mg/dL — AB (ref 65–99)
GLUCOSE-CAPILLARY: 189 mg/dL — AB (ref 65–99)
Glucose-Capillary: 340 mg/dL — ABNORMAL HIGH (ref 65–99)
Glucose-Capillary: 193 mg/dL — ABNORMAL HIGH (ref 65–99)

## 2016-02-08 LAB — MAGNESIUM: Magnesium: 2.5 mg/dL — ABNORMAL HIGH (ref 1.7–2.4)

## 2016-02-08 LAB — HEMOGLOBIN A1C
HEMOGLOBIN A1C: 7 % — AB (ref 4.8–5.6)
MEAN PLASMA GLUCOSE: 154 mg/dL

## 2016-02-08 NOTE — Progress Notes (Signed)
PROGRESS NOTE    Christina Solis  [id number] DOB: 02-01-1951 DOA: 02/01/2016 PCP: Charlett Blake, MD  Brief Narrative: Christina Solis is a 65 y.o. female with medical history significant for peripheral artery disease, insulin dependent diabetes mellitus, hypertension, GERD, fibromyalgia, and COPD who presents to the emergency department with chest discomfort, dyspnea, cough, and wheeze. Patient reports that she had been in her usual state of health until 01/29/2016 when she began to experience exertional dyspnea and worsening in her chronic cough. Found to have CAP and COPD exacerbation, improving.   Assessment & Plan:  1. CAP with COPD exacerbation - improving - has been on IV Rocephin/azithromycin, changed to PO levaquin last dose tomorrow. - Currently on prednisone and most likely cause of leukocytosis. Still not at baseline - nebs scheduled and PRN - Blood Cx-negative   2. ?Atrial tachycardia -Now in NSR, rate controlled -Initially there was a question of atrial flutter and was started Cardizem and Eliquis per Dr.Ganji, Per Cards, arrhythmias doesn't appear to be aflutter or afib and hence Eliquis stopped -she is also on ASA and plavix for PAD, aspirin held -ECHO with normal EF and grade 2DD  3. Wide complex tachycardia and LBBB -one episode of NSVT -resolved  4. CKD 3 -stable  5. HTN-uncontrolled -resumed lisinopril , add scheduled hydralazine and IV lasix today  6. Lung nodule -needs FU as outpatient.  7. Hypokalemia - Replace and reassess  DVT prophylaxis:Lovenox  Code Status:Full Code Family Communication:None at bedside Disposition Plan: Most likely d/c next am.   Consultants:  Cards Dr.Ganji   Subjective: Pt reports that her breathing is still not at baseline and is still short of breath with activity  Objective: Vitals:   02/07/16 1517 02/07/16 2148 02/08/16 0520 02/08/16 0819  BP: 132/61 133/65 127/66   Pulse: 71 61 63   Resp:  18 18     Temp: 98 F (36.7 C) 98.1 F (36.7 C) 98.1 F (36.7 C)   TempSrc: Oral Oral Oral   SpO2: 99% 99% 100% 97%  Weight:      Height:        Intake/Output Summary (Last 24 hours) at 02/08/16 1358 Last data filed at 02/08/16 1235  Gross per 24 hour  Intake             1080 ml  Output             1850 ml  Net             -770 ml   Filed Weights   02/01/16 1649 02/02/16 1555 02/04/16 1759  Weight: 103.9 kg (229 lb) 105.7 kg (233 lb) 105.9 kg (233 lb 7.5 oz)    Examination:  General exam: Appears calm and comfortable, AAOx3 Respiratory system: equal chest rise, poor inspiratory effort Cardiovascular system: S1 & S2 heard, RRR. No JVD, murmurs, rubs, gallops or clicks. No pedal edema. Gastrointestinal system: Abdomen is nondistended, soft and nontender. No organomegaly or masses felt. Normal bowel sounds heard. Central nervous system: Alert and oriented. No focal neurological deficits. Extremities: Symmetric 5 x 5 power. Skin: No rashes, lesions or ulcers Psychiatry: Judgement and insight appear normal. Mood & affect appropriate.   Data Reviewed: I have personally reviewed following labs and imaging studies  CBC:  Recent Labs Lab 02/02/16 0429 02/03/16 0107 02/05/16 0552 02/06/16 0505 02/08/16 0416  WBC 14.1* 17.8* 15.5* 15.7* 20.3*  NEUTROABS 13.0* 15.9*  --   --   --   HGB 11.2* 10.7* 12.2  12.3 11.7*  HCT 36.0 34.5* 38.2 38.7 37.9  MCV 94.2 93.8 91.8 91.9 93.8  PLT 243 258 277 273 [id number]   Basic Metabolic Panel:  Recent Labs Lab 02/03/16 0111 02/05/16 0552 02/06/16 0505 02/07/16 0659 02/08/16 0416  NA 139 136 138 139 143  K 4.1 3.5 4.5 3.3* 3.8  CL 109 103 100* 102 105  CO2 23 25 28 28 28   GLUCOSE 255* 376* 222* 88 78  BUN 18 29* 38* 34* 31*  CREATININE 1.19* 1.05* 1.27* 1.23* 1.17*  CALCIUM 9.4 9.5 9.4 8.9 9.2  MG  --   --   --  2.3 2.5*   GFR: Estimated Creatinine Clearance: 56.3 mL/min (by C-G formula based on SCr of 1.17 mg/dL (H)). Liver Function  Tests: No results for input(s): AST, ALT, ALKPHOS, BILITOT, PROT, ALBUMIN in the last 168 hours. No results for input(s): LIPASE, AMYLASE in the last 168 hours. No results for input(s): AMMONIA in the last 168 hours. Coagulation Profile: No results for input(s): INR, PROTIME in the last 168 hours. Cardiac Enzymes:  Recent Labs Lab 02/02/16 0429 02/02/16 0849 02/02/16 1413  TROPONINI <0.03 <0.03 <0.03   BNP (last 3 results) No results for input(s): PROBNP in the last 8760 hours. HbA1C:  Recent Labs  02/07/16 0659  HGBA1C 7.0*   CBG:  Recent Labs Lab 02/07/16 1707 02/07/16 2145 02/08/16 0828 02/08/16 1135 02/08/16 1153  GLUCAP 246* 170* 129* 178* 189*   Lipid Profile:  Recent Labs  02/07/16 0659  CHOL 113  HDL 30*  LDLCALC 48  TRIG 174*  CHOLHDL 3.8   Thyroid Function Tests: No results for input(s): TSH, T4TOTAL, FREET4, T3FREE, THYROIDAB in the last 72 hours. Anemia Panel: No results for input(s): VITAMINB12, FOLATE, FERRITIN, TIBC, IRON, RETICCTPCT in the last 72 hours. Urine analysis:    Component Value Date/Time   COLORURINE YELLOW 09/04/2013 0052   APPEARANCEUR CLEAR 09/04/2013 0052   LABSPEC 1.017 09/04/2013 0052   PHURINE 5.0 09/04/2013 0052   GLUCOSEU NEGATIVE 09/04/2013 0052   HGBUR TRACE (A) 09/04/2013 0052   BILIRUBINUR NEGATIVE 09/04/2013 0052   KETONESUR NEGATIVE 09/04/2013 0052   PROTEINUR NEGATIVE 09/04/2013 0052   UROBILINOGEN 1.0 09/04/2013 0052   NITRITE NEGATIVE 09/04/2013 0052   LEUKOCYTESUR NEGATIVE 09/04/2013 0052   Sepsis Labs: @LABRCNTIP (procalcitonin:4,lacticidven:4)  ) Recent Results (from the past 240 hour(s))  Blood culture (routine x 2)     Status: None   Collection Time: 02/01/16 10:27 PM  Result Value Ref Range Status   Specimen Description BLOOD LEFT ARM  Final   Special Requests BOTTLES DRAWN AEROBIC AND ANAEROBIC 5CC  Final   Culture NO GROWTH 5 DAYS  Final   Report Status 02/06/2016 FINAL  Final  Blood  culture (routine x 2)     Status: None   Collection Time: 02/01/16 10:35 PM  Result Value Ref Range Status   Specimen Description BLOOD RIGHT ARM  Final   Special Requests BOTTLES DRAWN AEROBIC AND ANAEROBIC 5CC EACH  Final   Culture NO GROWTH 5 DAYS  Final   Report Status 02/06/2016 FINAL  Final  MRSA PCR Screening     Status: None   Collection Time: 02/02/16  3:55 PM  Result Value Ref Range Status   MRSA by PCR NEGATIVE NEGATIVE Final    Comment:        The GeneXpert MRSA Assay (FDA approved for NASAL specimens only), is one component of a comprehensive MRSA colonization surveillance program. It is not intended  to diagnose MRSA infection nor to guide or monitor treatment for MRSA infections.          Radiology Studies: No results found.      Scheduled Meds: . aspirin  81 mg Oral Daily  . benzonatate  100 mg Oral BID  . clopidogrel  75 mg Oral Daily  . diltiazem  180 mg Oral Daily  . enoxaparin (LOVENOX) injection  40 mg Subcutaneous Q24H  . ezetimibe  10 mg Oral Q supper  . famotidine  40 mg Oral BID  . ferrous sulfate  325 mg Oral Q breakfast  . insulin aspart  0-20 Units Subcutaneous TID WC  . insulin aspart  0-5 Units Subcutaneous QHS  . insulin aspart  10 Units Subcutaneous TID WC  . insulin glargine  35 Units Subcutaneous BID  . lisinopril  20 mg Oral Daily  . methocarbamol  1,000 mg Oral TID AC & HS  . mometasone-formoterol  2 puff Inhalation BID  . predniSONE  50 mg Oral Q breakfast  . pregabalin  200 mg Oral TID  . rosuvastatin  10 mg Oral Daily   Continuous Infusions: . albuterol Stopped (02/02/16 0117)  . albuterol 10 mg/hr (02/02/16 1244)     LOS: 7 days    Time spent: 33min   Velvet Bathe  [facility] Pager [phone]  If 7PM-7AM, please contact night-coverage [email or web address] Password [email or web address] 02/08/2016, 1:58 PM

## 2016-02-09 DIAGNOSIS — J189 Pneumonia, unspecified organism: Secondary | ICD-10-CM

## 2016-02-09 DIAGNOSIS — R911 Solitary pulmonary nodule: Secondary | ICD-10-CM

## 2016-02-09 DIAGNOSIS — J441 Chronic obstructive pulmonary disease with (acute) exacerbation: Secondary | ICD-10-CM

## 2016-02-09 DIAGNOSIS — I471 Supraventricular tachycardia: Secondary | ICD-10-CM

## 2016-02-09 LAB — BASIC METABOLIC PANEL
Anion gap: 5 (ref 5–15)
BUN: 27 mg/dL — ABNORMAL HIGH (ref 6–20)
CALCIUM: 8.7 mg/dL — AB (ref 8.9–10.3)
CHLORIDE: 105 mmol/L (ref 101–111)
CO2: 28 mmol/L (ref 22–32)
CREATININE: 1.02 mg/dL — AB (ref 0.44–1.00)
GFR calc Af Amer: >60 mL/min (ref >60)
GFR calc non Af Amer: 57 mL/min — ABNORMAL LOW (ref >60)
GLUCOSE: 145 mg/dL — AB (ref 65–99)
Potassium: 3.7 mmol/L (ref 3.5–5.1)
Sodium: 138 mmol/L (ref 135–145)

## 2016-02-09 LAB — GLUCOSE, CAPILLARY
GLUCOSE-CAPILLARY: 69 mg/dL (ref 65–99)
GLUCOSE-CAPILLARY: 252 mg/dL — AB (ref 65–99)
Glucose-Capillary: 86 mg/dL (ref 65–99)

## 2016-02-09 LAB — MAGNESIUM: Magnesium: 2.3 mg/dL (ref 1.7–2.4)

## 2016-02-09 MED ORDER — HYDROCOD POLST-CPM POLST ER 10-8 MG/5ML PO SUER: 5.0000 mL | Freq: Two times a day (BID) | ORAL | 0 refills | Status: DC | PRN

## 2016-02-09 MED ORDER — DILTIAZEM HCL ER COATED BEADS 180 MG PO CP24
180.0000 mg | ORAL_CAPSULE | Freq: Every day | ORAL | 0 refills | Status: DC
Start: 2016-02-09 — End: 2020-02-15

## 2016-02-09 MED ORDER — UMECLIDINIUM BROMIDE 62.5 MCG/INH IN AEPB: 1.0000 | INHALATION_SPRAY | Freq: Every day | RESPIRATORY_TRACT | 0 refills | Status: DC

## 2016-02-09 MED ORDER — PREDNISONE 10 MG PO TABS
ORAL_TABLET | ORAL | 0 refills | Status: DC
Start: 2016-02-10 — End: 2016-08-16

## 2016-02-09 NOTE — Progress Notes (Signed)
Patient was discharged home by MD order; discharged instructions  review and give to patient with care notes and prescription; IV DIC; skin intact; patient will be escorted to the car by nurse tech via wheelchair.  

## 2016-02-09 NOTE — Discharge Summary (Addendum)
Christina Solis, is a 65 y.o. female  DOB Sep 24, 1950  MRN [id number].  Admission date:  02/01/2016  Admitting Physician  Vianne Bulls, MD  Discharge Date:  02/09/2016   Primary MD  Charlett Blake, MD  Recommendations for primary care physician for things to follow:    CAP with COPD exacerbation Continue on prednisone taper Start Incruse 1puff qday Cont symbicort Cont albuterol  Leukocytosis ? Secondary to prednisone Check cbc in 1 week  ?Atrial tachycardia In NSR.  Initially there was a question of atrial flutter and was started Cardizem and Eliquis per Dr.Ganji, Per Cards, arrhythmias doesn't appear to be aflutter or afib and hence Eliquis stopped Cont  ASA and plavix for PAD -ECHO with normal EF and grade 2DD  Wide complex tachycardia and LBBB one episode of NSVT -resolved  CKD 3 stable Check cmp in 1 week  HTN Cont cardizem, cont lisinopril /hydrochlorothiazide pcp to follow up on bp  Lung nodule -needs FU as outpatient.  Hypokalemia Check, cmp in 1 week as above  Admission Diagnosis  COPD exacerbation (HCC) [J44.1] CAP (community acquired pneumonia) [J18.9] Chest pain, unspecified chest pain type [R07.9]   Discharge Diagnosis  COPD exacerbation ([place]) [J44.1] CAP (community acquired pneumonia) [J18.9] Chest pain, unspecified chest pain type [R07.9]    Principal Problem:   CAP (community acquired pneumonia) Active Problems:   Insulin dependent diabetes mellitus ([place])   PAD (peripheral artery disease) ([place])   HTN (hypertension)   COPD with acute exacerbation (HCC)   Incidental lung nodule, > 37mm and < 109mm   CKD (chronic kidney disease), stage III   COPD exacerbation (HCC)   Wheezing   Atrial tachycardia (HCC)   Left bundle branch block   Chronic diastolic CHF (congestive heart failure) ([place])   Uncontrolled type 2 diabetes mellitus with complication  ([place])      Past Medical History:  Diagnosis Date  . Anemia    sickle cell trait  . Anxiety   . Arthritis    "knees" (10/12/2013)  . CHF (congestive heart failure) ([place])   . Chronic back pain   . Chronic bronchitis ([place])    "got it q yr when I lived in [place]"  . Complication of anesthesia    hard to put under  . COPD (chronic obstructive pulmonary disease) ([place])   . Dysrhythmia   . Family history of anesthesia complication    sister hard to wake up  . Family history of anesthesia complication    niece have itching  . Fibromyalgia   . GERD (gastroesophageal reflux disease)   . Hyperlipidemia   . Hypertension   . Irregular heart beat   . Migraine    "used to get them alot; not regular anymore" (10/12/2013)  . 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 Pneumonia    "twice"  . Type II diabetes mellitus ([place])     Past Surgical History:  Procedure Laterality Date  . ABDOMINAL AORTAGRAM  N/A 06/28/2012   Procedure: ABDOMINAL Maxcine Ham;  Surgeon: Laverda Page, MD;  Location: [facility] CATH LAB;  Service: Cardiovascular;  Laterality: N/A;  . APPENDECTOMY    . BACK SURGERY    . CARDIAC CATHETERIZATION    . CESAREAN SECTION  1974; 1976; 1978  . CHOLECYSTECTOMY N/A 10/12/2013   Procedure: LAPAROSCOPIC CHOLECYSTECTOMY WITH INTRAOPERATIVE CHOLANGIOGRAM;  Surgeon: [name]. Georgette Dover, MD;  Location: [place];  Service: General;  Laterality: N/A;  . COLONOSCOPY    . FEMORAL ARTERY STENT Left 06/28/2012   SFA  . LAPAROSCOPIC CHOLECYSTECTOMY  10/12/2013  . LEFT HEART CATHETERIZATION WITH CORONARY ANGIOGRAM N/A 06/28/2012   Procedure: LEFT HEART CATHETERIZATION WITH CORONARY ANGIOGRAM;  Surgeon: Laverda Page, MD;  Location: [facility] CATH LAB;  Service: Cardiovascular;  Laterality: N/A;  . LOWER EXTREMITY ANGIOGRAM N/A 06/28/2012   Procedure: LOWER EXTREMITY ANGIOGRAM;  Surgeon: Laverda Page, MD;  Location: [facility] CATH LAB;  Service: Cardiovascular;   Laterality: N/A;  . PERIPHERAL VASCULAR CATHETERIZATION N/A 09/10/2015   Procedure: Abdominal Aortogram w/Lower Extremity;  Surgeon: Adrian Prows, MD;  Location: [place] CV LAB;  Service: Cardiovascular;  Laterality: N/A;  . POSTERIOR LUMBAR FUSION    . TONSILLECTOMY    . TUBAL LIGATION  1978       HPI  from the history and physical done on the day of admission:      65 y.o.femalewith medical history significant for peripheral artery disease, insulin dependent diabetes mellitus, hypertension, GERD, fibromyalgia, and COPD who presents to the emergency department with chest discomfort, dyspnea, cough, and wheeze. Patient reports that she had been in her usual state of health until 01/29/2016 when she began to experience exertional dyspnea and worsening in her chronic cough. Found to have CAP and COPD exacerbation    Hospital Course:      Pt was treated with iv solumedrol along with rocephin, and zithromax, and then transitioned to po levaquin. Blood culture negative.   Pt completed  levaquin po course. Feeling much better.  During her hsopital course pt has ultasound r/o DVT => negative on 9/19 , pt had evaluation by cardiology for ? Afib, which was negative. No afib detected.  Echo 9/18=> EF nl.   Started on cardizem for rate control.  Will need monitoring of bp and hr upon discharge by pcp.  Pt appears stable this am and will be discharge home.   Follow UP  Follow-up Information    Charlett Blake, MD Follow up in 1 week(s).   Specialty:  Physical Medicine and Rehabilitation Contact information: [place] [address] [phone]            Consults obtained -  Adrian Prows  Discharge Condition: stable  Diet and Activity recommendation: See Discharge Instructions below  Discharge Instructions         Discharge Medications       Medication List    STOP taking these medications   tiZANidine 4 MG tablet Commonly known as:   ZANAFLEX     TAKE these medications   aspirin EC 81 MG tablet Take 81 mg by mouth daily.   budesonide-formoterol 160-4.5 MCG/ACT inhaler Commonly known as:  SYMBICORT Inhale 2 puffs into the lungs 2 (two) times daily.   chlorpheniramine-HYDROcodone 10-8 MG/5ML Suer Commonly known as:  TUSSIONEX Take 5 mLs by mouth every 12 (twelve) hours as needed for cough.   clopidogrel 75 MG tablet Commonly known as:  PLAVIX Take 75 mg by mouth daily.  dicyclomine 20 MG tablet Commonly known as:  BENTYL Take 20 mg by mouth 4 (four) times daily as needed for spasms.   diltiazem 180 MG 24 hr capsule Commonly known as:  CARDIZEM CD Take 1 capsule (180 mg total) by mouth daily.   ezetimibe 10 MG tablet Commonly known as:  ZETIA Take 10 mg by mouth daily with supper.   famotidine 40 MG tablet Commonly known as:  PEPCID Take 40 mg by mouth 2 (two) times daily.   ferrous sulfate 325 (65 FE) MG tablet Take 325 mg by mouth daily with breakfast.   HUMALOG MIX 75/25 KWIKPEN (75-25) 100 UNIT/ML Kwikpen Generic drug:  Insulin Lispro Prot & Lispro Inject 22 Units into the skin 2 (two) times daily with a meal.   lisinopril-hydrochlorothiazide 20-12.5 MG tablet Commonly known as:  PRINZIDE,ZESTORETIC Take 1 tablet by mouth daily.   meloxicam 15 MG tablet Commonly known as:  MOBIC TAKE 1 TABLET BY MOUTH DAILY   methocarbamol 500 MG tablet Commonly known as:  ROBAXIN Take 1,000 mg by mouth 4 (four) times daily.   MUSCLE RUB 10-15 % Crea Apply 1 application topically as needed for muscle pain.   onetouch ultrasoft lancets   ONETOUCH VERIO test strip Generic drug:  glucose blood   polyethylene glycol powder powder Commonly known as:  GLYCOLAX/MIRALAX Take 17 g by mouth daily as needed for mild constipation.   predniSONE 10 MG tablet Commonly known as:  DELTASONE 40mg  po qday x2days then 30mg  po qday x 2 days then 20mg  po qday x 2 days then 10mg  po qday x 2 days Start taking on:   02/10/2016   pregabalin 200 MG capsule Commonly known as:  LYRICA Take 200 mg by mouth 3 (three) times daily.   PROAIR HFA 108 (90 Base) MCG/ACT inhaler Generic drug:  albuterol Inhale 2 puffs into the lungs every 6 (six) hours as needed for wheezing or shortness of breath. ProAir   albuterol (2.5 MG/3ML) 0.083% nebulizer solution Commonly known as:  PROVENTIL Take 2.5 mg by nebulization every 6 (six) hours as needed for wheezing or shortness of breath.   promethazine 25 MG tablet Commonly known as:  PHENERGAN Take 25 mg by mouth every 6 (six) hours as needed for nausea or vomiting.   rosuvastatin 10 MG tablet Commonly known as:  CRESTOR Take 10 mg by mouth daily.   traMADol 50 MG tablet Commonly known as:  ULTRAM Take 100 mg by mouth 2 (two) times daily as needed for moderate pain.   umeclidinium bromide 62.5 MCG/INH Aepb Commonly known as:  INCRUSE ELLIPTA Inhale 1 puff into the lungs daily.   Vitamin D (Ergocalciferol) 50000 units Caps capsule Commonly known as:  DRISDOL Take 50,000 Units by mouth every Monday.       Major procedures and Radiology Reports - PLEASE review detailed and final reports for all details, in brief -      Ct Angio Chest Pe W And/or Wo Contrast  Result Date: 02/01/2016 CLINICAL DATA:  Chest pain right-sided and midline radiating to back 2 days. Shortness of breath. EXAM: CT ANGIOGRAPHY CHEST WITH CONTRAST TECHNIQUE: Multidetector CT imaging of the chest was performed using the standard protocol during bolus administration of intravenous contrast. Multiplanar CT image reconstructions and MIPs were obtained to evaluate the vascular anatomy. CONTRAST:  Two separate boluses for total of 130 mL Isovue 370. COMPARISON:  None. FINDINGS: Cardiovascular: Mild cardiomegaly. Mild calcified plaque over the thoracic aorta. Pulmonary arterial system is within normal  without emboli. Mediastinum/Nodes: No significant mediastinal or hilar adenopathy. Remaining  mediastinal structures are within normal. Lungs/Pleura: Lungs are adequately inflated demonstrate a patchy bilateral predominantly peripheral airspace process. No evidence of effusion. More discrete 5 mm nodule over the posterior right lower lobe. Airways are within normal. Upper Abdomen: Previous cholecystectomy. Musculoskeletal: Within normal. Review of the MIP images confirms the above findings. IMPRESSION: Patchy bilateral predominantly peripheral airspace process. Findings may be due to multifocal pneumonia. Also consider chronic eosinophilic pneumonia. Recommend followup chest CT 4 weeks. No evidence of pulmonary embolism. 5 mm nodule right lower lobe. Recommend followup CT 1 year. This recommendation follows the consensus statement: Guidelines for Management of Small Pulmonary Nodules Detected on CT Scans: A Statement from the [place] as published in Radiology 2005; 237:395-400. Online at: https://www.arnold.com/. Mild cardiomegaly.  Aortic atherosclerosis. Electronically Signed   By: Marin Olp M.D.   On: 02/01/2016 19:58   Dg Chest Port 1 View  Result Date: 02/05/2016 CLINICAL DATA:  Acute onset of wheezing.  Initial encounter. EXAM: PORTABLE CHEST 1 VIEW COMPARISON:  Chest radiograph and CTA of the chest performed 02/01/2016 FINDINGS: The lungs are well-aerated. Mild vascular congestion is noted. Increased interstitial markings may reflect mild interstitial edema. There is no evidence of pleural effusion or pneumothorax. The cardiomediastinal silhouette is mildly enlarged. No acute osseous abnormalities are seen. IMPRESSION: Mild vascular congestion and mild cardiomegaly. Increased interstitial markings may reflect mild interstitial edema. Electronically Signed   By: Garald Balding M.D.   On: 02/05/2016 01:52   Dg Chest Portable 1 View  Result Date: 02/01/2016 CLINICAL DATA:  Shortness of breath with chest pain, sore throat and cough 3 days. EXAM:  PORTABLE CHEST 1 VIEW COMPARISON:  03/26/2015 FINDINGS: Lungs are adequately inflated without focal consolidation or effusion. There is mild prominence of the perihilar markings. Mild stable cardiomegaly. Calcified plaque over the aortic arch. Remainder of the exam is unchanged. IMPRESSION: Mild cardiomegaly with suggestion minimal vascular congestion. Aortic atherosclerosis. Electronically Signed   By: Marin Olp M.D.   On: 02/01/2016 18:13    Micro Results     Recent Results (from the past 240 hour(s))  Blood culture (routine x 2)     Status: None   Collection Time: 02/01/16 10:27 PM  Result Value Ref Range Status   Specimen Description BLOOD LEFT ARM  Final   Special Requests BOTTLES DRAWN AEROBIC AND ANAEROBIC 5CC  Final   Culture NO GROWTH 5 DAYS  Final   Report Status 02/06/2016 FINAL  Final  Blood culture (routine x 2)     Status: None   Collection Time: 02/01/16 10:35 PM  Result Value Ref Range Status   Specimen Description BLOOD RIGHT ARM  Final   Special Requests BOTTLES DRAWN AEROBIC AND ANAEROBIC 5CC EACH  Final   Culture NO GROWTH 5 DAYS  Final   Report Status 02/06/2016 FINAL  Final  MRSA PCR Screening     Status: None   Collection Time: 02/02/16  3:55 PM  Result Value Ref Range Status   MRSA by PCR NEGATIVE NEGATIVE Final    Comment:        The GeneXpert MRSA Assay (FDA approved for NASAL specimens only), is one component of a comprehensive MRSA colonization surveillance program. It is not intended to diagnose MRSA infection nor to guide or monitor treatment for MRSA infections.        Today   Subjective    Daryel Gerald today been afebrile.  Denies cough, cp, palp,  sob, wheezing, feeling much better.   Pt has no headache,no  abdominal pain,no new weakness tingling or numbness, feels much better wants to go home today.    Objective   Blood pressure (!) 108/56, pulse 68, temperature 98.2 F (36.8 C), temperature source Oral, resp. rate 18, height  5\' 2"  (1.575 m), weight 105.9 kg (233 lb 7.5 oz), SpO2 99 %.   Intake/Output Summary (Last 24 hours) at 02/09/16 1050 Last data filed at 02/09/16 1003  Gross per 24 hour  Intake              950 ml  Output             3175 ml  Net            -2225 ml    Exam Awake Alert, Oriented x 3, No new F.N deficits, Normal affect [place].AT,PERRAL Supple Neck,No JVD, No cervical lymphadenopathy appriciated.  Symmetrical Chest wall movement, Good air movement bilaterally, CTAB RRR,No Gallops,Rubs or new Murmurs, No Parasternal Heave +ve B.Sounds, Abd Soft, Non tender, No organomegaly appriciated, No rebound -guarding or rigidity. No Cyanosis, Clubbing or edema, No new Rash or bruise   Data Review   CBC w Diff:  Lab Results  Component Value Date   WBC 20.3 (H) 02/08/2016   HGB 11.7 (L) 02/08/2016   HCT 37.9 02/08/2016   PLT 266 02/08/2016   LYMPHOPCT 7 02/03/2016   MONOPCT 4 02/03/2016   EOSPCT 0 02/03/2016   BASOPCT 0 02/03/2016    CMP:  Lab Results  Component Value Date   NA 138 02/09/2016   K 3.7 02/09/2016   CL 105 02/09/2016   CO2 28 02/09/2016   BUN 27 (H) 02/09/2016   CREATININE 1.02 (H) 02/09/2016   PROT 7.8 09/03/2013   ALBUMIN 3.3 (L) 09/03/2013   BILITOT 0.2 (L) 09/03/2013   ALKPHOS 64 09/03/2013   AST 11 09/03/2013   ALT 7 09/03/2013  .   Total Time in preparing paper work, data evaluation and todays exam - 18 minutes  Jani Gravel M.D on 02/09/2016 at 10:50 AM  [facility]   Office  [phone]

## 2016-02-09 NOTE — Progress Notes (Signed)
This AM patient's CBG was 69. Patient was alert and oriented, no complaints or acute distress. Patient had a cup with orange juice and her CBG went up to 86. She will eat her breakfast. Will continue to monitor.

## 2016-02-28 ENCOUNTER — Other Ambulatory Visit: Payer: Self-pay | Admitting: Nurse Practitioner

## 2016-02-28 DIAGNOSIS — J189 Pneumonia, unspecified organism: Secondary | ICD-10-CM

## 2016-02-28 DIAGNOSIS — Z1231 Encounter for screening mammogram for malignant neoplasm of breast: Secondary | ICD-10-CM

## 2016-03-03 ENCOUNTER — Other Ambulatory Visit: Payer: Medicare Other

## 2016-03-03 ENCOUNTER — Ambulatory Visit: Payer: Medicare Other

## 2016-03-31 ENCOUNTER — Ambulatory Visit: Payer: Medicare Other

## 2016-08-16 ENCOUNTER — Emergency Department (HOSPITAL_COMMUNITY): Payer: Medicare Other

## 2016-08-16 ENCOUNTER — Encounter (HOSPITAL_COMMUNITY): Payer: Self-pay | Admitting: Emergency Medicine

## 2016-08-16 ENCOUNTER — Emergency Department (HOSPITAL_COMMUNITY)
Admission: EM | Admit: 2016-08-16 | Discharge: 2016-08-16 | Disposition: A | Discharge status: Home / Self Care | Payer: Medicare Other | Attending: Emergency Medicine | Admitting: Emergency Medicine

## 2016-08-16 DIAGNOSIS — I110 Hypertensive heart disease with heart failure: Secondary | ICD-10-CM | POA: Insufficient documentation

## 2016-08-16 DIAGNOSIS — I5032 Chronic diastolic (congestive) heart failure: Secondary | ICD-10-CM | POA: Diagnosis not present

## 2016-08-16 DIAGNOSIS — E1140 Type 2 diabetes mellitus with diabetic neuropathy, unspecified: Secondary | ICD-10-CM | POA: Diagnosis not present

## 2016-08-16 DIAGNOSIS — Z79899 Other long term (current) drug therapy: Secondary | ICD-10-CM | POA: Insufficient documentation

## 2016-08-16 DIAGNOSIS — Z794 Long term (current) use of insulin: Secondary | ICD-10-CM | POA: Insufficient documentation

## 2016-08-16 DIAGNOSIS — M25551 Pain in right hip: Secondary | ICD-10-CM | POA: Diagnosis present

## 2016-08-16 DIAGNOSIS — Z87891 Personal history of nicotine dependence: Secondary | ICD-10-CM | POA: Insufficient documentation

## 2016-08-16 DIAGNOSIS — M5416 Radiculopathy, lumbar region: Secondary | ICD-10-CM | POA: Insufficient documentation

## 2016-08-16 DIAGNOSIS — Z7982 Long term (current) use of aspirin: Secondary | ICD-10-CM | POA: Insufficient documentation

## 2016-08-16 DIAGNOSIS — M8538 Osteitis condensans, other site: Secondary | ICD-10-CM | POA: Insufficient documentation

## 2016-08-16 DIAGNOSIS — J449 Chronic obstructive pulmonary disease, unspecified: Secondary | ICD-10-CM | POA: Insufficient documentation

## 2016-08-16 DIAGNOSIS — M869 Osteomyelitis, unspecified: Secondary | ICD-10-CM

## 2016-08-16 LAB — COMPREHENSIVE METABOLIC PANEL
ALK PHOS: 67 U/L (ref 38–126)
ALT: 13 U/L — AB (ref 14–54)
AST: 17 U/L (ref 15–41)
Albumin: 3.7 g/dL (ref 3.5–5.0)
Anion gap: 9 (ref 5–15)
BILIRUBIN TOTAL: 0.3 mg/dL (ref 0.3–1.2)
BUN: 23 mg/dL — ABNORMAL HIGH (ref 6–20)
CALCIUM: 9.4 mg/dL (ref 8.9–10.3)
CO2: 26 mmol/L (ref 22–32)
CREATININE: 1.14 mg/dL — AB (ref 0.44–1.00)
Chloride: 107 mmol/L (ref 101–111)
GFR, EST AFRICAN AMERICAN: 58 mL/min — AB (ref >60)
GFR, EST NON AFRICAN AMERICAN: 50 mL/min — AB (ref >60)
Glucose, Bld: 81 mg/dL (ref 65–99)
Potassium: 3.8 mmol/L (ref 3.5–5.1)
Sodium: 142 mmol/L (ref 135–145)
Total Protein: 6.9 g/dL (ref 6.5–8.1)

## 2016-08-16 LAB — URINALYSIS, ROUTINE W REFLEX MICROSCOPIC
BACTERIA UA: NONE SEEN
Bilirubin Urine: NEGATIVE
Glucose, UA: NEGATIVE mg/dL
Ketones, ur: NEGATIVE mg/dL
Leukocytes, UA: NEGATIVE
NITRITE: NEGATIVE
Protein, ur: NEGATIVE mg/dL
SPECIFIC GRAVITY, URINE: 1.015 (ref 1.005–1.030)
pH: 5 (ref 5.0–8.0)

## 2016-08-16 LAB — CBC WITH DIFFERENTIAL/PLATELET
Basophils Absolute: 0 10*3/uL (ref 0.0–0.1)
Basophils Relative: 1 %
Eosinophils Absolute: 0.1 10*3/uL (ref 0.0–0.7)
Eosinophils Relative: 1 %
HEMATOCRIT: 34.4 % — AB (ref 36.0–46.0)
HEMOGLOBIN: 10.9 g/dL — AB (ref 12.0–15.0)
LYMPHS ABS: 2.8 10*3/uL (ref 0.7–4.0)
LYMPHS PCT: 33 %
MCH: 29.4 pg (ref 26.0–34.0)
MCHC: 31.7 g/dL (ref 30.0–36.0)
MCV: 92.7 fL (ref 78.0–100.0)
Monocytes Absolute: 0.4 10*3/uL (ref 0.1–1.0)
Monocytes Relative: 5 %
NEUTROS PCT: 60 %
Neutro Abs: 5.1 10*3/uL (ref 1.7–7.7)
Platelets: 220 10*3/uL (ref 150–400)
RBC: 3.71 MIL/uL — AB (ref 3.87–5.11)
RDW: 15.1 % (ref 11.5–15.5)
WBC: 8.4 10*3/uL (ref 4.0–10.5)

## 2016-08-16 LAB — CBG MONITORING, ED
GLUCOSE-CAPILLARY: 82 mg/dL (ref 65–99)
Glucose-Capillary: 103 mg/dL — ABNORMAL HIGH (ref 65–99)

## 2016-08-16 LAB — SEDIMENTATION RATE: SED RATE: 63 mm/h — AB (ref 0–22)

## 2016-08-16 MED ORDER — LORAZEPAM 2 MG/ML IJ SOLN
0.5000 mg | Freq: Once | INTRAMUSCULAR | Status: AC
  Administered 2016-08-16: 0.5 mg via INTRAVENOUS

## 2016-08-16 MED ORDER — LORAZEPAM 1 MG PO TABS
1.0000 mg | ORAL_TABLET | Freq: Once | ORAL | Status: AC
  Administered 2016-08-16: 1 mg via ORAL
  Filled 2016-08-16: qty 1

## 2016-08-16 MED ORDER — METHOCARBAMOL 500 MG PO TABS
500.0000 mg | ORAL_TABLET | Freq: Once | ORAL | Status: AC
  Administered 2016-08-16: 500 mg via ORAL
  Filled 2016-08-16: qty 1

## 2016-08-16 MED ORDER — LORAZEPAM 2 MG/ML IJ SOLN
0.5000 mg | Freq: Once | INTRAMUSCULAR | Status: AC
  Administered 2016-08-16: 0.5 mg via INTRAVENOUS
  Filled 2016-08-16: qty 1

## 2016-08-16 MED ORDER — PREDNISONE 20 MG PO TABS: ORAL_TABLET | ORAL | 0 refills | Status: DC

## 2016-08-16 MED ORDER — TRAMADOL HCL 50 MG PO TABS: 50.0000 mg | ORAL_TABLET | Freq: Four times a day (QID) | ORAL | 0 refills | Status: DC | PRN

## 2016-08-16 MED ORDER — KETOROLAC TROMETHAMINE 60 MG/2ML IM SOLN
60.0000 mg | Freq: Once | INTRAMUSCULAR | Status: AC
  Administered 2016-08-16: 60 mg via INTRAMUSCULAR
  Filled 2016-08-16: qty 2

## 2016-08-16 NOTE — ED Notes (Signed)
Pt given Salli Real, per Santiago Glad, Therapist, sports.

## 2016-08-16 NOTE — ED Triage Notes (Addendum)
Pt to ED via private vehicle -- c/o right hip pain and cramping, pelvis pain, low back pain. Has been taking home meds, OTC meds, and heating pad- without relief. Weather change sometimes affects pt's fibromyalgia -- pt states this does not feel like fibromyalgia.   Pt has had 2 surgeries on back in [place].

## 2016-08-16 NOTE — ED Notes (Signed)
To MRI

## 2016-08-16 NOTE — ED Notes (Signed)
Pt c/o being hungry, feels like sugar is dropping. CBG 82-- meds given with orange juice.

## 2016-08-16 NOTE — ED Provider Notes (Signed)
[place] DEPT Provider Note   CSN: [id number] Arrival date & time: 08/16/16  0734     History   Chief Complaint Chief Complaint  Patient presents with  . Sciatica  . Hip Pain    HPI Christina Solis is a 66 y.o. female.  HPI Patient with history of arthritis and chronic back pain presents with bilateral hip pain. She states that the pain has been continuous for the last few days. She admits to having the pain for the past 10 years. She states she is normally able to control the pain with heat and muscle relaxants. Pain radiates down both legs. Pain does not radiate past the left knee and the left leg but goes past the right knee and the right leg. Occasional paresthesias in bilateral feet but she attributed this to her peripheral neuropathy. She has no new focal weakness. She has ongoing low back pain which is unchanged. Denies urinary hesitancy, dysuria, hematuria. Has ongoing urinary frequency. No fever or chills. No recent falls or other trauma. No new lower extremity swelling. Past Medical History:  Diagnosis Date  . Anemia    sickle cell trait  . Anxiety   . Arthritis    "knees" (10/12/2013)  . CHF (congestive heart failure) ([place])   . Chronic back pain   . Chronic bronchitis ([place])    "got it q yr when I lived in [place]"  . Complication of anesthesia    hard to put under  . COPD (chronic obstructive pulmonary disease) ([place])   . Dysrhythmia   . Family history of anesthesia complication    sister hard to wake up  . Family history of anesthesia complication    niece have itching  . Fibromyalgia   . GERD (gastroesophageal reflux disease)   . Hyperlipidemia   . Hypertension   . Irregular heart beat   . Migraine    "used to get them alot; not regular anymore" (10/12/2013)  . 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 Pneumonia    "twice"  . Type II diabetes mellitus [facility])     Patient Active Problem List   Diagnosis Date Noted    . COPD exacerbation ([place])   . Wheezing   . Atrial tachycardia ([place])   . Left bundle branch block   . Chronic diastolic CHF (congestive heart failure) ([place])   . Uncontrolled type 2 diabetes mellitus with complication ([place])   . COPD with acute exacerbation ([place]) 02/02/2016  . CAP (community acquired pneumonia) 02/02/2016  . Incidental lung nodule, > 62mm and < 22mm 02/02/2016  . CKD (chronic kidney disease), stage III 02/02/2016  . Pain in the chest   . Acute respiratory failure ([place]) 02/01/2016  . Anemia, unspecified 04/08/2015  . Hemorrhoids 04/08/2015  . Osteoarthritis of right knee 04/05/2015  . HTN (hypertension) 04/05/2015  . Neuropathy in diabetes ([place]) 04/05/2015  . Hyperlipidemia 04/05/2015  . Pain and swelling of left lower leg 04/05/2015  . Chronic cholecystitis with calculus 09/15/2013  . Insulin dependent diabetes mellitus ([place]) 06/28/2012  . PAD (peripheral artery disease) ([place]) 06/28/2012  . Fibromyalgia 06/20/2012  . Shoulder pain 06/20/2012  . Lumbar postlaminectomy syndrome 06/20/2012    Past Surgical History:  Procedure Laterality Date  . ABDOMINAL AORTAGRAM N/A 06/28/2012   Procedure: ABDOMINAL Maxcine Ham;  Surgeon: Laverda Page, MD;  Location: [facility] CATH LAB;  Service: Cardiovascular;  Laterality: N/A;  . APPENDECTOMY    . BACK SURGERY    .  CARDIAC CATHETERIZATION    . CESAREAN SECTION  1974; 1976; 1978  . CHOLECYSTECTOMY N/A 10/12/2013   Procedure: LAPAROSCOPIC CHOLECYSTECTOMY WITH INTRAOPERATIVE CHOLANGIOGRAM;  Surgeon: [name]. Georgette Dover, MD;  Location: [facility];  Service: General;  Laterality: N/A;  . COLONOSCOPY    . FEMORAL ARTERY STENT Left 06/28/2012   SFA  . LAPAROSCOPIC CHOLECYSTECTOMY  10/12/2013  . LEFT HEART CATHETERIZATION WITH CORONARY ANGIOGRAM N/A 06/28/2012   Procedure: LEFT HEART CATHETERIZATION WITH CORONARY ANGIOGRAM;  Surgeon: Laverda Page, MD;  Location[facility] CATH LAB;  Service: Cardiovascular;  Laterality: N/A;  . LOWER EXTREMITY  ANGIOGRAM N/A 06/28/2012   Procedure: LOWER EXTREMITY ANGIOGRAM;  Surgeon: Laverda Page, MD;  Location: [facility] CATH LAB;  Service: Cardiovascular;  Laterality: N/A;  . PERIPHERAL VASCULAR CATHETERIZATION N/A 09/10/2015   Procedure: Abdominal Aortogram w/Lower Extremity;  Surgeon: Adrian Prows, MD;  Location: [facility] CV LAB;  Service: Cardiovascular;  Laterality: N/A;  . POSTERIOR LUMBAR FUSION    . TONSILLECTOMY    . TUBAL LIGATION  1978    OB History    No data available       Home Medications    Prior to Admission medications   Medication Sig Start Date End Date Taking? Authorizing Provider  albuterol (PROAIR HFA) 108 (90 BASE) MCG/ACT inhaler Inhale 1 puff into the lungs every 6 (six) hours as needed for wheezing or shortness of breath. ProAir    Yes Historical Provider, MD  albuterol (PROVENTIL) (2.5 MG/3ML) 0.083% nebulizer solution Take 2.5 mg by nebulization every 6 (six) hours as needed for wheezing or shortness of breath.  06/30/15  Yes Historical Provider, MD  aspirin EC 81 MG tablet Take 81 mg by mouth daily.    Yes Historical Provider, MD  budesonide-formoterol (SYMBICORT) 160-4.5 MCG/ACT inhaler Inhale 2 puffs into the lungs 2 (two) times daily.   Yes Historical Provider, MD  clopidogrel (PLAVIX) 75 MG tablet Take 75 mg by mouth daily.  08/15/15  Yes Historical Provider, MD  dicyclomine (BENTYL) 20 MG tablet Take 20 mg by mouth 4 (four) times daily as needed for spasms.  09/06/13  Yes Historical Provider, MD  diltiazem (CARDIZEM CD) 180 MG 24 hr capsule Take 1 capsule (180 mg total) by mouth daily. 02/09/16  Yes Jani Gravel, MD  ezetimibe (ZETIA) 10 MG tablet Take 10 mg by mouth daily with supper.   Yes Historical Provider, MD  famotidine (PEPCID) 40 MG tablet Take 40 mg by mouth 2 (two) times daily.   Yes Historical Provider, MD  ferrous sulfate 325 (65 FE) MG tablet Take 325 mg by mouth daily with breakfast.  07/30/15  Yes Historical Provider, MD  HUMALOG MIX 75/25 KWIKPEN  (75-25) 100 UNIT/ML Kwikpen Inject 22 Units into the skin 2 (two) times daily with a meal.  08/08/15  Yes Historical Provider, MD  lisinopril-hydrochlorothiazide (PRINZIDE,ZESTORETIC) 20-12.5 MG per tablet Take 1 tablet by mouth daily.    Yes Historical Provider, MD  meloxicam (MOBIC) 15 MG tablet TAKE 1 TABLET BY MOUTH DAILY 09/26/14  Yes Historical Provider, MD  Menthol-Methyl Salicylate (MUSCLE RUB) 10-15 % CREA Apply 1 application topically as needed for muscle pain.   Yes Historical Provider, MD  pregabalin (LYRICA) 200 MG capsule Take 200 mg by mouth 3 (three) times daily.   Yes Historical Provider, MD  promethazine (PHENERGAN) 25 MG tablet Take 25 mg by mouth every 6 (six) hours as needed for nausea or vomiting.  07/30/15  Yes Historical Provider, MD  rosuvastatin (CRESTOR) 10  MG tablet Take 10 mg by mouth at bedtime.  09/02/15  Yes Historical Provider, MD  tiZANidine (ZANAFLEX) 4 MG tablet Take 4 mg by mouth every 6 (six) hours. 07/24/16  Yes Historical Provider, MD  Vitamin D, Ergocalciferol, (DRISDOL) 50000 UNITS CAPS capsule Take 50,000 Units by mouth every Tuesday.    Yes Historical Provider, MD  chlorpheniramine-HYDROcodone (TUSSIONEX) 10-8 MG/5ML SUER Take 5 mLs by mouth every 12 (twelve) hours as needed for cough. Patient not taking: Reported on 08/16/2016 02/09/16   Jani Gravel, MD  predniSONE (DELTASONE) 20 MG tablet 3 tabs po day one, then 2 tabs daily x 4 days 08/16/16   Julianne Rice, MD  traMADol (ULTRAM) 50 MG tablet Take 1 tablet (50 mg total) by mouth every 6 (six) hours as needed for moderate pain. 08/16/16   Julianne Rice, MD  umeclidinium bromide (INCRUSE ELLIPTA) 62.5 MCG/INH AEPB Inhale 1 puff into the lungs daily. Patient not taking: Reported on 08/16/2016 02/09/16   Jani Gravel, MD    Family History Family History  Problem Relation Age of Onset  . Diabetes Mother   . Cancer Father   . Kidney disease Brother     Social History Social History  Substance Use Topics  . Smoking  status: Former Smoker    Packs/day: 0.50    Years: 46.00    Types: Cigarettes    Start date: 06/20/2000  . Smokeless tobacco: Never Used  . 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  Respiratory: Negative for cough and shortness of breath.   Cardiovascular: Negative for chest pain, palpitations and leg swelling.  Gastrointestinal: Negative for abdominal pain, constipation, diarrhea, nausea and vomiting.  Genitourinary: Positive for frequency. Negative for dysuria, flank pain and hematuria.  Musculoskeletal: Positive for arthralgias, back pain and myalgias. Negative for joint swelling, neck pain and neck stiffness.  Skin: Negative for rash and wound.  Neurological: Negative for dizziness, weakness, light-headedness, numbness and headaches.  All other systems reviewed and are negative.    Physical Exam Updated Vital Signs BP 112/75 (BP Location: Right Arm)   Pulse 72   Temp 98.4 F (36.9 C) (Oral)   Resp 16   Ht 5\' 2"  (1.575 m)   Wt 221 lb (100.2 kg)   SpO2 100%   BMI 40.42 kg/m   Physical Exam  Constitutional: She is oriented to person, place, and time. She appears well-developed and well-nourished. No distress.  HENT:  Head: Normocephalic and atraumatic.  Mouth/Throat: Oropharynx is clear and moist. No oropharyngeal exudate.  Eyes: EOM are normal. Pupils are equal, round, and reactive to light.  Neck: Normal range of motion. Neck supple.  Cardiovascular: Normal rate and regular rhythm.   Pulmonary/Chest: Effort normal and breath sounds normal.  Abdominal: Soft. Bowel sounds are normal. There is no tenderness. There is no rebound and no guarding.  Musculoskeletal: Normal range of motion. She exhibits tenderness. She exhibits no edema.  Patient has painful range of motion of bilateral hips. She is tender to palpation of the lateral surface  of bilateral hips. There is no obvious shortening of the lower extremities. No calf tenderness or asymmetry. 2+ dorsalis pedis and posterior tibial pulses in bilateral feet. Patient does have tenderness to palpation diffusely in the midline lumbar spine. Also appreciated and paraspinal musculature. No definite CVA tenderness. No step-offs or  deformity.  Neurological: She is alert and oriented to person, place, and time.  5/5 motor in bilateral lower extremities. Sensation fully intact. No saddle anesthesia.  Skin: Skin is warm and dry. Capillary refill takes less than 2 seconds. No rash noted. No erythema.  Psychiatric: She has a normal mood and affect. Her behavior is normal.  Nursing note and vitals reviewed.    ED Treatments / Results  Labs (all labs ordered are listed, but only abnormal results are displayed) Labs Reviewed  CBC WITH DIFFERENTIAL/PLATELET - Abnormal; Notable for the following:       Result Value   RBC 3.71 (*)    Hemoglobin 10.9 (*)    HCT 34.4 (*)    All other components within normal limits  COMPREHENSIVE METABOLIC PANEL - Abnormal; Notable for the following:    BUN 23 (*)    Creatinine, Ser 1.14 (*)    ALT 13 (*)    GFR calc non Af Amer 50 (*)    GFR calc Af Amer 58 (*)    All other components within normal limits  URINALYSIS, ROUTINE W REFLEX MICROSCOPIC - Abnormal; Notable for the following:    Hgb urine dipstick MODERATE (*)    Squamous Epithelial / LPF 0-5 (*)    All other components within normal limits  SEDIMENTATION RATE - Abnormal; Notable for the following:    Sed Rate 63 (*)    All other components within normal limits  CBG MONITORING, ED - Abnormal; Notable for the following:    Glucose-Capillary 103 (*)    All other components within normal limits  CBG MONITORING, ED    EKG  EKG Interpretation None       Radiology Mr Lumbar Spine Wo Contrast  Result Date: 08/16/2016 CLINICAL DATA:  Difficulty ambulating. Right hip pain with back pain.  Prior back surgery. EXAM: MRI LUMBAR SPINE WITHOUT CONTRAST TECHNIQUE: Multiplanar, multisequence MR imaging of the lumbar spine was performed. No intravenous contrast was administered. COMPARISON:  MRI lumbar spine 01/28/2015 FINDINGS: Segmentation:  Normal Alignment:  Normal Vertebrae: Negative for fracture or mass. Mild bone marrow edema is present at L4-5 extending into the pedicle and posterior elements of L4-L5. This is most consistent with disc and facet degeneration change. Conus medullaris: Extends to the L1 level and appears normal. Paraspinal and other soft tissues: No paraspinous mass or fluid collection. Retroperitoneal structures negative Disc levels: L1-2:  Negative L2-3:  Negative L3-4: Diffuse disc bulging and small central disc protrusion. Negative for stenosis. No change from the prior study. Mild facet degeneration. L4-5: Left laminotomy. Extensive disc degeneration with moderately large central disc and osteophyte complex unchanged from the prior study. Progressive facet degeneration at this level with progressive bony overgrowth and facet joint effusion bilaterally. Moderate spinal stenosis has progressed. Moderate subarticular stenosis and right foraminal encroachment also has progressed L5-S1: Left laminectomy. Prominent left-sided osteophyte unchanged causing subarticular stenosis which could affect the left S1 nerve root. Left-sided osteophyte has progressed in the interval and is displacing the left S1 nerve root posteriorly and could be causing impingement. IMPRESSION: Left laminotomy L4-5. Moderately large central disc and osteophyte complex similar to the prior study. Progressive facet degeneration. Progressive spinal and subarticular stenosis bilaterally. Moderate right foraminal encroachment also has progressed Left laminectomy L5-S1. Progressive osteophyte on the left with possible left S1 nerve root impingement. Electronically Signed   By: Franchot Gallo M.D.   On: 08/16/2016 13:43     Dg Hips Bilat W Or Wo Pelvis 5  Views  Result Date: 08/16/2016 CLINICAL DATA:  Pelvic pain x 1 month. Pain worsened on Friday, and then yesterday pt struggled to get out of bed. Today she was unable to bear weight. No known injury. EXAM: DG HIP (WITH OR WITHOUT PELVIS) 5+V BILAT COMPARISON:  06/25/2011 FINDINGS: There is no evidence of hip fracture or dislocation. Progressive osteitis pubis. There is no evidence of arthropathy or other focal bone abnormality. Bilateral pelvic phleboliths. IMPRESSION: 1. Progressive osteitis pubis. 2. Negative hips. Electronically Signed   By: Lucrezia Europe M.D.   On: 08/16/2016 08:52    Procedures Procedures (including critical care time)  Medications Ordered in ED Medications  ketorolac (TORADOL) injection 60 mg (60 mg Intramuscular Given 08/16/16 1002)  methocarbamol (ROBAXIN) tablet 500 mg (500 mg Oral Given 08/16/16 1001)  LORazepam (ATIVAN) tablet 1 mg (1 mg Oral Given 08/16/16 1218)  LORazepam (ATIVAN) injection 0.5 mg (0.5 mg Intravenous Given 08/16/16 1251)  LORazepam (ATIVAN) injection 0.5 mg (0.5 mg Intravenous Given 08/16/16 1302)     Initial Impression / Assessment and Plan / ED Course  I have reviewed the triage vital signs and the nursing notes.  Pertinent labs & imaging results that were available during my care of the patient were reviewed by me and considered in my medical decision making (see chart for details).     Patient states pain has improved after medication. X-ray with progressive osteitis pubis. Patient does have an elevation in her sedimentation rate. Discussed with Dr. Marlou Sa. Thinks unlikely that osteitis pubis is causing her difficulty ambulating and radiating pain. Recommends MRI lumbar spine to rule out stenosis.  Patient does have some degree of stenosis and radiculopathy on MRI. States she is feeling better. Has been able to ambulate. Required sedation for MRI and simply remains drowsy. We'll give short course of steroids and have  follow-up closely with her orthopedist once she is more alert. 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

## 2016-09-08 ENCOUNTER — Ambulatory Visit: Payer: Medicare Other | Admitting: Diagnostic Neuroimaging

## 2016-09-23 ENCOUNTER — Ambulatory Visit: Payer: Medicare Other | Admitting: Diagnostic Neuroimaging

## 2016-09-28 ENCOUNTER — Encounter: Payer: Self-pay | Admitting: Diagnostic Neuroimaging

## 2016-09-28 ENCOUNTER — Ambulatory Visit (INDEPENDENT_AMBULATORY_CARE_PROVIDER_SITE_OTHER): Payer: Medicare Other | Admitting: Diagnostic Neuroimaging

## 2016-09-28 ENCOUNTER — Encounter (INDEPENDENT_AMBULATORY_CARE_PROVIDER_SITE_OTHER): Payer: Self-pay

## 2016-09-28 VITALS — BP 140/79 | HR 82 | Ht 62.0 in | Wt 227.4 lb

## 2016-09-28 DIAGNOSIS — M5416 Radiculopathy, lumbar region: Secondary | ICD-10-CM

## 2016-09-28 NOTE — Patient Instructions (Signed)
Thank you for coming to see [place] at [facility]. I hope we have been able to provide you high quality care today.  You may receive a patient satisfaction survey over the next few weeks. We would appreciate your feedback and comments so that we may continue to improve ourselves and the health of our patients.   - recommend pain management clinic referral  - recommend physical therapy referral  - if no improvement in 6-8 weeks, then consider spine surgery consult     ~~~~~~~~~~~~~~~~~~~~~~~~~~~~~~~~~~~~~~~~~~~~~~~~~~~~~~~~~~~~~~~~~  DR. Korver Graybeal'S GUIDE TO HAPPY AND HEALTHY LIVING These are some of my general health and wellness recommendations. Some of them may apply to you better than others. Please use common sense as you try these suggestions and feel free to ask me any questions.   ACTIVITY/FITNESS Mental, social, emotional and physical stimulation are very important for brain and body health. Try learning a new activity (arts, music, language, sports, games).  Keep moving your body to the best of your abilities. You can do this at home, inside or outside, the park, community center, gym or anywhere you like. Consider a physical therapist or personal trainer to get started. Consider the app Sworkit. Fitness trackers such as smart-watches, smart-phones or Fitbits can help as well.   NUTRITION Eat more plants: colorful vegetables, nuts, seeds and berries.  Eat less sugar, salt, preservatives and processed foods.  Avoid toxins such as cigarettes and alcohol.  Drink water when you are thirsty. Warm water with a slice of lemon is an excellent morning drink to start the day.  Consider these websites for more information The Nutrition Source (https://www.henry-hernandez.biz/) [employer] Nutrition (WindowBlog.ch)   RELAXATION Consider practicing mindfulness meditation or other relaxation techniques such as deep breathing,  prayer, yoga, tai chi, massage. See website mindful.org or the apps Headspace or Calm to help get started.   SLEEP Try to get at least 7-8+ hours sleep per day. Regular exercise and reduced caffeine will help you sleep better. Practice good sleep hygeine techniques. See website sleep.org for more information.   PLANNING Prepare estate planning, living will, healthcare POA documents. Sometimes this is best planned with the help of an attorney. Theconversationproject.org and agingwithdignity.org are excellent resources.

## 2016-09-28 NOTE — Progress Notes (Signed)
[facility]  PATIENT: Christina Solis DOB: 1951/04/11  REFERRING CLINICIAN: Eldridge Abrahams, FNP HISTORY FROM: patient and chart review REASON FOR VISIT: new consult    HISTORICAL  CHIEF COMPLAINT:  Chief Complaint  Patient presents with  . Lumbago w/sciatica, L side    rm 7, New Pt    HISTORY OF PRESENT ILLNESS:   66 year old left-handed female here for evaluation of low back pain. Patient has history of low back surgery in 1992 and 1995. Symptoms were fairly stable until 2009 when back pain recurred. At that time she was under pain management, in [place]. Patient moved a 2012 but continued working with pain management clinic in [place] until 2015. patient was having more problems with back pain radiating to both legs.  In last 3-6 months symptoms have significantly worsened. She has pain shooting symptoms into her right hip and right leg more than left side.  Patient had MRI of the lumbar spine which shows multiple degenerative changes and possible neural impingement.    REVIEW OF SYSTEMS: Full 14 system review of systems performed and negative with exception of: Anemia sleepiness snoring headache numbness weakness feeling hot feeling cold shortness of breath chest pain swelling in legs ringing in ears constipation due to pain cramps Raynaud's.  ALLERGIES: Allergies  Allergen Reactions  . Coconut Oil Hives    HOME MEDICATIONS: Outpatient Medications Prior to Visit  Medication Sig Dispense Refill  . albuterol (PROAIR HFA) 108 (90 BASE) MCG/ACT inhaler Inhale 1 puff into the lungs every 6 (six) hours as needed for wheezing or shortness of breath. ProAir     . aspirin EC 81 MG tablet Take 81 mg by mouth daily.     . budesonide-formoterol (SYMBICORT) 160-4.5 MCG/ACT inhaler Inhale 2 puffs into the lungs 2 (two) times daily.    . clopidogrel (PLAVIX) 75 MG tablet Take 75 mg by mouth daily.   1  . dicyclomine (BENTYL) 20 MG tablet Take 20 mg by mouth 4 (four)  times daily as needed for spasms.     Marland Kitchen ezetimibe (ZETIA) 10 MG tablet Take 10 mg by mouth daily with supper.    . famotidine (PEPCID) 40 MG tablet Take 40 mg by mouth 2 (two) times daily.    . ferrous sulfate 325 (65 FE) MG tablet Take 325 mg by mouth daily with breakfast.     . HUMALOG MIX 75/25 KWIKPEN (75-25) 100 UNIT/ML Kwikpen Inject 22 Units into the skin 2 (two) times daily with a meal.   2  . lisinopril-hydrochlorothiazide (PRINZIDE,ZESTORETIC) 20-12.5 MG per tablet Take 1 tablet by mouth daily.     . meloxicam (MOBIC) 15 MG tablet TAKE 1 TABLET BY MOUTH DAILY  0  . pregabalin (LYRICA) 200 MG capsule Take 200 mg by mouth 3 (three) times daily.    . promethazine (PHENERGAN) 25 MG tablet Take 25 mg by mouth every 6 (six) hours as needed for nausea or vomiting.     . rosuvastatin (CRESTOR) 10 MG tablet Take 10 mg by mouth at bedtime.     Marland Kitchen tiZANidine (ZANAFLEX) 4 MG tablet Take 4 mg by mouth every 6 (six) hours.    . traMADol (ULTRAM) 50 MG tablet Take 1 tablet (50 mg total) by mouth every 6 (six) hours as needed for moderate pain. 15 tablet 0  . Vitamin D, Ergocalciferol, (DRISDOL) 50000 UNITS CAPS capsule Take 50,000 Units by mouth every Tuesday.     Marland Kitchen albuterol (PROVENTIL) (2.5 MG/3ML) 0.083% nebulizer solution Take  2.5 mg by nebulization every 6 (six) hours as needed for wheezing or shortness of breath.     . diltiazem (CARDIZEM CD) 180 MG 24 hr capsule Take 1 capsule (180 mg total) by mouth daily. (Patient not taking: Reported on 09/28/2016) 30 capsule 0  . chlorpheniramine-HYDROcodone (TUSSIONEX) 10-8 MG/5ML SUER Take 5 mLs by mouth every 12 (twelve) hours as needed for cough. (Patient not taking: Reported on 08/16/2016) 140 mL 0  . Menthol-Methyl Salicylate (MUSCLE RUB) 10-15 % CREA Apply 1 application topically as needed for muscle pain.    . predniSONE (DELTASONE) 20 MG tablet 3 tabs po day one, then 2 tabs daily x 4 days 11 tablet 0  . umeclidinium bromide (INCRUSE ELLIPTA) 62.5  MCG/INH AEPB Inhale 1 puff into the lungs daily. (Patient not taking: Reported on 08/16/2016) 1 each 0   No facility-administered medications prior to visit.     PAST MEDICAL HISTORY: Past Medical History:  Diagnosis Date  . Anemia    sickle cell trait  . Anxiety   . Arthritis    "knees" (10/12/2013)  . CHF (congestive heart failure) ([place])   . Chronic back pain   . Chronic bronchitis ([place])    "got it q yr when I lived in [place]"  . Complication of anesthesia    hard to put under  . COPD (chronic obstructive pulmonary disease) ([place])   . Dysrhythmia   . Family history of anesthesia complication    sister hard to wake up  . Family history of anesthesia complication    niece have itching  . Fibromyalgia   . GERD (gastroesophageal reflux disease)   . Hyperlipidemia   . Hypertension   . Irregular heart beat   . Migraine    "used to get them alot; not regular anymore" (10/12/2013)  . Pituitary tumor    followed at [facility]; on cabergoline 09/2013  . Pneumonia    "twice"  . Type II diabetes mellitus ([place])     PAST SURGICAL HISTORY: Past Surgical History:  Procedure Laterality Date  . ABDOMINAL AORTAGRAM N/A 06/28/2012   Procedure: ABDOMINAL Maxcine Ham;  Surgeon: Laverda Page, MD;  Location: [facility] CATH LAB;  Service: Cardiovascular;  Laterality: N/A;  . APPENDECTOMY    . BACK SURGERY     x 2, lower back  . BILATERAL CARPAL TUNNEL RELEASE    . CARDIAC CATHETERIZATION    . CESAREAN SECTION  1974; 1976; 1978  . CHOLECYSTECTOMY N/A 10/12/2013   Procedure: LAPAROSCOPIC CHOLECYSTECTOMY WITH INTRAOPERATIVE CHOLANGIOGRAM;  Surgeon: [name]. Georgette Dover, MD;  Location: [place];  Service: General;  Laterality: N/A;  . COLONOSCOPY    . FEMORAL ARTERY STENT Left 06/28/2012   SFA  . LAPAROSCOPIC CHOLECYSTECTOMY  10/12/2013  . LEFT HEART CATHETERIZATION WITH CORONARY ANGIOGRAM N/A 06/28/2012   Procedure: LEFT HEART CATHETERIZATION WITH CORONARY ANGIOGRAM;  Surgeon: Laverda Page,  MD;  Location[facility];  Service: Cardiovascular;  Laterality: N/A;  . LOWER EXTREMITY ANGIOGRAM N/A 06/28/2012   Procedure: LOWER EXTREMITY ANGIOGRAM;  Surgeon: Laverda Page, MD;  Location: [facility] CATH LAB;  Service: Cardiovascular;  Laterality: N/A;  . PERIPHERAL VASCULAR CATHETERIZATION N/A 09/10/2015   Procedure: Abdominal Aortogram w/Lower Extremity;  Surgeon: Adrian Prows, MD;  Location: [place] CV LAB;  Service: Cardiovascular;  Laterality: N/A;  . POSTERIOR LUMBAR FUSION    . REPLACEMENT TOTAL KNEE Left   . TONSILLECTOMY    . TUBAL LIGATION  1978    FAMILY HISTORY: Family History  Problem Relation Age of Onset  . Diabetes Mother   . Kidney disease Mother   . Cancer Father   . Kidney disease Brother   . Cancer Brother     SOCIAL HISTORY:  Social History   Social History  . Marital status: Divorced    Spouse name: N/A  . Number of children: 3  . Years of education: 34   Occupational History  .      retired Therapist, sports   Social History Main Topics  . Smoking status: Former Smoker    Packs/day: 0.50    Years: 46.00    Types: Cigarettes    Start date: 06/20/2000    Quit date: 09/29/2015  . Smokeless tobacco: Never Used  . 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 Drug use: No  . Sexual activity: No   Other Topics Concern  . Not on file   Social History Narrative   Lives with daughter    caffeine- none     PHYSICAL EXAM  GENERAL EXAM/CONSTITUTIONAL: Vitals:  Vitals:   09/28/16 1006  BP: 140/79  Pulse: 82  Weight: 227 lb 6.4 oz (103.1 kg)  Height: 5\' 2"  (1.575 m)     Body mass index is 41.59 kg/m.  Visual Acuity Screening   Right eye Left eye Both eyes  Without correction: 20/30 20/30   With correction:        Patient is in no distress; well developed, nourished and groomed; neck is supple  CARDIOVASCULAR:  Examination of carotid arteries is normal; no carotid bruits  Regular rate and rhythm, no murmurs  Examination of  peripheral vascular system by observation and palpation is normal  EYES:  Ophthalmoscopic exam of optic discs and posterior segments is normal; no papilledema or hemorrhages  MUSCULOSKELETAL:  Gait, strength, tone, movements noted in Neurologic exam below  NEUROLOGIC: MENTAL STATUS:  No flowsheet data found.  awake, alert, oriented to person, place and time  recent and remote memory intact  normal attention and concentration  language fluent, comprehension intact, naming intact,   fund of knowledge appropriate  CRANIAL NERVE:   2nd - no papilledema on fundoscopic exam  2nd, 3rd, 4th, 6th - pupils equal and reactive to light, visual fields full to confrontation, extraocular muscles intact, no nystagmus  5th - facial sensation symmetric  7th - facial strength symmetric  8th - hearing intact  9th - palate elevates symmetrically, uvula midline  11th - shoulder shrug symmetric  12th - tongue protrusion midline  MOTOR:   normal bulk and tone, full strength in the BUE, BLE  SENSORY:   normal and symmetric to light touch, temperature, vibration  COORDINATION:   finger-nose-finger, fine finger movements normal  REFLEXES:   deep tendon reflexes TRACE and symmetric  GAIT/STATION:   narrow based gait; ANTALGIC GAIT    DIAGNOSTIC DATA (LABS, IMAGING, TESTING) - I reviewed patient records, labs, notes, testing and imaging myself where available.  Lab Results  Component Value Date   WBC 8.4 08/16/2016   HGB 10.9 (L) 08/16/2016   HCT 34.4 (L) 08/16/2016   MCV 92.7 08/16/2016   PLT 220 08/16/2016      Component Value Date/Time   NA 142 08/16/2016 0850   K 3.8 08/16/2016 0850   CL 107 08/16/2016 0850   CO2 26 08/16/2016 0850   GLUCOSE 81 08/16/2016 0850   BUN 23 (H) 08/16/2016 0850   CREATININE 1.14 (H) 08/16/2016 0850   CALCIUM  9.4 08/16/2016 0850   PROT 6.9 08/16/2016 0850   ALBUMIN 3.7 08/16/2016 0850   AST 17 08/16/2016 0850   ALT 13 (L)  08/16/2016 0850   ALKPHOS 67 08/16/2016 0850   BILITOT 0.3 08/16/2016 0850   GFRNONAA 50 (L) 08/16/2016 0850   GFRAA 58 (L) 08/16/2016 0850   Lab Results  Component Value Date   CHOL 113 02/07/2016   HDL 30 (L) 02/07/2016   LDLCALC 48 02/07/2016   TRIG 174 (H) 02/07/2016   CHOLHDL 3.8 02/07/2016   Lab Results  Component Value Date   HGBA1C 7.0 (H) 02/07/2016   No results found for: VITAMINB12 No results found for: TSH   08/16/16 MRI lumbar spine [I reviewed images myself and agree with interpretation. -VRP]  - Left laminotomy L4-5. Moderately large central disc and osteophyte complex similar to the prior study. Progressive facet degeneration. Progressive spinal and subarticular stenosis bilaterally. Moderate right foraminal encroachment also has progressed. - Left laminectomy L5-S1. Progressive osteophyte on the left with possible left S1 nerve root impingement.     ASSESSMENT AND PLAN  66 y.o. year old female here with chronic low back pain since 1992, s/p lumbar laminectomies in 1992 and 1995, and now with worsenign pain with radicular symptoms since 2015. Likely represent progression of lumbar degenerative spine disease.  Dx: lumbar spine dz (lumbar radiculopathies)  1. Lumbar radiculopathy      PLAN: - recommend pain management clinic referral - recommend physical therapy referral - if no improvement in 6-8 weeks, then consider spine surgery consult  - will forward these recommendations to patient's PCP to place orders  Return if symptoms worsen or fail to improve, for return to PCP.    Penni Bombard, MD 9/50/9326, 71:24 AM Certified in Neurology, Neurophysiology and Neuroimaging  [facility] [address], [place], [place] [address] [phone]

## 2016-09-28 NOTE — Progress Notes (Signed)
Thanks for the referral!

## 2017-02-15 ENCOUNTER — Telehealth: Payer: Self-pay | Admitting: Cardiovascular Disease

## 2017-02-15 NOTE — Telephone Encounter (Signed)
02/15/2017 Received Faxed referrral from [facility] for upcoming appointment with Dr. Fletcher Anon on 10:30 am @ 9:40.  Records given to [facility].  cbr

## 2017-03-16 ENCOUNTER — Ambulatory Visit (INDEPENDENT_AMBULATORY_CARE_PROVIDER_SITE_OTHER): Payer: Medicare Other | Admitting: Cardiovascular Disease

## 2017-03-16 VITALS — BP 146/74 | HR 72 | Ht 62.0 in | Wt 234.0 lb

## 2017-03-16 DIAGNOSIS — R0602 Shortness of breath: Secondary | ICD-10-CM

## 2017-03-16 DIAGNOSIS — R079 Chest pain, unspecified: Secondary | ICD-10-CM

## 2017-03-16 DIAGNOSIS — I739 Peripheral vascular disease, unspecified: Secondary | ICD-10-CM | POA: Diagnosis not present

## 2017-03-16 DIAGNOSIS — E785 Hyperlipidemia, unspecified: Secondary | ICD-10-CM

## 2017-03-16 NOTE — Progress Notes (Signed)
Cardiology Office Note   Date:  03/16/2017   ID:  [name], [name] 1951-05-12, MRN [id number]  PCP:  [name], NP  Cardiologist:   Kathlyn Sacramento, MD   No chief complaint on file.     History of Present Illness: Christina Solis is a 66 y.o. female who  was referred by Eldridge Abrahams to establish cardiovascular care.  She used to follow-up with Dr. Einar Gip.  She has known history of peripheral arterial disease status post left SFA stenting about 4 years ago, history of congestive heart failure with preserved ejection fraction, type 2 diabetes, previous tobacco use, essential hypertension, hyperlipidemia and obesity. She quit smoking about 1 year ago. She reports remote history of cardiac catheterization more than 10 years ago when she lived in [place]. She complains of intermittent episodes of substernal chest tightness both at rest and with physical activities lasting for a few minutes with no radiation.  She also reports dyspnea with minimal activities with no orthopnea, PND or leg edema.  In terms of peripheral arterial disease, she reports right leg discomfort after walking a short distance especially affecting the right calf.  This is not consistent at a certain distance. Most recent lower extremity angiogram in 2017 showed mild to moderate nonobstructive disease with patent left SFA stent.  Past Medical History:  Diagnosis Date  . Anemia    sickle cell trait  . Anxiety   . Arthritis    "knees" (10/12/2013)  . CHF (congestive heart failure) ([place])   . Chronic back pain   . Chronic bronchitis ([place])    "got it q yr when I lived in [place]"  . Complication of anesthesia    hard to put under  . COPD (chronic obstructive pulmonary disease) ([place])   . Dysrhythmia   . Family history of anesthesia complication    sister hard to wake up  . Family history of anesthesia complication    niece have itching  . Fibromyalgia   . GERD (gastroesophageal reflux disease)   . Hyperlipidemia    . Hypertension   . Irregular heart beat   . Migraine    "used to get them alot; not regular anymore" (10/12/2013)  . PAD (peripheral artery disease) ([place])   . Pituitary tumor    followed at [facility]; on cabergoline 09/2013  . Pneumonia    "twice"  . Type II diabetes mellitus ([place])     Past Surgical History:  Procedure Laterality Date  . ABDOMINAL AORTAGRAM N/A 06/28/2012   Procedure: ABDOMINAL Maxcine Ham;  Surgeon: Laverda Page, MD;  Location: [facility] CATH LAB;  Service: Cardiovascular;  Laterality: N/A;  . APPENDECTOMY    . BACK SURGERY     x 2, lower back  . BILATERAL CARPAL TUNNEL RELEASE    . CARDIAC CATHETERIZATION    . CESAREAN SECTION  1974; 1976; 1978  . CHOLECYSTECTOMY N/A 10/12/2013   Procedure: LAPAROSCOPIC CHOLECYSTECTOMY WITH INTRAOPERATIVE CHOLANGIOGRAM;  Surgeon: [name]. Georgette Dover, MD;  Location: [place];  Service: General;  Laterality: N/A;  . COLONOSCOPY    . FEMORAL ARTERY STENT Left 06/28/2012   SFA  . LAPAROSCOPIC CHOLECYSTECTOMY  10/12/2013  . LEFT HEART CATHETERIZATION WITH CORONARY ANGIOGRAM N/A 06/28/2012   Procedure: LEFT HEART CATHETERIZATION WITH CORONARY ANGIOGRAM;  Surgeon: Laverda Page, MD;  Location: [facility] CATH LAB;  Service: Cardiovascular;  Laterality: N/A;  . LOWER EXTREMITY ANGIOGRAM N/A 06/28/2012   Procedure: LOWER EXTREMITY ANGIOGRAM;  Surgeon: Laverda Page, MD;  Location: [place] CATH LAB;  Service: Cardiovascular;  Laterality: N/A;  . PERIPHERAL VASCULAR CATHETERIZATION N/A 09/10/2015   Procedure: Abdominal Aortogram w/Lower Extremity;  Surgeon: Adrian Prows, MD;  Location: [place] CV LAB;  Service: Cardiovascular;  Laterality: N/A;  . POSTERIOR LUMBAR FUSION    . REPLACEMENT TOTAL KNEE Left   . TONSILLECTOMY    . TUBAL LIGATION  1978     Current Outpatient Prescriptions  Medication Sig Dispense Refill  . albuterol (PROAIR HFA) 108 (90 BASE) MCG/ACT inhaler Inhale 1 puff into the lungs every 6 (six) hours as needed for  wheezing or shortness of breath. ProAir     . albuterol (PROVENTIL) (2.5 MG/3ML) 0.083% nebulizer solution Take 2.5 mg by nebulization every 6 (six) hours as needed for wheezing or shortness of breath.     Marland Kitchen aspirin EC 81 MG tablet Take 81 mg by mouth daily.     . budesonide-formoterol (SYMBICORT) 160-4.5 MCG/ACT inhaler Inhale 2 puffs into the lungs 2 (two) times daily.    . clopidogrel (PLAVIX) 75 MG tablet Take 75 mg by mouth daily.   1  . dicyclomine (BENTYL) 20 MG tablet Take 20 mg by mouth 4 (four) times daily as needed for spasms.     Marland Kitchen diltiazem (CARDIZEM CD) 180 MG 24 hr capsule Take 1 capsule (180 mg total) by mouth daily. 30 capsule 0  . ezetimibe (ZETIA) 10 MG tablet Take 10 mg by mouth daily with supper.    . famotidine (PEPCID) 40 MG tablet Take 40 mg by mouth 2 (two) times daily.    . ferrous sulfate 325 (65 FE) MG tablet Take 325 mg by mouth daily with breakfast.     . HUMALOG MIX 75/25 KWIKPEN (75-25) 100 UNIT/ML Kwikpen Inject 22 Units into the skin 2 (two) times daily with a meal.   2  . HYDROcodone-acetaminophen (NORCO) 10-325 MG tablet TK 1 T PO QID PRN P    . lisinopril-hydrochlorothiazide (PRINZIDE,ZESTORETIC) 20-12.5 MG per tablet Take 1 tablet by mouth daily.     . meloxicam (MOBIC) 15 MG tablet TAKE 1 TABLET BY MOUTH DAILY  0  . pregabalin (LYRICA) 200 MG capsule Take 200 mg by mouth 3 (three) times daily.    . promethazine (PHENERGAN) 25 MG tablet Take 25 mg by mouth every 6 (six) hours as needed for nausea or vomiting.     . rosuvastatin (CRESTOR) 10 MG tablet Take 10 mg by mouth at bedtime.     Marland Kitchen tiZANidine (ZANAFLEX) 4 MG tablet Take 4 mg by mouth every 6 (six) hours.    . Vitamin D, Ergocalciferol, (DRISDOL) 50000 UNITS CAPS capsule Take 50,000 Units by mouth every Tuesday.      No current facility-administered medications for this visit.     Allergies:   Coconut oil    Social History:  The patient  reports that she quit smoking about 17 months ago. Her  smoking use included Cigarettes. She started smoking about 16 years ago. She has a 23.00 pack-year smoking history. She has never used smokeless tobacco. She reports that she does not drink alcohol or use drugs.   Family History:  The patient's family history includes Cancer in her brother and father; Diabetes in her mother; Kidney disease in her brother and mother.    ROS:  Please see the history of present illness.   Otherwise, review of systems are positive for none.   All other systems are reviewed and negative.    PHYSICAL EXAM: VS:  BP (!) 146/74   Pulse 72   Ht 5\' 2"  (1.575 m)   Wt 234 lb (106.1 kg)   BMI 42.80 kg/m  , BMI Body mass index is 42.8 kg/m. GEN: Well nourished, well developed, in no acute distress  HEENT: normal  Neck: no JVD, carotid bruits, or masses Cardiac: RRR; no murmurs, rubs, or gallops,no edema  Respiratory:  clear to auscultation bilaterally, normal work of breathing GI: soft, nontender, nondistended, + BS MS: no deformity or atrophy  Skin: warm and dry, no rash Neuro:  Strength and sensation are intact Psych: euthymic mood, full affect Vascular: Femoral pulses +2 bilaterally.  Distal pulses are palpable but diminished.   EKG:  EKG is ordered today. The ekg ordered today demonstrates normal sinus rhythm with nonspecific anterior T wave changes   Recent Labs: 08/16/2016: ALT 13; BUN 23; Creatinine, Ser 1.14; Hemoglobin 10.9; Platelets 220; Potassium 3.8; Sodium 142    Lipid Panel    Component Value Date/Time   CHOL 113 02/07/2016 0659   TRIG 174 (H) 02/07/2016 0659   HDL 30 (L) 02/07/2016 0659   CHOLHDL 3.8 02/07/2016 0659   VLDL 35 02/07/2016 0659   LDLCALC 48 02/07/2016 0659      Wt Readings from Last 3 Encounters:  03/16/17 234 lb (106.1 kg)  09/28/16 227 lb 6.4 oz (103.1 kg)  08/16/16 221 lb (100.2 kg)       No flowsheet data found.    ASSESSMENT AND PLAN:  1.  Peripheral arterial disease: Previous history of left SFA  stenting which was patent on angiogram in 2017.  She complains of atypical right leg claudication.  I requested a follow-up lower extremity arterial Doppler for evaluation. I reviewed her angiogram from 2017 which showed mild to moderate nonobstructive disease.  2.  Atypical chest pain: Multiple risk factors for coronary artery disease with slightly abnormal EKG showing nonspecific anterior T wave changes.  I requested a pharmacologic nuclear stress test for evaluation.  She is not able to exercise on a treadmill.  3.  Exertional dyspnea: Could be due to chronic diastolic heart failure as well as physical deconditioning.  It appears that she gained about 13 pounds since April.  I requested an echocardiogram.  4.  Hyperlipidemia: Continue treatment with rosuvastatin with a target LDL of less than 70.    Disposition:   FU with me after testing  Signed,  Kathlyn Sacramento, MD  03/16/2017 9:46 AM    [place]

## 2017-03-16 NOTE — Patient Instructions (Signed)
Medication Instructions:  Continue current medications  If you need a refill on your cardiac medications before your next appointment, please call your pharmacy.  Labwork: None Ordered  Testing/Procedures: Your physician has requested that you have an echocardiogram. Echocardiography is a painless test that uses sound waves to create images of your heart. It provides your doctor with information about the size and shape of your heart and how well your heart's chambers and valves are working. This procedure takes approximately one hour. There are no restrictions for this procedure.  Your physician has requested that you have a lexiscan myoview. For further information please visit [email or web address]. Please follow instruction sheet, as given.  Your physician has requested that you have a lower extremity arterial duplex. This test is an ultrasound of the arteries in the legs. It looks at arterial blood flow in the legs. Allow one hour for Lower and Upper Arterial scans. There are no restrictions or special instructions   Follow-Up: Your physician wants you to follow-up in: After Test.    Thank you for choosing [facility]!!

## 2017-03-17 ENCOUNTER — Ambulatory Visit (HOSPITAL_COMMUNITY): Payer: PRIVATE HEALTH INSURANCE

## 2017-03-22 ENCOUNTER — Other Ambulatory Visit: Payer: Self-pay

## 2017-03-22 ENCOUNTER — Ambulatory Visit (HOSPITAL_COMMUNITY): Payer: Medicare Other | Attending: Cardiology

## 2017-03-22 DIAGNOSIS — I0989 Other specified rheumatic heart diseases: Secondary | ICD-10-CM | POA: Diagnosis not present

## 2017-03-22 DIAGNOSIS — R0602 Shortness of breath: Secondary | ICD-10-CM

## 2017-03-22 DIAGNOSIS — I5030 Unspecified diastolic (congestive) heart failure: Secondary | ICD-10-CM | POA: Insufficient documentation

## 2017-03-22 DIAGNOSIS — I420 Dilated cardiomyopathy: Secondary | ICD-10-CM | POA: Insufficient documentation

## 2017-03-22 NOTE — Addendum Note (Signed)
Addended by: Milderd Meager on: 03/22/2017 01:13 PM   Modules accepted: Orders

## 2017-04-06 ENCOUNTER — Other Ambulatory Visit: Payer: Self-pay | Admitting: Cardiovascular Disease

## 2017-04-06 DIAGNOSIS — I739 Peripheral vascular disease, unspecified: Secondary | ICD-10-CM

## 2017-04-14 ENCOUNTER — Telehealth (HOSPITAL_COMMUNITY): Payer: Self-pay | Admitting: *Deleted

## 2017-04-14 ENCOUNTER — Telehealth (HOSPITAL_COMMUNITY): Payer: Self-pay

## 2017-04-14 NOTE — Telephone Encounter (Signed)
Encounter complete. 

## 2017-04-14 NOTE — Telephone Encounter (Signed)
Close encounter 

## 2017-04-15 ENCOUNTER — Inpatient Hospital Stay (HOSPITAL_COMMUNITY)
Admission: RE | Admit: 2017-04-15 | Payer: Medicare Other | Source: Ambulatory Visit | Attending: Cardiovascular Disease | Admitting: Cardiovascular Disease

## 2017-04-15 ENCOUNTER — Ambulatory Visit (HOSPITAL_COMMUNITY)
Admission: RE | Admit: 2017-04-15 | Payer: Medicare Other | Source: Ambulatory Visit | Attending: Cardiovascular Disease | Admitting: Cardiovascular Disease

## 2017-04-16 ENCOUNTER — Ambulatory Visit (HOSPITAL_COMMUNITY): Admission: RE | Admit: 2017-04-16 | Payer: Medicare Other | Source: Ambulatory Visit

## 2017-04-20 ENCOUNTER — Ambulatory Visit: Payer: PRIVATE HEALTH INSURANCE | Admitting: Cardiovascular Disease

## 2017-04-20 NOTE — Progress Notes (Deleted)
Cardiology Office Note   Date:  04/20/2017   ID:  Christina Solis, Christina Solis [birth year], MRN [id number]  PCP:  [name], NP  Cardiologist:   Kathlyn Sacramento, MD   No chief complaint on file.     History of Present Illness: Christina Solis is a 66 y.o. female who  was referred by Eldridge Abrahams to establish cardiovascular care.  She used to follow-up with Dr. Einar Gip.  She has known history of peripheral arterial disease status post left SFA stenting about 4 years ago, history of congestive heart failure with preserved ejection fraction, type 2 diabetes, previous tobacco use, essential hypertension, hyperlipidemia and obesity. She quit smoking about 1 year ago. She reports remote history of cardiac catheterization more than 10 years ago when she lived in [place]. She complains of intermittent episodes of substernal chest tightness both at rest and with physical activities lasting for a few minutes with no radiation.  She also reports dyspnea with minimal activities with no orthopnea, PND or leg edema.  In terms of peripheral arterial disease, she reports right leg discomfort after walking a short distance especially affecting the right calf.  This is not consistent at a certain distance. Most recent lower extremity angiogram in 2017 showed mild to moderate nonobstructive disease with patent left SFA stent.  Past Medical History:  Diagnosis Date  . Anemia    sickle cell trait  . Anxiety   . Arthritis    "knees" (10/12/2013)  . CHF (congestive heart failure) ([place])   . Chronic back pain   . Chronic bronchitis ([place])    "got it q yr when I lived in [place]"  . Complication of anesthesia    hard to put under  . COPD (chronic obstructive pulmonary disease) ([place])   . Dysrhythmia   . Family history of anesthesia complication    sister hard to wake up  . Family history of anesthesia complication    niece have itching  . Fibromyalgia   . GERD (gastroesophageal reflux disease)   . Hyperlipidemia    . Hypertension   . Irregular heart beat   . Migraine    "used to get them alot; not regular anymore" (10/12/2013)  . PAD (peripheral artery disease) ([place])   . 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 Pneumonia    "twice"  . Type II diabetes mellitus ([place])     Past Surgical History:  Procedure Laterality Date  . ABDOMINAL AORTAGRAM N/A 06/28/2012   Procedure: ABDOMINAL Maxcine Ham;  Surgeon: Laverda Page, MD;  Location: [facility] C[facility] LAB;  Service: Cardiovascular;  Laterality: N/A;  . APPENDECTOMY    . BACK SURGERY     x 2, lower back  . BILATERAL CARPAL TUNNEL RELEASE    . CARDIAC CATHETERIZATION    . CESAREAN SECTION  1974; 1976; 1978  . CHOLECYSTECTOMY N/A 10/12/2013   Procedure: LAPAROSCOPIC CHOLECYSTECTOMY WITH INTRAOPERATIVE CHOLANGIOGRAM;  Surgeon: [name]. Georgette Dover, MD;  Location: [place];  Service: General;  Laterality: N/A;  . COLONOSCOPY    . FEMORAL ARTERY STENT Left 06/28/2012   SFA  . LAPAROSCOPIC CHOLECYSTECTOMY  10/12/2013  . LEFT HEART CATHETERIZATION WITH CORONARY ANGIOGRAM N/A 06/28/2012   Procedure: LEFT HEART CATHETERIZATION WITH CORONARY ANGIOGRAM;  Surgeon: Laverda Page, MD;  Location: [facility] CATH LAB;  Service: Cardiovascular;  Laterality: N/A;  . LOWER EXTREMITY ANGIOGRAM N/A 06/28/2012   Procedure: LOWER EXTREMITY ANGIOGRAM;  Surgeon: Laverda Page, MD;  Location: [place] CATH LAB;  Service: Cardiovascular;  Laterality: N/A;  . PERIPHERAL VASCULAR CATHETERIZATION N/A 09/10/2015   Procedure: Abdominal Aortogram w/Lower Extremity;  Surgeon: Adrian Prows, MD;  Location: [place] CV LAB;  Service: Cardiovascular;  Laterality: N/A;  . POSTERIOR LUMBAR FUSION    . REPLACEMENT TOTAL KNEE Left   . TONSILLECTOMY    . TUBAL LIGATION  1978     Current Outpatient Medications  Medication Sig Dispense Refill  . albuterol (PROAIR HFA) 108 (90 BASE) MCG/ACT inhaler Inhale 1 puff into the lungs every 6 (six) hours as needed for wheezing  or shortness of breath. ProAir     . albuterol (PROVENTIL) (2.5 MG/3ML) 0.083% nebulizer solution Take 2.5 mg by nebulization every 6 (six) hours as needed for wheezing or shortness of breath.     Marland Kitchen aspirin EC 81 MG tablet Take 81 mg by mouth daily.     . budesonide-formoterol (SYMBICORT) 160-4.5 MCG/ACT inhaler Inhale 2 puffs into the lungs 2 (two) times daily.    . clopidogrel (PLAVIX) 75 MG tablet Take 75 mg by mouth daily.   1  . dicyclomine (BENTYL) 20 MG tablet Take 20 mg by mouth 4 (four) times daily as needed for spasms.     Marland Kitchen diltiazem (CARDIZEM CD) 180 MG 24 hr capsule Take 1 capsule (180 mg total) by mouth daily. 30 capsule 0  . ezetimibe (ZETIA) 10 MG tablet Take 10 mg by mouth daily with supper.    . famotidine (PEPCID) 40 MG tablet Take 40 mg by mouth 2 (two) times daily.    . ferrous sulfate 325 (65 FE) MG tablet Take 325 mg by mouth daily with breakfast.     . HUMALOG MIX 75/25 KWIKPEN (75-25) 100 UNIT/ML Kwikpen Inject 22 Units into the skin 2 (two) times daily with a meal.   2  . HYDROcodone-acetaminophen (NORCO) 10-325 MG tablet TK 1 T PO QID PRN P    . lisinopril-hydrochlorothiazide (PRINZIDE,ZESTORETIC) 20-12.5 MG per tablet Take 1 tablet by mouth daily.     . meloxicam (MOBIC) 15 MG tablet TAKE 1 TABLET BY MOUTH DAILY  0  . pregabalin (LYRICA) 200 MG capsule Take 200 mg by mouth 3 (three) times daily.    . promethazine (PHENERGAN) 25 MG tablet Take 25 mg by mouth every 6 (six) hours as needed for nausea or vomiting.     . rosuvastatin (CRESTOR) 10 MG tablet Take 10 mg by mouth at bedtime.     Marland Kitchen tiZANidine (ZANAFLEX) 4 MG tablet Take 4 mg by mouth every 6 (six) hours.    . Vitamin D, Ergocalciferol, (DRISDOL) 50000 UNITS CAPS capsule Take 50,000 Units by mouth every Tuesday.      No current facility-administered medications for this visit.     Allergies:   Coconut oil    Social History:  The patient  reports that she quit smoking about 18 months ago. Her smoking use  included cigarettes. She started smoking about 16 years ago. She has a 23.00 pack-year smoking history. she has never used smokeless tobacco. She reports that she does not drink alcohol or use drugs.   Family History:  The patient's family history includes Cancer in her brother and father; Diabetes in her mother; Kidney disease in her brother and mother.    ROS:  Please see the history of present illness.   Otherwise, review of systems are positive for none.   All other systems are reviewed and negative.    PHYSICAL EXAM: VS:  There were no vitals taken for this visit. , BMI There is no height or weight on file to calculate BMI. GEN: Well nourished, well developed, in no acute distress  HEENT: normal  Neck: no JVD, carotid bruits, or masses Cardiac: RRR; no murmurs, rubs, or gallops,no edema  Respiratory:  clear to auscultation bilaterally, normal work of breathing GI: soft, nontender, nondistended, + BS MS: no deformity or atrophy  Skin: warm and dry, no rash Neuro:  Strength and sensation are intact Psych: euthymic mood, full affect Vascular: Femoral pulses +2 bilaterally.  Distal pulses are palpable but diminished.   EKG:  EKG is ordered today. The ekg ordered today demonstrates normal sinus rhythm with nonspecific anterior T wave changes   Recent Labs: 08/16/2016: ALT 13; BUN 23; Creatinine, Ser 1.14; Hemoglobin 10.9; Platelets 220; Potassium 3.8; Sodium 142    Lipid Panel    Component Value Date/Time   CHOL 113 02/07/2016 0659   TRIG 174 (H) 02/07/2016 0659   HDL 30 (L) 02/07/2016 0659   CHOLHDL 3.8 02/07/2016 0659   VLDL 35 02/07/2016 0659   LDLCALC 48 02/07/2016 0659      Wt Readings from Last 3 Encounters:  03/16/17 234 lb (106.1 kg)  09/28/16 227 lb 6.4 oz (103.1 kg)  08/16/16 221 lb (100.2 kg)       No flowsheet data found.    ASSESSMENT AND PLAN:  1.  Peripheral arterial disease: Previous history of left SFA stenting which was patent on angiogram in  2017.  She complains of atypical right leg claudication.  I requested a follow-up lower extremity arterial Doppler for evaluation. I reviewed her angiogram from 2017 which showed mild to moderate nonobstructive disease.  2.  Atypical chest pain: Multiple risk factors for coronary artery disease with slightly abnormal EKG showing nonspecific anterior T wave changes.  I requested a pharmacologic nuclear stress test for evaluation.  She is not able to exercise on a treadmill.  3.  Exertional dyspnea: Could be due to chronic diastolic heart failure as well as physical deconditioning.  It appears that she gained about 13 pounds since April.  I requested an echocardiogram.  4.  Hyperlipidemia: Continue treatment with rosuvastatin with a target LDL of less than 70.    Disposition:   FU with me after testing  Signed,  Kathlyn Sacramento, MD  04/20/2017 9:23 AM    [place]

## 2017-05-14 ENCOUNTER — Telehealth (HOSPITAL_COMMUNITY): Payer: Self-pay

## 2017-05-14 ENCOUNTER — Telehealth: Payer: Self-pay | Admitting: Cardiovascular Disease

## 2017-05-14 NOTE — Telephone Encounter (Signed)
Encounter complete. 

## 2017-05-14 NOTE — Telephone Encounter (Signed)
Patient returning your call.

## 2017-05-19 ENCOUNTER — Telehealth (HOSPITAL_COMMUNITY): Payer: Self-pay

## 2017-05-19 NOTE — Telephone Encounter (Signed)
Encounter complete. 

## 2017-05-20 ENCOUNTER — Ambulatory Visit (HOSPITAL_COMMUNITY)
Admission: RE | Admit: 2017-05-20 | Payer: Medicare Other | Source: Ambulatory Visit | Attending: Cardiovascular Disease | Admitting: Cardiovascular Disease

## 2017-05-21 ENCOUNTER — Ambulatory Visit (HOSPITAL_COMMUNITY)
Admission: RE | Admit: 2017-05-21 | Payer: Medicare Other | Source: Ambulatory Visit | Attending: Cardiovascular Disease | Admitting: Cardiovascular Disease

## 2017-08-26 ENCOUNTER — Ambulatory Visit: Payer: Medicare Other | Admitting: Podiatry

## 2017-08-27 ENCOUNTER — Encounter (HOSPITAL_COMMUNITY): Payer: Self-pay | Admitting: Emergency Medicine

## 2017-08-27 ENCOUNTER — Ambulatory Visit (HOSPITAL_COMMUNITY)
Admission: EM | Admit: 2017-08-27 | Discharge: 2017-08-27 | Disposition: A | Discharge status: Home / Self Care | Payer: Medicare Other | Attending: Physician Assistant | Admitting: Physician Assistant

## 2017-08-27 DIAGNOSIS — J0140 Acute pansinusitis, unspecified: Secondary | ICD-10-CM | POA: Diagnosis not present

## 2017-08-27 MED ORDER — BENZONATATE 100 MG PO CAPS: 100.0000 mg | ORAL_CAPSULE | Freq: Three times a day (TID) | ORAL | 0 refills | Status: DC

## 2017-08-27 MED ORDER — OLOPATADINE HCL 0.2 % OP SOLN: 1.0000 [drp] | Freq: Every day | OPHTHALMIC | 0 refills | Status: AC

## 2017-08-27 MED ORDER — CETIRIZINE-PSEUDOEPHEDRINE ER 5-120 MG PO TB12: 1.0000 | ORAL_TABLET | Freq: Every day | ORAL | 0 refills | Status: DC

## 2017-08-27 MED ORDER — DOXYCYCLINE HYCLATE 100 MG PO CAPS: 100.0000 mg | ORAL_CAPSULE | Freq: Two times a day (BID) | ORAL | 0 refills | Status: DC

## 2017-08-27 NOTE — Discharge Instructions (Signed)
As discussed symptoms could be due to allergies as well, but also treating your for sinus infection. Start doxycycline as directed. Tesslaon for cough. Zyrtec-D for nasal drainage/discharge/allergies. You can add on benadryl at night as needed.  You can use over the counter nasal saline rinse such as neti pot for nasal congestion. Keep hydrated, your urine should be clear to pale yellow in color. Tylenol/motrin for fever and pain. Monitor for any worsening of symptoms, chest pain, shortness of breath, wheezing, swelling of the throat, follow up for reevaluation.   For sore throat/cough try using a honey-based tea. Use 3 teaspoons of honey with juice squeezed from half lemon. Place shaved pieces of ginger into 1/2-1 cup of water and warm over stove top. Then mix the ingredients and repeat every 4 hours as needed.  Pataday eye drop to help with your itchy eye. Artificial tear gel at night. Wait 10-15 minutes between drops, always use artificial tear gel last, as it prevents drops from penetrating through. Lid scrubs and warm compresses as directed. This should help with your vision if symptoms are allergy related. Monitor for any worsening of symptoms, changes in vision, sensitivity to light, eye swelling, painful eye movement, follow up with ophthalmology for further evaluation.

## 2017-08-27 NOTE — ED Provider Notes (Signed)
[place]    CSN: [id number] Arrival date & time: 08/27/17  1506     History   Chief Complaint Chief Complaint  Patient presents with  . Allergies    HPI Christina Solis is a 67 y.o. female.   67 year old female comes in for 2-week history of URI symptoms.  States first started out with sneezing, watery eyes/itchy eyes, nasal congestion/rhinorrhea, and thought it was her allergies.  She took Benadryl with some relief.  Has also been having cough that is worsening.  States she switch her medication to Zyrtec with some relief initially.  Denies fever, chills, night sweats.  States left eye has been watering, red, itchy.  Has had some blurry vision without photophobia. Denies chest pain, shortness of breath. Has had intermittent wheezing.      Past Medical History:  Diagnosis Date  . Anemia    sickle cell trait  . Anxiety   . Arthritis    "knees" (10/12/2013)  . CHF (congestive heart failure) ([place])   . Chronic back pain   . Chronic bronchitis ([place])    "got it q yr when I lived in [place]"  . Complication of anesthesia    hard to put under  . COPD (chronic obstructive pulmonary disease) ([place])   . Dysrhythmia   . Family history of anesthesia complication    sister hard to wake up  . Family history of anesthesia complication    niece have itching  . Fibromyalgia   . GERD (gastroesophageal reflux disease)   . Hyperlipidemia   . Hypertension   . Irregular heart beat   . Migraine    "used to get them alot; not regular anymore" (10/12/2013)  . PAD (peripheral artery disease) ([place])   . Pituitary tumor    followed at [facility] Endocrinology; on cabergoline 09/2013  . Pneumonia    "twice"  . Type II diabetes mellitus [facility])     Patient Active Problem List   Diagnosis Date Noted  . COPD exacerbation ([place])   . Wheezing   . Atrial tachycardia ([place])   . Left bundle branch block   . Chronic diastolic CHF (congestive heart failure) ([place])   . Uncontrolled type 2  diabetes mellitus with complication ([place])   . COPD with acute exacerbation ([place]) 02/02/2016  . CAP (community acquired pneumonia) 02/02/2016  . Incidental lung nodule, > 65mm and < 70mm 02/02/2016  . CKD (chronic kidney disease), stage III ([place]) 02/02/2016  . Pain in the chest   . Acute respiratory failure ([place]) 02/01/2016  . Anemia, unspecified 04/08/2015  . Hemorrhoids 04/08/2015  . Osteoarthritis of right knee 04/05/2015  . HTN (hypertension) 04/05/2015  . Neuropathy in diabetes ([place]) 04/05/2015  . Hyperlipidemia 04/05/2015  . Pain and swelling of left lower leg 04/05/2015  . Carpal tunnel syndrome of left wrist 12/18/2014  . Pain in left knee 12/18/2014  . Vitamin D deficiency 11/29/2014  . Hyperlipidemia associated with type 2 diabetes mellitus ([place]) 08/31/2014  . Chronic cholecystitis with calculus 09/15/2013  . Disorder of synovium, tendon, and bursa 09/14/2013  . Feces contents abnormal 09/14/2013  . Former smoker 09/14/2013  . Morbid obesity with BMI of 40.0-44.9, adult ([place]) 09/06/2013  . 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 Constipation 11/08/2012  . Insulin dependent diabetes mellitus ([place]) 06/28/2012  . PAD (peripheral artery disease) ([place]) 06/28/2012  . Intermittent claudication ([place]) 06/28/2012  . Fibromyalgia 06/20/2012  . Shoulder pain 06/20/2012  . Lumbar postlaminectomy  syndrome 06/20/2012  . Chronic pain 04/20/2012  . Migraine 03/31/2012    Past Surgical History:  Procedure Laterality Date  . ABDOMINAL AORTAGRAM N/A 06/28/2012   Procedure: ABDOMINAL Maxcine Ham;  Surgeon: Laverda Page, MD;  Location: [facility] CATH LAB;  Service: Cardiovascular;  Laterality: N/A;  . APPENDECTOMY    . BACK SURGERY     x 2, lower back  . BILATERAL CARPAL TUNNEL RELEASE    . CARDIAC CATHETERIZATION    . CESAREAN SECTION  1974; 1976; 1978  . CHOLECYSTECTOMY N/A 10/12/2013   Procedure: LAPAROSCOPIC CHOLECYSTECTOMY WITH INTRAOPERATIVE  CHOLANGIOGRAM;  Surgeon: [name]. Georgette Dover, MD;  Location: [place];  Service: General;  Laterality: N/A;  . COLONOSCOPY    . FEMORAL ARTERY STENT Left 06/28/2012   SFA  . LAPAROSCOPIC CHOLECYSTECTOMY  10/12/2013  . LEFT HEART CATHETERIZATION WITH CORONARY ANGIOGRAM N/A 06/28/2012   Procedure: LEFT HEART CATHETERIZATION WITH CORONARY ANGIOGRAM;  Surgeon: Laverda Page, MD;  Location: [facility] CATH LAB;  Service: Cardiovascular;  Laterality: N/A;  . LOWER EXTREMITY ANGIOGRAM N/A 06/28/2012   Procedure: LOWER EXTREMITY ANGIOGRAM;  Surgeon: Laverda Page, MD;  Location: [facility] CATH LAB;  Service: Cardiovascular;  Laterality: N/A;  . PERIPHERAL VASCULAR CATHETERIZATION N/A 09/10/2015   Procedure: Abdominal Aortogram w/Lower Extremity;  Surgeon: Adrian Prows, MD;  Location: [place] CV LAB;  Service: Cardiovascular;  Laterality: N/A;  . POSTERIOR LUMBAR FUSION    . REPLACEMENT TOTAL KNEE Left   . TONSILLECTOMY    . TUBAL LIGATION  1978    OB History   None      Home Medications    Prior to Admission medications   Medication Sig Start Date End Date Taking? Authorizing Provider  albuterol (PROAIR HFA) 108 (90 BASE) MCG/ACT inhaler Inhale 1 puff into the lungs every 6 (six) hours as needed for wheezing or shortness of breath. ProAir     [provider]  albuterol (PROVENTIL) (2.5 MG/3ML) 0.083% nebulizer solution Take 2.5 mg by nebulization every 6 (six) hours as needed for wheezing or shortness of breath.  06/30/15   [provider]  aspirin EC 81 MG tablet Take 81 mg by mouth daily.     [provider]  benzonatate (TESSALON) 100 MG capsule Take 1 capsule (100 mg total) by mouth every 8 (eight) hours. 08/27/17   Tasia Catchings, Loyd Salvador V, PA-C  budesonide-formoterol (SYMBICORT) 160-4.5 MCG/ACT inhaler Inhale 2 puffs into the lungs 2 (two) times daily.    [provider]  cetirizine-pseudoephedrine (ZYRTEC-D) 5-120 MG tablet Take 1 tablet by mouth daily. 08/27/17   Tasia Catchings, Irish Piech V, PA-C    clopidogrel (PLAVIX) 75 MG tablet Take 75 mg by mouth daily.  08/15/15   [provider]  dicyclomine (BENTYL) 20 MG tablet Take 20 mg by mouth 4 (four) times daily as needed for spasms.  09/06/13   [provider]  diltiazem (CARDIZEM CD) 180 MG 24 hr capsule Take 1 capsule (180 mg total) by mouth daily. 02/09/16   Jani Gravel, MD  doxycycline (VIBRAMYCIN) 100 MG capsule Take 1 capsule (100 mg total) by mouth 2 (two) times daily. 08/27/17   Tasia Catchings, Aedyn Kempfer V, PA-C  ezetimibe (ZETIA) 10 MG tablet Take 10 mg by mouth daily with supper.    [provider]  famotidine (PEPCID) 40 MG tablet Take 40 mg by mouth 2 (two) times daily.    [provider]  ferrous sulfate 325 (65 FE) MG tablet Take 325 mg by mouth daily with breakfast.  07/30/15   [provider]  HUMALOG MIX 75/25 KWIKPEN (75-25) 100 UNIT/ML Kwikpen Inject 22 Units into the skin 2 (two) times daily with a meal.  08/08/15   [provider]  HYDROcodone-acetaminophen (NORCO) 10-325 MG tablet TK 1 T PO QID PRN P 01/28/17   [provider]  lisinopril-hydrochlorothiazide (PRINZIDE,ZESTORETIC) 20-12.5 MG per tablet Take 1 tablet by mouth daily.     [provider]  meloxicam (MOBIC) 15 MG tablet TAKE 1 TABLET BY MOUTH DAILY 09/26/14   [provider]  Olopatadine HCl 0.2 % SOLN Apply 1 drop to eye daily. 08/27/17   Tasia Catchings, Nayomi Tabron V, PA-C  pregabalin (LYRICA) 200 MG capsule Take 200 mg by mouth 3 (three) times daily.    [provider]  promethazine (PHENERGAN) 25 MG tablet Take 25 mg by mouth every 6 (six) hours as needed for nausea or vomiting.  07/30/15   [provider]  rosuvastatin (CRESTOR) 10 MG tablet Take 10 mg by mouth at bedtime.  09/02/15   [provider]  tiZANidine (ZANAFLEX) 4 MG tablet Take 4 mg by mouth every 6 (six) hours. 07/24/16   [provider]  traMADol (ULTRAM) 50 MG tablet Take by mouth. 08/18/16   [provider]   umeclidinium bromide (INCRUSE ELLIPTA) 62.5 MCG/INH AEPB Inhale into the lungs. 06/29/16   [provider]  Vitamin D, Ergocalciferol, (DRISDOL) 50000 UNITS CAPS capsule Take 50,000 Units by mouth every Tuesday.     [provider]    Family History Family History  Problem Relation Age of Onset  . Diabetes Mother   . Kidney disease Mother   . Cancer Father   . Kidney disease Brother   . Cancer Brother     Social History Social History   Tobacco Use  . Smoking status: Former Smoker    Packs/day: 0.50    Years: 46.00    Pack years: 23.00    Types: Cigarettes    Start date: 06/20/2000    Last attempt to quit: 09/29/2015    Years since quitting: 1.9  . Smokeless tobacco: Never Used  Substance Use Topics  . 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 Drug use: No     Allergies   Coconut oil   Review of Systems Review of Systems  Reason unable to perform ROS: See HPI as above.     Physical Exam Triage Vital Signs ED Triage Vitals [08/27/17 1543]  Enc Vitals Group     BP (!) 154/61     Pulse Rate 73     Resp 18     Temp 98.8 F (37.1 C)     Temp Source Oral     SpO2 95 %     Weight      Height      Head Circumference      Peak Flow      Pain Score      Pain Loc      Pain Edu?      Excl. in [place]?    No data found.  Updated Vital Signs BP (!) 154/61 (BP Location: Left Arm)   Pulse 73   Temp 98.8 F (37.1 C) (Oral)   Resp 18   SpO2 95%   Visual Acuity Right Eye Distance: 20/30 Left Eye Distance: 20/30 Bilateral Distance: 20/30  Right Eye Near:   Left Eye Near:    Bilateral Near:     Physical Exam  Constitutional: She is oriented to person, place, and time. She appears well-developed and well-nourished. No distress.  HENT:  Head: Normocephalic and atraumatic.  Right Ear: Tympanic membrane, external ear and ear canal normal. Tympanic membrane is not erythematous and not bulging.  Left Ear: Tympanic membrane,  external ear and ear canal normal. Tympanic membrane is not erythematous and not bulging.  Nose: Right sinus exhibits maxillary sinus tenderness and frontal sinus tenderness. Left sinus exhibits maxillary sinus tenderness and frontal sinus tenderness.  Mouth/Throat: Uvula is midline, oropharynx is clear and moist and mucous membranes are normal.  Eyes: Pupils are equal, round, and reactive to light. EOM and lids are normal. Right conjunctiva is not injected. Left conjunctiva is injected.  No ciliary injection.   Neck: Normal range of motion. Neck supple.  Cardiovascular: Normal rate, regular rhythm and normal heart sounds. Exam reveals no gallop and no friction rub.  No murmur heard. Pulmonary/Chest: Effort normal and breath sounds normal. No accessory muscle usage or stridor. No respiratory distress. She has no decreased breath sounds. She has no wheezes. She has no rhonchi. She has no rales.  Lymphadenopathy:    She has no cervical adenopathy.  Neurological: She is alert and oriented to person, place, and time.  Skin: Skin is warm and dry.  Psychiatric: She has a normal mood and affect. Her behavior is normal. Judgment normal.     UC Treatments / Results  Labs (all labs ordered are listed, but only abnormal results are displayed) Labs Reviewed - No data to display  EKG None Radiology No results found.  Procedures Procedures (including critical care time)  Medications Ordered in UC Medications - No data to display   Initial Impression / Assessment and Plan / UC Course  I have reviewed the triage vital signs and the nursing notes.  Pertinent labs & imaging results that were available during my care of the patient were reviewed by me and considered in my medical decision making (see chart for details).    Will cover for sinusitis/bronchitis with doxycycline given history and exam. Symptomatic treatment provided. Discussed with patient symptoms could also be allergy related.  pataday eye drop for possible allergic conjunctivitis. Lid scrubs/warm compress. Push fluids. Return precautions given. Patient expresses understanding and agrees to plan.   Final Clinical Impressions(s) / UC Diagnoses   Final diagnoses:  Acute non-recurrent pansinusitis    ED Discharge Orders        Ordered    cetirizine-pseudoephedrine (ZYRTEC-D) 5-120 MG tablet  Daily     08/27/17 1624    doxycycline (VIBRAMYCIN) 100 MG capsule  2 times daily     08/27/17 1624    benzonatate (TESSALON) 100 MG capsule  Every 8 hours     08/27/17 1624    Olopatadine HCl 0.2 % SOLN  Daily     08/27/17 1624        Ok Edwards, PA-C 08/27/17 1835

## 2017-08-27 NOTE — ED Triage Notes (Signed)
Pt c/o worsening allergies, has tried benadryl and zyrtec without relief.

## 2017-09-09 ENCOUNTER — Ambulatory Visit: Payer: Medicare Other | Admitting: Podiatry

## 2017-09-23 ENCOUNTER — Ambulatory Visit: Payer: Medicare Other | Admitting: Podiatry

## 2019-01-19 ENCOUNTER — Encounter: Payer: Self-pay | Admitting: Gastroenterology

## 2019-02-21 ENCOUNTER — Ambulatory Visit: Payer: Medicare Other | Admitting: Gastroenterology

## 2019-03-22 ENCOUNTER — Other Ambulatory Visit: Payer: Self-pay

## 2019-03-22 ENCOUNTER — Telehealth: Payer: Self-pay

## 2019-03-22 ENCOUNTER — Ambulatory Visit (INDEPENDENT_AMBULATORY_CARE_PROVIDER_SITE_OTHER): Payer: Medicare Other | Admitting: Gastroenterology

## 2019-03-22 ENCOUNTER — Encounter: Payer: Self-pay | Admitting: Gastroenterology

## 2019-03-22 VITALS — BP 156/74 | HR 64 | Temp 97.4°F | Ht 63.0 in | Wt 228.0 lb

## 2019-03-22 DIAGNOSIS — Z7902 Long term (current) use of antithrombotics/antiplatelets: Secondary | ICD-10-CM | POA: Diagnosis not present

## 2019-03-22 DIAGNOSIS — R1319 Other dysphagia: Secondary | ICD-10-CM | POA: Insufficient documentation

## 2019-03-22 DIAGNOSIS — R1310 Dysphagia, unspecified: Secondary | ICD-10-CM | POA: Insufficient documentation

## 2019-03-22 DIAGNOSIS — Z860100 Personal history of colon polyps, unspecified: Secondary | ICD-10-CM | POA: Insufficient documentation

## 2019-03-22 DIAGNOSIS — K219 Gastro-esophageal reflux disease without esophagitis: Secondary | ICD-10-CM | POA: Insufficient documentation

## 2019-03-22 DIAGNOSIS — Z1159 Encounter for screening for other viral diseases: Secondary | ICD-10-CM | POA: Insufficient documentation

## 2019-03-22 MED ORDER — SUPREP BOWEL PREP KIT 17.5-3.13-1.6 GM/177ML PO SOLN: 1.0000 | ORAL | 0 refills | Status: DC

## 2019-03-22 MED ORDER — OMEPRAZOLE 40 MG PO CPDR: 40.0000 mg | DELAYED_RELEASE_CAPSULE | Freq: Every day | ORAL | 2 refills | Status: DC

## 2019-03-22 NOTE — Patient Instructions (Signed)
We have sent the following medications to your pharmacy for you to pick up at your convenience: Omeprazole   You have been scheduled for an endoscopy and colonoscopy. Please follow the written instructions given to you at your visit today. Please pick up your prep supplies at the pharmacy within the next 1-3 days. If you use inhalers (even only as needed), please bring them with you on the day of your procedure.   We will contact Dr. Ronnald Ramp regarding holding your plavix for 5 days prior to procedure.   If you are age 66 or older, your body mass index should be between 23-30. Your Body mass index is 40.39 kg/m. If this is out of the aforementioned range listed, please consider follow up with your Primary Care Provider.  Thank you for choosing me and [facility].  Dr. Rush Landmark

## 2019-03-22 NOTE — Progress Notes (Signed)
[place] VISIT   Primary Care Provider [name] [address] [phone]  Referring Provider [name], NP [place],  [place] [address] [phone]  Patient Profile: Christina Solis is a 68 y.o. female with a pmh significant for CHF, COPD, GERD, HTN, HLD, PAD (on Plavix), DM, Obesity, s/p CCK.  The patient presents to the [facility] for an evaluation and management of problem(s) noted below:  Problem List 1. Hx of colonic polyps   2. Gastroesophageal reflux disease, unspecified whether esophagitis present   3. Esophageal dysphagia   4. Encounter for current long term use of antiplatelet drug   5. Screening for viral disease     History of Present Illness This is the patient's first visit to the outpatient [place] clinic.  The patient has a history of a 2011 colonoscopy where she had 2 polyps removed.  She was unable to get the actual pathology or report but she was told to have a 5-year follow-up.  She moved from [place] to [place] in 2012 but had not establish with a gastroenterologist.  She has had longstanding heartburn since 2007.  She was previously on a PPI and then transition to Zantac and eventually after Zantac was taken off the market to Pepcid.  She now takes it once daily.  Patient does have dysphagia symptoms to solid foods including meats and rice and breads.  This has occurred at least 2-3 times in the last 2 weeks.  She has had to regurgitate the food she just ingested.  At times she will also feel like food gets caught point where she will try and drink and see if it will pass.  She has had longer standing issues with bloating and gas which she is attributed to her constipation.  She does use the restroom on a daily or every other day basis.  She has a father who passed away in his 63s from pancreas cancer.  There is no other family history of GI  malignancies.  The patient has had a prior endoscopy but has been many years most likely around 2007 since she establish a diagnosis of GERD.  She does not take significant nonsteroidals or BC Goody powders.  GI Review of Systems Positive as above Negative for odynophagia, pain, nausea, vomiting, decreased appetite, change in bowel movements, melena, hematochezia  Review of Systems General: Denies fevers/chills/unintentional weight loss HEENT: Denies oral lesions Cardiovascular: Denies chest pain/palpitations Pulmonary: Denies shortness of breath Gastroenterological: See HPI Genitourinary: Denies darkened urine or hematuria Hematological: Denies easy bruising/bleeding Endocrine: Denies temperature intolerance Dermatological: Denies jaundice Psychological: Mood is stable   Medications Current Outpatient Medications  Medication Sig Dispense Refill   albuterol (PROAIR HFA) 108 (90 BASE) MCG/ACT inhaler Inhale 1 puff into the lungs every 6 (six) hours as needed for wheezing or shortness of breath. ProAir      albuterol (PROVENTIL) (2.5 MG/3ML) 0.083% nebulizer solution Take 2.5 mg by nebulization every 6 (six) hours as needed for wheezing or shortness of breath.      aspirin EC 81 MG tablet Take 81 mg by mouth daily.      benzonatate (TESSALON) 100 MG capsule Take 1 capsule (100 mg total) by mouth every 8 (eight) hours. 21 capsule 0   budesonide-formoterol (SYMBICORT) 160-4.5 MCG/ACT inhaler Inhale 2 puffs into the lungs 2 (two) times daily.     cetirizine-pseudoephedrine (ZYRTEC-D) 5-120 MG  tablet Take 1 tablet by mouth daily. 15 tablet 0   clopidogrel (PLAVIX) 75 MG tablet Take 75 mg by mouth daily.   1   dicyclomine (BENTYL) 20 MG tablet Take 20 mg by mouth 4 (four) times daily as needed for spasms.      diltiazem (CARDIZEM CD) 180 MG 24 hr capsule Take 1 capsule (180 mg total) by mouth daily. 30 capsule 0   doxycycline (VIBRAMYCIN) 100 MG capsule Take 1 capsule (100 mg total)  by mouth 2 (two) times daily. 20 capsule 0   ezetimibe (ZETIA) 10 MG tablet Take 10 mg by mouth daily with supper.     famotidine (PEPCID) 40 MG tablet Take 40 mg by mouth 2 (two) times daily.     ferrous sulfate 325 (65 FE) MG tablet Take 325 mg by mouth daily with breakfast.      HUMALOG MIX 75/25 KWIKPEN (75-25) 100 UNIT/ML Kwikpen Inject 22 Units into the skin 2 (two) times daily with a meal.   2   HYDROcodone-acetaminophen (NORCO) 10-325 MG tablet TK 1 T PO QID PRN P     lisinopril-hydrochlorothiazide (PRINZIDE,ZESTORETIC) 20-12.5 MG per tablet Take 1 tablet by mouth daily.      lubiprostone (AMITIZA) 24 MCG capsule Take 24 mcg by mouth 2 (two) times daily with a meal.     meloxicam (MOBIC) 15 MG tablet TAKE 1 TABLET BY MOUTH DAILY  0   Olopatadine HCl 0.2 % SOLN Apply 1 drop to eye daily. 2.5 mL 0   polyethylene glycol (MIRALAX / GLYCOLAX) 17 g packet Take 17 g by mouth daily.     pregabalin (LYRICA) 200 MG capsule Take 200 mg by mouth 3 (three) times daily.     promethazine (PHENERGAN) 25 MG tablet Take 25 mg by mouth every 6 (six) hours as needed for nausea or vomiting.      rosuvastatin (CRESTOR) 10 MG tablet Take 10 mg by mouth at bedtime.      tiZANidine (ZANAFLEX) 4 MG tablet Take 4 mg by mouth every 6 (six) hours.     traMADol (ULTRAM) 50 MG tablet Take by mouth.     umeclidinium bromide (INCRUSE ELLIPTA) 62.5 MCG/INH AEPB Inhale into the lungs.     Vitamin D, Ergocalciferol, (DRISDOL) 50000 UNITS CAPS capsule Take 50,000 Units by mouth every Tuesday.      Na Sulfate-K Sulfate-Mg Sulf (SUPREP BOWEL PREP KIT) 17.5-3.13-1.6 GM/177ML SOLN Take 1 kit by mouth as directed. For colonoscopy prep 354 mL 0   omeprazole (PRILOSEC) 40 MG capsule Take 1 capsule (40 mg total) by mouth daily. 30 capsule 2   No current facility-administered medications for this visit.     Allergies Allergies  Allergen Reactions   Coconut Oil Hives    Histories Past Medical History:    Diagnosis Date   Anemia    sickle cell trait   Anxiety    Arthritis    "knees" (10/12/2013)   CHF (congestive heart failure) (HCC)    Chronic back pain    Chronic bronchitis ([place])    "got it q yr when I lived in [place]"   Complication of anesthesia    hard to put under   COPD (chronic obstructive pulmonary disease) ([place])    Dysrhythmia    Family history of anesthesia complication    sister hard to wake up   Family history of anesthesia complication    niece have itching   Fibromyalgia    GERD (gastroesophageal reflux disease)  Hyperlipidemia    Hypertension    Irregular heart beat    Migraine    "used to get them alot; not regular anymore" (10/12/2013)   PAD (peripheral artery disease) [facility])    Pituitary tumor    followed at [facility]; on cabergoline 09/2013   Pneumonia    "twice"   Type II diabetes mellitus [facility])    Past Surgical History:  Procedure Laterality Date   ABDOMINAL AORTAGRAM N/A 06/28/2012   Procedure: ABDOMINAL Maxcine Ham;  Surgeon: Laverda Page, MD;  Location: [facility] CATH LAB;  Service: Cardiovascular;  Laterality: N/A;   APPENDECTOMY     BACK SURGERY     x 2, lower back   BILATERAL CARPAL TUNNEL RELEASE     CARDIAC CATHETERIZATION     CESAREAN SECTION  1974; 1976; [place] N/A 10/12/2013   Procedure: LAPAROSCOPIC CHOLECYSTECTOMY WITH INTRAOPERATIVE CHOLANGIOGRAM;  Surgeon: [name]. Georgette Dover, MD;  Location: [place];  Service: General;  Laterality: N/A;   COLONOSCOPY     FEMORAL ARTERY STENT Left 06/28/2012   SFA   LAPAROSCOPIC CHOLECYSTECTOMY  10/12/2013   LEFT HEART CATHETERIZATION WITH CORONARY ANGIOGRAM N/A 06/28/2012   Procedure: LEFT HEART CATHETERIZATION WITH CORONARY ANGIOGRAM;  Surgeon: Laverda Page, MD;  Location: [facility] CATH LAB;  Service: Cardiovascular;  Laterality: N/A;   LOWER EXTREMITY ANGIOGRAM N/A 06/28/2012   Procedure: LOWER EXTREMITY ANGIOGRAM;  Surgeon: Laverda Page, MD;   Location: [facility] CATH LAB;  Service: Cardiovascular;  Laterality: N/A;   PERIPHERAL VASCULAR CATHETERIZATION N/A 09/10/2015   Procedure: Abdominal Aortogram w/Lower Extremity;  Surgeon: Adrian Prows, MD;  Location: [facility] CV LAB;  Service: Cardiovascular;  Laterality: N/A;   POSTERIOR LUMBAR FUSION     REPLACEMENT TOTAL KNEE Left    TONSILLECTOMY     TUBAL LIGATION  1978   Social History   Socioeconomic History   Marital status: Divorced    Spouse name: Not on file   Number of children: 3   Years of education: 16   Highest education level: Not on file  Occupational History    Comment: retired Designer, multimedia strain: Not on file   Food insecurity    Worry: Not on file    Inability: Not on Lexicographer needs    Medical: Not on file    Non-medical: Not on file  Tobacco Use   Smoking status: Former Smoker    Packs/day: 0.50    Years: 46.00    Pack years: 23.00    Types: Cigarettes    Start date: 06/20/2000    Quit date: 09/29/2015    Years since quitting: 3.4   Smokeless tobacco: Never Used  Substance and Sexual Activity   Alcohol use: No    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"   Drug use: No   Sexual activity: Never  Lifestyle   Physical activity    Days per week: Not on file    Minutes per session: Not on file   Stress: Not on file  Relationships   Social connections    Talks on phone: Not on file    Gets together: Not on file    Attends religious service: Not on file    Active member of club or organization: Not on file    Attends meetings of clubs or organizations: Not on file    Relationship status: Not on file   Intimate partner violence    Fear of current or  ex partner: Not on file    Emotionally abused: Not on file    Physically abused: Not on file    Forced sexual activity: Not on file  Other Topics Concern   Not on file  Social History Narrative   Lives with daughter    caffeine- none   Family  History  Problem Relation Age of Onset   Diabetes Mother    Kidney disease Mother    Cancer Father    Pancreatic cancer Father    Kidney disease Brother    Cancer Brother    Colon cancer Neg Hx    Inflammatory bowel disease Neg Hx    Esophageal cancer Neg Hx    Liver disease Neg Hx    Rectal cancer Neg Hx    Stomach cancer Neg Hx    I have reviewed her medical, social, and family history in detail and updated the electronic medical record as necessary.    PHYSICAL EXAMINATION  BP (!) 156/74    Pulse 64    Temp (!) 97.4 F (36.3 C)    Ht 5' 3"  (1.6 m)    Wt 228 lb (103.4 kg)    BMI 40.39 kg/m  Wt Readings from Last 3 Encounters:  03/22/19 228 lb (103.4 kg)  03/16/17 234 lb (106.1 kg)  09/28/16 227 lb 6.4 oz (103.1 kg)  GEN: NAD, appears stated age, doesn't appear chronically ill PSYCH: Cooperative, without pressured speech EYE: Conjunctivae pink, sclerae anicteric ENT: MMM, without oral ulcers, no erythema or exudates noted NECK: Supple, enlarged neck girth CV: RR without R/Gs  RESP: CTAB posteriorly GI: NABS, soft, obese, rounded, ventral diastases present, without rebound or guarding, no HSM appreciated MSK/EXT: Trace bilateral lower extremity edema SKIN: No jaundice NEURO:  Alert & Oriented x 3, no focal deficits   REVIEW OF DATA  I reviewed the following data at the time of this encounter:  GI Procedures and Studies  These are outside records which she has been unable to relocate Reported 2007-2009 EGD which found acid reflux changes Reported 2011 colonoscopy with 2 polyps and told to have a 5-year follow-up  Laboratory Studies  Reviewed those in epic  Imaging Studies  No relevant studies to review   ASSESSMENT  Ms. Bolinger is a 68 y.o. female with a pmh significant for CHF, COPD, GERD, HTN, HLD, PAD (on Plavix), DM, Obesity, s/p CCK.  The patient is seen today for evaluation and management of:  1. Hx of colonic polyps   2. Gastroesophageal  reflux disease, unspecified whether esophagitis present   3. Esophageal dysphagia   4. Encounter for current long term use of antiplatelet drug   5. Screening for viral disease    The patient is hemodynamically stable.  She presents for consideration of surveillance colonoscopy while on antiplatelet therapy.  She describes a prior history of colon polyps in 2011 and although we do not have the pathology she was recommended a 5-year follow-up presumably because she has had adenomatous or precancerous polyps.  I think she is a reasonable candidate to continue colon polyp surveillance and colon cancer screening.  She will benefit from a surveillance colonoscopy.  In regards to her GERD symptoms and dysphagia she will benefit from an upper endoscopy with possible dilation.  We will transition her H2 RA blocker to a PPI moderate dose and see how she does with that.  We will plan to evaluate her upper GI tract and proceed with a dilation as long as she is  not having significant esophagitis at the time.  Manometry will be unlikely to be needed but based on her symptoms and her findings we will consider in the future.  She will need to be off Plavix for 5 days prior to her procedures and we will get permission from her primary care provider for this.  I will restart her antiplatelet therapy within 2 to 3 days based on the findings of her upper and lower endoscopy.  She is okay to maintain aspirin.  The risks and benefits of endoscopic evaluation were discussed with the patient; these include but are not limited to the risk of perforation, infection, bleeding, missed lesions, lack of diagnosis, severe illness requiring hospitalization, as well as anesthesia and sedation related illnesses.  The patient is agreeable to proceed.  The patient has a ventral diastases but it is not causing her significant issues and she does not require surgical management at this time.  All patient questions were answered, to the best of my  ability, and the patient agrees to the aforementioned plan of action with follow-up as indicated.   PLAN  Proceed with scheduling surveillance colonoscopy Proceed with scheduling diagnostic endoscopy with dilation and biopsies Stop Pepcid and transition to PPI 40 mg daily Manometry will be considered if dilation is performed and she continues to have dysphagia   Orders Placed This Encounter  Procedures   SARS Coronavirus 2 ([facility]/Gastro ONLY)   Ambulatory referral to Gastroenterology    New Prescriptions   NA SULFATE-K SULFATE-MG SULF (SUPREP BOWEL PREP KIT) 17.5-3.13-1.6 GM/177ML SOLN    Take 1 kit by mouth as directed. For colonoscopy prep   OMEPRAZOLE (PRILOSEC) 40 MG CAPSULE    Take 1 capsule (40 mg total) by mouth daily.   Modified Medications   No medications on file    Planned Follow Up No follow-ups on file.   Justice Britain, MD [facility] Office # [phone]

## 2019-03-24 ENCOUNTER — Encounter: Payer: Self-pay | Admitting: Pulmonary Disease

## 2019-04-03 ENCOUNTER — Telehealth: Payer: Self-pay

## 2019-04-03 NOTE — Telephone Encounter (Signed)
Thank you for update. GM 

## 2019-04-03 NOTE — Telephone Encounter (Signed)
See previous note regarding clearance.

## 2019-04-03 NOTE — Telephone Encounter (Signed)
We recd clearance back from [facility] given the okay to hold Plavix x5 days prior to procedure. Pt has been informed and voiced understanding.

## 2019-04-17 ENCOUNTER — Other Ambulatory Visit: Payer: Self-pay

## 2019-04-17 ENCOUNTER — Ambulatory Visit (INDEPENDENT_AMBULATORY_CARE_PROVIDER_SITE_OTHER): Payer: Medicare Other | Admitting: Pulmonary Disease

## 2019-04-17 ENCOUNTER — Encounter: Payer: Self-pay | Admitting: Pulmonary Disease

## 2019-04-17 VITALS — BP 128/74 | HR 71 | Ht 63.0 in | Wt 230.0 lb

## 2019-04-17 DIAGNOSIS — R918 Other nonspecific abnormal finding of lung field: Secondary | ICD-10-CM

## 2019-04-17 DIAGNOSIS — J449 Chronic obstructive pulmonary disease, unspecified: Secondary | ICD-10-CM

## 2019-04-17 DIAGNOSIS — IMO0001 Reserved for inherently not codable concepts without codable children: Secondary | ICD-10-CM

## 2019-04-17 DIAGNOSIS — R911 Solitary pulmonary nodule: Secondary | ICD-10-CM

## 2019-04-17 MED ORDER — SPIRIVA RESPIMAT 2.5 MCG/ACT IN AERS: 2.0000 | INHALATION_SPRAY | Freq: Every day | RESPIRATORY_TRACT | 0 refills | Status: DC

## 2019-04-17 MED ORDER — SPIRIVA RESPIMAT 2.5 MCG/ACT IN AERS: 2.0000 | INHALATION_SPRAY | Freq: Every day | RESPIRATORY_TRACT | 5 refills | Status: DC

## 2019-04-17 NOTE — Progress Notes (Signed)
Subjective:   PATIENT ID: Christina Solis GENDER: female DOB: 09-20-1950, MRN: [id number]   HPI  Chief Complaint  Patient presents with  . Consult    Pt here for a consult due to being diagnosed with COPD in 2009. Pt states she will occ have a cough and occ SOB when exerting herself.    Reason for Visit: New consult for COPD  [name] is a 68 year old female with reported COPD, DM2 with peripheral neuropathy, stage IIIB CKD, morbid obesity  Reviewed [facility] records from 03/20/19: In summary patient was seen for diabetes follow-up and routine labs for chronic medical conditions. She has a history of COPD and a lung nodule <12m. She has had issues with cough and is interested in seeing a pulmonologist for inhaler management.   She was diagnosed with COPD 9 years ago. She previously smoked 2ppd x 20 years and started cutting back in 2007 and finally quit 2017. [place] has been hospitalized three times for her COPD/pneumonia with the last hospitalization in 2017, but has never been intubated. She has history of bronchitis and has shortness of breath with exertion. She only has wheezing and coughing when she has bronchitis or pneumonia. Denies chronic cough. Activity and illness worsens her symptoms. Albuterol and prednisone improves her symptoms. She uses her Albuterol once a day but whenever she is sick, the severity of sickness is bad and she requires albuterol every 4-6 hours. Last year was her most recent exacerbation requiring steroids. She does not like using prednisone as it causes her blood sugar increases and causes her to feel giddy. She has been on Incruse in the past and only felt like it helped her symptoms somewhat.  Social History: Quit smoking 2017.  Retired nMarine scientistin hospital, hospice, nursing homes  I have personally reviewed patient's past medical/family/social history, allergies,  current medications.  Past Medical History:  Diagnosis Date  . Anemia    sickle cell trait  . Anxiety   . Arthritis    "knees" (10/12/2013)  . CHF ([place]estive heart failure) ([facility]   . Chronic back pain   . Chronic bronchitis ([facility]    "[place]  . Complication of anesthesia    hard to put under  . COPD (chronic obstructive pulmonary disease) ([facility]   . Dysrhythmia   . Family history of anesthesia complication    sister hard to wake up  . Family history of anesthesia complication    niece have itching  . Fibromyalgia   . GERD (gastroesophageal reflux disease)   . Hyperlipidemia   . Hypertension   . Irregular heart beat   . Migraine    "used to get them alot; not regular anymore" (10/12/2013)  . PAD (peripheral artery disease) ([facility]   . Pituitary tumor    [facility] on caberg[facility]/2013  . Pneumonia    "twice"  . Type II diabetes mellitus (HCC)      Family History  Problem Relation Age of Onset  . Diabetes Mother   . Kidney disease Mother   . Cancer Father   . Pancreatic cancer Father   . Kidney disease Brother   . Cancer Brother   . Colon cancer Neg Hx   . Inflammatory bowel disease Neg Hx   . Esophageal cancer Neg Hx   . Liver disease Neg Hx   .  Rectal cancer Neg Hx   . Stomach cancer Neg Hx      Social History   Occupational History    Comment: retired Therapist, sports  Tobacco Use  . Smoking status: Former Smoker    Packs/day: 0.50    Years: 46.00    Pack years: 23.00    Types: Cigarettes    Start date: 06/20/2000    Quit date: 09/29/2015    Years since quitting: 3.5  . Smokeless tobacco: Never Used  Substance and Sexual Activity  . 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 Drug use: No  . Sexual activity: Never    Allergies  Allergen Reactions  . Coconut Oil Hives     Outpatient Medications Prior to Visit  Medication Sig Dispense Refill  . albuterol (PROAIR HFA) 108 (90 BASE) MCG/ACT  inhaler Inhale 1 puff into the lungs every 6 (six) hours as needed for wheezing or shortness of breath. ProAir     . albuterol (PROVENTIL) (2.5 MG/3ML) 0.083% nebulizer solution Take 2.5 mg by nebulization every 6 (six) hours as needed for wheezing or shortness of breath.     Marland Kitchen aspirin EC 81 MG tablet Take 81 mg by mouth daily.     . benzonatate (TESSALON) 100 MG capsule Take 1 capsule (100 mg total) by mouth every 8 (eight) hours. 21 capsule 0  . budesonide-formoterol (SYMBICORT) 160-4.5 MCG/ACT inhaler Inhale 2 puffs into the lungs 2 (two) times daily.    . cetirizine-pseudoephedrine (ZYRTEC-D) 5-120 MG tablet Take 1 tablet by mouth daily. 15 tablet 0  . clopidogrel (PLAVIX) 75 MG tablet Take 75 mg by mouth daily.   1  . dicyclomine (BENTYL) 20 MG tablet Take 20 mg by mouth 4 (four) times daily as needed for spasms.     Marland Kitchen diltiazem (CARDIZEM CD) 180 MG 24 hr capsule Take 1 capsule (180 mg total) by mouth daily. 30 capsule 0  . doxycycline (VIBRAMYCIN) 100 MG capsule Take 1 capsule (100 mg total) by mouth 2 (two) times daily. 20 capsule 0  . ezetimibe (ZETIA) 10 MG tablet Take 10 mg by mouth daily with supper.    . famotidine (PEPCID) 40 MG tablet Take 40 mg by mouth 2 (two) times daily.    . ferrous sulfate 325 (65 FE) MG tablet Take 325 mg by mouth daily with breakfast.     . HUMALOG MIX 75/25 KWIKPEN (75-25) 100 UNIT/ML Kwikpen Inject 22 Units into the skin 2 (two) times daily with a meal.   2  . HYDROcodone-acetaminophen (NORCO) 10-325 MG tablet TK 1 T PO QID PRN P    . lisinopril-hydrochlorothiazide (PRINZIDE,ZESTORETIC) 20-12.5 MG per tablet Take 1 tablet by mouth daily.     Marland Kitchen lubiprostone (AMITIZA) 24 MCG capsule Take 24 mcg by mouth 2 (two) times daily with a meal.    . meloxicam (MOBIC) 15 MG tablet TAKE 1 TABLET BY MOUTH DAILY  0  . Na Sulfate-K Sulfate-Mg Sulf (SUPREP BOWEL PREP KIT) 17.5-3.13-1.6 GM/177ML SOLN Take 1 kit by mouth as directed. For colonoscopy prep 354 mL 0  .  Olopatadine HCl 0.2 % SOLN Apply 1 drop to eye daily. 2.5 mL 0  . omeprazole (PRILOSEC) 40 MG capsule Take 1 capsule (40 mg total) by mouth daily. 30 capsule 2  . polyethylene glycol (MIRALAX / GLYCOLAX) 17 g packet Take 17 g by mouth daily.    . pregabalin (LYRICA) 200 MG capsule Take 200 mg by mouth 3 (three)  times daily.    . promethazine (PHENERGAN) 25 MG tablet Take 25 mg by mouth every 6 (six) hours as needed for nausea or vomiting.     . rosuvastatin (CRESTOR) 10 MG tablet Take 10 mg by mouth at bedtime.     Marland Kitchen tiZANidine (ZANAFLEX) 4 MG tablet Take 4 mg by mouth every 6 (six) hours.    . traMADol (ULTRAM) 50 MG tablet Take by mouth.    . umeclidinium bromide (INCRUSE ELLIPTA) 62.5 MCG/INH AEPB Inhale into the lungs.    . Vitamin D, Ergocalciferol, (DRISDOL) 50000 UNITS CAPS capsule Take 50,000 Units by mouth every Tuesday.      No facility-administered medications prior to visit.     Review of Systems  Constitutional: Positive for weight loss. Negative for chills, diaphoresis, fever and malaise/fatigue.  HENT: Positive for congestion, ear pain and sinus pain. Negative for sore throat.   Eyes: Positive for redness.  Respiratory: Positive for cough and shortness of breath. Negative for hemoptysis, sputum production and wheezing.   Cardiovascular: Positive for palpitations. Negative for chest pain and leg swelling.  Gastrointestinal: Positive for abdominal pain, constipation, heartburn and nausea.  Genitourinary: Negative for frequency.  Musculoskeletal: Positive for back pain. Negative for joint pain and myalgias.  Skin: Negative for itching and rash.  Neurological: Positive for headaches. Negative for dizziness and weakness.  Endo/Heme/Allergies: Bruises/bleeds easily.  Psychiatric/Behavioral: Negative for depression. The patient is not nervous/anxious.      Objective:   Vitals:   04/17/19 1405  BP: 128/74  Pulse: 71  SpO2: 98%  Weight: 230 lb (104.3 kg)  Height: 5' 3"  (1.6 m)     Physical Exam: General: Well-appearing, no acute distress HENT: [place], AT Eyes: EOMI, no scleral icterus Respiratory: Clear to auscultation bilaterally.  No crackles, wheezing or rales Cardiovascular: RRR, -M/R/G, no JVD GI: BS+, soft, nontender Extremities:-Edema,-tenderness Neuro: AAO x4, CNII-XII grossly intact Skin: Intact, no rashes or bruising Psych: Normal mood, normal affect  Data Reviewed:  Imaging: CTA 02/01/16 - Peripheral patchy airspace disease, 5-22m RLL lung nodule  PFT: None on file  Labs: CBC    Component Value Date/Time   WBC 8.4 08/16/2016 0850   RBC 3.71 (L) 08/16/2016 0850   HGB 10.9 (L) 08/16/2016 0850   HCT 34.4 (L) 08/16/2016 0850   PLT 220 08/16/2016 0850   MCV 92.7 08/16/2016 0850   MCH 29.4 08/16/2016 0850   MCHC 31.7 08/16/2016 0850   RDW 15.1 08/16/2016 0850   LYMPHSABS 2.8 08/16/2016 0850   MONOABS 0.4 08/16/2016 0850   EOSABS 0.1 08/16/2016 0850   BASOSABS 0.0 08/16/2016 0850    Imaging, labs and tests noted above have been reviewed independently by me.    Assessment & Plan:   Discussion: 68year old female former smoker with significant smoking history >30pack years who presents with mild shortness of breath however would develop bronchitis symptoms when ill.   Mild COPD, GOLD Class B --START Spiriva 2.5 mcg TWO puffs ONCE a day --CONTINUE Albuterol as needed for shortness of breath or wheezing --Will arrange for PFTs when next available  Subcentimeter Lung Nodule --Obtain CT Lung follow-up  Health Maintenance Immunization History  Administered Date(s) Administered  . Hepatitis B, adult 06/18/2000  . Influenza Split 01/17/2011, 03/31/2012  . Influenza,inj,Quad PF,6+ Mos 02/20/2015, 02/20/2016  . Influenza,inj,quad, With Preservative 07/03/2013  . Influenza-Unspecified 02/05/2017, 03/20/2019  . Pneumococcal Conjugate-13 12/28/2017  . Pneumococcal Polysaccharide-23 01/16/2010  . Tdap 05/18/2000   CT Lung Screen  Due  now. Ordered  Orders Placed This Encounter  Procedures  . CT CHEST NODULE FOLLOW UP LOW DOSE W/O    Standing Status:   Future    Standing Expiration Date:   06/16/2020    Order Specific Question:   Preferred imaging location?    Answer:   [place] [facility]    Order Specific Question:   Radiology Contrast Protocol - do NOT remove file path    Answer:   _[email or web address]   Meds ordered this encounter  Medications  . Tiotropium Bromide Monohydrate (SPIRIVA RESPIMAT) 2.5 MCG/ACT AERS    Sig: Inhale 2 puffs into the lungs daily.    Dispense:  4 g    Refill:  5  . Tiotropium Bromide Monohydrate (SPIRIVA RESPIMAT) 2.5 MCG/ACT AERS    Sig: Inhale 2 puffs into the lungs daily.    Dispense:  1 g    Refill:  0    Order Specific Question:   Lot Number?    Answer:   [id number]    Order Specific Question:   Expiration Date?    Answer:   04/16/2021    Order Specific Question:   Quantity    Answer:   1   Return in about 3 months (around 07/16/2019).  [place], MD [facility] 04/17/2019 8:55 AM  Office Number [phone]

## 2019-04-17 NOTE — Addendum Note (Signed)
Addended by: Lorretta Harp on: 04/17/2019 03:04 PM   Modules accepted: Orders

## 2019-04-17 NOTE — Progress Notes (Signed)
Patient seen in the office today and instructed on use of Spiriva Respimat 2.5.  Patient expressed understanding and demonstrated technique. 

## 2019-05-02 ENCOUNTER — Encounter: Payer: Medicare Other | Admitting: Gastroenterology

## 2019-05-03 ENCOUNTER — Encounter: Payer: Self-pay | Admitting: Internal Medicine

## 2019-05-03 ENCOUNTER — Other Ambulatory Visit: Payer: Self-pay

## 2019-05-03 ENCOUNTER — Ambulatory Visit (INDEPENDENT_AMBULATORY_CARE_PROVIDER_SITE_OTHER): Payer: Medicare Other | Admitting: Internal Medicine

## 2019-05-03 VITALS — BP 128/72 | HR 77 | Temp 99.0°F | Ht 63.0 in | Wt 227.0 lb

## 2019-05-03 DIAGNOSIS — IMO0002 Reserved for concepts with insufficient information to code with codable children: Secondary | ICD-10-CM

## 2019-05-03 DIAGNOSIS — E118 Type 2 diabetes mellitus with unspecified complications: Secondary | ICD-10-CM

## 2019-05-03 DIAGNOSIS — E1142 Type 2 diabetes mellitus with diabetic polyneuropathy: Secondary | ICD-10-CM

## 2019-05-03 DIAGNOSIS — E1159 Type 2 diabetes mellitus with other circulatory complications: Secondary | ICD-10-CM | POA: Diagnosis not present

## 2019-05-03 DIAGNOSIS — Z794 Long term (current) use of insulin: Secondary | ICD-10-CM

## 2019-05-03 DIAGNOSIS — E119 Type 2 diabetes mellitus without complications: Secondary | ICD-10-CM | POA: Insufficient documentation

## 2019-05-03 DIAGNOSIS — E1165 Type 2 diabetes mellitus with hyperglycemia: Secondary | ICD-10-CM

## 2019-05-03 LAB — BASIC METABOLIC PANEL
BUN: 13 mg/dL (ref 6–23)
CO2: 30 mEq/L (ref 19–32)
Calcium: 9.6 mg/dL (ref 8.4–10.5)
Chloride: 105 mEq/L (ref 96–112)
Creatinine, Ser: 0.96 mg/dL (ref 0.40–1.20)
GFR: 70.15 mL/min (ref >60.00)
Glucose, Bld: 63 mg/dL — ABNORMAL LOW (ref 70–99)
Potassium: 4.2 mEq/L (ref 3.5–5.1)
Sodium: 141 mEq/L (ref 135–145)

## 2019-05-03 LAB — MICROALBUMIN / CREATININE URINE RATIO
Creatinine,U: 113.2 mg/dL
Microalb Creat Ratio: 0.6 mg/g (ref 0.0–30.0)
Microalb, Ur: 0.7 mg/dL (ref 0.0–1.9)

## 2019-05-03 LAB — POCT GLYCOSYLATED HEMOGLOBIN (HGB A1C): Hemoglobin A1C: 7.4 % — AB (ref 4.0–5.6)

## 2019-05-03 LAB — GLUCOSE, POCT (MANUAL RESULT ENTRY): POC Glucose: 98 mg/dl (ref 70–99)

## 2019-05-03 NOTE — Patient Instructions (Addendum)
-   Increase Humalog Mix to 24 units with Breakfast and 22 units with supper  - Check sugar before breakfast and supper    HOW TO TREAT LOW BLOOD SUGARS (Blood sugar LESS THAN 70 MG/DL)  Please follow the RULE OF 15 for the treatment of hypoglycemia treatment (when your (blood sugars are less than 70 mg/dL)    STEP 1: Take 15 grams of carbohydrates when your blood sugar is low, which includes:   3-4 GLUCOSE TABS  OR  3-4 OZ OF JUICE OR REGULAR SODA OR  ONE TUBE OF GLUCOSE GEL     STEP 2: RECHECK blood sugar in 15 MINUTES STEP 3: If your blood sugar is still low at the 15 minute recheck --> then, go back to STEP 1 and treat AGAIN with another 15 grams of carbohydrates.

## 2019-05-03 NOTE — Progress Notes (Signed)
Name: Christina Solis  MRN/ DOB: [id number], 1951/03/10   Age/ Sex: 68 y.o., female    PCP: [name], NP   Reason for Endocrinology Evaluation: Type 2 Diabetes Mellitus     Date of Initial Endocrinology Visit: 05/03/2019     PATIENT IDENTIFIER: Christina Solis is a 68 y.o. female with a past medical history of HTN,COPD and T2DM  . The patient presented for initial endocrinology clinic visit on 05/03/2019 for consultative assistance with her diabetes management.    HPI: Christina Solis was    Diagnosed with DM 2008 Prior Medications tried/Intolerance: Metformin - stopped due to CKD. Has been on insulin since 2009 Currently checking blood sugars 3 x / day,  before meals .  Hypoglycemia episodes : Yes              Symptoms: yes                Frequency: 1-2/ month  Hemoglobin A1c has ranged from 6.9 % in 2015, peaking at 7.4% in 2020. Patient required assistance for hypoglycemia: no  Patient has required hospitalization within the last 1 year from hyper or hypoglycemia: no   In terms of diet, the patient eats 3 meals a day, avoids sugar- sweetened beverages   She is retired Marine scientist  Lives with daughter   [place]: Humalog Mix 22 units BID    Statin:Yes ACE-I/ARB: Yes Prior Diabetic Education: No    DIABETIC COMPLICATIONS: Microvascular complications:   Neuropathy  Denies: retinopathy  Last eye exam: Completed 01/2019  Macrovascular complications:   CHF, PVD  Denies: CAD, CVA   PAST HISTORY: Past Medical History:  Past Medical History:  Diagnosis Date  . Anemia    sickle cell trait  . Anxiety   . Arthritis    "knees" (10/12/2013)  . CHF (congestive heart failure) ([place])   . Chronic back pain   . Chronic bronchitis ([place])    "got it q yr when I lived in [place]"  . Complication of anesthesia    hard to put under  . COPD (chronic obstructive pulmonary disease) ([place])   . Dysrhythmia   . Family history of anesthesia complication    sister hard  to wake up  . Family history of anesthesia complication    niece have itching  . Fibromyalgia   . GERD (gastroesophageal reflux disease)   . Hyperlipidemia   . Hypertension   . Irregular heart beat   . Migraine    "used to get them alot; not regular anymore" (10/12/2013)  . PAD (peripheral artery disease) ([place])   . Pituitary tumor    followed at [facility]; on cabergoline 09/2013  . Pneumonia    "twice"  . Type II diabetes mellitus ([place])    Past Surgical History:  Past Surgical History:  Procedure Laterality Date  . ABDOMINAL AORTAGRAM N/A 06/28/2012   Procedure: ABDOMINAL Maxcine Ham;  Surgeon: Laverda Page, MD;  Location: [facility] CATH LAB;  Service: Cardiovascular;  Laterality: N/A;  . APPENDECTOMY    . BACK SURGERY     x 2, lower back  . BILATERAL CARPAL TUNNEL RELEASE    . CARDIAC CATHETERIZATION    . CESAREAN SECTION  1974; 1976; 1978  . CHOLECYSTECTOMY N/A 10/12/2013   Procedure: LAPAROSCOPIC CHOLECYSTECTOMY WITH INTRAOPERATIVE CHOLANGIOGRAM;  Surgeon: [name]. Georgette Dover, MD;  Location: [facility];  Service: General;  Laterality: N/A;  . COLONOSCOPY    . FEMORAL ARTERY STENT Left 06/28/2012  SFA  . LAPAROSCOPIC CHOLECYSTECTOMY  10/12/2013  . LEFT HEART CATHETERIZATION WITH CORONARY ANGIOGRAM N/A 06/28/2012   Procedure: LEFT HEART CATHETERIZATION WITH CORONARY ANGIOGRAM;  Surgeon: Laverda Page, MD;  Location: [facility] CATH LAB;  Service: Cardiovascular;  Laterality: N/A;  . LOWER EXTREMITY ANGIOGRAM N/A 06/28/2012   Procedure: LOWER EXTREMITY ANGIOGRAM;  Surgeon: Laverda Page, MD;  Location: [facility] CATH LAB;  Service: Cardiovascular;  Laterality: N/A;  . PERIPHERAL VASCULAR CATHETERIZATION N/A 09/10/2015   Procedure: Abdominal Aortogram w/Lower Extremity;  Surgeon: Adrian Prows, MD;  Location: [facility] CV LAB;  Service: Cardiovascular;  Laterality: N/A;  . POSTERIOR LUMBAR FUSION    . REPLACEMENT TOTAL KNEE Left   . TONSILLECTOMY    . TUBAL LIGATION  1978      Social  History:  reports that she quit smoking about 3 years ago. Her smoking use included cigarettes. She started smoking about 18 years ago. She has a 23.00 pack-year smoking history. She has never used smokeless tobacco. She reports that she does not drink alcohol or use drugs. Family History:  Family History  Problem Relation Age of Onset  . Diabetes Mother   . Kidney disease Mother   . Cancer Father   . Pancreatic cancer Father   . Kidney disease Brother   . Cancer Brother   . Colon cancer Neg Hx   . Inflammatory bowel disease Neg Hx   . Esophageal cancer Neg Hx   . Liver disease Neg Hx   . Rectal cancer Neg Hx   . Stomach cancer Neg Hx      HOME MEDICATIONS: Allergies as of 05/03/2019      Reactions   Coconut Oil Hives      Medication List       Accurate as of May 03, 2019 11:17 AM. If you have any questions, ask your nurse or doctor.        aspirin EC 81 MG tablet Take 81 mg by mouth daily.   benzonatate 100 MG capsule Commonly known as: TESSALON Take 1 capsule (100 mg total) by mouth every 8 (eight) hours.   budesonide-formoterol 160-4.5 MCG/ACT inhaler Commonly known as: SYMBICORT Inhale 2 puffs into the lungs 2 (two) times daily.   cetirizine-pseudoephedrine 5-120 MG tablet Commonly known as: ZYRTEC-D Take 1 tablet by mouth daily.   clopidogrel 75 MG tablet Commonly known as: PLAVIX Take 75 mg by mouth daily.   diclofenac Sodium 1 % Gel Commonly known as: VOLTAREN Apply topically.   dicyclomine 20 MG tablet Commonly known as: BENTYL Take 20 mg by mouth 4 (four) times daily as needed for spasms.   diltiazem 180 MG 24 hr capsule Commonly known as: CARDIZEM CD Take 1 capsule (180 mg total) by mouth daily.   famotidine 40 MG tablet Commonly known as: PEPCID Take 40 mg by mouth 2 (two) times daily.   ferrous sulfate 325 (65 FE) MG tablet Take 325 mg by mouth daily with breakfast.   HumaLOG Mix 75/25 KwikPen (75-25) 100 UNIT/ML Kwikpen Generic  drug: Insulin Lispro Prot & Lispro Inject 22 Units into the skin 2 (two) times daily with a meal.   lisinopril-hydrochlorothiazide 20-12.5 MG tablet Commonly known as: ZESTORETIC Take 1 tablet by mouth daily.   lubiprostone 24 MCG capsule Commonly known as: AMITIZA Take 24 mcg by mouth 2 (two) times daily with a meal.   meloxicam 15 MG tablet Commonly known as: MOBIC TAKE 1 TABLET BY MOUTH DAILY   Olopatadine HCl 0.2 % Soln  Apply 1 drop to eye daily.   omeprazole 40 MG capsule Commonly known as: PRILOSEC Take 1 capsule (40 mg total) by mouth daily.   oxyCODONE-acetaminophen 10-325 MG tablet Commonly known as: PERCOCET Take 1 tablet by mouth every 4 (four) hours as needed.   pregabalin 200 MG capsule Commonly known as: LYRICA Take 200 mg by mouth 3 (three) times daily.   ProAir HFA 108 (90 Base) MCG/ACT inhaler Generic drug: albuterol Inhale 1 puff into the lungs every 6 (six) hours as needed for wheezing or shortness of breath. ProAir   albuterol (2.5 MG/3ML) 0.083% nebulizer solution Commonly known as: PROVENTIL Take 2.5 mg by nebulization every 6 (six) hours as needed for wheezing or shortness of breath.   promethazine 25 MG tablet Commonly known as: PHENERGAN Take 25 mg by mouth every 6 (six) hours as needed for nausea or vomiting.   rosuvastatin 10 MG tablet Commonly known as: CRESTOR Take 10 mg by mouth at bedtime.   Spiriva Respimat 2.5 MCG/ACT Aers Generic drug: Tiotropium Bromide Monohydrate Inhale 2 puffs into the lungs daily.   Spiriva Respimat 2.5 MCG/ACT Aers Generic drug: Tiotropium Bromide Monohydrate Inhale 2 puffs into the lungs daily.   Suprep Bowel Prep Kit 17.5-3.13-1.6 GM/177ML Soln Generic drug: Na Sulfate-K Sulfate-Mg Sulf Take 1 kit by mouth as directed. For colonoscopy prep   tiZANidine 4 MG tablet Commonly known as: ZANAFLEX Take 4 mg by mouth every 6 (six) hours.   Vitamin D (Ergocalciferol) 1.25 MG (50000 UT) Caps capsule  Commonly known as: DRISDOL Take 50,000 Units by mouth every Tuesday.        ALLERGIES: Allergies  Allergen Reactions  . Coconut Oil Hives     REVIEW OF SYSTEMS: A comprehensive ROS was conducted with the patient and is negative except as per HPI and below:  Review of Systems  Constitutional: Negative for chills and fever.  HENT: Negative for congestion and sore throat.   Respiratory: Negative for cough and shortness of breath.   Cardiovascular: Negative for chest pain and claudication.  Gastrointestinal: Positive for constipation. Negative for nausea.  Genitourinary: Negative for frequency.  Neurological: Positive for tingling. Negative for tremors.  Endo/Heme/Allergies: Negative for polydipsia.  Psychiatric/Behavioral: Negative for depression. The patient is not nervous/anxious.       OBJECTIVE:   VITAL SIGNS: BP 128/72 (BP Location: Right Arm, Patient Position: Sitting, Cuff Size: Large)   Pulse 77   Temp 99 F (37.2 C)   Ht _0  (1.6 m)   Wt 227 lb (103 kg)   SpO2 97%   BMI 40.21 kg/m    PHYSICAL EXAM:  General: Pt appears well and is in NAD  Hydration: Well-hydrated with moist mucous membranes and good skin turgor  HEENT: Eyes: External eye exam normal without stare, lid lag or exophthalmos.  EOM intact.   Neck: General: Supple without adenopathy or carotid bruits. Thyroid: Thyroid size normal.  No goiter or nodules appreciated. No thyroid bruit.  Lungs: Clear with good BS bilat with no rales, rhonchi, or wheezes  Heart: RRR with normal S1 and S2 and no gallops; no murmurs; no rub  Abdomen: Normoactive bowel sounds, soft, nontender, without masses or organomegaly palpable  Extremities:  Lower extremities - No pretibial edema. No lesions.  Skin: Normal texture and temperature to palpation.   Neuro: MS is good with appropriate affect, pt is alert and Ox3    DM foot exam: 05/03/2019  The skin of the feet is intact without sores or ulcerations.  The pedal  pulses are 2+ on right and 2+ on left. The sensation is intact to a screening 5.07, 10 gram monofilament bilaterally   DATA REVIEWED:  Lab Results  Component Value Date   HGBA1C 7.4 (A) 05/03/2019   HGBA1C 7.0 (H) 02/07/2016   HGBA1C 6.9 (H) 10/12/2013    12/15/2018  T.Chol 146 TG 235 HDL 26 LDL 73   Results for KYRAN, WHITTIER (MRN [id number]) as of 05/04/2019 10:34  Ref. Range 05/03/2019 11:36  Sodium Latest Ref Range: 135 - 145 mEq/L 141  Potassium Latest Ref Range: 3.5 - 5.1 mEq/L 4.2  Chloride Latest Ref Range: 96 - 112 mEq/L 105  CO2 Latest Ref Range: 19 - 32 mEq/L 30  Glucose Latest Ref Range: 70 - 99 mg/dL 63 (L)  BUN Latest Ref Range: 6 - 23 mg/dL 13  Creatinine Latest Ref Range: 0.40 - 1.20 mg/dL 0.96  Calcium Latest Ref Range: 8.4 - 10.5 mg/dL 9.6  GFR Latest Ref Range: >60.00 mL/min 70.15  MICROALB/CREAT RATIO Latest Ref Range: 0.0 - 30.0 mg/g 0.6   ASSESSMENT / PLAN / RECOMMENDATIONS:   1) Type 2 Diabetes Mellitus, Sub-Optimally controlled, With Neuropathic and macrovascular  complications - Most recent A1c of 7.4 %. Goal A1c < 7.5 %.    Plan: GENERAL: I have discussed with the patient the pathophysiology of diabetes. We went over the natural progression of the disease. We talked about both insulin resistance and insulin deficiency. We stressed the importance of lifestyle changes including diet and exercise. I explained the complications associated with diabetes including retinopathy, nephropathy, neuropathy as well as increased risk of cardiovascular disease. We went over the benefit seen with glycemic control.    I explained to the patient that diabetic patients are at higher than normal risk for amputations.   Pt did not being a meter today, we discussed the importance of having this data available to me during these visit, but based on memory recall her fasting BG's are in low 100's but her supper BG's are ~ 160 mg/dL will adjust insulin as below    MEDICATIONS:  Increase Humalog Mix to 24 units with Breakfast and continue 22 units with supper    EDUCATION / INSTRUCTIONS:  BG monitoring instructions: Patient is instructed to check her blood sugars 2 times a day, fasting and supper .  Call [facility] if: BG persistently < 70 or > 300. . I reviewed the Rule of 15 for the treatment of hypoglycemia in detail with the patient. Literature supplied.   2) Diabetic complications:   Eye: Does not have known diabetic retinopathy.   Neuro/ Feet: Does have known diabetic peripheral neuropathy.  Renal: Patient does  have known baseline CKD. She is  on an ACEI/ARB at present.Check urine albumin/creatinine ratio yearly starting at time of diagnosis. If albuminuria is positive, treatment is geared toward better glucose, blood pressure control and use of ACE inhibitors or ARBs. Monitor electrolytes and creatinine once to twice yearly.   3) Lipids: Patient is on Rosuvastatin 10 mg, LDL at goal    4) Hypertension: She is  at goal of < 140/90 mmHg.    F/U in 3 months    Signed electronically by: Mack Guise, MD  [facility]  [facility] [address] [place], [place] [address] Phone: [phone] FAX: [phone]   CC: [name], NP [place] [address] Phone: [phone]  Fax: [phone]  Return to Endocrinology clinic as below: Future Appointments  Date Time Provider [place]  06/07/2019  9:00 AM [facility] None

## 2019-05-04 ENCOUNTER — Encounter: Payer: Self-pay | Admitting: Internal Medicine

## 2019-06-02 ENCOUNTER — Inpatient Hospital Stay (HOSPITAL_COMMUNITY): Admission: RE | Admit: 2019-06-02 | Payer: Medicare Other | Source: Ambulatory Visit

## 2019-06-24 ENCOUNTER — Other Ambulatory Visit: Payer: Self-pay | Admitting: Gastroenterology

## 2019-06-26 ENCOUNTER — Other Ambulatory Visit: Payer: Self-pay | Admitting: Gastroenterology

## 2019-06-30 ENCOUNTER — Other Ambulatory Visit: Payer: Self-pay | Admitting: Gastroenterology

## 2019-07-03 ENCOUNTER — Ambulatory Visit
Admission: RE | Admit: 2019-07-03 | Discharge: 2019-07-03 | Disposition: A | Discharge status: Home / Self Care | Payer: Medicare Other | Source: Ambulatory Visit | Attending: Pulmonary Disease | Admitting: Pulmonary Disease

## 2019-07-03 DIAGNOSIS — R918 Other nonspecific abnormal finding of lung field: Secondary | ICD-10-CM

## 2019-07-05 ENCOUNTER — Ambulatory Visit (INDEPENDENT_AMBULATORY_CARE_PROVIDER_SITE_OTHER): Payer: Medicare Other | Admitting: Pulmonary Disease

## 2019-07-05 ENCOUNTER — Other Ambulatory Visit: Payer: Self-pay

## 2019-07-05 DIAGNOSIS — R918 Other nonspecific abnormal finding of lung field: Secondary | ICD-10-CM | POA: Diagnosis not present

## 2019-07-05 DIAGNOSIS — J449 Chronic obstructive pulmonary disease, unspecified: Secondary | ICD-10-CM

## 2019-07-05 NOTE — Patient Instructions (Addendum)
Mild COPD, GOLD Class B --CONTINUE Spiriva 2.5 mcg TWO puffs ONCE a day --CONTINUE Albuterol as needed for shortness of breath or wheezing --Will arrange for PFTs when available  Multiple benign subcentimeter lung nodules, unchanged >2 years --No further imaging indicated --Would continue routine image surveillance for lung cancer screening  Follow-up with me in 6 months

## 2019-07-05 NOTE — Progress Notes (Signed)
Virtual Visit via Telephone Note  I connected with Christina Solis on 07/06/19 at  9:45 AM EST by telephone and verified that I am speaking with the correct person using two identifiers.  Location: Patient: Home Provider: [facility]   I discussed the limitations, risks, security and privacy concerns of performing an evaluation and management service by telephone and the availability of in person appointments. I also discussed with the patient that there may be a patient responsible charge related to this service. The patient expressed understanding and agreed to proceed.   I discussed the assessment and treatment plan with the patient. The patient was provided an opportunity to ask questions and all were answered. The patient agreed with the plan and demonstrated an understanding of the instructions.   The patient was advised to call back or seek an in-person evaluation if the symptoms worsen or if the condition fails to improve as anticipated.  I provided 22 minutes of non-face-to-face time during this encounter.   Subjective:   PATIENT ID: Christina Solis: Jun 24, 1950, MRN: [id number]   HPI  Chief Complaint  Patient presents with  . Follow-up    COPD follow-up   Reason for Visit: Follow-up  [name] is a 69 year old female with COPD with chronic bronchitis, DM2 with peripheral neuropathy, stage IIB CKD and morbid obesity who presents for follow-up for COPD.  She was diagnosed with COPD 9 years ago. She previously smoked 2ppd x 20 years and started cutting back in 2007 and finally quit 2017. She previously was seen by a Pulmonologist when she lived in [place]. She has been hospitalized three times for her COPD/pneumonia with the last hospitalization in 2017, but has never been intubated.   She has been compliant with Spiriva and feels inhaler is doing overall well. Denies shortness of breath, wheezing or cough. Today, she cannot present to the  office due to viral illness that she caught from her granddaughter and daughter. Before this, she was doing good. She has not needed to use albuterol even while she is sick. Last time she needed steroids was in 2017.  She previously used her Albuterol once a day but whenever she is sick, the severity of sickness is bad and she requires albuterol every 4-6 hours. Last year was her most recent exacerbation requiring steroids. She does not like using prednisone as it causes her blood sugar increases and causes her to feel giddy. She has been on Incruse in the past and only felt like it helped her symptoms somewhat.  Social History: Quit smoking 2017.  Retired Marine scientist in hospital, hospice, nursing homes  I have personally reviewed patient's past medical/family/social history/allergies/current medications. Past Medical History:  Diagnosis Date  . Anemia    sickle cell trait  . Anxiety   . Arthritis    "knees" (10/12/2013)  . CHF (congestive heart failure) ([place])   . Chronic back pain   . Chronic bronchitis ([place])    "got it q yr when I lived in [place]"  . Complication of anesthesia    hard to put under  . COPD (chronic obstructive pulmonary disease) ([place])   . Dysrhythmia   . Family history of anesthesia complication    sister hard to wake up  . Family history of anesthesia complication    niece have itching  . Fibromyalgia   . GERD (gastroesophageal reflux disease)   . Hyperlipidemia   . Hypertension   . Irregular heart beat   .  Migraine    "used to get them alot; not regular anymore" (10/12/2013)  . PAD (peripheral artery disease) ([place])   . Pituitary tumor    followed at [facility]; on cabergoline 09/2013  . Pneumonia    "twice"  . Type II diabetes mellitus ([place])      Outpatient Medications Prior to Visit  Medication Sig Dispense Refill  . albuterol (PROAIR HFA) 108 (90 BASE) MCG/ACT inhaler Inhale 1 puff into the lungs every 6 (six) hours as needed for wheezing or  shortness of breath. ProAir     . albuterol (PROVENTIL) (2.5 MG/3ML) 0.083% nebulizer solution Take 2.5 mg by nebulization every 6 (six) hours as needed for wheezing or shortness of breath.     Marland Kitchen aspirin EC 81 MG tablet Take 81 mg by mouth daily.     . benzonatate (TESSALON) 100 MG capsule Take 1 capsule (100 mg total) by mouth every 8 (eight) hours. 21 capsule 0  . budesonide-formoterol (SYMBICORT) 160-4.5 MCG/ACT inhaler Inhale 2 puffs into the lungs 2 (two) times daily.    . cetirizine-pseudoephedrine (ZYRTEC-D) 5-120 MG tablet Take 1 tablet by mouth daily. 15 tablet 0  . clopidogrel (PLAVIX) 75 MG tablet Take 75 mg by mouth daily.   1  . diclofenac Sodium (VOLTAREN) 1 % GEL Apply topically.    . dicyclomine (BENTYL) 20 MG tablet Take 20 mg by mouth 4 (four) times daily as needed for spasms.     Marland Kitchen diltiazem (CARDIZEM CD) 180 MG 24 hr capsule Take 1 capsule (180 mg total) by mouth daily. 30 capsule 0  . famotidine (PEPCID) 40 MG tablet Take 40 mg by mouth 2 (two) times daily.    . ferrous sulfate 325 (65 FE) MG tablet Take 325 mg by mouth daily with breakfast.     . HUMALOG MIX 75/25 KWIKPEN (75-25) 100 UNIT/ML Kwikpen Inject 22 Units into the skin 2 (two) times daily with a meal.   2  . lisinopril-hydrochlorothiazide (PRINZIDE,ZESTORETIC) 20-12.5 MG per tablet Take 1 tablet by mouth daily.     Marland Kitchen lubiprostone (AMITIZA) 24 MCG capsule Take 24 mcg by mouth 2 (two) times daily with a meal.    . meloxicam (MOBIC) 15 MG tablet TAKE 1 TABLET BY MOUTH DAILY  0  . Na Sulfate-K Sulfate-Mg Sulf (SUPREP BOWEL PREP KIT) 17.5-3.13-1.6 GM/177ML SOLN Take 1 kit by mouth as directed. For colonoscopy prep (Patient not taking: Reported on 04/17/2019) 354 mL 0  . Olopatadine HCl 0.2 % SOLN Apply 1 drop to eye daily. 2.5 mL 0  . omeprazole (PRILOSEC) 40 MG capsule TAKE 1 CAPSULE(40 MG) BY MOUTH DAILY 30 capsule 1  . oxyCODONE-acetaminophen (PERCOCET) 10-325 MG tablet Take 1 tablet by mouth every 4 (four) hours as  needed.    . pregabalin (LYRICA) 200 MG capsule Take 200 mg by mouth 3 (three) times daily.    . promethazine (PHENERGAN) 25 MG tablet Take 25 mg by mouth every 6 (six) hours as needed for nausea or vomiting.     . rosuvastatin (CRESTOR) 10 MG tablet Take 10 mg by mouth at bedtime.     . Tiotropium Bromide Monohydrate (SPIRIVA RESPIMAT) 2.5 MCG/ACT AERS Inhale 2 puffs into the lungs daily. 4 g 5  . Tiotropium Bromide Monohydrate (SPIRIVA RESPIMAT) 2.5 MCG/ACT AERS Inhale 2 puffs into the lungs daily. 1 g 0  . tiZANidine (ZANAFLEX) 4 MG tablet Take 4 mg by mouth every 6 (six) hours.    . Vitamin D, Ergocalciferol, (DRISDOL) 50000  UNITS CAPS capsule Take 50,000 Units by mouth every Tuesday.      No facility-administered medications prior to visit.    Review of Systems  Constitutional: Negative for chills, diaphoresis, fever, malaise/fatigue and weight loss.  HENT: Negative for congestion.   Respiratory: Negative for cough, hemoptysis, sputum production, shortness of breath and wheezing.   Cardiovascular: Negative for chest pain, palpitations and leg swelling.     Objective:   There were no vitals filed for this visit.   Physical Exam: No apparent distress on the phone  Data Reviewed:  Imaging: CTA 02/01/16 - Peripheral patchy airspace disease, 5-19m RLL lung nodule CT Chest 07/03/19 - Multiple subcentimeter pulmonary nodules including 529mand 2m69module in RLL and 3mm82mdule in RUL and 5 mm nodule LLL, unchanged compared to 2017  PFT: None on file  Imaging, labs and test noted above have been reviewed independently by me.   Assessment & Plan:   Discussion: 68 y43r old female former smoker with COPD with chronic bronchitis who presents for follow-up.  Mild COPD, GOLD Class B --CONTINUE Spiriva 2.5 mcg TWO puffs ONCE a day --CONTINUE Albuterol as needed for shortness of breath or wheezing --Will arrange for PFTs in the future  Multiple benign subcentimeter lung nodules,  unchanged >2 years --No further imaging indicated --Would continue routine image surveillance for lung cancer screening  Health Maintenance Immunization History  Administered Date(s) Administered  . Hepatitis B, adult 06/18/2000  . Influenza Split 01/17/2011, 03/31/2012  . Influenza,inj,Quad PF,6+ Mos 02/20/2015, 02/20/2016, 02/05/2017  . Influenza,inj,quad, With Preservative 07/03/2013  . Influenza-Unspecified 02/05/2017, 03/20/2019  . Pneumococcal Conjugate-13 12/28/2017  . Pneumococcal Polysaccharide-23 01/16/2010  . Tdap 05/18/2000   CT Lung Screen - Due 06/2020  No orders of the defined types were placed in this encounter.  No orders of the defined types were placed in this encounter.  Return in about 6 months (around 01/02/2020).  Mckenzie Toruno [facility] 07/05/2019 8:01 AM  Office Number [phone]

## 2019-07-06 DIAGNOSIS — J449 Chronic obstructive pulmonary disease, unspecified: Secondary | ICD-10-CM | POA: Insufficient documentation

## 2019-07-06 DIAGNOSIS — R918 Other nonspecific abnormal finding of lung field: Secondary | ICD-10-CM | POA: Insufficient documentation

## 2019-07-24 ENCOUNTER — Other Ambulatory Visit (HOSPITAL_COMMUNITY): Payer: Medicare Other

## 2019-07-26 ENCOUNTER — Other Ambulatory Visit: Payer: Self-pay | Admitting: Gastroenterology

## 2019-08-03 ENCOUNTER — Other Ambulatory Visit: Payer: Self-pay

## 2019-08-03 ENCOUNTER — Ambulatory Visit (INDEPENDENT_AMBULATORY_CARE_PROVIDER_SITE_OTHER): Payer: Medicare Other | Admitting: Internal Medicine

## 2019-08-03 VITALS — BP 116/68 | HR 78 | Temp 98.5°F | Ht 63.0 in | Wt 231.0 lb

## 2019-08-03 DIAGNOSIS — E1142 Type 2 diabetes mellitus with diabetic polyneuropathy: Secondary | ICD-10-CM

## 2019-08-03 DIAGNOSIS — E1159 Type 2 diabetes mellitus with other circulatory complications: Secondary | ICD-10-CM

## 2019-08-03 DIAGNOSIS — E1165 Type 2 diabetes mellitus with hyperglycemia: Secondary | ICD-10-CM

## 2019-08-03 DIAGNOSIS — IMO0002 Reserved for concepts with insufficient information to code with codable children: Secondary | ICD-10-CM

## 2019-08-03 DIAGNOSIS — Z794 Long term (current) use of insulin: Secondary | ICD-10-CM

## 2019-08-03 DIAGNOSIS — E118 Type 2 diabetes mellitus with unspecified complications: Secondary | ICD-10-CM | POA: Diagnosis not present

## 2019-08-03 LAB — POCT GLYCOSYLATED HEMOGLOBIN (HGB A1C): Hemoglobin A1C: 6.9 % — AB (ref 4.0–5.6)

## 2019-08-03 MED ORDER — HUMALOG MIX 75/25 KWIKPEN (75-25) 100 UNIT/ML ~~LOC~~ SUPN: PEN_INJECTOR | SUBCUTANEOUS | 11 refills | Status: DC

## 2019-08-03 NOTE — Progress Notes (Signed)
Name: Christina Solis  Age/ Sex: 69 y.o., female   MRN/ DOB: [id number], [birth year]     PCP: [name], NP   Reason for Endocrinology Evaluation: Type 2 Diabetes Mellitus  Initial Endocrine Consultative Visit:  05/03/2019    PATIENT IDENTIFIER: Christina Solis is a 69 y.o. female with a past medical history of HTN, COPD, and T2DM. The patient has followed with Endocrinology clinic since 05/03/2019 for consultative assistance with management of her diabetes.  DIABETIC HISTORY:  Christina Solis was diagnosed with T2 DM in 2008 .she was on metformin that was stopped due to CKD, has been on insulin since 2009. Her hemoglobin A1c has ranged from 6.9% in 2015, peaking at 7.4% in 2020.  On her initial visit to our clinic she had an A1c of 7.4%, she was on Humalog mix, which was continued and adjusted.    She is a retired [employer] scientist Lives with daughter SUBJECTIVE:   During the last visit (05/03/2019): A1c 7.4% we adjusted insulin mix  Today (08/03/2019): Christina Solis is here for follow-up on diabetes management. She checks her blood sugars 1 times daily.. The patient has not had hypoglycemic episodes since the last clinic visit. Otherwise, the patient has not required any recent emergency interventions for hypoglycemia and has not had recent hospitalizations secondary to hyper or hypoglycemic episodes.      HOME DIABETES REGIMEN:  Humalog mix 24 units with breakfast and 22 units with supper  Statin: Yes ACE-I/ARB: Yes    METER DOWNLOAD SUMMARY: Date range evaluated: 3/5-3/18/2021 Fingerstick Blood Glucose Tests = 11 Average Number Tests/Day = 0.8 Overall Mean FS Glucose = 140   BG Ranges: Low = 80 High = 275   Hypoglycemic Events/30 Days: BG < 50 = 0 Episodes of symptomatic severe hypoglycemia = 0    DIABETIC COMPLICATIONS: Microvascular complications:   Neuropathy  Denies: retinopathy  Last eye exam: Completed 01/2019  Macrovascular complications:   CHF, PVD   Denies: CAD, CVA   HISTORY:  Past Medical History:  Past Medical History:  Diagnosis Date  . Anemia    sickle cell trait  . Anxiety   . Arthritis    "knees" (10/12/2013)  . CHF (congestive heart failure) ([place])   . Chronic back pain   . Chronic bronchitis ([place])    "got it q yr when I lived in [place]"  . Complication of anesthesia    hard to put under  . COPD (chronic obstructive pulmonary disease) ([place])   . Dysrhythmia   . Family history of anesthesia complication    sister hard to wake up  . Family history of anesthesia complication    niece have itching  . Fibromyalgia   . GERD (gastroesophageal reflux disease)   . Hyperlipidemia   . Hypertension   . Irregular heart beat   . Migraine    "used to get them alot; not regular anymore" (10/12/2013)  . PAD (peripheral artery disease) ([place])   . 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 Pneumonia    "twice"  . Type II diabetes mellitus ([place])    Past Surgical History:  Past Surgical History:  Procedure Laterality Date  . ABDOMINAL AORTAGRAM N/A 06/28/2012   Procedure: ABDOMINAL Maxcine Ham;  Surgeon: Laverda Page, MD;  Location: [facility] CATH LAB;  Service: Cardiovascular;  Laterality: N/A;  . APPENDECTOMY    . BACK SURGERY     x 2, lower back  . BILATERAL CARPAL  TUNNEL RELEASE    . CARDIAC CATHETERIZATION    . CESAREAN SECTION  1974; 1976; 1978  . CHOLECYSTECTOMY N/A 10/12/2013   Procedure: LAPAROSCOPIC CHOLECYSTECTOMY WITH INTRAOPERATIVE CHOLANGIOGRAM;  Surgeon: [name]. Georgette Dover, MD;  Location: [facility];  Service: General;  Laterality: N/A;  . COLONOSCOPY    . FEMORAL ARTERY STENT Left 06/28/2012   SFA  . LAPAROSCOPIC CHOLECYSTECTOMY  10/12/2013  . LEFT HEART CATHETERIZATION WITH CORONARY ANGIOGRAM N/A 06/28/2012   Procedure: LEFT HEART CATHETERIZATION WITH CORONARY ANGIOGRAM;  Surgeon: Laverda Page, MD;  Location[facility] CATH LAB;  Service: Cardiovascular;  Laterality: N/A;  . LOWER  EXTREMITY ANGIOGRAM N/A 06/28/2012   Procedure: LOWER EXTREMITY ANGIOGRAM;  Surgeon: Laverda Page, MD;  Location: [facility] CATH LAB;  Service: Cardiovascular;  Laterality: N/A;  . PERIPHERAL VASCULAR CATHETERIZATION N/A 09/10/2015   Procedure: Abdominal Aortogram w/Lower Extremity;  Surgeon: Adrian Prows, MD;  Location: [place] CV LAB;  Service: Cardiovascular;  Laterality: N/A;  . POSTERIOR LUMBAR FUSION    . REPLACEMENT TOTAL KNEE Left   . TONSILLECTOMY    . TUBAL LIGATION  1978    Social History:  reports that she quit smoking about 3 years ago. Her smoking use included cigarettes. She started smoking about 19 years ago. She has a 23.00 pack-year smoking history. She has never used smokeless tobacco. She reports that she does not drink alcohol or use drugs. Family History:  Family History  Problem Relation Age of Onset  . Diabetes Mother   . Kidney disease Mother   . Cancer Father   . Pancreatic cancer Father   . Kidney disease Brother   . Cancer Brother   . Colon cancer Neg Hx   . Inflammatory bowel disease Neg Hx   . Esophageal cancer Neg Hx   . Liver disease Neg Hx   . Rectal cancer Neg Hx   . Stomach cancer Neg Hx      HOME MEDICATIONS: Allergies as of 08/03/2019      Reactions   Coconut Oil Hives      Medication List       Accurate as of August 03, 2019 11:01 AM. If you have any questions, ask your nurse or doctor.        aspirin EC 81 MG tablet Take 81 mg by mouth daily.   benzonatate 100 MG capsule Commonly known as: TESSALON Take 1 capsule (100 mg total) by mouth every 8 (eight) hours.   budesonide-formoterol 160-4.5 MCG/ACT inhaler Commonly known as: SYMBICORT Inhale 2 puffs into the lungs 2 (two) times daily.   cetirizine-pseudoephedrine 5-120 MG tablet Commonly known as: ZYRTEC-D Take 1 tablet by mouth daily.   clopidogrel 75 MG tablet Commonly known as: PLAVIX Take 75 mg by mouth daily.   diclofenac Sodium 1 % Gel Commonly known as: VOLTAREN  Apply topically.   dicyclomine 20 MG tablet Commonly known as: BENTYL Take 20 mg by mouth 4 (four) times daily as needed for spasms.   diltiazem 180 MG 24 hr capsule Commonly known as: CARDIZEM CD Take 1 capsule (180 mg total) by mouth daily.   famotidine 40 MG tablet Commonly known as: PEPCID Take 40 mg by mouth 2 (two) times daily.   ferrous sulfate 325 (65 FE) MG tablet Take 325 mg by mouth daily with breakfast.   HumaLOG Mix 75/25 KwikPen (75-25) 100 UNIT/ML Kwikpen Generic drug: Insulin Lispro Prot & Lispro Inject 22 Units into the skin 2 (two) times daily with a meal.  lisinopril-hydrochlorothiazide 20-12.5 MG tablet Commonly known as: ZESTORETIC Take 1 tablet by mouth daily.   lubiprostone 24 MCG capsule Commonly known as: AMITIZA Take 24 mcg by mouth 2 (two) times daily with a meal.   meloxicam 15 MG tablet Commonly known as: MOBIC TAKE 1 TABLET BY MOUTH DAILY   Olopatadine HCl 0.2 % Soln Apply 1 drop to eye daily.   omeprazole 40 MG capsule Commonly known as: PRILOSEC TAKE 1 CAPSULE(40 MG) BY MOUTH DAILY   oxyCODONE-acetaminophen 10-325 MG tablet Commonly known as: PERCOCET Take 1 tablet by mouth every 4 (four) hours as needed.   pregabalin 200 MG capsule Commonly known as: LYRICA Take 200 mg by mouth 3 (three) times daily.   ProAir HFA 108 (90 Base) MCG/ACT inhaler Generic drug: albuterol Inhale 1 puff into the lungs every 6 (six) hours as needed for wheezing or shortness of breath. ProAir   albuterol (2.5 MG/3ML) 0.083% nebulizer solution Commonly known as: PROVENTIL Take 2.5 mg by nebulization every 6 (six) hours as needed for wheezing or shortness of breath.   promethazine 25 MG tablet Commonly known as: PHENERGAN Take 25 mg by mouth every 6 (six) hours as needed for nausea or vomiting.   rosuvastatin 10 MG tablet Commonly known as: CRESTOR Take 10 mg by mouth at bedtime.   Spiriva Respimat 2.5 MCG/ACT Aers Generic drug: Tiotropium  Bromide Monohydrate Inhale 2 puffs into the lungs daily.   Spiriva Respimat 2.5 MCG/ACT Aers Generic drug: Tiotropium Bromide Monohydrate Inhale 2 puffs into the lungs daily.   Suprep Bowel Prep Kit 17.5-3.13-1.6 GM/177ML Soln Generic drug: Na Sulfate-K Sulfate-Mg Sulf Take 1 kit by mouth as directed. For colonoscopy prep   tiZANidine 4 MG tablet Commonly known as: ZANAFLEX Take 4 mg by mouth every 6 (six) hours.   Vitamin D (Ergocalciferol) 1.25 MG (50000 UNIT) Caps capsule Commonly known as: DRISDOL Take 50,000 Units by mouth every Tuesday.        OBJECTIVE:   Vital Signs: BP 116/68 (BP Location: Right Arm, Patient Position: Sitting, Cuff Size: Large)   Pulse 78   Temp 98.5 F (36.9 C)   Ht 5' 3"  (1.6 m)   Wt 231 lb (104.8 kg)   SpO2 97%   BMI 40.92 kg/m   Wt Readings from Last 3 Encounters:  08/03/19 231 lb (104.8 kg)  05/03/19 227 lb (103 kg)  04/17/19 230 lb (104.3 kg)     Exam: General: Pt appears well and is in NAD  Lungs: Clear with good BS bilat with no rales, rhonchi, or wheezes  Heart: RRR with normal S1 and S2 and no gallops; no murmurs; no rub  Abdomen: Normoactive bowel sounds, soft, nontender, without masses or organomegaly palpable  Extremities: No pretibial edema.   Skin: Normal texture and temperature to palpation.  Neuro: MS is good with appropriate affect, pt is alert and Ox3    DM foot exam: 05/03/2019  The skin of the feet is intact without sores or ulcerations. The pedal pulses are 2+ on right and 2+ on left. The sensation is intact to a screening 5.07, 10 gram monofilament bilaterally    DATA REVIEWED:  Lab Results  Component Value Date   HGBA1C 6.9 (A) 08/03/2019   HGBA1C 7.4 (A) 05/03/2019   HGBA1C 7.0 (H) 02/07/2016   Lab Results  Component Value Date   MICROALBUR 0.7 05/03/2019   CREATININE 0.96 05/03/2019   Lab Results  Component Value Date   MICRALBCREAT 0.6 05/03/2019  12/15/2018  T.Chol 146 TG 235  HDL 26 LDL 73   ASSESSMENT / PLAN / RECOMMENDATIONS:   1) Type 2 Diabetes Mellitus, Optimally controlled, With neuropathic and macro vascular complications - Most recent A1c of 6.9 %. Goal A1c < 7.0 %.    - A1c at goal, praised pt on current glucose control - No changes at this time  MEDICATIONS: - Humalog Mix  24 units with Breakfast and 22 units with supper   EDUCATION / INSTRUCTIONS:  BG monitoring instructions: Patient is instructed to check her blood sugars 2times a day, fasting and dinner.  Call [facility] if: BG persistently < 70 or > 300. . I reviewed the Rule of 15 for the treatment of hypoglycemia in detail with the patient. Literature supplied.   2) Diabetic complications:   Eye: Does not have known diabetic retinopathy.   Neuro/ Feet: Does have known diabetic peripheral neuropathy .   Renal: Patient does not have known baseline CKD. She   is on an ACEI/ARB at present.      F/U in 4 months    Signed electronically by: Mack Guise, MD  [facility] Endocrinology  [facility] [address] [place], [place] [address] Phone: [phone] FAX: [phone]   CC: [name], NP [place] [address] Phone: [phone]  Fax: [phone]  Return to Endocrinology clinic as below: Future Appointments  Date Time Provider [place]  08/04/2019 10:15 AM [facility] THERAPIST 14 [facility]

## 2019-08-03 NOTE — Patient Instructions (Signed)
-   Humalog Mix  24 units with Breakfast and 22 units with supper  - Check sugar before breakfast and supper    HOW TO TREAT LOW BLOOD SUGARS (Blood sugar LESS THAN 70 MG/DL)  Please follow the RULE OF 15 for the treatment of hypoglycemia treatment (when your (blood sugars are less than 70 mg/dL)    STEP 1: Take 15 grams of carbohydrates when your blood sugar is low, which includes:   3-4 GLUCOSE TABS  OR  3-4 OZ OF JUICE OR REGULAR SODA OR  ONE TUBE OF GLUCOSE GEL     STEP 2: RECHECK blood sugar in 15 MINUTES STEP 3: If your blood sugar is still low at the 15 minute recheck --> then, go back to STEP 1 and treat AGAIN with another 15 grams of carbohydrates.

## 2019-08-04 ENCOUNTER — Ambulatory Visit: Payer: Medicare Other | Admitting: Physical Therapy

## 2019-08-04 ENCOUNTER — Encounter: Payer: Self-pay | Admitting: Internal Medicine

## 2019-08-04 MED ORDER — HUMALOG MIX 75/25 KWIKPEN (75-25) 100 UNIT/ML ~~LOC~~ SUPN: PEN_INJECTOR | SUBCUTANEOUS | 11 refills | Status: DC

## 2019-08-08 ENCOUNTER — Encounter: Payer: Medicare Other | Admitting: Physical Therapy

## 2019-08-14 ENCOUNTER — Ambulatory Visit: Payer: Medicare Other | Attending: Nurse Practitioner | Admitting: Physical Therapy

## 2019-09-13 ENCOUNTER — Ambulatory Visit
Admission: RE | Admit: 2019-09-13 | Discharge: 2019-09-13 | Disposition: A | Discharge status: Home / Self Care | Payer: Medicare Other | Source: Ambulatory Visit | Attending: Nurse Practitioner | Admitting: Nurse Practitioner

## 2019-09-13 ENCOUNTER — Other Ambulatory Visit: Payer: Self-pay | Admitting: Nurse Practitioner

## 2019-09-13 DIAGNOSIS — M25551 Pain in right hip: Secondary | ICD-10-CM

## 2019-09-23 ENCOUNTER — Emergency Department (HOSPITAL_COMMUNITY): Payer: Medicare Other

## 2019-09-23 ENCOUNTER — Emergency Department (HOSPITAL_COMMUNITY)
Admission: EM | Admit: 2019-09-23 | Discharge: 2019-09-23 | Disposition: A | Discharge status: Home / Self Care | Payer: Medicare Other | Attending: Emergency Medicine | Admitting: Emergency Medicine

## 2019-09-23 DIAGNOSIS — N183 Chronic kidney disease, stage 3 unspecified: Secondary | ICD-10-CM | POA: Diagnosis not present

## 2019-09-23 DIAGNOSIS — S29011A Strain of muscle and tendon of front wall of thorax, initial encounter: Secondary | ICD-10-CM | POA: Insufficient documentation

## 2019-09-23 DIAGNOSIS — I5032 Chronic diastolic (congestive) heart failure: Secondary | ICD-10-CM | POA: Insufficient documentation

## 2019-09-23 DIAGNOSIS — X509XXA Other and unspecified overexertion or strenuous movements or postures, initial encounter: Secondary | ICD-10-CM | POA: Insufficient documentation

## 2019-09-23 DIAGNOSIS — Y998 Other external cause status: Secondary | ICD-10-CM | POA: Insufficient documentation

## 2019-09-23 DIAGNOSIS — I130 Hypertensive heart and chronic kidney disease with heart failure and stage 1 through stage 4 chronic kidney disease, or unspecified chronic kidney disease: Secondary | ICD-10-CM | POA: Insufficient documentation

## 2019-09-23 DIAGNOSIS — E1140 Type 2 diabetes mellitus with diabetic neuropathy, unspecified: Secondary | ICD-10-CM | POA: Diagnosis not present

## 2019-09-23 DIAGNOSIS — J449 Chronic obstructive pulmonary disease, unspecified: Secondary | ICD-10-CM | POA: Diagnosis not present

## 2019-09-23 DIAGNOSIS — Z7982 Long term (current) use of aspirin: Secondary | ICD-10-CM | POA: Diagnosis not present

## 2019-09-23 DIAGNOSIS — M549 Dorsalgia, unspecified: Secondary | ICD-10-CM | POA: Insufficient documentation

## 2019-09-23 DIAGNOSIS — S299XXA Unspecified injury of thorax, initial encounter: Secondary | ICD-10-CM | POA: Diagnosis present

## 2019-09-23 DIAGNOSIS — E1122 Type 2 diabetes mellitus with diabetic chronic kidney disease: Secondary | ICD-10-CM | POA: Diagnosis not present

## 2019-09-23 DIAGNOSIS — Z87891 Personal history of nicotine dependence: Secondary | ICD-10-CM | POA: Diagnosis not present

## 2019-09-23 DIAGNOSIS — Z79899 Other long term (current) drug therapy: Secondary | ICD-10-CM | POA: Diagnosis not present

## 2019-09-23 DIAGNOSIS — Y929 Unspecified place or not applicable: Secondary | ICD-10-CM | POA: Insufficient documentation

## 2019-09-23 DIAGNOSIS — Z96652 Presence of left artificial knee joint: Secondary | ICD-10-CM | POA: Diagnosis not present

## 2019-09-23 DIAGNOSIS — Y9389 Activity, other specified: Secondary | ICD-10-CM | POA: Diagnosis not present

## 2019-09-23 LAB — BASIC METABOLIC PANEL WITH GFR
Anion gap: 8 (ref 5–15)
BUN: 20 mg/dL (ref 8–23)
CO2: 27 mmol/L (ref 22–32)
Calcium: 8.9 mg/dL (ref 8.9–10.3)
Chloride: 106 mmol/L (ref 98–111)
Creatinine, Ser: 1.04 mg/dL — ABNORMAL HIGH (ref 0.44–1.00)
GFR calc Af Amer: >60 mL/min
GFR calc non Af Amer: 56 mL/min — ABNORMAL LOW
Glucose, Bld: 154 mg/dL — ABNORMAL HIGH (ref 70–99)
Potassium: 4.2 mmol/L (ref 3.5–5.1)
Sodium: 141 mmol/L (ref 135–145)

## 2019-09-23 LAB — CBC
HCT: 39.5 % (ref 36.0–46.0)
Hemoglobin: 12.2 g/dL (ref 12.0–15.0)
MCH: 30.3 pg (ref 26.0–34.0)
MCHC: 30.9 g/dL (ref 30.0–36.0)
MCV: 98 fL (ref 80.0–100.0)
Platelets: 248 10*3/uL (ref 150–400)
RBC: 4.03 MIL/uL (ref 3.87–5.11)
RDW: 14.8 % (ref 11.5–15.5)
WBC: 9.3 10*3/uL (ref 4.0–10.5)
nRBC: 0 % (ref 0.0–0.2)

## 2019-09-23 LAB — TROPONIN I (HIGH SENSITIVITY)
Troponin I (High Sensitivity): 4 ng/L
Troponin I (High Sensitivity): 4 ng/L (ref <18)

## 2019-09-23 MED ORDER — ACETAMINOPHEN 325 MG PO TABS
650.0000 mg | ORAL_TABLET | Freq: Once | ORAL | Status: DC
  Filled 2019-09-23: qty 2

## 2019-09-23 MED ORDER — SODIUM CHLORIDE 0.9% FLUSH: 3.0000 mL | Freq: Once | INTRAVENOUS | Status: DC

## 2019-09-23 MED ORDER — METHOCARBAMOL 500 MG PO TABS
500.0000 mg | ORAL_TABLET | Freq: Every day | ORAL | 0 refills | Status: DC | PRN
Start: 2019-09-23 — End: 2020-02-15

## 2019-09-23 NOTE — ED Triage Notes (Signed)
Onset 3 days left chest pain radiating to left side of back and shortness of breath.

## 2019-09-23 NOTE — ED Notes (Signed)
Patient verbalizes understanding of discharge instructions. Opportunity for questioning and answers were provided. Armband removed by staff, pt discharged from ED ambulatory.   

## 2019-09-23 NOTE — ED Notes (Signed)
Pt very upset at dc time states that the provider never communicated to her what is wrong with her and that she didn't came to the ED to get pain medication that she had her own pain medication, pt refuses to get her Tylenol ordered. Prescriptions for muscle relaxer given and explained to the pt. Apologist offer to the pt for any miss understanding and offered to talk to the provider prior to her leaving the ED, but the pt refuses at this time.

## 2019-09-23 NOTE — Discharge Instructions (Addendum)
You were seen in the emergency department today for left-sided chest pain and back pain.  Your EKG, chest x-ray, labs all looked good.  This is most likely due to muscle strain.  Stop taking your Zanaflex and start taking Robaxin for your muscle pain, only take this while you are having muscle spasms.  Do not drink alcohol or drive while taking this medication.  Come back to emergency department if you start having any chest pain on exertion, shortness of breath, nausea, vomiting, weakness, trouble walking, passing out worsening symptoms.  Follow-up with your PCP as soon as possible.  Use the guide as needed.  Stop taking Robaxin if you start having any reactions to it, weakness, falling down, dizziness.  Take this medication only when needed, once daily. This is very important!

## 2019-09-23 NOTE — ED Provider Notes (Addendum)
[facility] EMERGENCY DEPARTMENT Provider Note   CSN: [id number] Arrival date & time: 09/23/19  1749     History Chief Complaint  Patient presents with  . Chest Pain    Christina Solis is a 69 y.o. female with pertinent past medical history of COPD, chronic back pain, CHF, arthritis, sickle cell trait, peripheral artery disease, diabetes, CKD stage III presents to the emergency department today for left-sided chest pain and back pain that started 4 days ago.  Patient states that the pain gradually started and has been getting worse. It has been constant for the last three days.  She denies any shortness of breath, chest pain on exertion, pain radiating anywhere, nausea, vomiting, abdominal pain.  Patient states that the last time she felt like this she had pneumonia.  Patient denies any fevers, cough, chills, sore throat states that she has gotten one of her Covid vaccines.  Denies any sick contacts.  Patient states that she feels as if she is having spasms in her left arm.  Denies any numbness or weakness in that arm.  Patient states that she was lifting boxes a couple days ago and then started having the pain.  Patient states that she has been urinating normally, no dysuria or hematuria.  Denies any saddle paresthesias, urinary retention, urinary incontinence, IV drug use, fevers.  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  Patient states that her stool has been normal, no hematochezia or melena.Pt is on chronic Percocet for her fibromyalgia and sates that it has not been controlling her pain.  HPI     Past Medical History:  Diagnosis Date  . Anemia    sickle cell trait  . Anxiety   . Arthritis    "knees" (10/12/2013)  . CHF (congestive heart failure) ([place])   . Chronic back pain   . Chronic bronchitis ([place])    "got it q yr when I lived in [place]"  .  Complication of anesthesia    hard to put under  . COPD (chronic obstructive pulmonary disease) ([place])   . Dysrhythmia   . Family history of anesthesia complication    sister hard to wake up  . Family history of anesthesia complication    niece have itching  . Fibromyalgia   . GERD (gastroesophageal reflux disease)   . Hyperlipidemia   . Hypertension   . Irregular heart beat   . Migraine    "used to get them alot; not regular anymore" (10/12/2013)  . PAD (peripheral artery disease) ([place])   . Pituitary tumor    followed at [facility]; on cabergoline 09/2013  . Pneumonia    "twice"  . Type II diabetes mellitus [facility])     Patient Active Problem List   Diagnosis Date Noted  . COPD, group B, by GOLD 2017 classification ([place]) 07/06/2019  . Multiple lung nodules 07/06/2019  . Diabetes mellitus ([place]) 05/03/2019  . Type 2 diabetes mellitus with diabetic polyneuropathy, with long-term current use of insulin ([place]) 05/03/2019  . Screening for viral disease 03/22/2019  . Encounter for current long term use of antiplatelet drug 03/22/2019  . Esophageal dysphagia 03/22/2019  . Gastroesophageal reflux disease 03/22/2019  . Hx of colonic polyps 03/22/2019  . Atrial tachycardia ([place])   . Left bundle branch block   . Chronic diastolic CHF (congestive heart failure) ([place])   . Uncontrolled  type 2 diabetes mellitus with complication ([place])   . CKD (chronic kidney disease), stage III 02/02/2016  . Anemia, unspecified 04/08/2015  . Hemorrhoids 04/08/2015  . Osteoarthritis of right knee 04/05/2015  . HTN (hypertension) 04/05/2015  . Neuropathy in diabetes ([place]) 04/05/2015  . Hyperlipidemia 04/05/2015  . Pain and swelling of left lower leg 04/05/2015  . Carpal tunnel syndrome of left wrist 12/18/2014  . Pain in left knee 12/18/2014  . Vitamin D deficiency 11/29/2014  . Hyperlipidemia associated with type 2 diabetes mellitus ([place]) 08/31/2014  . Chronic cholecystitis with calculus  09/15/2013  . Disorder of synovium, tendon, and bursa 09/14/2013  . Feces contents abnormal 09/14/2013  . Former smoker 09/14/2013  . Morbid obesity with BMI of 40.0-44.9, adult ([place]) 09/06/2013  . 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 Constipation 11/08/2012  . Insulin dependent diabetes mellitus ([place]) 06/28/2012  . PAD (peripheral artery disease) ([place]) 06/28/2012  . Intermittent claudication ([place]) 06/28/2012  . Fibromyalgia 06/20/2012  . Shoulder pain 06/20/2012  . Lumbar postlaminectomy syndrome 06/20/2012  . Chronic pain 04/20/2012  . Migraine 03/31/2012    Past Surgical History:  Procedure Laterality Date  . ABDOMINAL AORTAGRAM N/A 06/28/2012   Procedure: ABDOMINAL Maxcine Ham;  Surgeon: Laverda Page, MD;  Location: [facility] CATH LAB;  Service: Cardiovascular;  Laterality: N/A;  . APPENDECTOMY    . BACK SURGERY     x 2, lower back  . BILATERAL CARPAL TUNNEL RELEASE    . CARDIAC CATHETERIZATION    . CESAREAN SECTION  1974; 1976; 1978  . CHOLECYSTECTOMY N/A 10/12/2013   Procedure: LAPAROSCOPIC CHOLECYSTECTOMY WITH INTRAOPERATIVE CHOLANGIOGRAM;  Surgeon: [name]. Georgette Dover, MD;  Location: [facility];  Service: General;  Laterality: N/A;  . COLONOSCOPY    . FEMORAL ARTERY STENT Left 06/28/2012   SFA  . LAPAROSCOPIC CHOLECYSTECTOMY  10/12/2013  . LEFT HEART CATHETERIZATION WITH CORONARY ANGIOGRAM N/A 06/28/2012   Procedure: LEFT HEART CATHETERIZATION WITH CORONARY ANGIOGRAM;  Surgeon: Laverda Page, MD;  Location: [facility] CATH LAB;  Service: Cardiovascular;  Laterality: N/A;  . LOWER EXTREMITY ANGIOGRAM N/A 06/28/2012   Procedure: LOWER EXTREMITY ANGIOGRAM;  Surgeon: Laverda Page, MD;  Location: [facility] CATH LAB;  Service: Cardiovascular;  Laterality: N/A;  . PERIPHERAL VASCULAR CATHETERIZATION N/A 09/10/2015   Procedure: Abdominal Aortogram w/Lower Extremity;  Surgeon: Adrian Prows, MD;  Location: [facility] CV LAB;  Service: Cardiovascular;  Laterality:  N/A;  . POSTERIOR LUMBAR FUSION    . REPLACEMENT TOTAL KNEE Left   . TONSILLECTOMY    . TUBAL LIGATION  1978     OB History   No obstetric history on file.     Family History  Problem Relation Age of Onset  . Diabetes Mother   . Kidney disease Mother   . Cancer Father   . Pancreatic cancer Father   . Kidney disease Brother   . Cancer Brother   . Colon cancer Neg Hx   . Inflammatory bowel disease Neg Hx   . Esophageal cancer Neg Hx   . Liver disease Neg Hx   . Rectal cancer Neg Hx   . Stomach cancer Neg Hx     Social History   Tobacco Use  . Smoking status: Former Smoker    Packs/day: 0.50    Years: 46.00    Pack years: 23.00    Types: Cigarettes    Start date: 06/20/2000    Quit date: 09/29/2015    Years since quitting: 3.9  .  Smokeless tobacco: Never Used  Substance Use Topics  . 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 Drug use: No    Home Medications Prior to Admission medications   Medication Sig Start Date End Date Taking? Authorizing Provider  albuterol (PROAIR HFA) 108 (90 BASE) MCG/ACT inhaler Inhale 1 puff into the lungs every 6 (six) hours as needed for wheezing or shortness of breath. ProAir     [provider]  albuterol (PROVENTIL) (2.5 MG/3ML) 0.083% nebulizer solution Take 2.5 mg by nebulization every 6 (six) hours as needed for wheezing or shortness of breath.  06/30/15   [provider]  aspirin EC 81 MG tablet Take 81 mg by mouth daily.     [provider]  benzonatate (TESSALON) 100 MG capsule Take 1 capsule (100 mg total) by mouth every 8 (eight) hours. 08/27/17   Tasia Catchings, Amy V, PA-C  budesonide-formoterol (SYMBICORT) 160-4.5 MCG/ACT inhaler Inhale 2 puffs into the lungs 2 (two) times daily.    [provider]  cetirizine-pseudoephedrine (ZYRTEC-D) 5-120 MG tablet Take 1 tablet by mouth daily. 08/27/17   Tasia Catchings, Amy V, PA-C  clopidogrel (PLAVIX) 75 MG tablet Take 75 mg by mouth daily.  08/15/15    [provider]  diclofenac Sodium (VOLTAREN) 1 % GEL Apply topically.    [provider]  dicyclomine (BENTYL) 20 MG tablet Take 20 mg by mouth 4 (four) times daily as needed for spasms.  09/06/13   [provider]  diltiazem (CARDIZEM CD) 180 MG 24 hr capsule Take 1 capsule (180 mg total) by mouth daily. 02/09/16   Jani Gravel, MD  famotidine (PEPCID) 40 MG tablet Take 40 mg by mouth 2 (two) times daily.    [provider]  ferrous sulfate 325 (65 FE) MG tablet Take 325 mg by mouth daily with breakfast.  07/30/15   [provider]  HUMALOG MIX 75/25 KWIKPEN (75-25) 100 UNIT/ML Kwikpen Inject 24 Units into the skin daily with breakfast AND 22 Units daily with supper. 08/04/19   Shamleffer, Melanie Crazier, MD  lisinopril-hydrochlorothiazide (PRINZIDE,ZESTORETIC) 20-12.5 MG per tablet Take 1 tablet by mouth daily.     [provider]  lubiprostone (AMITIZA) 24 MCG capsule Take 24 mcg by mouth 2 (two) times daily with a meal.    [provider]  meloxicam (MOBIC) 15 MG tablet TAKE 1 TABLET BY MOUTH DAILY 09/26/14   [provider]  methocarbamol (ROBAXIN) 500 MG tablet Take 1 tablet (500 mg total) by mouth daily as needed for muscle spasms. 09/23/19   Zoeie Ritter, PA-C  Na Sulfate-K Sulfate-Mg Sulf (SUPREP BOWEL PREP KIT) 17.5-3.13-1.6 GM/177ML SOLN Take 1 kit by mouth as directed. For colonoscopy prep Patient not taking: Reported on 08/03/2019 03/22/19   Mansouraty, Telford Nab., MD  Olopatadine HCl 0.2 % SOLN Apply 1 drop to eye daily. 08/27/17   Tasia Catchings, Amy V, PA-C  omeprazole (PRILOSEC) 40 MG capsule TAKE 1 CAPSULE(40 MG) BY MOUTH DAILY 07/28/19   Mansouraty, Telford Nab., MD  oxyCODONE-acetaminophen (PERCOCET) 10-325 MG tablet Take 1 tablet by mouth every 4 (four) hours as needed. 04/07/19   [provider]  pregabalin (LYRICA) 200 MG capsule Take 200 mg by mouth 3 (three) times daily.    [provider]  promethazine  (PHENERGAN) 25 MG tablet Take 25 mg by mouth every 6 (six) hours as needed for nausea or vomiting.  07/30/15   [provider]  rosuvastatin (CRESTOR) 10 MG tablet  Take 10 mg by mouth at bedtime.  09/02/15   [provider]  Tiotropium Bromide Monohydrate (SPIRIVA RESPIMAT) 2.5 MCG/ACT AERS Inhale 2 puffs into the lungs daily. 04/17/19   Margaretha Seeds, MD  Tiotropium Bromide Monohydrate (SPIRIVA RESPIMAT) 2.5 MCG/ACT AERS Inhale 2 puffs into the lungs daily. 04/17/19   Margaretha Seeds, MD  Vitamin D, Ergocalciferol, (DRISDOL) 50000 UNITS CAPS capsule Take 50,000 Units by mouth every Tuesday.     [provider]    Allergies    Coconut oil  Review of Systems   Review of Systems  Constitutional: Negative for chills, diaphoresis, fatigue and fever.  HENT: Negative for congestion, sore throat and trouble swallowing.   Eyes: Negative for pain and visual disturbance.  Respiratory: Negative for cough, shortness of breath and wheezing.   Cardiovascular: Positive for chest pain. Negative for palpitations and leg swelling.  Gastrointestinal: Negative for abdominal distention, abdominal pain, diarrhea, nausea and vomiting.  Genitourinary: Negative for difficulty urinating.  Musculoskeletal: Positive for back pain. Negative for neck pain and neck stiffness.  Skin: Negative for pallor.  Neurological: Negative for dizziness, speech difficulty, weakness and headaches.  Psychiatric/Behavioral: Negative for confusion.    Physical Exam Updated Vital Signs BP 130/76 (BP Location: Right Arm)   Pulse 70   Temp 98 F (36.7 C) (Oral)   Resp 18   SpO2 98%   Physical Exam Constitutional:      General: She is not in acute distress.    Appearance: Normal appearance. She is not ill-appearing, toxic-appearing or diaphoretic.  HENT:     Mouth/Throat:     Mouth: Mucous membranes are moist.     Pharynx: Oropharynx is clear.  Eyes:     General: No scleral icterus.     Extraocular Movements: Extraocular movements intact.     Pupils: Pupils are equal, round, and reactive to light.  Cardiovascular:     Rate and Rhythm: Normal rate and regular rhythm.     Pulses: Normal pulses.     Heart sounds: Normal heart sounds.  Pulmonary:     Effort: Pulmonary effort is normal. No respiratory distress.     Breath sounds: Normal breath sounds. No stridor. No wheezing, rhonchi or rales.  Chest:     Chest wall: No tenderness.  Abdominal:     General: Abdomen is flat. There is no distension.     Palpations: Abdomen is soft.     Tenderness: There is no abdominal tenderness. There is no guarding or rebound.  Musculoskeletal:        General: No swelling or tenderness. Normal range of motion.     Cervical back: Normal range of motion and neck supple. No rigidity.     Right lower leg: No edema.     Left lower leg: No edema.     Comments: No skin rash present suggesting shingles.  Extreme reproducible tenderness to serratus muscles of left. Tenderness to that area when manipulating left arm. Pt states she started having muscle spasms when I tried to manipulate her arm. No muscle spasms visualized.No sternum tenderness. No midline tenderness of back. No CVA tenderness.   Skin:    General: Skin is warm and dry.     Capillary Refill: Capillary refill takes less than 2 seconds.     Coloration: Skin is not pale.  Neurological:     General: No focal deficit present.     Mental Status: She is alert and oriented to person, place, and time.  Cranial Nerves: No cranial nerve deficit.     Motor: No weakness.  Psychiatric:        Mood and Affect: Mood normal.        Behavior: Behavior normal.     ED Results / Procedures / Treatments   Labs (all labs ordered are listed, but only abnormal results are displayed) Labs Reviewed  BASIC METABOLIC PANEL - Abnormal; Notable for the following components:      Result Value   Glucose, Bld 154 (*)    Creatinine, Ser 1.04 (*)    GFR  calc non Af Amer 56 (*)    All other components within normal limits  CBC  TROPONIN I (HIGH SENSITIVITY)  TROPONIN I (HIGH SENSITIVITY)    EKG EKG Interpretation  Date/Time:  Saturday Sep 23 2019 21:28:12 EDT Ventricular Rate:  72 PR Interval:  188 QRS Duration: 113 QT Interval:  398 QTC Calculation: 436 R Axis:   42 Text Interpretation: Sinus rhythm Borderline intraventricular conduction delay Borderline low voltage, extremity leads Normal ECG Confirmed by Noemi Chapel [phone]) on 09/23/2019 10:45:00 PM Also confirmed by Noemi Chapel [phone]), editor Gean Quint [phone])  on 09/24/2019 7:49:00 AM   Radiology DG Chest 2 View  Result Date: 09/23/2019 CLINICAL DATA:  Chest pain EXAM: CHEST - 2 VIEW COMPARISON:  Chest radiograph dated 02/05/2016 FINDINGS: The heart size is within normal limits. Vascular calcifications are seen in the aortic arch. Both lungs are clear. The visualized skeletal structures are unremarkable. IMPRESSION: No active cardiopulmonary disease. Electronically Signed   By: Zerita Boers M.D.   On: 09/23/2019 19:29    Procedures Procedures (including critical care time)  Medications Ordered in ED Medications - No data to display  ED Course  I have reviewed the triage vital signs and the nursing notes.  Pertinent labs & imaging results that were available during my care of the patient were reviewed by me and considered in my medical decision making (see chart for details).    MDM Rules/Calculators/A&P                     Christina Solis is a 69 y.o. female with pertinent past medical history of COPD, chronic back pain, CHF, arthritis, sickle cell trait, peripheral artery disease, diabetes, CKD stage III presents to the emergency department today for left-sided chest pain and back pain that started 4 days ago. Chest pain has been constant and serratus muscles are tender to palpation.   Pain is reproducible on left side. Pt has history of lifting boxes the day before  pain started.Pt continuously asking for something for pain.  Patient is currently on daily Percocet due to fibromyalgia.  Diagnoses considered include : Acute coronary syndrome, tamponade, pericarditis/myocarditis, aortic dissection, pulmonary embolism, tension pneumothorax, pneumonia, and esophageal rupture, muscle spasm.   Initial interventions Tylenol given for pain.   ECG interpreted by me demonstrated no change from last time.   CXR interpreted by me demonstrated no acute cardiopulmonary disease.   Labs demonstrated stable CBC and CMP, troponin 4. No need for second troponin at this time since pain has been constant for many days.  Upon reassessment pt is angry she has not gotten anything more for pain, moving in bed without difficulty.  Given the above findings, my suspicion is that pt has muscle strain. Pt has many risk factors for ACS, however doubt unstable angina since pain is reproducible with normal labs.    Patient is to be discharged with recommendation  to follow up with PCP in regards to today's hospital visit. Chest pain is not likely of cardiac or pulmonary etiology d/t presentation, VSS, no tracheal deviation, no JVD or new murmur, RRR, breath sounds equal bilaterally, EKG without acute abnormalities, negative troponin, and negative CXR. Pt is not in respiratory distress and not complaining of shortness of breath, unlikely to be PE, no leg swelling.Pt has been advised to return to the ED if CP becomes exertional, associated with diaphoresis or nausea, radiates to left jaw/arm, worsens or becomes concerning in any way. Pt appears reliable for follow up and is agreeable to discharge.   The plan for this patient was discussed with Dr. Sabra Heck, who voiced agreement and who oversaw evaluation and treatment of this patient.  Of note, pt screaming in the hallway and using rude language at staff for more pain medications. Will not give more pain medications at this time. Discharged her  home with Robaxin and discontinued Zanaflex. Went over risks since this medication is supposed to be avoided in elderly.Spoke to Dr.Miller about this said she was naive to medication since she is on Zanafelx. Discussed to stop using medication if she has any side effects.   Final Clinical Impression(s) / ED Diagnoses Final diagnoses:  Muscle strain of chest wall, initial encounter    Rx / DC Orders ED Discharge Orders         Ordered    methocarbamol (ROBAXIN) 500 MG tablet  Daily PRN     09/23/19 2255           Alfredia Client, PA-C 09/24/19 1325    [address] [place], PA-C 09/24/19 1331    Noemi Chapel, MD 09/24/19 1720

## 2019-09-23 NOTE — ED Provider Notes (Signed)
Medical screening examination/treatment/procedure(s) were conducted as a shared visit with non-physician practitioner(s) and myself.  I personally evaluated the patient during the encounter.  Clinical Impression:   Final diagnoses:  Muscle strain of chest wall, initial encounter    This patient is an obese 69 year old female, she has had pain in her left side which has been going on for several days if not longer.  She reports that it is worse when she raises her left arm or tries to move around even rolling over in the bed causes pain.  She has no changes in the skin and on exam has no rashes, no signs of zoster, she does have reproducible pain with palpation over the chest wall and movement of the left arm.  Her heart and lung exams are unremarkable with no murmurs rubs or gallops and clear lung sounds.  She has no edema of the legs of any significance and is in no distress speaking in full sentences.  EKG reviewed as were the labs, there is no acute findings to suggest ischemia.  No signs of pneumothorax.  Negative troponin, normal x-ray, EKG is unremarkable   EKG Interpretation  Date/Time:  Saturday Sep 23 2019 21:28:12 EDT Ventricular Rate:  72 PR Interval:  188 QRS Duration: 113 QT Interval:  398 QTC Calculation: 436 R Axis:   42 Text Interpretation: Sinus rhythm Borderline intraventricular conduction delay Borderline low voltage, extremity leads Normal ECG Confirmed by Noemi Chapel [phone]) on 09/23/2019 10:45:00 PM Also confirmed by Noemi Chapel [phone]), editor Gean Quint [phone])  on 09/24/2019 7:49:00 AM        This patient will be changed from tizanidine over to Robaxin encouraged to follow-up in the outpatient setting.  Doubt acute ischemia or other pathological causes of chest pain   Noemi Chapel, MD 09/24/19 1720

## 2019-10-14 ENCOUNTER — Other Ambulatory Visit: Payer: Self-pay | Admitting: Pulmonary Disease

## 2019-10-19 ENCOUNTER — Ambulatory Visit: Payer: Medicare Other | Attending: Nurse Practitioner

## 2019-10-19 ENCOUNTER — Other Ambulatory Visit: Payer: Self-pay

## 2019-10-19 DIAGNOSIS — M25512 Pain in left shoulder: Secondary | ICD-10-CM | POA: Insufficient documentation

## 2019-10-19 DIAGNOSIS — G8929 Other chronic pain: Secondary | ICD-10-CM | POA: Insufficient documentation

## 2019-10-19 DIAGNOSIS — M12811 Other specific arthropathies, not elsewhere classified, right shoulder: Secondary | ICD-10-CM | POA: Insufficient documentation

## 2019-10-19 DIAGNOSIS — M25511 Pain in right shoulder: Secondary | ICD-10-CM | POA: Insufficient documentation

## 2019-10-19 DIAGNOSIS — M19012 Primary osteoarthritis, left shoulder: Secondary | ICD-10-CM | POA: Insufficient documentation

## 2019-10-22 NOTE — Therapy (Signed)
[place], [place], [address] Phone: [phone]   Fax:  [phone]  Physical Therapy Evaluation  Patient Details  Name: Christina Solis MRN: [id number] Date of Birth: 1950/07/13 Referring Provider (PT): Marchia Bond, MD   Encounter Date: 10/19/2019  PT End of Session - 10/22/19 2043    Visit Number  1    Number of Visits  13    Date for PT Re-Evaluation  12/10/19    Authorization Type  MEDICAID  [id number]    Authorization - Visit Number  0    Authorization - Number of Visits  3    Progress Note Due on Visit  10    PT Start Time  1222    PT Stop Time  1315    PT Time Calculation (min)  53 min    Equipment Utilized During Treatment  Gait belt    Activity Tolerance  Patient tolerated treatment well    Behavior During Therapy  WFL for tasks assessed/performed       Past Medical History:  Diagnosis Date  . Anemia    sickle cell trait  . Anxiety   . Arthritis    "knees" (10/12/2013)  . CHF (congestive heart failure) ([place])   . Chronic back pain   . Chronic bronchitis ([place])    "got it q yr when I lived in [place]"  . Complication of anesthesia    hard to put under  . COPD (chronic obstructive pulmonary disease) ([place])   . Dysrhythmia   . Family history of anesthesia complication    sister hard to wake up  . Family history of anesthesia complication    niece have itching  . Fibromyalgia   . GERD (gastroesophageal reflux disease)   . Hyperlipidemia   . Hypertension   . Irregular heart beat   . Migraine    "used to get them alot; not regular anymore" (10/12/2013)  . PAD (peripheral artery disease) ([place])   . Pituitary tumor    followed at [facility]; on cabergoline 09/2013  . Pneumonia    "twice"  . Type II diabetes mellitus ([place])     Past Surgical History:  Procedure Laterality Date  . ABDOMINAL AORTAGRAM N/A 06/28/2012   Procedure: ABDOMINAL Maxcine Ham;  Surgeon: Laverda Page, MD;  Location: [facility] CATH LAB;  Service: Cardiovascular;  Laterality: N/A;  . APPENDECTOMY    . BACK SURGERY     x 2, lower back  . BILATERAL CARPAL TUNNEL RELEASE    . CARDIAC CATHETERIZATION    . CESAREAN SECTION  1974; 1976; 1978  . CHOLECYSTECTOMY N/A 10/12/2013   Procedure: LAPAROSCOPIC CHOLECYSTECTOMY WITH INTRAOPERATIVE CHOLANGIOGRAM;  Surgeon: [name]. Georgette Dover, MD;  Location: [place];  Service: General;  Laterality: N/A;  . COLONOSCOPY    . FEMORAL ARTERY STENT Left 06/28/2012   SFA  . LAPAROSCOPIC CHOLECYSTECTOMY  10/12/2013  . LEFT HEART CATHETERIZATION WITH CORONARY ANGIOGRAM N/A 06/28/2012   Procedure: LEFT HEART CATHETERIZATION WITH CORONARY ANGIOGRAM;  Surgeon: Laverda Page, MD;  Location: [facility] CATH LAB;  Service: Cardiovascular;  Laterality: N/A;  . LOWER EXTREMITY ANGIOGRAM N/A 06/28/2012   Procedure: LOWER EXTREMITY ANGIOGRAM;  Surgeon: Laverda Page, MD;  Location: [facility] CATH LAB;  Service: Cardiovascular;  Laterality: N/A;  . PERIPHERAL VASCULAR CATHETERIZATION N/A 09/10/2015   Procedure: Abdominal Aortogram w/Lower Extremity;  Surgeon: Adrian Prows, MD;  Location: [place] CV LAB;  Service: Cardiovascular;  Laterality: N/A;  . POSTERIOR LUMBAR FUSION    .  REPLACEMENT TOTAL KNEE Left   . TONSILLECTOMY    . TUBAL LIGATION  1978    There were no vitals filed for this visit.   Subjective Assessment - 10/22/19 2008    Subjective  Pt requested her L shoulder to evaluated first. Pt reports chronic L shoulder pain for years which resolved, but started bothering her 1 to 2 months ago. Pt reports her L shoulder pain wrosens throughtout the day, with rainy weather, and increased use, i.e, carrying groceries. pocketbook on L shoulder. Pt reports a L shoulder pain range of 3-10/10.    Limitations  House hold activities;Other (comment)    How long can you sit comfortably?  2 hours- back issues, surgeries    How long can you stand comfortably?  15 mins    How long can you walk  comfortably?  15 mins- hips issues    Patient Stated Goals  To have less pain    Currently in Pain?  Yes    Pain Score  5     Pain Location  Shoulder    Pain Orientation  Left    Pain Descriptors / Indicators  Aching;Throbbing    Pain Type  Chronic pain    Pain Radiating Towards  lateral shoulder    Pain Onset  1 to 4 weeks ago    Pain Frequency  Constant    Aggravating Factors   Wrosens throughtout the day, with rainy weather, and increased use, i.e, carrying groceries. pocketbook on L shoulder.    Pain Relieving Factors  Rest, meds    Effect of Pain on Daily Activities  Significnatly limits         [facility] PT Assessment - 10/22/19 0001      Assessment   Medical Diagnosis  L shoulder OA; R shoulder RCT    Referring Provider (PT)  Marchia Bond, MD    Onset Date/Surgical Date  --   1 to 2 months     Precautions   Precautions  None      Restrictions   Weight Bearing Restrictions  No      Balance Screen   Has the patient fallen in the past 6 months  No    Has the patient had a decrease in activity level because of a fear of falling?   No    Is the patient reluctant to leave their home because of a fear of falling?   No      Home Environment   Living Environment  Private residence    Living Arrangements  Children    Type of [place] to enter    Entrance Stairs-Number of Steps  2    Entrance Stairs-Rails  Can reach both      Prior Function   Level of Independence  Independent    Vocation  Retired      Associate Professor   Overall Cognitive Status  Within Functional Limits for tasks assessed      Observation/Other Assessments   Focus on Therapeutic Outcomes (FOTO)   NA      Sensation   Light Touch  Appears Intact      Coordination   Gross Motor Movements are Fluid and Coordinated  Yes      Posture/Postural Control   Posture/Postural Control  Postural limitations    Postural Limitations  Forward head;Rounded Shoulders      ROM / Strength    AROM / PROM / Strength  AROM;Strength  AROM   Overall AROM Comments  For the L shoulder all movements provoked pain min/mod    AROM Assessment Site  Shoulder    Right/Left Shoulder  Left    Left Shoulder Extension  130 Degrees    Left Shoulder Flexion  110 Degrees    Left Shoulder ABduction  40 Degrees    Left Shoulder Internal Rotation  90 Degrees    Left Shoulder External Rotation  50 Degrees      Strength   Overall Strength Comments  For the L shoulder all strength tests provoked pain min/mod     Strength Assessment Site  Shoulder    Right/Left Shoulder  Left    Left Shoulder Flexion  5/5    Left Shoulder Extension  5/5    Left Shoulder ABduction  5/5    Left Shoulder Internal Rotation  5/5    Left Shoulder External Rotation  5/5      Palpation   Palpation comment  Pt was significantly TTP throughout the [facility] area, upper trapezius, and medial border of the scapula      Special Tests    Special Tests  Rotator Cuff Impingement;Biceps/Labral Tests;Laxity/Instability Tests    Rotator Cuff Impingment tests  Michel Bickers test;Empty Can test;Full Can test    Biceps/Labral tests  Yergason's Test      Hawkins-Kennedy test   Findings  Negative    Side  Left      Empty Can test   Findings  Negative    Side  Left      Full Can test   Findings  Negative    Side  Left      Yergason's Test   Findings  Negative    Side  Left      Transfers   Transfers  Sit to Stand;Stand to Sit      Ambulation/Gait   Ambulation/Gait  Yes    Ambulation/Gait Assistance  7: Independent                  Objective measurements completed on examination: See above findings.              PT Education - 10/22/19 2042    Education Details  Eval findings, POC, HEP, instruction re: posture, positioning, and support to reduce pain.Terence Lux) Educated  Patient    Methods  Explanation;Demonstration;Tactile cues;Verbal cues;Handout    Comprehension  Verbalized  understanding;Returned demonstration;Verbal cues required;Tactile cues required;Need further instruction       PT Short Term Goals - 10/22/19 2142      PT SHORT TERM GOAL #1   Title  Pt will be Ind in an initial HEP    Baseline  In progress    Time  3    Period  Weeks    Status  New    Target Date  11/12/19      PT SHORT TERM GOAL #2   Title  Pt will voice understanding of measures to address and reduce shoulder pain    Baseline  Decreased understanding    Time  3    Period  Weeks    Status  New    Target Date  11/12/19      PT SHORT TERM GOAL #3   Title  Complete R shoulder eval and set goals    Time  3    Period  Weeks    Status  New    Target Date  11/12/19  PT Long Term Goals - 10/22/19 2154      PT LONG TERM GOAL #1   Title  Pt will report an improved L shoulder pain range of 0-4 with daily activities    Baseline  3-10/10    Time  7    Period  Weeks    Status  New    Target Date  12/10/19      PT LONG TERM GOAL #2   Title  Pt will be Ind in a final HEP    Baseline  In progress    Time  7    Period  Weeks    Status  New    Target Date  12/10/19             Plan - 10/22/19 2101    Clinical Impression Statement  Pt presents a a chronic hx of L shoulder pain. Pt eval revealed pt's pain to be most significantly provoked by light palpation throughout L GH, upper trapezius, and medial to the scapular border. Pt's L shoulder pain was also provoked by all active and resistive movements. ROMs and strengths were found WFLs. Pt will benefit from PT to address posture, decrease pain, and improve function.    Personal Factors and Comorbidities  Comorbidity 3+;Past/Current Experience    Comorbidities  CHF, COPD, fibromyalgia, PAD, DM2, anxiety    Examination-Activity Limitations  Lift;Sleep;Reach Overhead    Stability/Clinical Decision Making  Evolving/Moderate complexity    Clinical Decision Making  Moderate    Rehab Potential  Fair    PT Frequency  2x /  week    PT Duration  6 weeks    PT Treatment/Interventions  ADLs/Self Care Home Management;Cryotherapy;Electrical Stimulation;Moist Heat;Iontophoresis 4mg /ml Dexamethasone;Therapeutic activities;Therapeutic exercise;Manual techniques;Patient/family education;Dry needling;Joint Manipulations;Taping    PT Next Visit Plan  Assess response to HEP. Adjust HEP as insdicated, Eval R shoulder, use of modalitieis as indicated.    PT Home Exercise Plan  [id number]. Pendulum exs, cervical retractions, scapular retractios    Consulted and Agree with Plan of Care  Patient       Patient will benefit from skilled therapeutic intervention in order to improve the following deficits and impairments:  Decreased mobility, Decreased activity tolerance, Decreased strength, Postural dysfunction, Pain, Obesity, Impaired UE functional use  Visit Diagnosis: Chronic pain of both shoulders - Plan: PT plan of care cert/re-cert  Localized osteoarthritis of left shoulder - Plan: PT plan of care cert/re-cert  Rotator cuff arthropathy of right shoulder - Plan: PT plan of care cert/re-cert     Problem List Patient Active Problem List   Diagnosis Date Noted  . COPD, group B, by GOLD 2017 classification ([place]) 07/06/2019  . Multiple lung nodules 07/06/2019  . Diabetes mellitus ([place]) 05/03/2019  . Type 2 diabetes mellitus with diabetic polyneuropathy, with long-term current use of insulin ([place]) 05/03/2019  . Screening for viral disease 03/22/2019  . Encounter for current long term use of antiplatelet drug 03/22/2019  . Esophageal dysphagia 03/22/2019  . Gastroesophageal reflux disease 03/22/2019  . Hx of colonic polyps 03/22/2019  . Atrial tachycardia ([place])   . Left bundle branch block   . Chronic diastolic CHF (congestive heart failure) ([place])   . Uncontrolled type 2 diabetes mellitus with complication ([place])   . CKD (chronic kidney disease), stage III 02/02/2016  . Anemia, unspecified 04/08/2015  . Hemorrhoids  04/08/2015  . Osteoarthritis of right knee 04/05/2015  . HTN (hypertension) 04/05/2015  . Neuropathy in diabetes ([place]) 04/05/2015  . Hyperlipidemia 04/05/2015  .  Pain and swelling of left lower leg 04/05/2015  . Carpal tunnel syndrome of left wrist 12/18/2014  . Pain in left knee 12/18/2014  . Vitamin D deficiency 11/29/2014  . Hyperlipidemia associated with type 2 diabetes mellitus ([place]) 08/31/2014  . Chronic cholecystitis with calculus 09/15/2013  . Disorder of synovium, tendon, and bursa 09/14/2013  . Feces contents abnormal 09/14/2013  . Former smoker 09/14/2013  . Morbid obesity with BMI of 40.0-44.9, adult ([place]) 09/06/2013  . 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 Constipation 11/08/2012  . Insulin dependent diabetes mellitus ([place]) 06/28/2012  . PAD (peripheral artery disease) ([place]) 06/28/2012  . Intermittent claudication ([place]) 06/28/2012  . Fibromyalgia 06/20/2012  . Shoulder pain 06/20/2012  . Lumbar postlaminectomy syndrome 06/20/2012  . Chronic pain 04/20/2012  . Migraine 03/31/2012    Gar Ponto MS, PT 10/22/19 10:17 PM  [facility] [address] [place], [place], [address] Phone: [phone]   Fax:  [phone]  Name: Christina Solis MRN: [id number] Date of Birth: [birth year]

## 2019-10-25 ENCOUNTER — Ambulatory Visit
Admission: RE | Admit: 2019-10-25 | Discharge: 2019-10-25 | Disposition: A | Discharge status: Home / Self Care | Payer: Medicaid Other | Source: Ambulatory Visit | Attending: Pain Medicine | Admitting: Pain Medicine

## 2019-10-25 ENCOUNTER — Other Ambulatory Visit: Payer: Self-pay | Admitting: Pain Medicine

## 2019-10-25 DIAGNOSIS — M542 Cervicalgia: Secondary | ICD-10-CM

## 2019-11-01 ENCOUNTER — Other Ambulatory Visit: Payer: Self-pay

## 2019-11-01 ENCOUNTER — Ambulatory Visit: Payer: Medicare Other

## 2019-11-01 DIAGNOSIS — M12811 Other specific arthropathies, not elsewhere classified, right shoulder: Secondary | ICD-10-CM

## 2019-11-01 DIAGNOSIS — M25511 Pain in right shoulder: Secondary | ICD-10-CM

## 2019-11-01 DIAGNOSIS — M19012 Primary osteoarthritis, left shoulder: Secondary | ICD-10-CM

## 2019-11-01 NOTE — Therapy (Signed)
[place], [place], [address] Phone: [phone]   Fax:  [phone]  Physical Therapy Treatment  Patient Details  Name: SHLEY DOLBY MRN: [id number] Date of Birth: [birth year] Referring Provider (PT): Marchia Bond, MD   Encounter Date: 11/01/2019   PT End of Session - 11/01/19 1819    Visit Number 2    Number of Visits 13    Date for PT Re-Evaluation 12/10/19    Authorization Type [place] - Visit Number 1    Authorization - Number of Visits 3    Progress Note Due on Visit 10    PT Start Time 1717    PT Stop Time 1803    PT Time Calculation (min) 46 min    Activity Tolerance Patient tolerated treatment well    Behavior During Therapy [facility] for tasks assessed/performed           Past Medical History:  Diagnosis Date  . Anemia    sickle cell trait  . Anxiety   . Arthritis    "knees" (10/12/2013)  . CHF (congestive heart failure) ([place])   . Chronic back pain   . Chronic bronchitis ([place])    "got it q yr when I lived in [place]"  . Complication of anesthesia    hard to put under  . COPD (chronic obstructive pulmonary disease) ([place])   . Dysrhythmia   . Family history of anesthesia complication    sister hard to wake up  . Family history of anesthesia complication    niece have itching  . Fibromyalgia   . GERD (gastroesophageal reflux disease)   . Hyperlipidemia   . Hypertension   . Irregular heart beat   . Migraine    "used to get them alot; not regular anymore" (10/12/2013)  . PAD (peripheral artery disease) ([place])   . Pituitary tumor    followed at [facility]; on cabergoline 09/2013  . Pneumonia    "twice"  . Type II diabetes mellitus ([place])     Past Surgical History:  Procedure Laterality Date  . ABDOMINAL AORTAGRAM N/A 06/28/2012   Procedure: ABDOMINAL Maxcine Ham;  Surgeon: Laverda Page, MD;  Location: [facility] CATH LAB;   Service: Cardiovascular;  Laterality: N/A;  . APPENDECTOMY    . BACK SURGERY     x 2, lower back  . BILATERAL CARPAL TUNNEL RELEASE    . CARDIAC CATHETERIZATION    . CESAREAN SECTION  1974; 1976; 1978  . CHOLECYSTECTOMY N/A 10/12/2013   Procedure: LAPAROSCOPIC CHOLECYSTECTOMY WITH INTRAOPERATIVE CHOLANGIOGRAM;  Surgeon: [name]. Georgette Dover, MD;  Location: [place];  Service: General;  Laterality: N/A;  . COLONOSCOPY    . FEMORAL ARTERY STENT Left 06/28/2012   SFA  . LAPAROSCOPIC CHOLECYSTECTOMY  10/12/2013  . LEFT HEART CATHETERIZATION WITH CORONARY ANGIOGRAM N/A 06/28/2012   Procedure: LEFT HEART CATHETERIZATION WITH CORONARY ANGIOGRAM;  Surgeon: Laverda Page, MD;  Location: [facility] CATH LAB;  Service: Cardiovascular;  Laterality: N/A;  . LOWER EXTREMITY ANGIOGRAM N/A 06/28/2012   Procedure: LOWER EXTREMITY ANGIOGRAM;  Surgeon: Laverda Page, MD;  Location: [facility] CATH LAB;  Service: Cardiovascular;  Laterality: N/A;  . PERIPHERAL VASCULAR CATHETERIZATION N/A 09/10/2015   Procedure: Abdominal Aortogram w/Lower Extremity;  Surgeon: Adrian Prows, MD;  Location: [place] CV LAB;  Service: Cardiovascular;  Laterality: N/A;  . POSTERIOR LUMBAR FUSION    . REPLACEMENT TOTAL KNEE Left   . TONSILLECTOMY    .  TUBAL LIGATION  1978    There were no vitals filed for this visit.   Subjective Assessment - 11/02/19 0605    Subjective Pt reports the pain of both her shoulders is improved. Pt currently rates B sh pain as 3/10.    Currently in Pain? Yes    Pain Score 3     Pain Location Shoulder    Pain Orientation Right;Left    Pain Descriptors / Indicators Aching;Throbbing    Pain Type Chronic pain    Pain Onset 1 to 4 weeks ago    Pain Frequency Constant                             [facility] Adult PT Treatment/Exercise - 11/02/19 0001      Exercises   Exercises Shoulder      Shoulder Exercises: Seated   External Rotation Strengthening;Both;10 reps;Theraband    Theraband Level  (Shoulder External Rotation) Level 3 (Green)    External Rotation Limitations 2 sets    Flexion AAROM;Strengthening;Both;10 reps    Flexion Limitations 2 sets      Shoulder Exercises: Standing   Flexion AAROM;Strengthening;Right;Left;10 reps    Flexion Limitations c wand; 2 sets      Shoulder Exercises: Pulleys   Flexion 1 minute                  PT Education - 11/01/19 1816    Education Details HEP: Sh ER/scapular retraction and supine sh flexion was added to HEP. Reviewed support and positioning of the UEs for stress and pain reduction.    Person(s) Educated Patient    Methods Explanation;Demonstration;Tactile cues;Verbal cues;Handout    Comprehension Verbalized understanding;Returned demonstration;Verbal cues required;Tactile cues required            PT Short Term Goals - 10/22/19 2142      PT SHORT TERM GOAL #1   Title Pt will be Ind in an initial HEP    Baseline In progress    Time 3    Period Weeks    Status New    Target Date 11/12/19      PT SHORT TERM GOAL #2   Title Pt will voice understanding of measures to address and reduce shoulder pain    Baseline Decreased understanding    Time 3    Period Weeks    Status New    Target Date 11/12/19      PT SHORT TERM GOAL #3   Title Complete R shoulder eval and set goals    Time 3    Period Weeks    Status New    Target Date 11/12/19             PT Long Term Goals - 11/01/19 1829      PT LONG TERM GOAL #1   Title Pt will report an improved bilat shoulder pain range of 0-4 with daily activities    Baseline 3-10/10    Time 7    Period Weeks    Status New    Target Date 12/10/19      PT LONG TERM GOAL #2   Title Pt will be Ind in a final HEP    Baseline In progress    Time 7    Period Weeks    Status New    Target Date 12/10/19                 Plan -  11/01/19 1821    Clinical Impression Statement The R shoulder presents with good ROM and strength with all motions causing min  discomfort. Pt's pain is primarily provoked by palpation on the post/lat [place] area. Since the eval pt reports good improvement in the pain of both shoulders (pt has applied exs for the L sh to the R). Today, pt's ther ex/HEP was progressed c the addition of AAROM, AROM and strengthening exs.    Personal Factors and Comorbidities Comorbidity 3+;Past/Current Experience    Comorbidities CHF, COPD, fibromyalgia, PAD, DM2, anxiety    Examination-Activity Limitations Lift;Sleep;Reach Overhead    Stability/Clinical Decision Making Evolving/Moderate complexity    Clinical Decision Making Moderate    Rehab Potential Fair    PT Frequency 2x / week    PT Duration 6 weeks    PT Treatment/Interventions ADLs/Self Care Home Management;Cryotherapy;Electrical Stimulation;Moist Heat;Iontophoresis 4mg /ml Dexamethasone;Therapeutic activities;Therapeutic exercise;Manual techniques;Patient/family education;Dry needling;Joint Manipulations;Taping    PT Next Visit Plan Assess response to new exs for the HEP.    PT Home Exercise Plan [id number].Cervical retractions were placed on hold. Supine sh flex c wand was added as well as Sh ER/retraction c Tband.           Patient will benefit from skilled therapeutic intervention in order to improve the following deficits and impairments:  Decreased mobility, Decreased activity tolerance, Decreased strength, Postural dysfunction, Pain, Obesity, Impaired UE functional use  Visit Diagnosis: Chronic pain of both shoulders  Localized osteoarthritis of left shoulder  Rotator cuff arthropathy of right shoulder     Problem List Patient Active Problem List   Diagnosis Date Noted  . COPD, group B, by GOLD 2017 classification ([place]) 07/06/2019  . Multiple lung nodules 07/06/2019  . Diabetes mellitus ([place]) 05/03/2019  . Type 2 diabetes mellitus with diabetic polyneuropathy, with long-term current use of insulin ([place]) 05/03/2019  . Screening for viral disease 03/22/2019  .  Encounter for current long term use of antiplatelet drug 03/22/2019  . Esophageal dysphagia 03/22/2019  . Gastroesophageal reflux disease 03/22/2019  . Hx of colonic polyps 03/22/2019  . Atrial tachycardia ([place])   . Left bundle branch block   . Chronic diastolic CHF (congestive heart failure) ([place])   . Uncontrolled type 2 diabetes mellitus with complication ([place])   . CKD (chronic kidney disease), stage III 02/02/2016  . Anemia, unspecified 04/08/2015  . Hemorrhoids 04/08/2015  . Osteoarthritis of right knee 04/05/2015  . HTN (hypertension) 04/05/2015  . Neuropathy in diabetes ([place]) 04/05/2015  . Hyperlipidemia 04/05/2015  . Pain and swelling of left lower leg 04/05/2015  . Carpal tunnel syndrome of left wrist 12/18/2014  . Pain in left knee 12/18/2014  . Vitamin D deficiency 11/29/2014  . Hyperlipidemia associated with type 2 diabetes mellitus ([place]) 08/31/2014  . Chronic cholecystitis with calculus 09/15/2013  . Disorder of synovium, tendon, and bursa 09/14/2013  . Feces contents abnormal 09/14/2013  . Former smoker 09/14/2013  . Morbid obesity with BMI of 40.0-44.9, adult ([place]) 09/06/2013  . 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 Constipation 11/08/2012  . Insulin dependent diabetes mellitus ([place]) 06/28/2012  . PAD (peripheral artery disease) ([place]) 06/28/2012  . Intermittent claudication ([place]) 06/28/2012  . Fibromyalgia 06/20/2012  . Shoulder pain 06/20/2012  . Lumbar postlaminectomy syndrome 06/20/2012  . Chronic pain 04/20/2012  . Migraine 03/31/2012   Gar Ponto MS, PT 11/02/19 6:14 AM  [facility] [address] [place], [place], [address] Phone: [phone]  Fax:  [phone]  Name: MARISHKA RENTFROW MRN: [id number] Date of Birth: 09-06-1950

## 2019-11-03 ENCOUNTER — Other Ambulatory Visit: Payer: Self-pay

## 2019-11-03 ENCOUNTER — Ambulatory Visit: Payer: Medicare Other

## 2019-11-03 DIAGNOSIS — M19012 Primary osteoarthritis, left shoulder: Secondary | ICD-10-CM

## 2019-11-03 DIAGNOSIS — M25511 Pain in right shoulder: Secondary | ICD-10-CM

## 2019-11-03 DIAGNOSIS — M12811 Other specific arthropathies, not elsewhere classified, right shoulder: Secondary | ICD-10-CM

## 2019-11-05 NOTE — Therapy (Signed)
[place], [place], [address] Phone: [phone]   Fax:  [phone]  Physical Therapy Treatment  Patient Details  Name: Christina Solis MRN: [id number] Date of Birth: March 20, 1951 Referring Provider (PT): Marchia Bond, MD   Encounter Date: 11/03/2019   PT End of Session - 11/05/19 2213    Visit Number 3    Number of Visits 13    Date for PT Re-Evaluation 12/10/19    Authorization Type [facility] MEDICARE/MEDICAID [place] ACCESS    Progress Note Due on Visit 10    PT Start Time 1137    PT Stop Time 1226    PT Time Calculation (min) 49 min    Activity Tolerance Patient tolerated treatment well    Behavior During Therapy [facility] for tasks assessed/performed           Past Medical History:  Diagnosis Date   Anemia    sickle cell trait   Anxiety    Arthritis    "knees" (10/12/2013)   CHF (congestive heart failure) ([place])    Chronic back pain    Chronic bronchitis ([place])    "got it q yr when I lived in [place]"   Complication of anesthesia    hard to put under   COPD (chronic obstructive pulmonary disease) ([place])    Dysrhythmia    Family history of anesthesia complication    sister hard to wake up   Family history of anesthesia complication    niece have itching   Fibromyalgia    GERD (gastroesophageal reflux disease)    Hyperlipidemia    Hypertension    Irregular heart beat    Migraine    "used to get them alot; not regular anymore" (10/12/2013)   PAD (peripheral artery disease) ([place])    Pituitary tumor    followed at [facility]; on cabergoline 09/2013   Pneumonia    "twice"   Type II diabetes mellitus ([place])     Past Surgical History:  Procedure Laterality Date   ABDOMINAL AORTAGRAM N/A 06/28/2012   Procedure: ABDOMINAL Maxcine Ham;  Surgeon: Laverda Page, MD;  Location: [facility] CATH LAB;  Service: Cardiovascular;  Laterality: N/A;   APPENDECTOMY     BACK SURGERY      x 2, lower back   BILATERAL CARPAL TUNNEL RELEASE     CARDIAC CATHETERIZATION     CESAREAN SECTION  1974; 1976; [place] N/A 10/12/2013   Procedure: LAPAROSCOPIC CHOLECYSTECTOMY WITH INTRAOPERATIVE CHOLANGIOGRAM;  Surgeon: [name]. Georgette Dover, MD;  Location: [place];  Service: General;  Laterality: N/A;   COLONOSCOPY     FEMORAL ARTERY STENT Left 06/28/2012   SFA   LAPAROSCOPIC CHOLECYSTECTOMY  10/12/2013   LEFT HEART CATHETERIZATION WITH CORONARY ANGIOGRAM N/A 06/28/2012   Procedure: LEFT HEART CATHETERIZATION WITH CORONARY ANGIOGRAM;  Surgeon: Laverda Page, MD;  Location: [facility] CATH LAB;  Service: Cardiovascular;  Laterality: N/A;   LOWER EXTREMITY ANGIOGRAM N/A 06/28/2012   Procedure: LOWER EXTREMITY ANGIOGRAM;  Surgeon: Laverda Page, MD;  Location[facility] CATH LAB;  Service: Cardiovascular;  Laterality: N/A;   PERIPHERAL VASCULAR CATHETERIZATION N/A 09/10/2015   Procedure: Abdominal Aortogram w/Lower Extremity;  Surgeon: Adrian Prows, MD;  Location: [place] CV LAB;  Service: Cardiovascular;  Laterality: N/A;   POSTERIOR LUMBAR FUSION     REPLACEMENT TOTAL KNEE Left    TONSILLECTOMY     TUBAL LIGATION  1978    There were no vitals filed for this visit.  Subjective Assessment - 11/05/19 2212    Subjective Pt states she has been doing her HEP alot and her shoulders are more sore. Pt rates her bilat shoulder pain a 5/10.    Currently in Pain? Yes    Pain Score 5     Pain Location Shoulder    Pain Orientation Right;Left    Pain Descriptors / Indicators Throbbing    Pain Type Chronic pain    Pain Onset 1 to 4 weeks ago    Pain Frequency Constant                             [facility] Adult PT Treatment/Exercise - 11/05/19 0001      Exercises   Exercises Shoulder      Shoulder Exercises: Supine   Flexion AROM;Both;10 reps    Flexion Limitations wand    Other Supine Exercises Cervical retractions      Shoulder Exercises: Seated   Other  Seated Exercises Scapular retraction 10x      Shoulder Exercises: Pulleys   Flexion 1 minute    Scaption 1 minute      Shoulder Exercises: ROM/Strengthening   Pendulum 10 reps for flex/ext; abd/add, cirlcles ; L ans R      Modalities   Modalities Cryotherapy      Cryotherapy   Number Minutes Cryotherapy 10 Minutes    Cryotherapy Location Shoulder   Bilat   Type of Cryotherapy Ice pack                  PT Education - 11/05/19 2231    Education Details HEP: Pt was advised to complete only scapular retractions 5-10 reps, 6x a day. Allother ex complete 10-15 reps, 1-2 x/day.            PT Short Term Goals - 10/22/19 2142      PT SHORT TERM GOAL #1   Title Pt will be Ind in an initial HEP    Baseline In progress    Time 3    Period Weeks    Status New    Target Date 11/12/19      PT SHORT TERM GOAL #2   Title Pt will voice understanding of measures to address and reduce shoulder pain    Baseline Decreased understanding    Time 3    Period Weeks    Status New    Target Date 11/12/19      PT SHORT TERM GOAL #3   Title Complete R shoulder eval and set goals    Time 3    Period Weeks    Status New    Target Date 11/12/19             PT Long Term Goals - 11/01/19 1829      PT LONG TERM GOAL #1   Title Pt will report an improved bilat shoulder pain range of 0-4 with daily activities    Baseline 3-10/10    Time 7    Period Weeks    Status New    Target Date 12/10/19      PT LONG TERM GOAL #2   Title Pt will be Ind in a final HEP    Baseline In progress    Time 7    Period Weeks    Status New    Target Date 12/10/19  Plan - 11/05/19 2215    Clinical Impression Statement Pt presents with increase bilat shoulder pain today appearing related to pt. completing her HEP many times a day. This is per pt's report. Pt's ther ex was completed at an appropriate level and the pt then received cold packs to both shoulders. Education was  provided on the appropriate amount of reps and times/day to be completed with her HEP.    Personal Factors and Comorbidities Comorbidity 3+;Past/Current Experience    Comorbidities CHF, COPD, fibromyalgia, PAD, DM2, anxiety    Examination-Activity Limitations Lift;Sleep;Reach Overhead    Stability/Clinical Decision Making Evolving/Moderate complexity    Clinical Decision Making Moderate    Rehab Potential Fair    PT Frequency 2x / week    PT Duration 6 weeks    PT Treatment/Interventions ADLs/Self Care Home Management;Cryotherapy;Electrical Stimulation;Moist Heat;Iontophoresis 4mg /ml Dexamethasone;Therapeutic activities;Therapeutic exercise;Manual techniques;Patient/family education;Dry needling;Joint Manipulations;Taping    PT Next Visit Plan Assess pt's response to the reduction in the reps and times/day for her HEP.    PT Home Exercise Plan [id number]           Patient will benefit from skilled therapeutic intervention in order to improve the following deficits and impairments:  Decreased mobility, Decreased activity tolerance, Decreased strength, Postural dysfunction, Pain, Obesity, Impaired UE functional use  Visit Diagnosis: Chronic pain of both shoulders  Localized osteoarthritis of left shoulder  Rotator cuff arthropathy of right shoulder     Problem List Patient Active Problem List   Diagnosis Date Noted   COPD, group B, by GOLD 2017 classification ([place]) 07/06/2019   Multiple lung nodules 07/06/2019   Diabetes mellitus ([place]) 05/03/2019   Type 2 diabetes mellitus with diabetic polyneuropathy, with long-term current use of insulin ([place]) 05/03/2019   Screening for viral disease 03/22/2019   Encounter for current long term use of antiplatelet drug 03/22/2019   Esophageal dysphagia 03/22/2019   Gastroesophageal reflux disease 03/22/2019   Hx of colonic polyps 03/22/2019   Atrial tachycardia (HCC)    Left bundle branch block    Chronic diastolic CHF (congestive  heart failure) ([place])    Uncontrolled type 2 diabetes mellitus with complication ([place])    CKD (chronic kidney disease), stage III 02/02/2016   Anemia, unspecified 04/08/2015   Hemorrhoids 04/08/2015   Osteoarthritis of right knee 04/05/2015   HTN (hypertension) 04/05/2015   Neuropathy in diabetes ([place]) 04/05/2015   Hyperlipidemia 04/05/2015   Pain and swelling of left lower leg 04/05/2015   Carpal tunnel syndrome of left wrist 12/18/2014   Pain in left knee 12/18/2014   Vitamin D deficiency 11/29/2014   Hyperlipidemia associated with type 2 diabetes mellitus ([place]) 08/31/2014   Chronic cholecystitis with calculus 09/15/2013   Disorder of synovium, tendon, and bursa 09/14/2013   Feces contents abnormal 09/14/2013   Former smoker 09/14/2013   Morbid obesity with BMI of 40.0-44.9, adult ([place]) 09/06/2013   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

## 2019-11-07 ENCOUNTER — Other Ambulatory Visit: Payer: Self-pay

## 2019-11-07 ENCOUNTER — Ambulatory Visit: Payer: Medicare Other

## 2019-11-07 DIAGNOSIS — M25511 Pain in right shoulder: Secondary | ICD-10-CM | POA: Diagnosis not present

## 2019-11-07 DIAGNOSIS — M12811 Other specific arthropathies, not elsewhere classified, right shoulder: Secondary | ICD-10-CM

## 2019-11-07 DIAGNOSIS — M19012 Primary osteoarthritis, left shoulder: Secondary | ICD-10-CM

## 2019-11-08 NOTE — Therapy (Signed)
[place], [place], [address] Phone: [phone]   Fax:  [phone]  Physical Therapy Treatment  Patient Details  Name: Christina Solis MRN: [id number] Date of Birth: 1950/07/04 Referring Provider (PT): Marchia Bond, MD   Encounter Date: 11/07/2019   PT End of Session - 11/08/19 0535    Visit Number 4    Number of Visits 13    Date for PT Re-Evaluation 12/10/19    Authorization Type [facility] MEDICARE/MEDICAID [place] ACCESS    Progress Note Due on Visit 10    PT Start Time 9417    PT Stop Time 1401    PT Time Calculation (min) 44 min    Activity Tolerance Patient tolerated treatment well    Behavior During Therapy [facility] for tasks assessed/performed           Past Medical History:  Diagnosis Date  . Anemia    sickle cell trait  . Anxiety   . Arthritis    "knees" (10/12/2013)  . CHF (congestive heart failure) ([place])   . Chronic back pain   . Chronic bronchitis ([place])    "got it q yr when I lived in [place]"  . Complication of anesthesia    hard to put under  . COPD (chronic obstructive pulmonary disease) ([place])   . Dysrhythmia   . Family history of anesthesia complication    sister hard to wake up  . Family history of anesthesia complication    niece have itching  . Fibromyalgia   . GERD (gastroesophageal reflux disease)   . Hyperlipidemia   . Hypertension   . Irregular heart beat   . Migraine    "used to get them alot; not regular anymore" (10/12/2013)  . PAD (peripheral artery disease) ([place])   . Pituitary tumor    followed at [facility]; on cabergoline 09/2013  . Pneumonia    "twice"  . Type II diabetes mellitus ([place])     Past Surgical History:  Procedure Laterality Date  . ABDOMINAL AORTAGRAM N/A 06/28/2012   Procedure: ABDOMINAL Maxcine Ham;  Surgeon: Laverda Page, MD;  Location: [facility] CATH LAB;  Service: Cardiovascular;  Laterality: N/A;  . APPENDECTOMY    . BACK SURGERY      x 2, lower back  . BILATERAL CARPAL TUNNEL RELEASE    . CARDIAC CATHETERIZATION    . CESAREAN SECTION  1974; 1976; 1978  . CHOLECYSTECTOMY N/A 10/12/2013   Procedure: LAPAROSCOPIC CHOLECYSTECTOMY WITH INTRAOPERATIVE CHOLANGIOGRAM;  Surgeon: [name]. Georgette Dover, MD;  Location: [facility];  Service: General;  Laterality: N/A;  . COLONOSCOPY    . FEMORAL ARTERY STENT Left 06/28/2012   SFA  . LAPAROSCOPIC CHOLECYSTECTOMY  10/12/2013  . LEFT HEART CATHETERIZATION WITH CORONARY ANGIOGRAM N/A 06/28/2012   Procedure: LEFT HEART CATHETERIZATION WITH CORONARY ANGIOGRAM;  Surgeon: Laverda Page, MD;  Location: [facility] CATH LAB;  Service: Cardiovascular;  Laterality: N/A;  . LOWER EXTREMITY ANGIOGRAM N/A 06/28/2012   Procedure: LOWER EXTREMITY ANGIOGRAM;  Surgeon: Laverda Page, MD;  Location: [facility] CATH LAB;  Service: Cardiovascular;  Laterality: N/A;  . PERIPHERAL VASCULAR CATHETERIZATION N/A 09/10/2015   Procedure: Abdominal Aortogram w/Lower Extremity;  Surgeon: Adrian Prows, MD;  Location: [place] CV LAB;  Service: Cardiovascular;  Laterality: N/A;  . POSTERIOR LUMBAR FUSION    . REPLACEMENT TOTAL KNEE Left   . TONSILLECTOMY    . TUBAL LIGATION  1978    There were no vitals filed for this visit.  Subjective Assessment - 11/07/19 1324    Subjective Pt reports she has adjusted her HEP to an appropriate level and her bilat shoulder pain has improved to a 4/10. Pt states the scapular exs have helped to reduce her shoulder tension.    Currently in Pain? Yes    Pain Score 4     Pain Location Shoulder    Pain Orientation Right;Left    Pain Type Chronic pain    Pain Onset 1 to 4 weeks ago    Pain Frequency Intermittent                             [facility] Adult PT Treatment/Exercise - 11/08/19 0001      Exercises   Exercises Shoulder      Shoulder Exercises: Supine   Protraction Strengthening;Both;15 reps    Protraction Limitations c wand    Flexion AROM;Both;10 reps    Flexion  Limitations wand      Shoulder Exercises: Seated   Other Seated Exercises Scapular retraction 10x      Shoulder Exercises: Standing   Extension Strengthening;Both;15 reps;Theraband    Theraband Level (Shoulder Extension) Level 3 ([place])    Retraction Strengthening;Both;15 reps;Theraband    Theraband Level (Shoulder Retraction) Level 3 ([place])      Shoulder Exercises: Pulleys   Flexion 1 minute    Scaption 1 minute      Shoulder Exercises: ROM/Strengthening   Pendulum 10 reps for flex/ext; abd/add, cirlcles ; L ans R                  PT Education - 11/08/19 0533    Education Details HEP: scapular protraction and PPT exs added to HEP    Person(s) Educated Patient    Methods Explanation;Demonstration;Tactile cues;Verbal cues;Handout    Comprehension Need further instruction;Tactile cues required;Verbal cues required;Returned demonstration;Verbalized understanding            PT Short Term Goals - 10/22/19 2142      PT SHORT TERM GOAL #1   Title Pt will be Ind in an initial HEP    Baseline In progress    Time 3    Period Weeks    Status New    Target Date 11/12/19      PT SHORT TERM GOAL #2   Title Pt will voice understanding of measures to address and reduce shoulder pain    Baseline Decreased understanding    Time 3    Period Weeks    Status New    Target Date 11/12/19      PT SHORT TERM GOAL #3   Title Complete R shoulder eval and set goals    Time 3    Period Weeks    Status New    Target Date 11/12/19             PT Long Term Goals - 11/01/19 1829      PT LONG TERM GOAL #1   Title Pt will report an improved bilat shoulder pain range of 0-4 with daily activities    Baseline 3-10/10    Time 7    Period Weeks    Status New    Target Date 12/10/19      PT LONG TERM GOAL #2   Title Pt will be Ind in a final HEP    Baseline In progress    Time 7    Period Weeks    Status New  Target Date 12/10/19                 Plan -  11/08/19 0536    Clinical Impression Statement Scapular protraction ex was added for scapular stability and PPT was added to address posture and core strengthening. Pt is completing her HEP on an appropriate level and her bilat shoulder pain has decrease    Personal Factors and Comorbidities Comorbidity 3+;Past/Current Experience    Comorbidities CHF, COPD, fibromyalgia, PAD, DM2, anxiety    Examination-Activity Limitations Lift;Sleep;Reach Overhead    Stability/Clinical Decision Making Evolving/Moderate complexity    Clinical Decision Making Moderate    Rehab Potential Fair    PT Frequency 2x / week    PT Duration 6 weeks    PT Treatment/Interventions ADLs/Self Care Home Management;Cryotherapy;Electrical Stimulation;Moist Heat;Iontophoresis 4mg /ml Dexamethasone;Therapeutic activities;Therapeutic exercise;Manual techniques;Patient/family education;Dry needling;Joint Manipulations;Taping    PT Next Visit Plan Continue to address posture and scapular and core strengthening.    PT Home Exercise Plan [id number]. Scapular prottraction and PPT    Consulted and Agree with Plan of Care Patient           Patient will benefit from skilled therapeutic intervention in order to improve the following deficits and impairments:  Decreased mobility, Decreased activity tolerance, Decreased strength, Postural dysfunction, Pain, Obesity, Impaired UE functional use  Visit Diagnosis: Chronic pain of both shoulders  Rotator cuff arthropathy of right shoulder  Localized osteoarthritis of left shoulder     Problem List Patient Active Problem List   Diagnosis Date Noted  . COPD, group B, by GOLD 2017 classification ([place]) 07/06/2019  . Multiple lung nodules 07/06/2019  . Diabetes mellitus ([place]) 05/03/2019  . Type 2 diabetes mellitus with diabetic polyneuropathy, with long-term current use of insulin ([place]) 05/03/2019  . Screening for viral disease 03/22/2019  . Encounter for current long term use of  antiplatelet drug 03/22/2019  . Esophageal dysphagia 03/22/2019  . Gastroesophageal reflux disease 03/22/2019  . Hx of colonic polyps 03/22/2019  . Atrial tachycardia ([place])   . Left bundle branch block   . Chronic diastolic CHF (congestive heart failure) ([place])   . Uncontrolled type 2 diabetes mellitus with complication ([place])   . CKD (chronic kidney disease), stage III 02/02/2016  . Anemia, unspecified 04/08/2015  . Hemorrhoids 04/08/2015  . Osteoarthritis of right knee 04/05/2015  . HTN (hypertension) 04/05/2015  . Neuropathy in diabetes ([place]) 04/05/2015  . Hyperlipidemia 04/05/2015  . Pain and swelling of left lower leg 04/05/2015  . Carpal tunnel syndrome of left wrist 12/18/2014  . Pain in left knee 12/18/2014  . Vitamin D deficiency 11/29/2014  . Hyperlipidemia associated with type 2 diabetes mellitus ([place]) 08/31/2014  . Chronic cholecystitis with calculus 09/15/2013  . Disorder of synovium, tendon, and bursa 09/14/2013  . Feces contents abnormal 09/14/2013  . Former smoker 09/14/2013  . Morbid obesity with BMI of 40.0-44.9, adult ([place]) 09/06/2013  . 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 Constipation 11/08/2012  . Insulin dependent diabetes mellitus ([place]) 06/28/2012  . PAD (peripheral artery disease) ([place]) 06/28/2012  . Intermittent claudication ([place]) 06/28/2012  . Fibromyalgia 06/20/2012  . Shoulder pain 06/20/2012  . Lumbar postlaminectomy syndrome 06/20/2012  . Chronic pain 04/20/2012  . Migraine 03/31/2012    Gar Ponto MS, PT 11/08/19 5:47 AM  [place] [facility] [address] [place], [place], [address] Phone: [phone]   Fax:  [phone]  Name: Christina Solis MRN: [id number] Date of Birth:  01/12/1951   

## 2019-11-09 ENCOUNTER — Ambulatory Visit: Payer: Medicare Other

## 2019-11-09 ENCOUNTER — Other Ambulatory Visit: Payer: Self-pay

## 2019-11-09 DIAGNOSIS — M25511 Pain in right shoulder: Secondary | ICD-10-CM

## 2019-11-09 DIAGNOSIS — M19012 Primary osteoarthritis, left shoulder: Secondary | ICD-10-CM

## 2019-11-09 DIAGNOSIS — M12811 Other specific arthropathies, not elsewhere classified, right shoulder: Secondary | ICD-10-CM

## 2019-11-10 NOTE — Therapy (Signed)
[place], [place], [address] Phone: [phone]   Fax:  [phone]  Physical Therapy Treatment  Patient Details  Name: Christina Solis MRN: [id number] Date of Birth: 17-Jul-1950 Referring Provider (PT): Marchia Bond, MD   Encounter Date: 11/09/2019   PT End of Session - 11/09/19 1150    Visit Number 5    Number of Visits 13    Date for PT Re-Evaluation 12/10/19    Authorization Type [facility] MEDICARE/MEDICAID [place] ACCESS    PT Start Time 1135    PT Stop Time 1216    PT Time Calculation (min) 41 min    Activity Tolerance Patient tolerated treatment well    Behavior During Therapy [facility] for tasks assessed/performed           Past Medical History:  Diagnosis Date  . Anemia    sickle cell trait  . Anxiety   . Arthritis    "knees" (10/12/2013)  . CHF (congestive heart failure) ([place])   . Chronic back pain   . Chronic bronchitis ([place])    "got it q yr when I lived in [place]"  . Complication of anesthesia    hard to put under  . COPD (chronic obstructive pulmonary disease) ([place])   . Dysrhythmia   . Family history of anesthesia complication    sister hard to wake up  . Family history of anesthesia complication    niece have itching  . Fibromyalgia   . GERD (gastroesophageal reflux disease)   . Hyperlipidemia   . Hypertension   . Irregular heart beat   . Migraine    "used to get them alot; not regular anymore" (10/12/2013)  . PAD (peripheral artery disease) ([place])   . Pituitary tumor    followed at [facility] Endocrinology; on cabergoline 09/2013  . Pneumonia    "twice"  . Type II diabetes mellitus ([place])     Past Surgical History:  Procedure Laterality Date  . ABDOMINAL AORTAGRAM N/A 06/28/2012   Procedure: ABDOMINAL Maxcine Ham;  Surgeon: Laverda Page, MD;  Location: [facility] CATH LAB;  Service: Cardiovascular;  Laterality: N/A;  . APPENDECTOMY    . BACK SURGERY     x 2, lower back  . BILATERAL  CARPAL TUNNEL RELEASE    . CARDIAC CATHETERIZATION    . CESAREAN SECTION  1974; 1976; 1978  . CHOLECYSTECTOMY N/A 10/12/2013   Procedure: LAPAROSCOPIC CHOLECYSTECTOMY WITH INTRAOPERATIVE CHOLANGIOGRAM;  Surgeon: [name]. Georgette Dover, MD;  Location: [place];  Service: General;  Laterality: N/A;  . COLONOSCOPY    . FEMORAL ARTERY STENT Left 06/28/2012   SFA  . LAPAROSCOPIC CHOLECYSTECTOMY  10/12/2013  . LEFT HEART CATHETERIZATION WITH CORONARY ANGIOGRAM N/A 06/28/2012   Procedure: LEFT HEART CATHETERIZATION WITH CORONARY ANGIOGRAM;  Surgeon: Laverda Page, MD;  Location: [facility] CATH LAB;  Service: Cardiovascular;  Laterality: N/A;  . LOWER EXTREMITY ANGIOGRAM N/A 06/28/2012   Procedure: LOWER EXTREMITY ANGIOGRAM;  Surgeon: Laverda Page, MD;  Location: [facility] CATH LAB;  Service: Cardiovascular;  Laterality: N/A;  . PERIPHERAL VASCULAR CATHETERIZATION N/A 09/10/2015   Procedure: Abdominal Aortogram w/Lower Extremity;  Surgeon: Adrian Prows, MD;  Location: [place] CV LAB;  Service: Cardiovascular;  Laterality: N/A;  . POSTERIOR LUMBAR FUSION    . REPLACEMENT TOTAL KNEE Left   . TONSILLECTOMY    . TUBAL LIGATION  1978    There were no vitals filed for this visit.   Subjective Assessment - 11/09/19 1142    Subjective  Pt reports she is doing well. She reports her bilat shoulder pain is improving rating a 3/10.    Currently in Pain? Yes    Pain Score 3     Pain Location Shoulder    Pain Orientation Right;Left    Pain Descriptors / Indicators Aching    Pain Type Chronic pain    Pain Onset 1 to 4 weeks ago    Pain Frequency Intermittent    Aggravating Factors  [place] at the end of the day, rainy weather, carrying pocketbook    Pain Relieving Factors Exs, rest, meds                             [facility] Adult PT Treatment/Exercise - 11/10/19 0001      Exercises   Exercises Shoulder      Shoulder Exercises: Supine   Flexion AROM;Both;10 reps    Flexion Limitations wand       Shoulder Exercises: Seated   Other Seated Exercises Scapular retraction 10x      Shoulder Exercises: Standing   Protraction Strengthening;Both;15 reps    Protraction Limitations Table push up    Extension Strengthening;Both;15 reps;Theraband    Theraband Level (Shoulder Extension) Level 3 (Green)    Retraction Strengthening;Both;15 reps;Theraband    Theraband Level (Shoulder Retraction) Level 3 (Green)      Shoulder Exercises: ROM/Strengthening   UBE (Upper Arm Bike) 5 mins; forward and backwards      Manual Therapy   Manual Therapy Soft tissue mobilization    Soft tissue mobilization Cross friction massage anterior shoulder both 5 mis each. L is more tender than R                  PT Education - 11/10/19 6256    Education Details HEP for standing sh. protraction. cross friction massage to the anterior shoulder bilat    Person(s) Educated Patient    Methods Explanation;Demonstration;Handout;Verbal cues;Tactile cues    Comprehension Verbalized understanding;Returned demonstration;Verbal cues required;Tactile cues required            PT Short Term Goals - 10/22/19 2142      PT SHORT TERM GOAL #1   Title Pt will be Ind in an initial HEP    Baseline In progress    Time 3    Period Weeks    Status New    Target Date 11/12/19      PT SHORT TERM GOAL #2   Title Pt will voice understanding of measures to address and reduce shoulder pain    Baseline Decreased understanding    Time 3    Period Weeks    Status New    Target Date 11/12/19      PT SHORT TERM GOAL #3   Title Complete R shoulder eval and set goals    Time 3    Period Weeks    Status New    Target Date 11/12/19             PT Long Term Goals - 11/01/19 1829      PT LONG TERM GOAL #1   Title Pt will report an improved bilat shoulder pain range of 0-4 with daily activities    Baseline 3-10/10    Time 7    Period Weeks    Status New    Target Date 12/10/19      PT LONG TERM GOAL #2   Title  Pt will be  Ind in a final HEP    Baseline In progress    Time 7    Period Weeks    Status New    Target Date 12/10/19                 Plan - 11/10/19 2297    Clinical Impression Statement PT addressed shoulder strength and ROM, posture, and cross friction massage was completed to the anterior aspects of B shoulders. On the eval pt was significantly tender throughout the [facility] area of both shouders. This tenderness is now more located anteriorly, L sh greater than R.    Personal Factors and Comorbidities Comorbidity 3+;Past/Current Experience    Comorbidities CHF, COPD, fibromyalgia, PAD, DM2, anxiety    Examination-Activity Limitations Lift;Sleep;Reach Overhead    Stability/Clinical Decision Making Evolving/Moderate complexity    Clinical Decision Making Moderate    Rehab Potential Fair    PT Frequency 2x / week    PT Duration 6 weeks    PT Treatment/Interventions ADLs/Self Care Home Management;Cryotherapy;Electrical Stimulation;Moist Heat;Iontophoresis 4mg /ml Dexamethasone;Therapeutic activities;Therapeutic exercise;Manual techniques;Patient/family education;Dry needling;Joint Manipulations;Taping    PT Next Visit Plan Continue to address posture, and shoulder/scapula and core strengthening. Assess repsonse to croos friction massage.    PT Home Exercise Plan [id number]. Scapular protraction standing push up c table    Consulted and Agree with Plan of Care Patient           Patient will benefit from skilled therapeutic intervention in order to improve the following deficits and impairments:  Decreased mobility, Decreased activity tolerance, Decreased strength, Postural dysfunction, Pain, Obesity, Impaired UE functional use  Visit Diagnosis: Chronic pain of both shoulders  Rotator cuff arthropathy of right shoulder  Localized osteoarthritis of left shoulder     Problem List Patient Active Problem List   Diagnosis Date Noted  . COPD, group B, by GOLD 2017 classification  ([place]) 07/06/2019  . Multiple lung nodules 07/06/2019  . Diabetes mellitus ([place]) 05/03/2019  . Type 2 diabetes mellitus with diabetic polyneuropathy, with long-term current use of insulin ([place]) 05/03/2019  . Screening for viral disease 03/22/2019  . Encounter for current long term use of antiplatelet drug 03/22/2019  . Esophageal dysphagia 03/22/2019  . Gastroesophageal reflux disease 03/22/2019  . Hx of colonic polyps 03/22/2019  . Atrial tachycardia ([place])   . Left bundle branch block   . Chronic diastolic CHF (congestive heart failure) ([place])   . Uncontrolled type 2 diabetes mellitus with complication ([place])   . CKD (chronic kidney disease), stage III 02/02/2016  . Anemia, unspecified 04/08/2015  . Hemorrhoids 04/08/2015  . Osteoarthritis of right knee 04/05/2015  . HTN (hypertension) 04/05/2015  . Neuropathy in diabetes ([place]) 04/05/2015  . Hyperlipidemia 04/05/2015  . Pain and swelling of left lower leg 04/05/2015  . Carpal tunnel syndrome of left wrist 12/18/2014  . Pain in left knee 12/18/2014  . Vitamin D deficiency 11/29/2014  . Hyperlipidemia associated with type 2 diabetes mellitus ([place]) 08/31/2014  . Chronic cholecystitis with calculus 09/15/2013  . Disorder of synovium, tendon, and bursa 09/14/2013  . Feces contents abnormal 09/14/2013  . Former smoker 09/14/2013  . Morbid obesity with BMI of 40.0-44.9, adult ([place]) 09/06/2013  . 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 Constipation 11/08/2012  . Insulin dependent diabetes mellitus ([place]) 06/28/2012  . PAD (peripheral artery disease) ([place]) 06/28/2012  . Intermittent claudication ([place]) 06/28/2012  . Fibromyalgia 06/20/2012  . Shoulder pain 06/20/2012  . Lumbar postlaminectomy syndrome 06/20/2012  .  Chronic pain 04/20/2012  . Migraine 03/31/2012    Gar Ponto MS, PT 11/10/19 6:30 AM   [facility] [address] [place], [place], [address] Phone: [phone]   Fax:  [phone]  Name: Christina Solis MRN: [id number] Date of Birth: [birth year]

## 2019-11-14 ENCOUNTER — Other Ambulatory Visit: Payer: Self-pay

## 2019-11-14 ENCOUNTER — Ambulatory Visit: Payer: Medicare Other

## 2019-11-14 DIAGNOSIS — M19012 Primary osteoarthritis, left shoulder: Secondary | ICD-10-CM

## 2019-11-14 DIAGNOSIS — M25512 Pain in left shoulder: Secondary | ICD-10-CM

## 2019-11-14 DIAGNOSIS — M12811 Other specific arthropathies, not elsewhere classified, right shoulder: Secondary | ICD-10-CM

## 2019-11-14 DIAGNOSIS — M25511 Pain in right shoulder: Secondary | ICD-10-CM | POA: Diagnosis not present

## 2019-11-14 NOTE — Therapy (Signed)
[place], [place], [address] Phone: [phone]   Fax:  [phone]  Physical Therapy Treatment  Patient Details  Name: Christina Solis MRN: [id number] Date of Birth: 1950-11-30 Referring Provider (PT): Marchia Bond, MD   Encounter Date: 11/14/2019   PT End of Session - 11/14/19 1421    Visit Number 6    Number of Visits 13    Date for PT Re-Evaluation 12/10/19    Authorization Type [employer] MEDICARE/MEDICAID  ACCESS    Progress Note Due on Visit 10    PT Start Time 1315    PT Stop Time 1400    PT Time Calculation (min) 45 min    Activity Tolerance Patient tolerated treatment well    Behavior During Therapy [facility] for tasks assessed/performed           Past Medical History:  Diagnosis Date  . Anemia    sickle cell trait  . Anxiety   . Arthritis    "knees" (10/12/2013)  . CHF (congestive heart failure) ([place])   . Chronic back pain   . Chronic bronchitis ([place])    "got it q yr when I lived in [place]"  . Complication of anesthesia    hard to put under  . COPD (chronic obstructive pulmonary disease) ([place])   . Dysrhythmia   . Family history of anesthesia complication    sister hard to wake up  . Family history of anesthesia complication    niece have itching  . Fibromyalgia   . GERD (gastroesophageal reflux disease)   . Hyperlipidemia   . Hypertension   . Irregular heart beat   . Migraine    "used to get them alot; not regular anymore" (10/12/2013)  . PAD (peripheral artery disease) ([place])   . Pituitary tumor    followed at [facility] Endocrinology; on cabergoline 09/2013  . Pneumonia    "twice"  . Type II diabetes mellitus ([place])     Past Surgical History:  Procedure Laterality Date  . ABDOMINAL AORTAGRAM N/A 06/28/2012   Procedure: ABDOMINAL Maxcine Ham;  Surgeon: Laverda Page, MD;  Location[facility] CATH LAB;  Service: Cardiovascular;  Laterality: N/A;  . APPENDECTOMY    . BACK SURGERY      x 2, lower back  . BILATERAL CARPAL TUNNEL RELEASE    . CARDIAC CATHETERIZATION    . CESAREAN SECTION  1974; 1976; 1978  . CHOLECYSTECTOMY N/A 10/12/2013   Procedure: LAPAROSCOPIC CHOLECYSTECTOMY WITH INTRAOPERATIVE CHOLANGIOGRAM;  Surgeon: [name]. Georgette Dover, MD;  Location: [place];  Service: General;  Laterality: N/A;  . COLONOSCOPY    . FEMORAL ARTERY STENT Left 06/28/2012   SFA  . LAPAROSCOPIC CHOLECYSTECTOMY  10/12/2013  . LEFT HEART CATHETERIZATION WITH CORONARY ANGIOGRAM N/A 06/28/2012   Procedure: LEFT HEART CATHETERIZATION WITH CORONARY ANGIOGRAM;  Surgeon: Laverda Page, MD;  Location: [facility] CATH LAB;  Service: Cardiovascular;  Laterality: N/A;  . LOWER EXTREMITY ANGIOGRAM N/A 06/28/2012   Procedure: LOWER EXTREMITY ANGIOGRAM;  Surgeon: Laverda Page, MD;  Location: [facility] CATH LAB;  Service: Cardiovascular;  Laterality: N/A;  . PERIPHERAL VASCULAR CATHETERIZATION N/A 09/10/2015   Procedure: Abdominal Aortogram w/Lower Extremity;  Surgeon: Adrian Prows, MD;  Location: [place] CV LAB;  Service: Cardiovascular;  Laterality: N/A;  . POSTERIOR LUMBAR FUSION    . REPLACEMENT TOTAL KNEE Left   . TONSILLECTOMY    . TUBAL LIGATION  1978    There were no vitals filed for this visit.  Subjective Assessment - 11/14/19 1323    Subjective Pt reports she is continuing to do better. Rates shoulder pain as a 3/10. Pt states the cross fiction massage has been helpful c pain management.    Currently in Pain? Yes    Pain Score 3     Pain Location Shoulder    Pain Orientation Left;Right;Anterior    Pain Descriptors / Indicators Aching    Pain Type Chronic pain    Pain Onset 1 to 4 weeks ago    Pain Frequency Intermittent                             [facility] Adult PT Treatment/Exercise - 11/14/19 0001      Self-Care   Self-Care Posture;Other Self-Care Comments    Posture Review of proper sitting , standing and sleeping postures    Other Self-Care Comments  Proper completion  of ther ex/HEP and cross fricton massage      Exercises   Exercises Shoulder;Lumbar      Lumbar Exercises: Stretches   Hip Flexor Stretch Right;Left;2 reps;20 seconds      Lumbar Exercises: Supine   Pelvic Tilt 15 reps    Pelvic Tilt Limitations 3 sec    Bent Knee Raise 10 reps;1 second    Bent Knee Raise Limitations 2 sets      Shoulder Exercises: Standing   Protraction Strengthening;Both;10 reps    Protraction Limitations 2 sets; Table push up    Flexion Strengthening;Weights    Shoulder Flexion Weight (lbs) 2 lbs    Flexion Limitations 2 sets; 90d;     ABduction Strengthening;10 reps;Weights    Shoulder ABduction Weight (lbs) 2 lbs    ABduction Limitations 2 sets; 90d      Manual Therapy   Manual Therapy Soft tissue mobilization    Soft tissue mobilization Cross friction massage anterior shoulder both 5 mis each. L is more tender than R. Also provided  massage to the posterior shoulder for lat. dorsi and RC.                  PT Education - 11/14/19 1419    Education Details HEP to address forward flexed trunk. PPT c marching and hip flexor stretch in standing    Person(s) Educated Patient    Methods Explanation;Demonstration;Tactile cues;Verbal cues;Handout    Comprehension Verbalized understanding;Returned demonstration;Verbal cues required;Tactile cues required;Need further instruction            PT Short Term Goals - 11/14/19 1426      PT SHORT TERM GOAL #1   Title Pt will be Ind in an initial HEP. Met    Status Achieved      PT SHORT TERM GOAL #2   Title Pt will voice understanding of measures to address and reduce shoulder pain. Met-Pt is using  HEP, cross friction massage, positional support in sitting and sleeping to assist in pain management    Status Achieved      PT SHORT TERM GOAL #3   Title Complete R shoulder eval and set goals. Completed and Met    Status Achieved             PT Long Term Goals - 11/01/19 1829      PT LONG TERM  GOAL #1   Title Pt will report an improved bilat shoulder pain range of 0-4 with daily activities    Baseline 3-10/10    Time 7  Period Weeks    Status New    Target Date 12/10/19      PT LONG TERM GOAL #2   Title Pt will be Ind in a final HEP    Baseline In progress    Time 7    Period Weeks    Status New    Target Date 12/10/19                 Plan - 11/14/19 1422    Clinical Impression Statement PT continued for strengthening of both shoulders and core to addres pain, posture and function. Abdominla strengthening progressed and hip flexor stretch added. pt tolerated program well and shoulder pain is improving    Personal Factors and Comorbidities Comorbidity 3+;Past/Current Experience    Comorbidities CHF, COPD, fibromyalgia, PAD, DM2, anxiety    Examination-Activity Limitations Lift;Sleep;Reach Overhead    Stability/Clinical Decision Making Evolving/Moderate complexity    Clinical Decision Making Moderate    Rehab Potential Fair    PT Frequency 2x / week    PT Duration 6 weeks    PT Treatment/Interventions ADLs/Self Care Home Management;Cryotherapy;Electrical Stimulation;Moist Heat;Iontophoresis 34m/ml Dexamethasone;Therapeutic activities;Therapeutic exercise;Manual techniques;Patient/family education;Dry needling;Joint Manipulations;Taping    PT Next Visit Plan Continue to address posture, and shoulder/scapula and core strengthening. Consider iontophoresis for shoulder pain.    PT Home Exercise Plan [id number] HEP to address forward flexed trunk. PPT c marching and hip flexor stretch in standing    Consulted and Agree with Plan of Care Patient           Patient will benefit from skilled therapeutic intervention in order to improve the following deficits and impairments:  Decreased mobility, Decreased activity tolerance, Decreased strength, Postural dysfunction, Pain, Obesity, Impaired UE functional use  Visit Diagnosis: Chronic pain of both shoulders  Rotator  cuff arthropathy of right shoulder  Localized osteoarthritis of left shoulder     Problem List Patient Active Problem List   Diagnosis Date Noted  . COPD, group B, by GOLD 2017 classification ([place] 07/06/2019  . Multiple lung nodules 07/06/2019  . Diabetes mellitus ([place] 05/03/2019  . Type 2 diabetes mellitus with diabetic polyneuropathy, with long-term current use of insulin ([place] 05/03/2019  . Screening for viral disease 03/22/2019  . Encounter for current long term use of antiplatelet drug 03/22/2019  . Esophageal dysphagia 03/22/2019  . Gastroesophageal reflux disease 03/22/2019  . Hx of colonic polyps 03/22/2019  . Atrial tachycardia ([place]   . Left bundle branch block   . Chronic diastolic CHF (congestive heart failure) ([place]   . Uncontrolled type 2 diabetes mellitus with complication ([place]   . CKD (chronic kidney disease), stage III 02/02/2016  . Anemia, unspecified 04/08/2015  . Hemorrhoids 04/08/2015  . Osteoarthritis of right knee 04/05/2015  . HTN (hypertension) 04/05/2015  . Neuropathy in diabetes ([place] 04/05/2015  . Hyperlipidemia 04/05/2015  . Pain and swelling of left lower leg 04/05/2015  . Carpal tunnel syndrome of left wrist 12/18/2014  . Pain in left knee 12/18/2014  . Vitamin D deficiency 11/29/2014  . Hyperlipidemia associated with type 2 diabetes mellitus ([place] 08/31/2014  . Chronic cholecystitis with calculus 09/15/2013  . Disorder of synovium, tendon, and bursa 09/14/2013  . Feces contents abnormal 09/14/2013  . Former smoker 09/14/2013  . Morbid obesity with BMI of 40.0-44.9, adult ([place] 09/06/2013  . 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 Constipation 11/08/2012  . Insulin dependent diabetes mellitus ([place] 06/28/2012  . PAD (peripheral artery disease) ([place] 06/28/2012  .  Intermittent claudication ([place]) 06/28/2012  . Fibromyalgia 06/20/2012  . Shoulder pain 06/20/2012  . Lumbar postlaminectomy syndrome  06/20/2012  . Chronic pain 04/20/2012  . Migraine 03/31/2012    Gar Ponto 11/14/2019, 2:32 PM  [facility] [address] [place], [place], [address] Phone: [phone]   Fax:  [phone]  Name: IXEL BOEHNING MRN: [id number] Date of Birth: April 06, 1951

## 2019-11-14 NOTE — Patient Instructions (Addendum)
° ° °  Complete in standing

## 2019-11-16 ENCOUNTER — Other Ambulatory Visit: Payer: Self-pay

## 2019-11-16 ENCOUNTER — Ambulatory Visit: Payer: Medicare Other | Attending: Nurse Practitioner

## 2019-11-16 DIAGNOSIS — G8929 Other chronic pain: Secondary | ICD-10-CM | POA: Diagnosis present

## 2019-11-16 DIAGNOSIS — M19012 Primary osteoarthritis, left shoulder: Secondary | ICD-10-CM | POA: Diagnosis present

## 2019-11-16 DIAGNOSIS — M25512 Pain in left shoulder: Secondary | ICD-10-CM | POA: Insufficient documentation

## 2019-11-16 DIAGNOSIS — M12811 Other specific arthropathies, not elsewhere classified, right shoulder: Secondary | ICD-10-CM | POA: Diagnosis present

## 2019-11-16 DIAGNOSIS — M25511 Pain in right shoulder: Secondary | ICD-10-CM | POA: Diagnosis present

## 2019-11-16 NOTE — Therapy (Signed)
[place], [place], [address] Phone: [phone]   Fax:  [phone]  Physical Therapy Treatment  Patient Details  Name: Christina Solis MRN: [id number] Date of Birth: 12/05/1950 Referring Provider (PT): Marchia Bond, MD   Encounter Date: 11/16/2019   PT End of Session - 11/16/19 1413    Visit Number 7    Number of Visits 13    Date for PT Re-Evaluation 12/10/19    Authorization Type [facility] MEDICARE/MEDICAID [place] ACCESS    PT Start Time 1133    PT Stop Time 1218    PT Time Calculation (min) 45 min    Activity Tolerance Patient tolerated treatment well    Behavior During Therapy [facility] for tasks assessed/performed           Past Medical History:  Diagnosis Date   Anemia    sickle cell trait   Anxiety    Arthritis    "knees" (10/12/2013)   CHF (congestive heart failure) ([place])    Chronic back pain    Chronic bronchitis ([place])    "got it q yr when I lived in [place]"   Complication of anesthesia    hard to put under   COPD (chronic obstructive pulmonary disease) ([place])    Dysrhythmia    Family history of anesthesia complication    sister hard to wake up   Family history of anesthesia complication    niece have itching   Fibromyalgia    GERD (gastroesophageal reflux disease)    Hyperlipidemia    Hypertension    Irregular heart beat    Migraine    "used to get them alot; not regular anymore" (10/12/2013)   PAD (peripheral artery disease) ([place])    Pituitary tumor    followed at [facility] Endocrinology; on cabergoline 09/2013   Pneumonia    "twice"   Type II diabetes mellitus ([place])     Past Surgical History:  Procedure Laterality Date   ABDOMINAL AORTAGRAM N/A 06/28/2012   Procedure: ABDOMINAL Maxcine Ham;  Surgeon: Laverda Page, MD;  Location: [facility] CATH LAB;  Service: Cardiovascular;  Laterality: N/A;   APPENDECTOMY     BACK SURGERY     x 2, lower back   BILATERAL  CARPAL TUNNEL RELEASE     CARDIAC CATHETERIZATION     CESAREAN SECTION  1974; 1976; [place] N/A 10/12/2013   Procedure: LAPAROSCOPIC CHOLECYSTECTOMY WITH INTRAOPERATIVE CHOLANGIOGRAM;  Surgeon: [name]. Georgette Dover, MD;  Location: [place];  Service: General;  Laterality: N/A;   COLONOSCOPY     FEMORAL ARTERY STENT Left 06/28/2012   SFA   LAPAROSCOPIC CHOLECYSTECTOMY  10/12/2013   LEFT HEART CATHETERIZATION WITH CORONARY ANGIOGRAM N/A 06/28/2012   Procedure: LEFT HEART CATHETERIZATION WITH CORONARY ANGIOGRAM;  Surgeon: Laverda Page, MD;  Location: [facility] CATH LAB;  Service: Cardiovascular;  Laterality: N/A;   LOWER EXTREMITY ANGIOGRAM N/A 06/28/2012   Procedure: LOWER EXTREMITY ANGIOGRAM;  Surgeon: Laverda Page, MD;  Location: [facility] CATH LAB;  Service: Cardiovascular;  Laterality: N/A;   PERIPHERAL VASCULAR CATHETERIZATION N/A 09/10/2015   Procedure: Abdominal Aortogram w/Lower Extremity;  Surgeon: Adrian Prows, MD;  Location: [place] CV LAB;  Service: Cardiovascular;  Laterality: N/A;   POSTERIOR LUMBAR FUSION     REPLACEMENT TOTAL KNEE Left    TONSILLECTOMY     TUBAL LIGATION  1978    There were no vitals filed for this visit.   Subjective Assessment - 11/16/19 1143    Subjective  Pt reports her Bilat Sh pain has decreased further.    Currently in Pain? Yes    Pain Score 2     Pain Location Shoulder    Pain Orientation Right;Left;Anterior    Pain Descriptors / Indicators Aching    Pain Type Chronic pain    Pain Onset 1 to 4 weeks ago    Pain Frequency Intermittent    Aggravating Factors  Forward reaching- word searches, washing dishes; end of day    Pain Relieving Factors Massage, exs, voleran gel, meds    Effect of Pain on Daily Activities Min to mod impact                             [facility] Adult PT Treatment/Exercise - 11/16/19 0001      Self-Care   Self-Care Other Self-Care Comments    Other Self-Care Comments  Exs added to HEP c  instruction and verbal cueing of new and current exs      Exercises   Exercises Shoulder;Lumbar      Lumbar Exercises: Stretches   Hip Flexor Stretch Right;Left;2 reps;20 seconds      Lumbar Exercises: Supine   Pelvic Tilt 15 reps    Pelvic Tilt Limitations 3 sec    Bent Knee Raise 10 reps    Bent Knee Raise Limitations 2 sets      Shoulder Exercises: Standing   Protraction Strengthening;Both;10 reps    Protraction Limitations 2 sets; Table push up    Flexion --    Shoulder Flexion Weight (lbs) --    Flexion Limitations --    ABduction --    Shoulder ABduction Weight (lbs) --    ABduction Limitations --    Extension Strengthening;Both;15 reps;Theraband    Theraband Level (Shoulder Extension) Level 3 (Green)    Extension Limitations  2 sets    Retraction Strengthening;Both;15 reps;Theraband    Theraband Level (Shoulder Retraction) Level 3 (Green)    Retraction Limitations 2 sets      Shoulder Exercises: ROM/Strengthening   UBE (Upper Arm Bike) 5 mins; forward and backwards                  PT Education - 11/16/19 1423    Education Details Exs added to HEP (post. scaular stregthening) c instruction and verbal cueing of new and current exs            PT Short Term Goals - 11/14/19 1426      PT SHORT TERM GOAL #1   Title Pt will be Ind in an initial HEP. Met    Status Achieved      PT SHORT TERM GOAL #2   Title Pt will voice understanding of measures to address and reduce shoulder pain. Met-Pt is using  HEP, cross friction massage, positional support in sitting and sleeping to assist in pain management    Status Achieved      PT SHORT TERM GOAL #3   Title Complete R shoulder eval and set goals. Completed and Met    Status Achieved             PT Long Term Goals - 11/01/19 1829      PT LONG TERM GOAL #1   Title Pt will report an improved bilat shoulder pain range of 0-4 with daily activities    Baseline 3-10/10    Time 7    Period Weeks    Status  New  Target Date 12/10/19      PT LONG TERM GOAL #2   Title Pt will be Ind in a final HEP    Baseline In progress    Time 7    Period Weeks    Status New    Target Date 12/10/19                 Plan - 11/16/19 1415    Clinical Impression Statement Pt continues to repond well to PT program addressing Bilat sh ROM, strengthening, and posture. Pt states she has been completing the cross friction massage to the ant. aspects of both shoulders. Progression of posterior scapular strengthening exs were added to ther ex and HEP.    Personal Factors and Comorbidities Comorbidity 3+;Past/Current Experience    Comorbidities CHF, COPD, fibromyalgia, PAD, DM2, anxiety    Examination-Activity Limitations Lift;Sleep;Reach Overhead    Stability/Clinical Decision Making Evolving/Moderate complexity    Clinical Decision Making Moderate    Rehab Potential Fair    PT Frequency 2x / week    PT Duration 6 weeks    PT Treatment/Interventions ADLs/Self Care Home Management;Cryotherapy;Electrical Stimulation;Moist Heat;Iontophoresis 59m/ml Dexamethasone;Therapeutic activities;Therapeutic exercise;Manual techniques;Patient/family education;Dry needling;Joint Manipulations;Taping    PT Next Visit Plan Continue to address posture, and shoulder/scapula and core strengthening. Consider iontophoresis for shoulder pain.    PT Home Exercise Plan [id number] HEP to address forward flexed trunk. PPT c marching and hip flexor stretch in standing    Consulted and Agree with Plan of Care Patient           Patient will benefit from skilled therapeutic intervention in order to improve the following deficits and impairments:  Decreased mobility, Decreased activity tolerance, Decreased strength, Postural dysfunction, Pain, Obesity, Impaired UE functional use  Visit Diagnosis: Chronic pain of both shoulders  Rotator cuff arthropathy of right shoulder  Localized osteoarthritis of left shoulder     Problem  List Patient Active Problem List   Diagnosis Date Noted   COPD, group B, by GOLD 2017 classification ([place] 07/06/2019   Multiple lung nodules 07/06/2019   Diabetes mellitus (HB[place] 05/03/2019   Type 2 diabetes mellitus with diabetic polyneuropathy, with long-term current use of insulin (HE[place] 05/03/2019   Screening for viral disease 03/22/2019   Encounter for current long term use of antiplatelet drug 03/22/2019   Esophageal dysphagia 03/22/2019   Gastroesophageal reflux disease 03/22/2019   Hx of colonic polyps 03/22/2019   Atrial tachycardia ([place]    Left bundle branch block    Chronic diastolic CHF (congestive heart failure) ([place]    Uncontrolled type 2 diabetes mellitus with complication (HL[place]    CKD (chronic kidney disease), stage III 02/02/2016   Anemia, unspecified 04/08/2015   Hemorrhoids 04/08/2015   Osteoarthritis of right knee 04/05/2015   HTN (hypertension) 04/05/2015   Neuropathy in diabetes ([place] 04/05/2015   Hyperlipidemia 04/05/2015   Pain and swelling of left lower leg 04/05/2015   Carpal tunnel syndrome of left wrist 12/18/2014   Pain in left knee 12/18/2014   Vitamin D deficiency 11/29/2014   Hyperlipidemia associated with type 2 diabetes mellitus (H[place] Pas[place] 08/31/2014   Chronic cholecystitis with calculus 09/15/2013   Disorder of synovium, tendon, and bursa 09/14/2013   Feces contents abnormal 09/14/2013   Former smoker 09/14/2013   Morbid obesity with BMI of 40.0-44.9, adult (HTsaile 09/06/2013   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 [address] [place], [place], [address] Phone: [phone]   Fax:  [phone]  Name: Christina Solis MRN: [id number] Date of Birth: 06-18-1950

## 2019-11-21 ENCOUNTER — Other Ambulatory Visit: Payer: Self-pay

## 2019-11-21 ENCOUNTER — Ambulatory Visit: Payer: Medicare Other

## 2019-11-21 DIAGNOSIS — M25511 Pain in right shoulder: Secondary | ICD-10-CM | POA: Diagnosis not present

## 2019-11-21 DIAGNOSIS — M12811 Other specific arthropathies, not elsewhere classified, right shoulder: Secondary | ICD-10-CM

## 2019-11-21 DIAGNOSIS — M25512 Pain in left shoulder: Secondary | ICD-10-CM

## 2019-11-21 DIAGNOSIS — M19012 Primary osteoarthritis, left shoulder: Secondary | ICD-10-CM

## 2019-11-21 NOTE — Therapy (Addendum)
[place], [place], [address] Phone: [phone]   Fax:  [phone]  Physical Therapy Treatment/DC Summary  Patient Details  Name: Christina Solis MRN: [id number] Date of Birth: Sep 30, 1950 Referring Provider (PT): Marchia Bond, MD   Encounter Date: 11/21/2019   PT End of Session - 11/21/19 1322    Visit Number 8    Number of Visits 13    Date for PT Re-Evaluation 12/10/19    Authorization Type [employer] MEDICARE/MEDICAID [place] ACCESS    Progress Note Due on Visit 10    PT Start Time 1319    PT Stop Time 1400    PT Time Calculation (min) 41 min    Activity Tolerance Patient tolerated treatment well    Behavior During Therapy [facility] for tasks assessed/performed           Past Medical History:  Diagnosis Date  . Anemia    sickle cell trait  . Anxiety   . Arthritis    "knees" (10/12/2013)  . CHF (congestive heart failure) ([place])   . Chronic back pain   . Chronic bronchitis ([place])    "got it q yr when I lived in [place]"  . Complication of anesthesia    hard to put under  . COPD (chronic obstructive pulmonary disease) ([place])   . Dysrhythmia   . Family history of anesthesia complication    sister hard to wake up  . Family history of anesthesia complication    niece have itching  . Fibromyalgia   . GERD (gastroesophageal reflux disease)   . Hyperlipidemia   . Hypertension   . Irregular heart beat   . Migraine    "used to get them alot; not regular anymore" (10/12/2013)  . PAD (peripheral artery disease) ([place])   . Pituitary tumor    followed at [facility] Endocrinology; on cabergoline 09/2013  . Pneumonia    "twice"  . Type II diabetes mellitus ([place])     Past Surgical History:  Procedure Laterality Date  . ABDOMINAL AORTAGRAM N/A 06/28/2012   Procedure: ABDOMINAL Maxcine Ham;  Surgeon: Laverda Page, MD;  Location: [facility] CATH LAB;  Service: Cardiovascular;  Laterality: N/A;  . APPENDECTOMY    . BACK  SURGERY     x 2, lower back  . BILATERAL CARPAL TUNNEL RELEASE    . CARDIAC CATHETERIZATION    . CESAREAN SECTION  1974; 1976; 1978  . CHOLECYSTECTOMY N/A 10/12/2013   Procedure: LAPAROSCOPIC CHOLECYSTECTOMY WITH INTRAOPERATIVE CHOLANGIOGRAM;  Surgeon: [name]. Georgette Dover, MD;  Location: [place];  Service: General;  Laterality: N/A;  . COLONOSCOPY    . FEMORAL ARTERY STENT Left 06/28/2012   SFA  . LAPAROSCOPIC CHOLECYSTECTOMY  10/12/2013  . LEFT HEART CATHETERIZATION WITH CORONARY ANGIOGRAM N/A 06/28/2012   Procedure: LEFT HEART CATHETERIZATION WITH CORONARY ANGIOGRAM;  Surgeon: Laverda Page, MD;  Location: [facility] CATH LAB;  Service: Cardiovascular;  Laterality: N/A;  . LOWER EXTREMITY ANGIOGRAM N/A 06/28/2012   Procedure: LOWER EXTREMITY ANGIOGRAM;  Surgeon: Laverda Page, MD;  Location: [facility] CATH LAB;  Service: Cardiovascular;  Laterality: N/A;  . PERIPHERAL VASCULAR CATHETERIZATION N/A 09/10/2015   Procedure: Abdominal Aortogram w/Lower Extremity;  Surgeon: Adrian Prows, MD;  Location: [place] CV LAB;  Service: Cardiovascular;  Laterality: N/A;  . POSTERIOR LUMBAR FUSION    . REPLACEMENT TOTAL KNEE Left   . TONSILLECTOMY    . TUBAL LIGATION  1978    There were no vitals filed for this visit.  Subjective Assessment - 11/21/19 1330    Subjective Pt. reports having a busy weekend with family in town for the holiday, and she did well. pt denies bilat shoulder pain.    Currently in Pain? No/denies    Pain Score 0-No pain    Pain Location Shoulder    Pain Orientation Right;Left;Anterior    Pain Descriptors / Indicators Aching    Pain Type Chronic pain    Pain Onset More than a month ago    Aggravating Factors  very few now    Pain Relieving Factors massage, ex, volteran    Effect of Pain on Daily Activities Improving                             [facility] Adult PT Treatment/Exercise - 11/21/19 0001      Self-Care   Self-Care Other Self-Care Comments    Other Self-Care  Comments  Verbal cueing for most proper  technique for ther ex. and for new HEP ex of bridging      Exercises   Exercises Shoulder;Lumbar      Lumbar Exercises: Stretches   Hip Flexor Stretch Right;Left;2 reps;20 seconds      Lumbar Exercises: Supine   Pelvic Tilt 15 reps    Pelvic Tilt Limitations 3 sec    Bent Knee Raise 10 reps    Bent Knee Raise Limitations 2 sets    Bridge 10 reps;3 seconds    Bridge Limitations  3 sets      Shoulder Exercises: Standing   Protraction Strengthening;Both;10 reps    Protraction Limitations 2 sets; Table push up    Flexion Strengthening;Weights    Shoulder Flexion Weight (lbs) 1 lb    Flexion Limitations 2 sets; full ROM    ABduction Strengthening;10 reps;Weights    Shoulder ABduction Weight (lbs) 1 bs    ABduction Limitations 1 sets; full     Extension Strengthening;Both;15 reps;Theraband    Theraband Level (Shoulder Extension) Level 3 (Green)    Extension Limitations  2 sets    Retraction Strengthening;Both;15 reps;Theraband    Theraband Level (Shoulder Retraction) Level 3 (Green)    Retraction Limitations 2 sets      Shoulder Exercises: ROM/Strengthening   UBE (Upper Arm Bike) 8 mins; forward and backwards                  PT Education - 11/21/19 1349    Education Details Verbal cueing for most proper  technique for ther ex. and for new HEP ex of bridging    Methods Explanation    Comprehension Verbalized understanding;Returned demonstration            PT Short Term Goals - 11/14/19 1426      PT SHORT TERM GOAL #1   Title Pt will be Ind in an initial HEP. Met    Status Achieved      PT SHORT TERM GOAL #2   Title Pt will voice understanding of measures to address and reduce shoulder pain. Met-Pt is using  HEP, cross friction massage, positional support in sitting and sleeping to assist in pain management    Status Achieved      PT SHORT TERM GOAL #3   Title Complete R shoulder eval and set goals. Completed and Met     Status Achieved             PT Long Term Goals - 11/01/19 1829  PT LONG TERM GOAL #1   Title Pt will report an improved bilat shoulder pain range of 0-4 with daily activities    Baseline 3-10/10    Time 7    Period Weeks    Status New    Target Date 12/10/19      PT LONG TERM GOAL #2   Title Pt will be Ind in a final HEP    Baseline In progress    Time 7    Period Weeks    Status New    Target Date 12/10/19                 Plan - 11/21/19 1532    Clinical Impression Statement Pt tolerated progression of ther ex to address bilat shoulder strength and for core strengtheing. Bridging was added to the HEP. Pt participates c good effort. Subjective report indicates contontinued improvement in pain.    Personal Factors and Comorbidities Comorbidity 3+;Past/Current Experience    Examination-Activity Limitations Lift;Sleep;Reach Overhead    Stability/Clinical Decision Making Evolving/Moderate complexity    Clinical Decision Making Moderate    Rehab Potential Fair    PT Frequency 2x / week    PT Duration 6 weeks    PT Treatment/Interventions ADLs/Self Care Home Management;Cryotherapy;Electrical Stimulation;Moist Heat;Iontophoresis 76m/ml Dexamethasone;Therapeutic activities;Therapeutic exercise;Manual techniques;Patient/family education;Dry needling;Joint Manipulations;Taping    PT Next Visit Plan Continue to address posture, and shoulder/scapula and core strengthening.    PT Home Exercise Plan [id number] HEP to address forward flexed trunk. Bridging was added to HEP.    Consulted and Agree with Plan of Care Patient           Patient will benefit from skilled therapeutic intervention in order to improve the following deficits and impairments:  Decreased mobility, Decreased activity tolerance, Decreased strength, Postural dysfunction, Pain, Obesity, Impaired UE functional use  Visit Diagnosis: Chronic pain of both shoulders  Rotator cuff arthropathy of right  shoulder  Localized osteoarthritis of left shoulder     Problem List Patient Active Problem List   Diagnosis Date Noted  . COPD, group B, by GOLD 2017 classification (HR[place] 07/06/2019  . Multiple lung nodules 07/06/2019  . Diabetes mellitus (HL[place] 05/03/2019  . Type 2 diabetes mellitus with diabetic polyneuropathy, with long-term current use of insulin (HO[place] 05/03/2019  . Screening for viral disease 03/22/2019  . Encounter for current long term use of antiplatelet drug 03/22/2019  . Esophageal dysphagia 03/22/2019  . Gastroesophageal reflux disease 03/22/2019  . Hx of colonic polyps 03/22/2019  . Atrial tachycardia (HE[place]   . Left bundle branch block   . Chronic diastolic CHF (congestive heart failure) (HJacinto City   . Uncontrolled type 2 diabetes mellitus with complication (HSter[place]   . CKD (chronic kidney disease), stage III 02/02/2016  . Anemia, unspecified 04/08/2015  . Hemorrhoids 04/08/2015  . Osteoarthritis of right knee 04/05/2015  . HTN (hypertension) 04/05/2015  . Neuropathy in diabetes (HS[place] 04/05/2015  . Hyperlipidemia 04/05/2015  . Pain and swelling of left lower leg 04/05/2015  . Carpal tunnel syndrome of left wrist 12/18/2014  . Pain in left knee 12/18/2014  . Vitamin D deficiency 11/29/2014  . Hyperlipidemia associated with type 2 diabetes mellitus (H[place] 08/31/2014  . Chronic cholecystitis with calculus 09/15/2013  . Disorder of synovium, tendon, and bursa 09/14/2013  . Feces contents abnormal 09/14/2013  . Former smoker 09/14/2013  . Morbid obesity with BMI of 40.0-44.9, adult (HNorth Midd[place] 09/06/2013  . 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 Constipation  11/08/2012  . Insulin dependent diabetes mellitus ([place]) 06/28/2012  . PAD (peripheral artery disease) ([place]) 06/28/2012  . Intermittent claudication ([place]) 06/28/2012  . Fibromyalgia 06/20/2012  . Shoulder pain 06/20/2012  . Lumbar postlaminectomy syndrome 06/20/2012  . Chronic pain  04/20/2012  . Migraine 03/31/2012   Gar Ponto MS, PT 11/21/19 3:38 PM   PHYSICAL THERAPY DISCHARGE SUMMARY  Visits from Start of Care: 8  Current functional level related to goals / functional outcomes: Unknown- Pt self DCed   Remaining deficits: Unknown- pt self DCed   Education / Equipment: HEP Plan: Patient agrees to discharge.  Patient goals were not met. Patient is being discharged due to not returning since the last visit.  ?????          Gar Ponto MS, PT 06/11/20 9:57 AM   [facility] [address] [place], [place], [address] Phone: [phone]   Fax:  [phone]  Name: Christina Solis MRN: [id number] Date of Birth: November 04, 1950

## 2019-11-23 ENCOUNTER — Ambulatory Visit: Payer: Medicare Other

## 2019-11-28 ENCOUNTER — Ambulatory Visit: Payer: Medicare Other

## 2019-11-30 ENCOUNTER — Ambulatory Visit: Payer: Medicare Other

## 2019-12-05 ENCOUNTER — Ambulatory Visit: Payer: Medicare Other

## 2019-12-07 ENCOUNTER — Encounter: Payer: Self-pay | Admitting: Internal Medicine

## 2019-12-07 ENCOUNTER — Ambulatory Visit (INDEPENDENT_AMBULATORY_CARE_PROVIDER_SITE_OTHER): Payer: Medicare Other | Admitting: Internal Medicine

## 2019-12-07 ENCOUNTER — Other Ambulatory Visit: Payer: Self-pay

## 2019-12-07 VITALS — BP 104/58 | HR 54 | Ht 63.0 in | Wt 235.0 lb

## 2019-12-07 DIAGNOSIS — I6521 Occlusion and stenosis of right carotid artery: Secondary | ICD-10-CM | POA: Diagnosis not present

## 2019-12-07 DIAGNOSIS — E118 Type 2 diabetes mellitus with unspecified complications: Secondary | ICD-10-CM | POA: Diagnosis not present

## 2019-12-07 DIAGNOSIS — E1165 Type 2 diabetes mellitus with hyperglycemia: Secondary | ICD-10-CM

## 2019-12-07 DIAGNOSIS — IMO0002 Reserved for concepts with insufficient information to code with codable children: Secondary | ICD-10-CM

## 2019-12-07 LAB — BASIC METABOLIC PANEL
BUN: 18 mg/dL (ref 6–23)
CO2: 32 mEq/L (ref 19–32)
Calcium: 9 mg/dL (ref 8.4–10.5)
Chloride: 101 mEq/L (ref 96–112)
Creatinine, Ser: 1.34 mg/dL — ABNORMAL HIGH (ref 0.40–1.20)
GFR: 47.65 mL/min — ABNORMAL LOW (ref >60.00)
Glucose, Bld: 199 mg/dL — ABNORMAL HIGH (ref 70–99)
Potassium: 4 mEq/L (ref 3.5–5.1)
Sodium: 137 mEq/L (ref 135–145)

## 2019-12-07 LAB — LDL CHOLESTEROL, DIRECT: Direct LDL: 69 mg/dL

## 2019-12-07 LAB — LIPID PANEL
Cholesterol: 157 mg/dL (ref 0–200)
HDL: 20.6 mg/dL — ABNORMAL LOW (ref >39.00)
Total CHOL/HDL Ratio: 8
Triglycerides: 446 mg/dL — ABNORMAL HIGH (ref 0.0–149.0)

## 2019-12-07 LAB — POCT GLYCOSYLATED HEMOGLOBIN (HGB A1C): Hemoglobin A1C: 6.8 % — AB (ref 4.0–5.6)

## 2019-12-07 NOTE — Progress Notes (Signed)
Name: Christina Solis  Age/ Sex: 69 y.o., female   MRN/ DOB: [id number], 1951/03/20     PCP: [name], NP   Reason for Endocrinology Evaluation: Type 2 Diabetes Mellitus  Initial Endocrine Consultative Visit:  05/03/2019    PATIENT IDENTIFIER: [name] is a 69 y.o. female with a past medical history of HTN, COPD, and T2DM. The patient has followed with Endocrinology clinic since 05/03/2019 for consultative assistance with management of her diabetes.  DIABETIC HISTORY:  Christina Solis was diagnosed with T2 DM in 2008 .she was on metformin that was stopped due to CKD, has been on insulin since 2009. Her hemoglobin A1c has ranged from 6.9% in 2015, peaking at 7.4% in 2020.  On her initial visit to our clinic she had an A1c of 7.4%, she was on Humalog mix, which was continued and adjusted.    She is a retired [employer] scientist Lives with daughter SUBJECTIVE:   During the last visit (08/03/2019): A1c 6.9% . We continued insulin mix   Today (12/07/2019): Christina Solis is here for follow-up on diabetes management. She checks her blood sugars occasionally. The patient has not had hypoglycemic episodes since the last clinic visit.    The pt  is requesting a referral to cardiology due to carotid artery calcifications that was incidentally found on a cervical X-Ray during evaluation of neck pain by ortho. Pt was advised by ortho to have cardiology evaluation.   She denies chest pain per se, but endorses skin sensitivity over the chest that attributes to fibromyalgia Has dyspnea if she walks long distance  Has leg pains associated with edema     HOME DIABETES REGIMEN:  Humalog mix 24 units with breakfast and 22 units with supper  Statin: Yes ACE-I/ARB: Yes    METER DOWNLOAD SUMMARY: Date range evaluated: 7/9- 12/07/2019 Fingerstick Blood Glucose Tests = 8 Average Number Tests/Day = 0.6 Overall Mean FS Glucose = 174   BG Ranges Low = 138 High = 210   Hypoglycemic Events/30  Days: BG < 50 = 0 Episodes of symptomatic severe hypoglycemia = 0    DIABETIC COMPLICATIONS: Microvascular complications:   Neuropathy  Denies: retinopathy  Last eye exam: Completed 01/2019  Macrovascular complications:   CHF, PVD  Denies: CAD, CVA   HISTORY:  Past Medical History:  Past Medical History:  Diagnosis Date  . Anemia    sickle cell trait  . Anxiety   . Arthritis    "knees" (10/12/2013)  . CHF (congestive heart failure) ([place])   . Chronic back pain   . Chronic bronchitis ([place])    "got it q yr when I lived in [place]"  . Complication of anesthesia    hard to put under  . COPD (chronic obstructive pulmonary disease) ([place])   . Dysrhythmia   . Family history of anesthesia complication    sister hard to wake up  . Family history of anesthesia complication    niece have itching  . Fibromyalgia   . GERD (gastroesophageal reflux disease)   . Hyperlipidemia   . Hypertension   . Irregular heart beat   . Migraine    "used to get them alot; not regular anymore" (10/12/2013)  . PAD (peripheral artery disease) ([place])   . Pituitary tumor    followed at [facility]; on cabergoline 09/2013  . Pneumonia    "twice"  . Type II diabetes mellitus ([place])    Past Surgical History:  Past Surgical History:  Procedure Laterality  Date  . ABDOMINAL AORTAGRAM N/A 06/28/2012   Procedure: ABDOMINAL Maxcine Ham;  Surgeon: Laverda Page, MD;  Location: [facility] CATH LAB;  Service: Cardiovascular;  Laterality: N/A;  . APPENDECTOMY    . BACK SURGERY     x 2, lower back  . BILATERAL CARPAL TUNNEL RELEASE    . CARDIAC CATHETERIZATION    . CESAREAN SECTION  1974; 1976; 1978  . CHOLECYSTECTOMY N/A 10/12/2013   Procedure: LAPAROSCOPIC CHOLECYSTECTOMY WITH INTRAOPERATIVE CHOLANGIOGRAM;  Surgeon: [name]. Georgette Dover, MD;  Location: [place];  Service: General;  Laterality: N/A;  . COLONOSCOPY    . FEMORAL ARTERY STENT Left 06/28/2012   SFA  . LAPAROSCOPIC CHOLECYSTECTOMY  10/12/2013   . LEFT HEART CATHETERIZATION WITH CORONARY ANGIOGRAM N/A 06/28/2012   Procedure: LEFT HEART CATHETERIZATION WITH CORONARY ANGIOGRAM;  Surgeon: Laverda Page, MD;  Location: [facility] CATH LAB;  Service: Cardiovascular;  Laterality: N/A;  . LOWER EXTREMITY ANGIOGRAM N/A 06/28/2012   Procedure: LOWER EXTREMITY ANGIOGRAM;  Surgeon: Laverda Page, MD;  Location: [facility] CATH LAB;  Service: Cardiovascular;  Laterality: N/A;  . PERIPHERAL VASCULAR CATHETERIZATION N/A 09/10/2015   Procedure: Abdominal Aortogram w/Lower Extremity;  Surgeon: Adrian Prows, MD;  Location: [place] CV LAB;  Service: Cardiovascular;  Laterality: N/A;  . POSTERIOR LUMBAR FUSION    . REPLACEMENT TOTAL KNEE Left   . TONSILLECTOMY    . TUBAL LIGATION  1978    Social History:  reports that she quit smoking about 4 years ago. Her smoking use included cigarettes. She started smoking about 19 years ago. She has a 23.00 pack-year smoking history. She has never used smokeless tobacco. She reports that she does not drink alcohol and does not use drugs. Family History:  Family History  Problem Relation Age of Onset  . Diabetes Mother   . Kidney disease Mother   . Cancer Father   . Pancreatic cancer Father   . Kidney disease Brother   . Cancer Brother   . Colon cancer Neg Hx   . Inflammatory bowel disease Neg Hx   . Esophageal cancer Neg Hx   . Liver disease Neg Hx   . Rectal cancer Neg Hx   . Stomach cancer Neg Hx      HOME MEDICATIONS: Allergies as of 12/07/2019      Reactions   Coconut Oil Hives      Medication List       Accurate as of December 07, 2019 10:44 AM. If you have any questions, ask your nurse or doctor.        aspirin EC 81 MG tablet Take 81 mg by mouth daily.   benzonatate 100 MG capsule Commonly known as: TESSALON Take 1 capsule (100 mg total) by mouth every 8 (eight) hours.   budesonide-formoterol 160-4.5 MCG/ACT inhaler Commonly known as: SYMBICORT Inhale 2 puffs into the lungs 2 (two) times  daily.   cetirizine-pseudoephedrine 5-120 MG tablet Commonly known as: ZYRTEC-D Take 1 tablet by mouth daily.   clopidogrel 75 MG tablet Commonly known as: PLAVIX Take 75 mg by mouth daily.   diclofenac Sodium 1 % Gel Commonly known as: VOLTAREN Apply topically.   dicyclomine 20 MG tablet Commonly known as: BENTYL Take 20 mg by mouth 4 (four) times daily as needed for spasms.   diltiazem 180 MG 24 hr capsule Commonly known as: CARDIZEM CD Take 1 capsule (180 mg total) by mouth daily.   famotidine 40 MG tablet Commonly known as: PEPCID Take 40 mg by mouth 2 (  two) times daily.   ferrous sulfate 325 (65 FE) MG tablet Take 325 mg by mouth daily with breakfast.   HumaLOG Mix 75/25 KwikPen (75-25) 100 UNIT/ML Kwikpen Generic drug: Insulin Lispro Prot & Lispro Inject 24 Units into the skin daily with breakfast AND 22 Units daily with supper.   lisinopril-hydrochlorothiazide 20-12.5 MG tablet Commonly known as: ZESTORETIC Take 1 tablet by mouth daily.   lubiprostone 24 MCG capsule Commonly known as: AMITIZA Take 24 mcg by mouth 2 (two) times daily with a meal.   meloxicam 15 MG tablet Commonly known as: MOBIC TAKE 1 TABLET BY MOUTH DAILY   methocarbamol 500 MG tablet Commonly known as: ROBAXIN Take 1 tablet (500 mg total) by mouth daily as needed for muscle spasms.   Olopatadine HCl 0.2 % Soln Apply 1 drop to eye daily.   omeprazole 40 MG capsule Commonly known as: PRILOSEC TAKE 1 CAPSULE(40 MG) BY MOUTH DAILY   oxyCODONE-acetaminophen 10-325 MG tablet Commonly known as: PERCOCET Take 1 tablet by mouth every 4 (four) hours as needed.   pregabalin 200 MG capsule Commonly known as: LYRICA Take 200 mg by mouth 3 (three) times daily.   ProAir HFA 108 (90 Base) MCG/ACT inhaler Generic drug: albuterol Inhale 1 puff into the lungs every 6 (six) hours as needed for wheezing or shortness of breath. ProAir   albuterol (2.5 MG/3ML) 0.083% nebulizer solution Commonly  known as: PROVENTIL Take 2.5 mg by nebulization every 6 (six) hours as needed for wheezing or shortness of breath.   promethazine 25 MG tablet Commonly known as: PHENERGAN Take 25 mg by mouth every 6 (six) hours as needed for nausea or vomiting.   rosuvastatin 10 MG tablet Commonly known as: CRESTOR Take 10 mg by mouth at bedtime.   Spiriva Respimat 2.5 MCG/ACT Aers Generic drug: Tiotropium Bromide Monohydrate Inhale 2 puffs into the lungs daily.   Spiriva Respimat 2.5 MCG/ACT Aers Generic drug: Tiotropium Bromide Monohydrate INHALE 2 PUFFS INTO THE LUNGS DAILY   Suprep Bowel Prep Kit 17.5-3.13-1.6 GM/177ML Soln Generic drug: Na Sulfate-K Sulfate-Mg Sulf Take 1 kit by mouth as directed. For colonoscopy prep   Vitamin D (Ergocalciferol) 1.25 MG (50000 UNIT) Caps capsule Commonly known as: DRISDOL Take 50,000 Units by mouth every Tuesday.        OBJECTIVE:   Vital Signs: BP (!) 104/58 (BP Location: Right Arm, Patient Position: Sitting, Cuff Size: Normal)   Pulse 54   Ht _0  (1.6 m)   Wt (!) 235 lb (106.6 kg)   SpO2 94%   BMI 41.63 kg/m   Wt Readings from Last 3 Encounters:  12/07/19 (!) 235 lb (106.6 kg)  08/03/19 231 lb (104.8 kg)  05/03/19 227 lb (103 kg)     Exam: General: Pt appears well and is in NAD  Lungs: Clear with good BS bilat with no rales, rhonchi, or wheezes  Heart: RRR with normal S1 and S2 and no gallops; no murmurs; no rub  Abdomen: Normoactive bowel sounds, soft, nontender, without masses or organomegaly palpable  Extremities: No pretibial edema.   Skin: Normal texture and temperature to palpation.  Neuro: MS is good with appropriate affect, pt is alert and Ox3    DM foot exam: 05/03/2019  The skin of the feet is intact without sores or ulcerations. The pedal pulses are 2+ on right and 2+ on left. The sensation is intact to a screening 5.07, 10 gram monofilament bilaterally    DATA REVIEWED:  Lab Results  Component  Value Date    HGBA1C 6.8 (A) 12/07/2019   HGBA1C 6.9 (A) 08/03/2019   HGBA1C 7.4 (A) 05/03/2019   Lab Results  Component Value Date   MICROALBUR 0.7 05/03/2019   CREATININE 0.96 05/03/2019   Lab Results  Component Value Date   MICRALBCREAT 0.6 05/03/2019   Results for Christina Solis, Christina Solis (MRN [id number]) as of 12/08/2019 12:36  Ref. Range 12/07/2019 11:10  Sodium Latest Ref Range: 135 - 145 mEq/L 137  Potassium Latest Ref Range: 3.5 - 5.1 mEq/L 4.0  Chloride Latest Ref Range: 96 - 112 mEq/L 101  CO2 Latest Ref Range: 19 - 32 mEq/L 32  Glucose Latest Ref Range: 70 - 99 mg/dL 199 (H)  BUN Latest Ref Range: 6 - 23 mg/dL 18  Creatinine Latest Ref Range: 0.40 - 1.20 mg/dL 1.34 (H)  Calcium Latest Ref Range: 8.4 - 10.5 mg/dL 9.0  GFR Latest Ref Range: >60.00 mL/min 47.65 (L)  Total CHOL/HDL Ratio Unknown 8  Cholesterol Latest Ref Range: 0 - 200 mg/dL 157  HDL Cholesterol Latest Ref Range: >39.00 mg/dL 20.60 (L)  Direct LDL Latest Units: mg/dL 69.0  Triglycerides Latest Ref Range: 0 - 149 mg/dL 446.0 Triglyceride is over 400; calculations on Lipids are invalid. (H)   ASSESSMENT / PLAN / RECOMMENDATIONS:   1) Type 2 Diabetes Mellitus, Optimally controlled, With neuropathic, CKD III and macro vascular complications - Most recent A1c of 6.8 %. Goal A1c < 7.0 %.    - A1c at goal, praised pt on current glucose control - No changes at this time  MEDICATIONS: - Humalog Mix  24 units with Breakfast and 22 units with supper   EDUCATION / INSTRUCTIONS:  BG monitoring instructions: Patient is instructed to check her blood sugars 2times a day, fasting and dinner.  Call [facility] if: BG persistently < 70 or > 300. . I reviewed the Rule of 15 for the treatment of hypoglycemia in detail with the patient. Literature supplied.   2) Diabetic complications:   Eye: Does not have known diabetic retinopathy.   Neuro/ Feet: Does have known diabetic peripheral neuropathy .   Renal: Patient does  not have known baseline CKD. She   is on an ACEI/ARB at present.   3) Right Carotid Artery Calcifications on X-Ray :   - Pt on rosuvastatin , LDL at goal  - Will refer to cardiology   4) Dyslipidemia:  - LDL at goal with rosuvastatin, but her Tg is elevated , will start fish oil first, if no improvement, will consider Vascepa.   F/U in 4 months    Signed electronically by: Mack Guise, MD  [facility]  [facility] [address] [place], [place] [address] Phone: [phone] FAX: [phone]   CC: [name], NP [place] [address] Phone: [phone]  Fax: [phone]  Return to Endocrinology clinic as below: Future Appointments  Date Time Provider [place]  12/08/2019 11:30 AM Ralls, Laurin Coder [facility]

## 2019-12-07 NOTE — Patient Instructions (Signed)
-   Humalog Mix  24 units with Breakfast and 22 units with supper     HOW TO TREAT LOW BLOOD SUGARS (Blood sugar LESS THAN 70 MG/DL)  Please follow the RULE OF 15 for the treatment of hypoglycemia treatment (when your (blood sugars are less than 70 mg/dL)    STEP 1: Take 15 grams of carbohydrates when your blood sugar is low, which includes:   3-4 GLUCOSE TABS  OR  3-4 OZ OF JUICE OR REGULAR SODA OR  ONE TUBE OF GLUCOSE GEL     STEP 2: RECHECK blood sugar in 15 MINUTES STEP 3: If your blood sugar is still low at the 15 minute recheck --> then, go back to STEP 1 and treat AGAIN with another 15 grams of carbohydrates.

## 2019-12-08 ENCOUNTER — Telehealth: Payer: Self-pay | Admitting: Internal Medicine

## 2019-12-08 ENCOUNTER — Ambulatory Visit: Payer: Medicare Other

## 2019-12-08 MED ORDER — FISH OIL EXTRA STRENGTH 1200 MG PO CAPS: 1200.0000 mg | ORAL_CAPSULE | Freq: Two times a day (BID) | ORAL | 3 refills | Status: DC

## 2019-12-08 NOTE — Telephone Encounter (Signed)
Please let Ms. Regal know that her "bad cholesterol " is good on the crestor but her triglycerides are high ( which is also part of her cholesterol ) I suggest starting over the counter fish oil 1200 mg TWice daily and continue crestor    Kidney function is stable     Thanks   [place], MD  [facility]  [facility] [address] [place], [place] [address] Phone: [phone] FAX: [phone]

## 2019-12-08 NOTE — Telephone Encounter (Signed)
Pt aware.

## 2019-12-19 ENCOUNTER — Other Ambulatory Visit: Payer: Self-pay | Admitting: Pain Medicine

## 2019-12-19 DIAGNOSIS — M79603 Pain in arm, unspecified: Secondary | ICD-10-CM

## 2019-12-19 DIAGNOSIS — M542 Cervicalgia: Secondary | ICD-10-CM

## 2019-12-21 ENCOUNTER — Ambulatory Visit: Payer: Medicare Other

## 2020-01-21 ENCOUNTER — Other Ambulatory Visit: Payer: Medicare Other

## 2020-02-15 ENCOUNTER — Other Ambulatory Visit: Payer: Self-pay

## 2020-02-15 ENCOUNTER — Ambulatory Visit: Payer: Medicare Other | Admitting: Cardiology

## 2020-02-15 ENCOUNTER — Encounter: Payer: Self-pay | Admitting: Cardiology

## 2020-02-15 VITALS — BP 122/59 | HR 107 | Resp 16 | Ht 62.0 in | Wt 219.0 lb

## 2020-02-15 DIAGNOSIS — E781 Pure hyperglyceridemia: Secondary | ICD-10-CM

## 2020-02-15 DIAGNOSIS — I6521 Occlusion and stenosis of right carotid artery: Secondary | ICD-10-CM

## 2020-02-15 DIAGNOSIS — M79604 Pain in right leg: Secondary | ICD-10-CM

## 2020-02-15 DIAGNOSIS — I779 Disorder of arteries and arterioles, unspecified: Secondary | ICD-10-CM

## 2020-02-15 MED ORDER — ICOSAPENT ETHYL 1 G PO CAPS: 2.0000 g | ORAL_CAPSULE | Freq: Two times a day (BID) | ORAL | 2 refills | Status: DC

## 2020-02-15 NOTE — Progress Notes (Signed)
Patient referred by Christina Harvey, NP for carotid artery calcification  Subjective:   Christina Solis, female    DOB: 1951-03-14, 69 y.o.   MRN: [id number]   Chief Complaint  Patient presents with   Carotid   New Patient (Initial Visit)     HPI  69 y/o Christina Solis female with hypertension, hyperlipidemia, PAD, prior tobacco abuse, referred for management of carotid artery calcification.  Cardiac testing patient was incidentally found on neck x-ray performed for her cervicalgia.  Patient does not have any TIA/stroke symptoms.  She also denies any chest pain, shortness of breath, claudication symptoms.  Recent lipid panel reviewed with the patient, details below. On a separate note, she reports bilateral leg pain on walking, which she has attributed to her knee arthritis. She has known PAD with prior SFA stenting.    Past Medical History:  Diagnosis Date   Anemia    sickle cell trait   Anxiety    Arthritis    "knees" (10/12/2013)   CHF (congestive heart failure) ([facility])    Chronic back pain    Chronic bronchitis ([place])    "got it q yr when I lived in [place]"   Complication of anesthesia    hard to put under   COPD (chronic obstructive pulmonary disease) ([place])    Dysrhythmia    Family history of anesthesia complication    sister hard to wake up   Family history of anesthesia complication    niece have itching   Fibromyalgia    GERD (gastroesophageal reflux disease)    Hyperlipidemia    Hypertension    Irregular heart beat    Migraine    "used to get them alot; not regular anymore" (10/12/2013)   PAD (peripheral artery disease) ([place])    Pituitary tumor    followed at [facility]; on cabergoline 09/2013   Pneumonia    "twice"   Type II diabetes mellitus ([place])      Past Surgical History:  Procedure Laterality Date   ABDOMINAL AORTAGRAM [place] 06/28/2012   Procedure: ABDOMINAL Maxcine Ham;  Surgeon: Christina Page, MD;  Location:  [facility] CATH LAB;  Service: Cardiovascular;  Laterality: [place];   APPENDECTOMY     BACK SURGERY     x 2, lower back   BILATERAL CARPAL TUNNEL RELEASE     CARDIAC CATHETERIZATION     CESAREAN SECTION  1974; 1976; Christina Solis 10/12/2013   Procedure: LAPAROSCOPIC CHOLECYSTECTOMY WITH INTRAOPERATIVE CHOLANGIOGRAM;  Surgeon: Christina Solis. Christina Dover, MD;  Location: [facility];  Service: General;  Laterality: Solis;   COLONOSCOPY     FEMORAL ARTERY STENT Left 06/28/2012   SFA   LAPAROSCOPIC CHOLECYSTECTOMY  10/12/2013   LEFT HEART CATHETERIZATION WITH CORONARY ANGIOGRAM [place] 06/28/2012   Procedure: LEFT HEART CATHETERIZATION WITH CORONARY ANGIOGRAM;  Surgeon: Christina Page, MD;  Location: [facility] CATH LAB;  Service: Cardiovascular;  Laterality: Solis;   LOWER EXTREMITY ANGIOGRAM [place] 06/28/2012   Procedure: LOWER EXTREMITY ANGIOGRAM;  Surgeon: Christina Page, MD;  Location: [facility] CATH LAB;  Service: Cardiovascular;  Laterality: [place];   PERIPHERAL VASCULAR CATHETERIZATION [place] 09/10/2015   Procedure: Abdominal Aortogram w/Lower Extremity;  Surgeon: Christina Prows, MD;  Location: [place] CV LAB;  Service: Cardiovascular;  Laterality: Solis;   POSTERIOR LUMBAR FUSION     REPLACEMENT TOTAL KNEE Left    TONSILLECTOMY     TUBAL LIGATION  1978     Social History   Tobacco Use  Smoking Status  Former Smoker   Packs/day: 0.50   Years: 46.00   Pack years: 23.00   Types: Cigarettes   Start date: 06/20/2000   Quit date: 09/29/2015   Years since quitting: 4.3  Smokeless Tobacco Never Used    Social History   Substance and Sexual Activity  Alcohol Use No   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 ProAir      albuterol (PROVENTIL) (2.5 MG/3ML) 0.083% nebulizer solution Take 2.5 mg by nebulization every 6 (six) hours as needed for wheezing or shortness of breath.      aspirin EC 81 MG tablet Take 81 mg by mouth daily.      budesonide-formoterol (SYMBICORT) 160-4.5 MCG/ACT inhaler Inhale 2 puffs into the lungs 2 (two) times daily.     cetirizine-pseudoephedrine (ZYRTEC-D) 5-120 MG tablet Take 1 tablet by mouth daily. 15 tablet 0   clopidogrel (PLAVIX) 75 MG tablet Take 75 mg by mouth daily.   1   diclofenac Sodium (VOLTAREN) 1 % GEL Apply topically.     dicyclomine (BENTYL) 20 MG tablet Take 20 mg by mouth 4 (four) times daily as needed for spasms.      ferrous sulfate 325 (65 FE) MG tablet Take 325 mg by mouth daily with breakfast.      HUMALOG MIX 75/25 KWIKPEN (75-25) 100 UNIT/ML Kwikpen Inject 24 Units into the skin daily with breakfast AND 22 Units daily with supper. 15 mL 11   lisinopril-hydrochlorothiazide (PRINZIDE,ZESTORETIC) 20-12.5 MG per tablet Take 1 tablet by mouth daily.      lubiprostone (AMITIZA) 24 MCG capsule Take 24 mcg by mouth 2 (two) times daily with a meal.     Olopatadine HCl 0.2 % SOLN Apply 1 drop to eye daily. 2.5 mL 0   omeprazole (PRILOSEC) 40 MG capsule TAKE 1 CAPSULE(40 MG) BY MOUTH DAILY 90 capsule 2   oxyCODONE-acetaminophen (PERCOCET) 10-325 MG tablet Take 1 tablet by mouth every 4 (four) hours as needed.     pregabalin (LYRICA) 200 MG capsule Take 200 mg by mouth 3 (three) times daily.     promethazine (PHENERGAN) 25 MG tablet Take 25 mg by mouth every 6 (six) hours as needed for nausea or vomiting.      rosuvastatin (CRESTOR) 10  MG tablet Take 10 mg by mouth at bedtime.      SPIRIVA RESPIMAT 2.5 MCG/ACT AERS INHALE 2 PUFFS INTO THE LUNGS DAILY 4 g 5   Vitamin D, Ergocalciferol, (DRISDOL) 50000 UNITS CAPS capsule Take 50,000 Units by mouth every Tuesday.      No current facility-administered medications on file prior to visit.    Cardiovascular and other pertinent studies:  EKG 02/15/2020: Sinus rhythm 100 bpm Left atrial enlargement Possible old  anteroseptal infarct   Neck Xray 10/2019: No fracture or spondylolisthesis. No appreciable arthropathy. Relative lack of lordosis raises question of a degree of muscle spasm.  There is right carotid artery calcification.  Echocardiogram 2018: - Left ventricle: The cavity size was normal. There was moderate  focal basal hypertrophy. Systolic function was normal. The  estimated ejection fraction was in the range of 55% to 60%.  Possible hypokinesis of the midanteroseptal myocardium. There was  an increased relative contribution of atrial contraction to  ventricular filling. Doppler parameters are consistent with  abnormal left ventricular relaxation (grade 1 diastolic  dysfunction). Doppler parameters are consistent with high  ventricular filling pressure.  - Aortic valve: There was trivial regurgitation.  - Right ventricle: The cavity size was mildly dilated. Wall  thickness was normal.  - Tricuspid valve: There was trivial regurgitation.  - Pulmonic valve: There was mild regurgitation.   Abdominal aortogram 2017: Findings:  Abdominal aortogram reveals presence of 2 renal arteries one on either sides. They're widely patent. Mild atherosclerotic changes of the abdominal aorta was evident. There is no evidence of dominant. Right Femoral Artery with Distal Runoff Revealed about a 50-60% Stenosis in the mid SFA. Below the right knee there is three-vessel runoff. Slow flow was evident in the below the knee vessel. Left femoral artery with distal  runoff: The left SFA stent in the mid to distal segment is widely patent. In the proximal to mid segment the left SFA shows a 30-40% stenosis. Below the knee there is three-vessel runoff. Impression: Mild to moderate diffuse disease, can be treated medically, she'll be discharged home without patient follow-up. Needs cardiac risk modification.     Recent labs: 12/07/2019: Glucose 199, BUN/Cr 18/1.34. EGFR 47. Na/K 137/4.0 HbA1C 6.8% Chol 157, TG 446, HDL 20, dLDL 69  09/2019: H/H 12/39. MCV 98. Platelets 248 Trop HS normal   Review of Systems  Cardiovascular: Negative for chest pain, dyspnea on exertion, leg swelling, palpitations and syncope.         Vitals:   02/15/20 1512  BP: (!) 122/59  Pulse: (!) 107  Resp: 16  SpO2: 98%     Body mass index is 40.06 kg/m. Filed Weights   02/15/20 1512  Weight: 219 lb (99.3 kg)     Objective:   Physical Exam Vitals and nursing note reviewed.  Constitutional:      General: She is not in acute distress. Neck:     Vascular: No JVD.  Cardiovascular:     Rate and Rhythm: Normal rate and regular rhythm.     Heart sounds: Normal heart sounds. No murmur heard.   Pulmonary:     Effort: Pulmonary effort is normal.     Breath sounds: Normal breath sounds. No wheezing or rales.            Assessment & Recommendations:   69 y/o Christina Solis female with hypertension, hyperlipidemia, PAD, prior tobacco abuse, referred for management of carotid artery calcification.  Carotid artery calcification: Incidental finding.  No TIA/stroke symptoms.  Recommend carotid ultrasound to corroborate these results.  Essentially, recommend aggressive risk factor modification.  Continue Plavix 75 mg daily.  We will stop aspirin 81 mg, and she does not need dual antiplatelet therapy at this time.  Continue high intensity statin.  Hypertriglyceridemia/type II DM: Currently on fish oil capsule.  Recommend Vascepa 2 g twice daily. Repeat  fasting lipid panel in 3 moths  PAD: Atypical leg pain. Will obtain baseline ABI  Thank you  for referring the patient to [place]. Please feel free to contact with any questions.   Nigel Mormon, MD Pager: [phone] Office: [phone]

## 2020-02-19 ENCOUNTER — Other Ambulatory Visit: Payer: Self-pay | Admitting: Pain Medicine

## 2020-02-19 DIAGNOSIS — M79603 Pain in arm, unspecified: Secondary | ICD-10-CM

## 2020-02-19 DIAGNOSIS — M542 Cervicalgia: Secondary | ICD-10-CM

## 2020-02-20 ENCOUNTER — Ambulatory Visit: Payer: Medicare Other

## 2020-02-20 ENCOUNTER — Other Ambulatory Visit: Payer: Self-pay

## 2020-02-20 DIAGNOSIS — I6523 Occlusion and stenosis of bilateral carotid arteries: Secondary | ICD-10-CM

## 2020-02-20 DIAGNOSIS — I6521 Occlusion and stenosis of right carotid artery: Secondary | ICD-10-CM

## 2020-02-21 ENCOUNTER — Other Ambulatory Visit: Payer: Self-pay | Admitting: Cardiology

## 2020-02-21 DIAGNOSIS — I6523 Occlusion and stenosis of bilateral carotid arteries: Secondary | ICD-10-CM | POA: Insufficient documentation

## 2020-02-21 NOTE — Progress Notes (Signed)
Unable to reach pt. Left vm to cb. 

## 2020-02-21 NOTE — Progress Notes (Signed)
Relayed information to patient. Patient voiced understanding,.

## 2020-02-29 ENCOUNTER — Ambulatory Visit: Payer: Medicare Other

## 2020-02-29 ENCOUNTER — Other Ambulatory Visit: Payer: Self-pay

## 2020-02-29 DIAGNOSIS — M79604 Pain in right leg: Secondary | ICD-10-CM

## 2020-02-29 DIAGNOSIS — M79605 Pain in left leg: Secondary | ICD-10-CM

## 2020-02-29 DIAGNOSIS — I739 Peripheral vascular disease, unspecified: Secondary | ICD-10-CM

## 2020-03-04 NOTE — Progress Notes (Signed)
Relayed information to patient. Patient voiced understanding.  

## 2020-03-10 ENCOUNTER — Ambulatory Visit
Admission: RE | Admit: 2020-03-10 | Discharge: 2020-03-10 | Disposition: A | Discharge status: Home / Self Care | Payer: Medicare Other | Source: Ambulatory Visit | Attending: Pain Medicine | Admitting: Pain Medicine

## 2020-03-10 ENCOUNTER — Other Ambulatory Visit: Payer: Self-pay

## 2020-03-10 DIAGNOSIS — M79603 Pain in arm, unspecified: Secondary | ICD-10-CM

## 2020-03-10 DIAGNOSIS — M542 Cervicalgia: Secondary | ICD-10-CM

## 2020-03-14 ENCOUNTER — Other Ambulatory Visit: Payer: Self-pay | Admitting: Pain Medicine

## 2020-03-14 ENCOUNTER — Ambulatory Visit
Admission: RE | Admit: 2020-03-14 | Discharge: 2020-03-14 | Disposition: A | Discharge status: Home / Self Care | Payer: Medicare Other | Source: Ambulatory Visit | Attending: Pain Medicine | Admitting: Pain Medicine

## 2020-03-14 DIAGNOSIS — M25511 Pain in right shoulder: Secondary | ICD-10-CM

## 2020-04-17 ENCOUNTER — Other Ambulatory Visit: Payer: Self-pay | Admitting: *Deleted

## 2020-04-17 DIAGNOSIS — J449 Chronic obstructive pulmonary disease, unspecified: Secondary | ICD-10-CM

## 2020-04-19 ENCOUNTER — Inpatient Hospital Stay (HOSPITAL_COMMUNITY): Admission: RE | Admit: 2020-04-19 | Payer: Medicare Other | Source: Ambulatory Visit

## 2020-04-22 ENCOUNTER — Ambulatory Visit: Payer: Medicare Other | Admitting: Pulmonary Disease

## 2020-04-22 NOTE — Progress Notes (Deleted)
Subjective:   PATIENT ID: Christina Solis: female DOB: Sep 30, 1950, MRN: [id number]   HPI  No chief complaint on file.  Reason for Visit: Follow-up  Christina Solis is a 69 year old female with COPD with chronic bronchitis, DM2 with peripheral neuropathy, stage IIB CKD and morbid obesity who presents for follow-up for COPD.  Synopsis: She was diagnosed with COPD 9 years ago. She previously smoked 2ppd x 20 years and started cutting back in 2007 and finally quit 2017. She previously was seen by a Pulmonologist when she lived in [place]. She has been hospitalized three times for her COPD/pneumonia with the last hospitalization in 2017, but has never been intubated.   She has been compliant with Spiriva and feels inhaler is doing overall well. Denies shortness of breath, wheezing or cough. Today, she cannot present to the office due to viral illness that she caught from her granddaughter and daughter. Before this, she was doing good. She has not needed to use albuterol even while she is sick. Last time she needed steroids was in 2017.  She previously used her Albuterol once a day but whenever she is sick, the severity of sickness is bad and she requires albuterol every 4-6 hours. Last year was her most recent exacerbation requiring steroids. She does not like using prednisone as it causes her blood sugar increases and causes her to feel giddy. She has been on Incruse in the past and only felt like it helped her symptoms somewhat.  Social History: Quit smoking 2017.  Retired Marine scientist in hospital, hospice, nursing homes  I have personally reviewed patient's past medical/family/social history/allergies/current medications. Past Medical History:  Diagnosis Date  . Anemia    sickle cell trait  . Anxiety   . Arthritis    "knees" (10/12/2013)  . CHF (congestive heart failure) ([place])   . Chronic back pain   . Chronic bronchitis ([place])    "got it q yr when I lived in [place]"  . Complication of  anesthesia    hard to put under  . COPD (chronic obstructive pulmonary disease) ([place])   . Dysrhythmia   . Family history of anesthesia complication    sister hard to wake up  . Family history of anesthesia complication    niece have itching  . Fibromyalgia   . GERD (gastroesophageal reflux disease)   . Hyperlipidemia   . Hypertension   . Irregular heart beat   . Migraine    "used to get them alot; not regular anymore" (10/12/2013)  . PAD (peripheral artery disease) ([place])   . Pituitary tumor    followed at [facility]; on cabergoline 09/2013  . Pneumonia    "twice"  . Type II diabetes mellitus ([place])      Outpatient Medications Prior to Visit  Medication Sig Dispense Refill  . albuterol (PROAIR HFA) 108 (90 BASE) MCG/ACT inhaler Inhale 1 puff into the lungs every 6 (six) hours as needed for wheezing or shortness of breath. ProAir     . albuterol (PROVENTIL) (2.5 MG/3ML) 0.083% nebulizer solution Take 2.5 mg by nebulization every 6 (six) hours as needed for wheezing or shortness of breath.     . budesonide-formoterol (SYMBICORT) 160-4.5 MCG/ACT inhaler Inhale 2 puffs into the lungs 2 (two) times daily.    . cetirizine-pseudoephedrine (ZYRTEC-D) 5-120 MG tablet Take 1 tablet by mouth daily. 15 tablet 0  . clopidogrel (PLAVIX) 75 MG tablet Take 75 mg by mouth daily.   1  . diclofenac  Sodium (VOLTAREN) 1 % GEL Apply topically.    . dicyclomine (BENTYL) 20 MG tablet Take 20 mg by mouth 4 (four) times daily as needed for spasms.     . ferrous sulfate 325 (65 FE) MG tablet Take 325 mg by mouth daily with breakfast.     . HUMALOG MIX 75/25 KWIKPEN (75-25) 100 UNIT/ML Kwikpen Inject 24 Units into the skin daily with breakfast AND 22 Units daily with supper. 15 mL 11  . icosapent Ethyl (VASCEPA) 1 g capsule Take 2 capsules (2 g total) by mouth 2 (two) times daily. 120 capsule 2  . lisinopril-hydrochlorothiazide (PRINZIDE,ZESTORETIC) 20-12.5 MG per tablet Take 1 tablet by mouth  daily.     Marland Kitchen lubiprostone (AMITIZA) 24 MCG capsule Take 24 mcg by mouth 2 (two) times daily with a meal.    . Olopatadine HCl 0.2 % SOLN Apply 1 drop to eye daily. 2.5 mL 0  . omeprazole (PRILOSEC) 40 MG capsule TAKE 1 CAPSULE(40 MG) BY MOUTH DAILY 90 capsule 2  . oxyCODONE-acetaminophen (PERCOCET) 10-325 MG tablet Take 1 tablet by mouth every 4 (four) hours as needed.    . pregabalin (LYRICA) 200 MG capsule Take 200 mg by mouth 3 (three) times daily.    . promethazine (PHENERGAN) 25 MG tablet Take 25 mg by mouth every 6 (six) hours as needed for nausea or vomiting.     . rosuvastatin (CRESTOR) 10 MG tablet Take 10 mg by mouth at bedtime.     Marland Kitchen SPIRIVA RESPIMAT 2.5 MCG/ACT AERS INHALE 2 PUFFS INTO THE LUNGS DAILY 4 g 5  . Vitamin D, Ergocalciferol, (DRISDOL) 50000 UNITS CAPS capsule Take 50,000 Units by mouth every Tuesday.      No facility-administered medications prior to visit.    Review of Systems  Constitutional: Negative for chills, diaphoresis, fever, malaise/fatigue and weight loss.  HENT: Negative for congestion.   Respiratory: Negative for cough, hemoptysis, sputum production, shortness of breath and wheezing.   Cardiovascular: Negative for chest pain, palpitations and leg swelling.     Objective:   There were no vitals filed for this visit.   Physical Exam:   Data Reviewed:  Imaging: CTA 02/01/16 - Peripheral patchy airspace disease, 5-61mm RLL lung nodule CT Chest 07/03/19 - Multiple subcentimeter pulmonary nodules including 67mm and 92mm nodule in RLL and 9mm nodule in RUL and 5 mm nodule LLL, unchanged compared to 2017  PFT: None on file  Imaging, labs and test noted above have been reviewed independently by me.   Assessment & Plan:   Discussion: 69 year old female former smoker with COPD with chronic bronchitis who presents for follow-up.  Mild COPD, GOLD Class B --CONTINUE Spiriva 2.5 mcg TWO puffs ONCE a day --CONTINUE Albuterol as needed for shortness of  breath or wheezing --Will arrange for PFTs in the future  Multiple benign subcentimeter lung nodules, unchanged >2 years --No further imaging indicated --Would continue routine image surveillance for lung cancer screening  Health Maintenance Immunization History  Administered Date(s) Administered  . Hepatitis B, adult 06/18/2000  . Influenza Split 01/17/2011, 03/31/2012  . Influenza,inj,Quad PF,6+ Mos 02/20/2015, 02/20/2016, 02/05/2017  . Influenza,inj,quad, With Preservative 07/03/2013  . Influenza-Unspecified 02/05/2017, 03/20/2019  . Pneumococcal Conjugate-13 12/28/2017  . Pneumococcal Polysaccharide-23 01/16/2010  . Tdap 05/18/2000   CT Lung Screen - Due 06/2020  No orders of the defined types were placed in this encounter.  No orders of the defined types were placed in this encounter.  No follow-ups on file.  [place], MD [facility] 04/22/2020 9:29 AM  Office Number [phone]

## 2020-05-12 ENCOUNTER — Other Ambulatory Visit: Payer: Self-pay | Admitting: Cardiology

## 2020-05-12 DIAGNOSIS — E781 Pure hyperglyceridemia: Secondary | ICD-10-CM

## 2020-05-16 ENCOUNTER — Ambulatory Visit: Payer: Medicare Other | Admitting: Cardiology

## 2020-05-30 ENCOUNTER — Ambulatory Visit: Payer: Medicare Other | Admitting: Cardiology

## 2020-06-03 ENCOUNTER — Other Ambulatory Visit: Payer: Self-pay | Admitting: Gastroenterology

## 2020-06-07 ENCOUNTER — Ambulatory Visit: Payer: Medicare Other | Admitting: Cardiology

## 2020-06-07 ENCOUNTER — Other Ambulatory Visit: Payer: Self-pay

## 2020-06-07 ENCOUNTER — Encounter: Payer: Self-pay | Admitting: Cardiology

## 2020-06-07 VITALS — BP 95/78 | HR 83 | Temp 97.5°F | Resp 16 | Ht 62.0 in | Wt 226.0 lb

## 2020-06-07 DIAGNOSIS — I6523 Occlusion and stenosis of bilateral carotid arteries: Secondary | ICD-10-CM

## 2020-06-07 DIAGNOSIS — I739 Peripheral vascular disease, unspecified: Secondary | ICD-10-CM

## 2020-06-07 DIAGNOSIS — I6521 Occlusion and stenosis of right carotid artery: Secondary | ICD-10-CM

## 2020-06-07 NOTE — Progress Notes (Signed)
Patient referred by [name], NP for carotid artery calcification  Subjective:   Christina Solis, female    DOB: Apr 24, 1951, 70 y.o.   MRN: [id number]   Chief Complaint  Patient presents with   Right-sided carotid artery disease, unspecified type    Follow-up   Results     HPI  70 y/o [place] American female with hypertension, hyperlipidemia, prior tobacco abuse, mod carotid artery stenosis, PAD without claudication.  Patient is doing well without any symptoms. She is compliant with her medical therapy. Reviewed recent carotid US and ABI results with the patient, details below.     Current Outpatient Medications on File Prior to Visit  Medication Sig Dispense Refill   albuterol (PROAIR HFA) 108 (90 BASE) MCG/ACT inhaler Inhale 1 puff into the lungs every 6 (six) hours as needed for wheezing or shortness of breath. ProAir      albuterol (PROVENTIL) (2.5 MG/3ML) 0.083% nebulizer solution Take 2.5 mg by nebulization every 6 (six) hours as needed for wheezing or shortness of breath.      budesonide-formoterol (SYMBICORT) 160-4.5 MCG/ACT inhaler Inhale 2 puffs into the lungs 2 (two) times daily.     cetirizine-pseudoephedrine (ZYRTEC-D) 5-120 MG tablet Take 1 tablet by mouth daily. 15 tablet 0   clopidogrel (PLAVIX) 75 MG tablet Take 75 mg by mouth daily.   1   diclofenac Sodium (VOLTAREN) 1 % GEL Apply topically.     dicyclomine (BENTYL) 20 MG tablet Take 20 mg by mouth 4 (four) times daily as needed for spasms.      ferrous sulfate 325 (65 FE) MG tablet Take 325 mg by mouth daily with breakfast.      HUMALOG MIX 75/25 KWIKPEN (75-25) 100 UNIT/ML Kwikpen Inject 24 Units into the skin daily with breakfast AND 22 Units daily with supper. 15 mL 11   icosapent Ethyl (VASCEPA) 1 g capsule TAKE 2 CAPSULES(2 GRAMS) BY MOUTH TWICE DAILY 120 capsule 2   lisinopril-hydrochlorothiazide (PRINZIDE,ZESTORETIC) 20-12.5 MG per tablet Take 1 tablet by mouth daily.       lubiprostone (AMITIZA) 24 MCG capsule Take 24 mcg by mouth 2 (two) times daily with a meal.     Olopatadine HCl 0.2 % SOLN Apply 1 drop to eye daily. 2.5 mL 0   omeprazole (PRILOSEC) 40 MG capsule TAKE 1 CAPSULE(40 MG) BY MOUTH DAILY 90 capsule 2   oxyCODONE-acetaminophen (PERCOCET) 10-325 MG tablet Take 1 tablet by mouth every 4 (four) hours as needed.     pregabalin (LYRICA) 200 MG capsule Take 200 mg by mouth 3 (three) times daily.     promethazine (PHENERGAN) 25 MG tablet Take 25 mg by mouth every 6 (six) hours as needed for nausea or vomiting.      rosuvastatin (CRESTOR) 10 MG tablet Take 10 mg by mouth at bedtime.      SPIRIVA RESPIMAT 2.5 MCG/ACT AERS INHALE 2 PUFFS INTO THE LUNGS DAILY 4 g 5   Vitamin D, Ergocalciferol, (DRISDOL) 50000 UNITS CAPS capsule Take 50,000 Units by mouth every Tuesday.      No current facility-administered medications on file prior to visit.    Cardiovascular and other pertinent studies:  ABI 02/28/2020:  This exam reveals moderately decreased perfusion of the right lower  extremity, noted at the dorsalis pedis and post tibial artery level (ABI  0.75) and normal perfusion of the left lower extremity (ABI 1.00).  There is mildly abnormal biphasic waveform at the ankles.  Carotid artery duplex 02/20/2020:  Stenosis in the right internal carotid artery (50-69%).  Stenosis in the left internal carotid artery (50-69%), lower end of  spectrum.  Antegrade right vertebral artery flow. Antegrade left vertebral artery  flow.  Follow up in six months is appropriate if clinically indicated.  EKG 02/15/2020: Sinus rhythm 100 bpm Left atrial enlargement Possible old anteroseptal infarct   Neck Xray 10/2019: No fracture or spondylolisthesis. No appreciable arthropathy. Relative lack of lordosis raises question of a degree of muscle spasm.  There is right carotid artery calcification.  Echocardiogram 2018: - Left ventricle: The cavity size was  normal. There was moderate  focal basal hypertrophy. Systolic function was normal. The  estimated ejection fraction was in the range of 55% to 60%.  Possible hypokinesis of the midanteroseptal myocardium. There was  an increased relative contribution of atrial contraction to  ventricular filling. Doppler parameters are consistent with  abnormal left ventricular relaxation (grade 1 diastolic  dysfunction). Doppler parameters are consistent with high  ventricular filling pressure.  - Aortic valve: There was trivial regurgitation.  - Right ventricle: The cavity size was mildly dilated. Wall  thickness was normal.  - Tricuspid valve: There was trivial regurgitation.  - Pulmonic valve: There was mild regurgitation.   Abdominal aortogram 2017: Findings:  Abdominal aortogram reveals presence of 2 renal arteries one on either sides. They're widely patent. Mild atherosclerotic changes of the abdominal aorta was evident. There is no evidence of dominant. Right Femoral Artery with Distal Runoff Revealed about a 50-60% Stenosis in the mid SFA. Below the right knee there is three-vessel runoff. Slow flow was evident in the below the knee vessel. Left femoral artery with distal runoff: The left SFA stent in the mid to distal segment is widely patent. In the proximal to mid segment the left SFA shows a 30-40% stenosis. Below the knee there is three-vessel runoff. Impression: Mild to moderate diffuse disease, can be treated medically, she'll be discharged home without patient follow-up. Needs cardiac risk modification.     Recent labs: 12/07/2019: Glucose 199, BUN/Cr 18/1.34. EGFR 47. Na/K 137/4.0 HbA1C 6.8% Chol 157, TG 446, HDL 20, dLDL 69  09/2019: H/H 12/39. MCV 98. Platelets 248 Trop HS normal   Review of Systems  Cardiovascular: Negative for chest pain, dyspnea on exertion, leg swelling, palpitations and syncope.         Vitals:   06/07/20 1349  BP: 95/78  Pulse:  83  Resp: 16  Temp: (!) 97.5 F (36.4 C)  SpO2: 92%     Body mass index is 41.34 kg/m. Filed Weights   06/07/20 1349  Weight: 226 lb (102.5 kg)     Objective:   Physical Exam Vitals and nursing note reviewed.  Constitutional:      General: She is not in acute distress. Neck:     Vascular: No JVD.  Cardiovascular:     Rate and Rhythm: Normal rate and regular rhythm.     Heart sounds: Normal heart sounds. No murmur heard.   Pulmonary:     Effort: Pulmonary effort is normal.     Breath sounds: Normal breath sounds. No wheezing or rales.            Assessment & Recommendations:   70 y/o [place] American female with hypertension, hyperlipidemia, prior tobacco abuse, mod carotid artery stenosis, PAD without claudication.  Carotid artery calcification: Mod b/o carotid artery stenoses. Asymptomatic. Continue Plavix, satin  Incidental finding.  No TIA/stroke symptoms.  Recommend carotid ultrasound to corroborate these results.  Essentially, recommend aggressive risk factor modification.  Continue Plavix 75 mg daily.  We will stop aspirin 81 mg, and she does not need dual antiplatelet therapy at this time.  Continue high intensity statin.  Hypertriglyceridemia/type II DM: TG elevated in 11/2019. Will get lipid panel results from PCP.  PAD: No claudication. Continue medical management.  F/u in 1 year   Nigel Mormon, MD Pager: [phone] Office: [phone]

## 2020-06-13 ENCOUNTER — Ambulatory Visit: Payer: Medicaid Other | Admitting: Internal Medicine

## 2020-06-13 NOTE — Progress Notes (Deleted)
Name: Christina Solis  Age/ Sex: 70 y.o., female   MRN/ DOB: [id number], 09/18/1950     PCP: [name], NP   Reason for Endocrinology Evaluation: Type 2 Diabetes Mellitus  Initial Endocrine Consultative Visit:  05/03/2019    PATIENT IDENTIFIER: Christina Solis is a 70 y.o. female with a past medical history of HTN, COPD, and T2DM. The patient has followed with Endocrinology clinic since 05/03/2019 for consultative assistance with management of her diabetes.  DIABETIC HISTORY:  Christina Solis was diagnosed with T2 DM in 2008 .she was on metformin that was stopped due to CKD, has been on insulin since 2009. Her hemoglobin A1c has ranged from 6.9% in 2015, peaking at 7.4% in 2020.  On her initial visit to our clinic she had an A1c of 7.4%, she was on Humalog mix, which was continued and adjusted.    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 We continued insulin mix   Today (06/13/2020): Christina Solis is here for follow-up on diabetes management. She checks her blood sugars occasionally. The patient has not had hypoglycemic episodes since the last clinic visit.    The pt  is requesting a referral to cardiology due to carotid artery calcifications that was incidentally found on a cervical X-Ray during evaluation of neck pain by ortho. Pt was advised by ortho to have cardiology evaluation.   She denies chest pain per se, but endorses skin sensitivity over the chest that attributes to fibromyalgia Has dyspnea if she walks long distance  Has leg pains associated with edema     HOME DIABETES REGIMEN:  Humalog mix 24 units with breakfast and 22 units with supper  Statin: Yes ACE-I/ARB: Yes    METER DOWNLOAD SUMMARY: Date range evaluated: 7/9- 12/07/2019 Fingerstick Blood Glucose Tests = 8 Average Number Tests/Day = 0.6 Overall Mean FS Glucose = 174   BG Ranges Low = 138 High = 210   Hypoglycemic Events/30  Days: BG < 50 = 0 Episodes of symptomatic severe hypoglycemia = 0    DIABETIC COMPLICATIONS: Microvascular complications:   Neuropathy  Denies: retinopathy  Last eye exam: Completed 01/2019  Macrovascular complications:   CHF, PVD  Denies: CAD, CVA   HISTORY:  Past Medical History:  Past Medical History:  Diagnosis Date   Anemia    sickle cell trait   Anxiety    Arthritis    "knees" (10/12/2013)   CHF (congestive heart failure) (HCC)    Chronic back pain    Chronic bronchitis ([place])    "got it q yr when I lived in [place]"   Complication of anesthesia    hard to put under   COPD (chronic obstructive pulmonary disease) ([place])    Dysrhythmia    Family history of anesthesia complication    sister hard to wake up   Family history of anesthesia complication    niece have itching   Fibromyalgia    GERD (gastroesophageal reflux disease)    Hyperlipidemia    Hypertension    Irregular heart beat    Migraine    "used to get them alot; not regular anymore" (10/12/2013)   PAD (peripheral artery disease) ([place])    Pituitary tumor    followed at [facility] Endocrinology; on cabergoline 09/2013   Pneumonia    "twice"   Type II diabetes mellitus ([place])    Past Surgical History:  Past Surgical History:  Procedure Laterality  Date   ABDOMINAL AORTAGRAM N/A 06/28/2012   Procedure: ABDOMINAL Maxcine Ham;  Surgeon: Laverda Page, MD;  Location: [facility] CATH LAB;  Service: Cardiovascular;  Laterality: N/A;   APPENDECTOMY     BACK SURGERY     x 2, lower back   BILATERAL CARPAL TUNNEL RELEASE     CARDIAC CATHETERIZATION     CESAREAN SECTION  1974; 1976; [place] N/A 10/12/2013   Procedure: LAPAROSCOPIC CHOLECYSTECTOMY WITH INTRAOPERATIVE CHOLANGIOGRAM;  Surgeon: [name]. Georgette Dover, MD;  Location: [place];  Service: General;  Laterality: N/A;   COLONOSCOPY     FEMORAL ARTERY STENT Left 06/28/2012   SFA   LAPAROSCOPIC CHOLECYSTECTOMY  10/12/2013    LEFT HEART CATHETERIZATION WITH CORONARY ANGIOGRAM N/A 06/28/2012   Procedure: LEFT HEART CATHETERIZATION WITH CORONARY ANGIOGRAM;  Surgeon: Laverda Page, MD;  Location: [facility] CATH LAB;  Service: Cardiovascular;  Laterality: N/A;   LOWER EXTREMITY ANGIOGRAM N/A 06/28/2012   Procedure: LOWER EXTREMITY ANGIOGRAM;  Surgeon: Laverda Page, MD;  Location: [facility] CATH LAB;  Service: Cardiovascular;  Laterality: N/A;   PERIPHERAL VASCULAR CATHETERIZATION N/A 09/10/2015   Procedure: Abdominal Aortogram w/Lower Extremity;  Surgeon: Adrian Prows, MD;  Location: [place] [facility] CV LAB;  Service: Cardiovascular;  Laterality: N/A;   POSTERIOR LUMBAR FUSION     REPLACEMENT TOTAL KNEE Left    TONSILLECTOMY     TUBAL LIGATION  1978    Social History:  reports that she quit smoking about 4 years ago. Her smoking use included cigarettes. She started smoking about 19 years ago. She has a 23.00 pack-year smoking history. She has never used smokeless tobacco. She reports that she does not drink alcohol and does not use drugs. Family History:  Family History  Problem Relation Age of Onset   Diabetes Mother    Kidney disease Mother    Cancer Father    Pancreatic cancer Father    Kidney disease Brother    Cancer Brother    Colon cancer Neg Hx    Inflammatory bowel disease Neg Hx    Esophageal cancer Neg Hx    Liver disease Neg Hx    Rectal cancer Neg Hx    Stomach cancer Neg Hx      HOME MEDICATIONS: Allergies as of 06/13/2020      Reactions   Coconut Oil Hives      Medication List       Accurate as of June 13, 2020  7:23 AM. If you have any questions, ask your nurse or doctor.        albuterol 108 (90 Base) MCG/ACT inhaler Commonly known as: VENTOLIN HFA Inhale 1 puff into the lungs every 6 (six) hours as needed for wheezing or shortness of breath. ProAir   albuterol (2.5 MG/3ML) 0.083% nebulizer solution Commonly known as: PROVENTIL Take 2.5 mg by nebulization every 6  (six) hours as needed for wheezing or shortness of breath.   benzonatate 200 MG capsule Commonly known as: TESSALON Take by mouth.   budesonide-formoterol 160-4.5 MCG/ACT inhaler Commonly known as: SYMBICORT Inhale 2 puffs into the lungs 2 (two) times daily.   cetirizine-pseudoephedrine 5-120 MG tablet Commonly known as: ZYRTEC-D Take 1 tablet by mouth daily.   clopidogrel 75 MG tablet Commonly known as: PLAVIX Take 75 mg by mouth daily.   diclofenac Sodium 1 % Gel Commonly known as: VOLTAREN Apply topically.   dicyclomine 20 MG tablet Commonly known as: BENTYL Take 20 mg by mouth 4 (four) times daily as needed  for spasms.   ferrous sulfate 325 (65 FE) MG tablet Take 325 mg by mouth daily with breakfast.   HumaLOG Mix 75/25 KwikPen (75-25) 100 UNIT/ML Kwikpen Generic drug: Insulin Lispro Prot & Lispro Inject 24 Units into the skin daily with breakfast AND 22 Units daily with supper.   icosapent Ethyl 1 g capsule Commonly known as: VASCEPA TAKE 2 CAPSULES(2 GRAMS) BY MOUTH TWICE DAILY   lisinopril-hydrochlorothiazide 20-12.5 MG tablet Commonly known as: ZESTORETIC Take 1 tablet by mouth daily.   lubiprostone 24 MCG capsule Commonly known as: AMITIZA Take 24 mcg by mouth 2 (two) times daily with a meal.   Olopatadine HCl 0.2 % Soln Apply 1 drop to eye daily.   omeprazole 40 MG capsule Commonly known as: PRILOSEC TAKE 1 CAPSULE(40 MG) BY MOUTH DAILY   oxyCODONE-acetaminophen 10-325 MG tablet Commonly known as: PERCOCET Take 1 tablet by mouth every 4 (four) hours as needed.   predniSONE 20 MG tablet Commonly known as: DELTASONE Take 20 mg by mouth daily with breakfast.   pregabalin 200 MG capsule Commonly known as: LYRICA Take 200 mg by mouth 3 (three) times daily.   promethazine 25 MG tablet Commonly known as: PHENERGAN Take 25 mg by mouth every 6 (six) hours as needed for nausea or vomiting.   rosuvastatin 10 MG tablet Commonly known as:  CRESTOR Take 10 mg by mouth at bedtime.   Spiriva Respimat 2.5 MCG/ACT Aers Generic drug: Tiotropium Bromide Monohydrate INHALE 2 PUFFS INTO THE LUNGS DAILY   Vitamin D (Ergocalciferol) 1.25 MG (50000 UNIT) Caps capsule Commonly known as: DRISDOL Take 50,000 Units by mouth every Tuesday.        OBJECTIVE:   Vital Signs: There were no vitals taken for this visit.  Wt Readings from Last 3 Encounters:  06/07/20 226 lb (102.5 kg)  02/15/20 219 lb (99.3 kg)  12/07/19 (!) 235 lb (106.6 kg)     Exam: General: Pt appears well and is in NAD  Lungs: Clear with good BS bilat with no rales, rhonchi, or wheezes  Heart: RRR with normal S1 and S2 and no gallops; no murmurs; no rub  Abdomen: Normoactive bowel sounds, soft, nontender, without masses or organomegaly palpable  Extremities: No pretibial edema.   Skin: Normal texture and temperature to palpation.  Neuro: MS is good with appropriate affect, pt is alert and Ox3    DM foot exam: 05/03/2019  The skin of the feet is intact without sores or ulcerations. The pedal pulses are 2+ on right and 2+ on left. The sensation is intact to a screening 5.07, 10 gram monofilament bilaterally    DATA REVIEWED:  Lab Results  Component Value Date   HGBA1C 6.8 (A) 12/07/2019   HGBA1C 6.9 (A) 08/03/2019   HGBA1C 7.4 (A) 05/03/2019   Lab Results  Component Value Date   MICROALBUR 0.7 05/03/2019   CREATININE 0.96 05/03/2019   Lab Results  Component Value Date   MICRALBCREAT 0.6 05/03/2019   Results for MYSTICAL, STORZ (MRN [id number]) as of 12/08/2019 12:36  Ref. Range 12/07/2019 11:10  Sodium Latest Ref Range: 135 - 145 mEq/L 137  Potassium Latest Ref Range: 3.5 - 5.1 mEq/L 4.0  Chloride Latest Ref Range: 96 - 112 mEq/L 101  CO2 Latest Ref Range: 19 - 32 mEq/L 32  Glucose Latest Ref Range: 70 - 99 mg/dL 199 (H)  BUN Latest Ref Range: 6 - 23 mg/dL 18  Creatinine Latest Ref Range: 0.40 - 1.20 mg/dL 1.34 (H)  Calcium Latest Ref  Range: 8.4 - 10.5 mg/dL 9.0  GFR Latest Ref Range: >60.00 mL/min 47.65 (L)  Total CHOL/HDL Ratio Unknown 8  Cholesterol Latest Ref Range: 0 - 200 mg/dL 157  HDL Cholesterol Latest Ref Range: >39.00 mg/dL 20.60 (L)  Direct LDL Latest Units: mg/dL 69.0  Triglycerides Latest Ref Range: 0 - 149 mg/dL 446.0 Triglyceride is over 400; calculations on Lipids are invalid. (H)   ASSESSMENT / PLAN / RECOMMENDATIONS:   1) Type 2 Diabetes Mellitus, Optimally controlled, With neuropathic, CKD III and macro vascular complications - Most recent A1c of 6.8 %. Goal A1c < 7.0 %.    - A1c at goal, praised pt on current glucose control - No changes at this time  MEDICATIONS: - Humalog Mix  24 units with Breakfast and 22 units with supper   EDUCATION / INSTRUCTIONS:  BG monitoring instructions: Patient is instructed to check her blood sugars 2times a day, fasting and dinner.  Call [place] [facility] clinic if: BG persistently < 70 or > 300.  I reviewed the Rule of 15 for the treatment of hypoglycemia in detail with the patient. Literature supplied.   2) Diabetic complications:   Eye: Does not have known diabetic retinopathy.   Neuro/ Feet: Does have known diabetic peripheral neuropathy .   Renal: Patient does not have known baseline CKD. She   is on an ACEI/ARB at present.   3) Right Carotid Artery Calcifications on X-Ray :   - Pt on rosuvastatin , LDL at goal  - Will refer to cardiology   4) Dyslipidemia:  - LDL at goal with rosuvastatin, but her Tg is elevated , will start fish oil first, if no improvement, will consider Vascepa.   F/U in 4 months    Signed electronically by: Mack Guise, MD  [facility] Endocrinology  [facility] [address] [place], [place] [address] Phone: [phone] FAX: [phone]   CC: [name], NP [place] [address] Phone: [phone]  Fax: [phone]  Return to  Endocrinology clinic as below: Future Appointments  Date Time Provider [place]  06/13/2020 10:10 AM Rahel Carlton, Melanie Crazier, MD [facility] None  08/27/2020  9:45 AM [facility]AS 1 [facility] None  06/09/2021 10:30 AM Patwardhan, Reynold [place], MD [facility] None

## 2020-06-19 ENCOUNTER — Other Ambulatory Visit: Payer: Self-pay

## 2020-06-20 NOTE — Progress Notes (Signed)
Name: Christina Solis  Age/ Sex: 70 y.o., female   MRN/ DOB: [id number], 08-01-1950     PCP: [name], NP   Reason for Endocrinology Evaluation: Type 2 Diabetes Mellitus  Initial Endocrine Consultative Visit:  05/03/2019    PATIENT IDENTIFIER: Christina Solis is a 70 y.o. female with a past medical history of HTN, COPD, and T2DM. The patient has followed with Endocrinology clinic since 05/03/2019 for consultative assistance with management of her diabetes.  DIABETIC HISTORY:  Christina Solis was diagnosed with T2 DM in 2008 .She was on metformin that was stopped due to CKD, has been on insulin since 2009. Her hemoglobin A1c has ranged from 6.9% in 2015, peaking at 7.4% in 2020.  On her initial visit to our clinic she had an A1c of 7.4%, she was on Humalog mix, which was continued and adjusted.  PITUITARY TUMOR :  She was first diagnosed with pituitary adenoma in 2006 . She was put on medication for ~ 8 month but when her level normalized ,the medicine was stopped. She had galactorrhea up to 2018.     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 We continued insulin mix   Today (06/21/2020): Christina Solis is here for follow-up on diabetes management. She checks her blood sugars occasionally. The patient has not had hypoglycemic episodes since the last clinic visit.    She was evaluated by cardiology due to carotid artery calcifications in 05/2020, medical management with Plavix and statins recommended.   Developed postmenopausal bleed for ~ 5 days , following with Gyn . Had an ultrasound     [place]:  Humalog mix 24 units with breakfast and 22 units with supper     Statin: Yes ACE-I/ARB: Yes    METER DOWNLOAD SUMMARY: Date range evaluated: 1/21-06/21/2020 Average Number Tests/Day = 0.5 Overall Mean FS Glucose = 148   BG Ranges Low = 95 High = 181   Hypoglycemic Events/30 Days: BG < 50 =  0 Episodes of symptomatic severe hypoglycemia = 0    DIABETIC COMPLICATIONS: Microvascular complications:   Neuropathy  Denies: retinopathy  Last eye exam: Completed 01/2019  Macrovascular complications:   CHF, PVD  Denies: CAD, CVA   HISTORY:  Past Medical History:  Past Medical History:  Diagnosis Date  . Anemia    sickle cell trait  . Anxiety   . Arthritis    "knees" (10/12/2013)  . CHF (congestive heart failure) ([place])   . Chronic back pain   . Chronic bronchitis ([place])    "got it q yr when I lived in [place]"  . Complication of anesthesia    hard to put under  . COPD (chronic obstructive pulmonary disease) ([place])   . Dysrhythmia   . Family history of anesthesia complication    sister hard to wake up  . Family history of anesthesia complication    niece have itching  . Fibromyalgia   . GERD (gastroesophageal reflux disease)   . Hyperlipidemia   . Hypertension   . Irregular heart beat   . Migraine    "used to get them alot; not regular anymore" (10/12/2013)  . PAD (peripheral artery disease) ([place])   . Pituitary tumor    followed at [facility]; on cabergoline 09/2013  . Pneumonia    "twice"  . Type II diabetes mellitus ([place])    Past Surgical History:  Past Surgical History:  Procedure  Laterality Date  . ABDOMINAL AORTAGRAM N/A 06/28/2012   Procedure: ABDOMINAL Maxcine Ham;  Surgeon: Laverda Page, MD;  Location: [facility] CATH LAB;  Service: Cardiovascular;  Laterality: N/A;  . APPENDECTOMY    . BACK SURGERY     x 2, lower back  . BILATERAL CARPAL TUNNEL RELEASE    . CARDIAC CATHETERIZATION    . CESAREAN SECTION  1974; 1976; 1978  . CHOLECYSTECTOMY N/A 10/12/2013   Procedure: LAPAROSCOPIC CHOLECYSTECTOMY WITH INTRAOPERATIVE CHOLANGIOGRAM;  Surgeon: [name]. Georgette Dover, MD;  Location: [facility];  Service: General;  Laterality: N/A;  . COLONOSCOPY    . FEMORAL ARTERY STENT Left 06/28/2012   SFA  . LAPAROSCOPIC CHOLECYSTECTOMY  10/12/2013  . LEFT HEART  CATHETERIZATION WITH CORONARY ANGIOGRAM N/A 06/28/2012   Procedure: LEFT HEART CATHETERIZATION WITH CORONARY ANGIOGRAM;  Surgeon: Laverda Page, MD;  Location: [facility] CATH LAB;  Service: Cardiovascular;  Laterality: N/A;  . LOWER EXTREMITY ANGIOGRAM N/A 06/28/2012   Procedure: LOWER EXTREMITY ANGIOGRAM;  Surgeon: Laverda Page, MD;  Location: [facility] CATH LAB;  Service: Cardiovascular;  Laterality: N/A;  . PERIPHERAL VASCULAR CATHETERIZATION N/A 09/10/2015   Procedure: Abdominal Aortogram w/Lower Extremity;  Surgeon: Adrian Prows, MD;  Location: [facility] CV LAB;  Service: Cardiovascular;  Laterality: N/A;  . POSTERIOR LUMBAR FUSION    . REPLACEMENT TOTAL KNEE Left   . TONSILLECTOMY    . TUBAL LIGATION  1978    Social History:  reports that she quit smoking about 4 years ago. Her smoking use included cigarettes. She started smoking about 20 years ago. She has a 23.00 pack-year smoking history. She has never used smokeless tobacco. She reports that she does not drink alcohol and does not use drugs. Family History:  Family History  Problem Relation Age of Onset  . Diabetes Mother   . Kidney disease Mother   . Cancer Father   . Pancreatic cancer Father   . Kidney disease Brother   . Cancer Brother   . Colon cancer Neg Hx   . Inflammatory bowel disease Neg Hx   . Esophageal cancer Neg Hx   . Liver disease Neg Hx   . Rectal cancer Neg Hx   . Stomach cancer Neg Hx      HOME MEDICATIONS: Allergies as of 06/21/2020      Reactions   Coconut Oil Hives      Medication List       Accurate as of June 21, 2020 10:33 AM. If you have any questions, ask your nurse or doctor.        albuterol 108 (90 Base) MCG/ACT inhaler Commonly known as: VENTOLIN HFA Inhale 1 puff into the lungs every 6 (six) hours as needed for wheezing or shortness of breath. ProAir   albuterol (2.5 MG/3ML) 0.083% nebulizer solution Commonly known as: PROVENTIL Take 2.5 mg by nebulization every 6 (six) hours as  needed for wheezing or shortness of breath.   benzonatate 200 MG capsule Commonly known as: TESSALON Take by mouth.   budesonide-formoterol 160-4.5 MCG/ACT inhaler Commonly known as: SYMBICORT Inhale 2 puffs into the lungs 2 (two) times daily.   cetirizine-pseudoephedrine 5-120 MG tablet Commonly known as: ZYRTEC-D Take 1 tablet by mouth daily.   clopidogrel 75 MG tablet Commonly known as: PLAVIX Take 75 mg by mouth daily.   diclofenac Sodium 1 % Gel Commonly known as: VOLTAREN Apply topically.   dicyclomine 20 MG tablet Commonly known as: BENTYL Take 20 mg by mouth 4 (four) times daily as needed  for spasms.   ferrous sulfate 325 (65 FE) MG tablet Take 325 mg by mouth daily with breakfast.   HumaLOG Mix 75/25 KwikPen (75-25) 100 UNIT/ML Kwikpen Generic drug: Insulin Lispro Prot & Lispro Inject 24 Units into the skin daily with breakfast AND 22 Units daily with supper.   icosapent Ethyl 1 g capsule Commonly known as: VASCEPA TAKE 2 CAPSULES(2 GRAMS) BY MOUTH TWICE DAILY   lisinopril-hydrochlorothiazide 20-12.5 MG tablet Commonly known as: ZESTORETIC Take 1 tablet by mouth daily.   lubiprostone 24 MCG capsule Commonly known as: AMITIZA Take 24 mcg by mouth 2 (two) times daily with a meal.   Olopatadine HCl 0.2 % Soln Apply 1 drop to eye daily.   omeprazole 40 MG capsule Commonly known as: PRILOSEC TAKE 1 CAPSULE(40 MG) BY MOUTH DAILY   oxyCODONE-acetaminophen 10-325 MG tablet Commonly known as: PERCOCET Take 1 tablet by mouth every 4 (four) hours as needed.   predniSONE 20 MG tablet Commonly known as: DELTASONE Take 20 mg by mouth daily with breakfast.   pregabalin 200 MG capsule Commonly known as: LYRICA Take 200 mg by mouth 3 (three) times daily.   promethazine 25 MG tablet Commonly known as: PHENERGAN Take 25 mg by mouth every 6 (six) hours as needed for nausea or vomiting.   rosuvastatin 10 MG tablet Commonly known as: CRESTOR Take 10 mg by  mouth at bedtime.   Spiriva Respimat 2.5 MCG/ACT Aers Generic drug: Tiotropium Bromide Monohydrate INHALE 2 PUFFS INTO THE LUNGS DAILY   Vitamin D (Ergocalciferol) 1.25 MG (50000 UNIT) Caps capsule Commonly known as: DRISDOL Take 50,000 Units by mouth every Tuesday.        OBJECTIVE:   Vital Signs: BP 110/72   Pulse 88   Ht 5\' 2"  (1.575 m)   Wt 228 lb 8 oz (103.6 kg)   SpO2 98%   BMI 41.79 kg/m   Wt Readings from Last 3 Encounters:  06/21/20 228 lb 8 oz (103.6 kg)  06/07/20 226 lb (102.5 kg)  02/15/20 219 lb (99.3 kg)     Exam: General: Pt appears well and is in NAD  Lungs: Clear with good BS bilat with no rales, rhonchi, or wheezes  Heart: RRR with normal S1 and S2 and no gallops; no murmurs; no rub  Abdomen: Normoactive bowel sounds, soft, nontender, without masses or organomegaly palpable  Extremities: No pretibial edema.   Skin: Normal texture and temperature to palpation.  Neuro: MS is good with appropriate affect, pt is alert and Ox3    DM foot exam: 05/03/2019  The skin of the feet is intact without sores or ulcerations. The pedal pulses are 2+ on right and 2+ on left. The sensation is intact to a screening 5.07, 10 gram monofilament bilaterally    DATA REVIEWED:  Lab Results  Component Value Date   HGBA1C 6.8 (A) 12/07/2019   HGBA1C 6.9 (A) 08/03/2019   HGBA1C 7.4 (A) 05/03/2019    Results for WELMA, MCCOMBS (MRN [id number]) as of 06/24/2020 07:36  Ref. Range 06/21/2020 10:54  Sodium Latest Ref Range: 135 - 145 mEq/L 140  Potassium Latest Ref Range: 3.5 - 5.1 mEq/L 4.4  Chloride Latest Ref Range: 96 - 112 mEq/L 105  CO2 Latest Ref Range: 19 - 32 mEq/L 29  Glucose Latest Ref Range: 70 - 99 mg/dL 89  BUN Latest Ref Range: 6 - 23 mg/dL 20  Creatinine Latest Ref Range: 0.40 - 1.20 mg/dL 1.14  Calcium Latest Ref Range: 8.4 - 10.5  mg/dL 9.3  GFR Latest Ref Range: >60.00 mL/min 49.60 (L)  Total CHOL/HDL Ratio Unknown 4  Cholesterol Latest Ref Range:  0 - 200 mg/dL 147  HDL Cholesterol Latest Ref Range: >39.00 mg/dL 39.50  LDL (calc) Latest Ref Range: 0 - 99 mg/dL 79  MICROALB/CREAT RATIO Latest Ref Range: 0.0 - 30.0 mg/g 0.5  NonHDL Unknown 107.53  Triglycerides Latest Ref Range: 0.0 - 149.0 mg/dL 143.0  VLDL Latest Ref Range: 0.0 - 40.0 mg/dL 28.6  FSH Latest Units: mIU/ML 38.3  Prolactin Latest Units: ng/mL 15.0  TSH Latest Ref Range: 0.35 - 4.50 uIU/mL 3.57  T4,Free(Direct) Latest Ref Range: 0.60 - 1.60 ng/dL 0.59 (L)  Creatinine,U Latest Units: mg/dL 131.3  Microalb, Ur Latest Ref Range: 0.0 - 1.9 mg/dL <0.7     ASSESSMENT / PLAN / RECOMMENDATIONS:   1) Type 2 Diabetes Mellitus, Optimally controlled, With neuropathic, CKD III and macro vascular complications - Most recent A1c of 6.8 %. Goal A1c < 7.0 %.    - A1c at goal, praised pt on current glucose control - Limited glycemic agents choice due to CKD III - No changes at this time  MEDICATIONS: - Humalog Mix  24 units with Breakfast and 22 units with supper   EDUCATION / INSTRUCTIONS:  BG monitoring instructions: Patient is instructed to check her blood sugars 2times a day, fasting and dinner.  Call [facility] if: BG persistently < 70 or > 300. . I reviewed the Rule of 15 for the treatment of hypoglycemia in detail with the patient. Literature supplied.   2) Diabetic complications:   Eye: Does not have known diabetic retinopathy.   Neuro/ Feet: Does have known diabetic peripheral neuropathy .   Renal: Patient does not have known baseline CKD. She   is on an ACEI/ARB at present.     3) Dyslipidemia:  - LDL at goal with rosuvastatin, but her Tg was elevated up to 446 mg/dL in 11/2019, but this normalized with fish oil    Medications Fish oil 1200 2 caps BID  Rosuvastatin 10 mg daily   4) Pituitary Adenoma :  - Pt tells me she has a hx of pituitary adenoma in 2006. NO prior sx . 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 - Will proceed with pituitary MRI     5) Low Free T4 :  - IN the setting of normal TSH, will monitor this for now, as this could be an essay interference vs secondary hypothyroidism       F/U in 4 months    Signed electronically by: Mack Guise, MD  [facility]  [facility] [place], [place] [address] Phone: [phone] FAX: [phone]   CC: [name], NP [place] [address] Phone: [phone]  Fax: [phone]  Return to Endocrinology clinic as below: Future Appointments  Date Time Provider [place]  06/21/2020 10:50 AM Djuana Littleton, Melanie Crazier, MD [facility] None  08/27/2020  9:45 AM [facility] 1 [facility] None  06/09/2021 10:30 AM Patwardhan, Reynold Bowen, MD [facility] None

## 2020-06-21 ENCOUNTER — Ambulatory Visit (INDEPENDENT_AMBULATORY_CARE_PROVIDER_SITE_OTHER): Payer: Medicare Other | Admitting: Internal Medicine

## 2020-06-21 ENCOUNTER — Other Ambulatory Visit: Payer: Self-pay

## 2020-06-21 ENCOUNTER — Encounter: Payer: Self-pay | Admitting: Internal Medicine

## 2020-06-21 VITALS — BP 110/72 | HR 88 | Ht 62.0 in | Wt 228.5 lb

## 2020-06-21 DIAGNOSIS — N1831 Chronic kidney disease, stage 3a: Secondary | ICD-10-CM

## 2020-06-21 DIAGNOSIS — E1122 Type 2 diabetes mellitus with diabetic chronic kidney disease: Secondary | ICD-10-CM

## 2020-06-21 DIAGNOSIS — E1165 Type 2 diabetes mellitus with hyperglycemia: Secondary | ICD-10-CM

## 2020-06-21 DIAGNOSIS — E118 Type 2 diabetes mellitus with unspecified complications: Secondary | ICD-10-CM

## 2020-06-21 DIAGNOSIS — E1159 Type 2 diabetes mellitus with other circulatory complications: Secondary | ICD-10-CM | POA: Diagnosis not present

## 2020-06-21 DIAGNOSIS — E782 Mixed hyperlipidemia: Secondary | ICD-10-CM

## 2020-06-21 DIAGNOSIS — IMO0002 Reserved for concepts with insufficient information to code with codable children: Secondary | ICD-10-CM

## 2020-06-21 DIAGNOSIS — E1142 Type 2 diabetes mellitus with diabetic polyneuropathy: Secondary | ICD-10-CM | POA: Diagnosis not present

## 2020-06-21 DIAGNOSIS — Z794 Long term (current) use of insulin: Secondary | ICD-10-CM | POA: Diagnosis not present

## 2020-06-21 DIAGNOSIS — D352 Benign neoplasm of pituitary gland: Secondary | ICD-10-CM | POA: Diagnosis not present

## 2020-06-21 LAB — T4, FREE: Free T4: 0.59 ng/dL — ABNORMAL LOW (ref 0.60–1.60)

## 2020-06-21 LAB — LIPID PANEL
Cholesterol: 147 mg/dL (ref 0–200)
HDL: 39.5 mg/dL (ref >39.00)
LDL Cholesterol: 79 mg/dL (ref 0–99)
NonHDL: 107.53
Total CHOL/HDL Ratio: 4
Triglycerides: 143 mg/dL (ref 0.0–149.0)
VLDL: 28.6 mg/dL (ref 0.0–40.0)

## 2020-06-21 LAB — MICROALBUMIN / CREATININE URINE RATIO
Creatinine,U: 131.3 mg/dL
Microalb Creat Ratio: 0.5 mg/g (ref 0.0–30.0)
Microalb, Ur: <0.7 mg/dL (ref 0.0–1.9)

## 2020-06-21 LAB — BASIC METABOLIC PANEL
BUN: 20 mg/dL (ref 6–23)
CO2: 29 mEq/L (ref 19–32)
Calcium: 9.3 mg/dL (ref 8.4–10.5)
Chloride: 105 mEq/L (ref 96–112)
Creatinine, Ser: 1.14 mg/dL (ref 0.40–1.20)
GFR: 49.6 mL/min — ABNORMAL LOW (ref >60.00)
Glucose, Bld: 89 mg/dL (ref 70–99)
Potassium: 4.4 mEq/L (ref 3.5–5.1)
Sodium: 140 mEq/L (ref 135–145)

## 2020-06-21 LAB — TSH: TSH: 3.57 u[IU]/mL (ref 0.35–4.50)

## 2020-06-21 LAB — POCT GLYCOSYLATED HEMOGLOBIN (HGB A1C): Hemoglobin A1C: 7 % — AB (ref 4.0–5.6)

## 2020-06-21 LAB — FOLLICLE STIMULATING HORMONE: FSH: 38.3 m[IU]/mL

## 2020-06-21 NOTE — Patient Instructions (Signed)
-   Humalog Mix  26 units with Breakfast and 22 units with supper     HOW TO TREAT LOW BLOOD SUGARS (Blood sugar LESS THAN 70 MG/DL)  Please follow the RULE OF 15 for the treatment of hypoglycemia treatment (when your (blood sugars are less than 70 mg/dL)    STEP 1: Take 15 grams of carbohydrates when your blood sugar is low, which includes:   3-4 GLUCOSE TABS  OR  3-4 OZ OF JUICE OR REGULAR SODA OR  ONE TUBE OF GLUCOSE GEL     STEP 2: RECHECK blood sugar in 15 MINUTES STEP 3: If your blood sugar is still low at the 15 minute recheck --> then, go back to STEP 1 and treat AGAIN with another 15 grams of carbohydrates.

## 2020-06-24 ENCOUNTER — Encounter: Payer: Self-pay | Admitting: Internal Medicine

## 2020-06-24 DIAGNOSIS — N1831 Chronic kidney disease, stage 3a: Secondary | ICD-10-CM | POA: Insufficient documentation

## 2020-06-24 DIAGNOSIS — E782 Mixed hyperlipidemia: Secondary | ICD-10-CM | POA: Insufficient documentation

## 2020-06-24 DIAGNOSIS — Z794 Long term (current) use of insulin: Secondary | ICD-10-CM | POA: Insufficient documentation

## 2020-06-25 LAB — PROLACTIN: Prolactin: 15 ng/mL

## 2020-06-25 LAB — INSULIN-LIKE GROWTH FACTOR
IGF-I, LC/MS: 73 ng/mL (ref 41–279)
Z-Score (Female): -0.8 SD (ref ?–2.0)

## 2020-07-14 ENCOUNTER — Ambulatory Visit
Admission: RE | Admit: 2020-07-14 | Discharge: 2020-07-14 | Disposition: A | Discharge status: Home / Self Care | Payer: Medicare Other | Source: Ambulatory Visit | Attending: Internal Medicine | Admitting: Internal Medicine

## 2020-07-14 DIAGNOSIS — D352 Benign neoplasm of pituitary gland: Secondary | ICD-10-CM

## 2020-07-14 MED ORDER — GADOBENATE DIMEGLUMINE 529 MG/ML IV SOLN
10.0000 mL | Freq: Once | INTRAVENOUS | Status: AC | PRN
  Administered 2020-07-14: 10 mL via INTRAVENOUS

## 2020-07-17 ENCOUNTER — Encounter: Payer: Self-pay | Admitting: Internal Medicine

## 2020-07-25 ENCOUNTER — Other Ambulatory Visit: Payer: Self-pay | Admitting: Cardiology

## 2020-07-25 DIAGNOSIS — E781 Pure hyperglyceridemia: Secondary | ICD-10-CM

## 2020-08-13 ENCOUNTER — Other Ambulatory Visit: Payer: Self-pay | Admitting: Physician Assistant

## 2020-08-13 ENCOUNTER — Ambulatory Visit
Admission: RE | Admit: 2020-08-13 | Discharge: 2020-08-13 | Disposition: A | Discharge status: Home / Self Care | Payer: Medicare Other | Source: Ambulatory Visit | Attending: Physician Assistant | Admitting: Physician Assistant

## 2020-08-13 DIAGNOSIS — M25561 Pain in right knee: Secondary | ICD-10-CM

## 2020-08-27 ENCOUNTER — Other Ambulatory Visit: Payer: Medicare Other

## 2020-09-10 ENCOUNTER — Ambulatory Visit: Payer: Medicare Other

## 2020-09-10 ENCOUNTER — Other Ambulatory Visit: Payer: Self-pay

## 2020-09-10 DIAGNOSIS — I6523 Occlusion and stenosis of bilateral carotid arteries: Secondary | ICD-10-CM

## 2020-09-11 ENCOUNTER — Other Ambulatory Visit: Payer: Self-pay | Admitting: Cardiology

## 2020-09-11 DIAGNOSIS — I6523 Occlusion and stenosis of bilateral carotid arteries: Secondary | ICD-10-CM

## 2020-09-11 NOTE — Progress Notes (Signed)
Your patient 

## 2020-09-11 NOTE — Progress Notes (Signed)
Unchanged, stable moderate carotid artery disease. Recommend repeat carotid US in 02/2021.  Thanks MJP

## 2020-09-12 NOTE — Progress Notes (Signed)
When calling pt it sound like the fax machine

## 2020-10-17 ENCOUNTER — Other Ambulatory Visit: Payer: Self-pay | Admitting: Internal Medicine

## 2020-10-18 ENCOUNTER — Encounter: Payer: Self-pay | Admitting: Internal Medicine

## 2020-10-18 ENCOUNTER — Ambulatory Visit (INDEPENDENT_AMBULATORY_CARE_PROVIDER_SITE_OTHER): Payer: Medicare Other | Admitting: Internal Medicine

## 2020-10-18 ENCOUNTER — Other Ambulatory Visit: Payer: Self-pay

## 2020-10-18 VITALS — BP 108/72 | HR 78 | Ht 62.0 in | Wt 235.0 lb

## 2020-10-18 DIAGNOSIS — E1142 Type 2 diabetes mellitus with diabetic polyneuropathy: Secondary | ICD-10-CM

## 2020-10-18 DIAGNOSIS — Z794 Long term (current) use of insulin: Secondary | ICD-10-CM | POA: Diagnosis not present

## 2020-10-18 LAB — POCT GLYCOSYLATED HEMOGLOBIN (HGB A1C): Hemoglobin A1C: 6.7 % — AB (ref 4.0–5.6)

## 2020-10-18 MED ORDER — HUMALOG MIX 75/25 KWIKPEN (75-25) 100 UNIT/ML ~~LOC~~ SUPN: PEN_INJECTOR | SUBCUTANEOUS | 3 refills | Status: DC

## 2020-10-18 MED ORDER — INSULIN PEN NEEDLE 32G X 4 MM MISC: 1.0000 | Freq: Two times a day (BID) | 3 refills | Status: DC

## 2020-10-18 MED ORDER — DAPAGLIFLOZIN PROPANEDIOL 5 MG PO TABS: 5.0000 mg | ORAL_TABLET | Freq: Every day | ORAL | 6 refills | Status: DC

## 2020-10-18 NOTE — Patient Instructions (Addendum)
-   Change Humalog Mix  24 units with Breakfast and 20 units with supper - Start Farxiga 5 mg, 1 tablet every morning     HOW TO TREAT LOW BLOOD SUGARS (Blood sugar LESS THAN 70 MG/DL)  Please follow the RULE OF 15 for the treatment of hypoglycemia treatment (when your (blood sugars are less than 70 mg/dL)    STEP 1: Take 15 grams of carbohydrates when your blood sugar is low, which includes:   3-4 GLUCOSE TABS  OR  3-4 OZ OF JUICE OR REGULAR SODA OR  ONE TUBE OF GLUCOSE GEL     STEP 2: RECHECK blood sugar in 15 MINUTES STEP 3: If your blood sugar is still low at the 15 minute recheck --> then, go back to STEP 1 and treat AGAIN with another 15 grams of carbohydrates.

## 2020-10-18 NOTE — Progress Notes (Signed)
Name: Christina Solis  Age/ Sex: 70 y.o., female   MRN/ DOB: [id number], 29-Apr-1951     PCP: [name], NP   Reason for Endocrinology Evaluation: Type 2 Diabetes Mellitus  Initial Endocrine Consultative Visit:  05/03/2019    PATIENT IDENTIFIER: Christina Solis is a 70 y.o. female with a past medical history of HTN, COPD, and T2DM. The patient has followed with Endocrinology clinic since 05/03/2019 for consultative assistance with management of her diabetes.  DIABETIC HISTORY:  Christina Solis was diagnosed with T2 DM in 2008 .She was on metformin that was stopped due to CKD, has been on insulin since 2009. Her hemoglobin A1c has ranged from 6.9% in 2015, peaking at 7.4% in 2020.  On her initial visit to our clinic she had an A1c of 7.4%, she was on Humalog mix, which was continued and adjusted.  PITUITARY TUMOR :  She was first diagnosed with pituitary adenoma in 2006 . She was put on medication for ~ 8 month but when her level normalized ,the medicine was stopped. She had galactorrhea up to 2018.  Repeat prolactin in 2022 was normal with appropriately elevated FSH and normal IGF   She is a retired Marine scientist Lives with daughter SUBJECTIVE:   During the last visit (06/21/2020): A1c 6.8% . We continued insulin mix   Today (10/18/2020): Christina Solis is here for follow-up on diabetes management. She checks her blood sugars occasionally. The patient has not had hypoglycemic episodes since the last clinic visit.    She was evaluated by cardiology due to carotid artery calcifications in 05/2020, medical management with Plavix and statins recommended.   She has not been sleeping well at night has been having muscle cramps   Developed postmenopausal bleed for ~ 5 days , following with Gyn , this has resolved.   Has been getting shoulder glucocorticoid injection   HOME DIABETES REGIMEN:  Humalog mix 24 units with breakfast and 22 units with supper     Statin: Yes ACE-I/ARB:  Yes    METER DOWNLOAD SUMMARY: Date range evaluated: 5/20-10/18/2020 Average Number Tests/Day = 1.5 Overall Mean FS Glucose = 158   BG Ranges Low = 64 High = 438   Hypoglycemic Events/30 Days: BG < 50 = 0 Episodes of symptomatic severe hypoglycemia = 0    DIABETIC COMPLICATIONS: Microvascular complications:   Neuropathy, CKD III  Denies: retinopathy  Last eye exam: Completed 01/2020  Macrovascular complications:   CHF, PVD  Denies: CAD, CVA   HISTORY:  Past Medical History:  Past Medical History:  Diagnosis Date  . Anemia    sickle cell trait  . Anxiety   . Arthritis    "knees" (10/12/2013)  . CHF (congestive heart failure) ([place])   . Chronic back pain   . Chronic bronchitis ([place])    "got it q yr when I lived in [place]"  . Complication of anesthesia    hard to put under  . COPD (chronic obstructive pulmonary disease) ([place])   . Dysrhythmia   . Family history of anesthesia complication    sister hard to wake up  . Family history of anesthesia complication    niece have itching  . Fibromyalgia   . GERD (gastroesophageal reflux disease)   . Hyperlipidemia   . Hypertension   . Irregular heart beat   . Migraine    "used to get them alot; not regular anymore" (10/12/2013)  . PAD (peripheral artery disease) ([place])   . Pituitary tumor  [facility]; on cabergoline 09/2013  . Pneumonia    "twice"  . Type II diabetes mellitus ([place])    Past Surgical History:  Past Surgical History:  Procedure Laterality Date  . ABDOMINAL AORTAGRAM N/A 06/28/2012   Procedure: ABDOMINAL Maxcine Ham;  Surgeon: Laverda Page, MD;  Location: [facility] CATH LAB;  Service: Cardiovascular;  Laterality: N/A;  . APPENDECTOMY    . BACK SURGERY     x 2, lower back  . BILATERAL CARPAL TUNNEL RELEASE    . CARDIAC CATHETERIZATION    . CESAREAN SECTION  1974; 1976; 1978  . CHOLECYSTECTOMY N/A 10/12/2013   Procedure: LAPAROSCOPIC CHOLECYSTECTOMY WITH INTRAOPERATIVE  CHOLANGIOGRAM;  Surgeon: [name]. Georgette Dover, MD;  Location: [facility];  Service: General;  Laterality: N/A;  . COLONOSCOPY    . FEMORAL ARTERY STENT Left 06/28/2012   SFA  . LAPAROSCOPIC CHOLECYSTECTOMY  10/12/2013  . LEFT HEART CATHETERIZATION WITH CORONARY ANGIOGRAM N/A 06/28/2012   Procedure: LEFT HEART CATHETERIZATION WITH CORONARY ANGIOGRAM;  Surgeon: Laverda Page, MD;  Location: [facility] CATH LAB;  Service: Cardiovascular;  Laterality: N/A;  . LOWER EXTREMITY ANGIOGRAM N/A 06/28/2012   Procedure: LOWER EXTREMITY ANGIOGRAM;  Surgeon: Laverda Page, MD;  Location: [facility] CATH LAB;  Service: Cardiovascular;  Laterality: N/A;  . PERIPHERAL VASCULAR CATHETERIZATION N/A 09/10/2015   Procedure: Abdominal Aortogram w/Lower Extremity;  Surgeon: Adrian Prows, MD;  Location: [facility] CV LAB;  Service: Cardiovascular;  Laterality: N/A;  . POSTERIOR LUMBAR FUSION    . REPLACEMENT TOTAL KNEE Left   . TONSILLECTOMY    . TUBAL LIGATION  1978    Social History:  reports that she quit smoking about 5 years ago. Her smoking use included cigarettes. She started smoking about 20 years ago. She has a 23.00 pack-year smoking history. She has never used smokeless tobacco. She reports that she does not drink alcohol and does not use drugs. Family History:  Family History  Problem Relation Age of Onset  . Diabetes Mother   . Kidney disease Mother   . Cancer Father   . Pancreatic cancer Father   . Kidney disease Brother   . Cancer Brother   . Colon cancer Neg Hx   . Inflammatory bowel disease Neg Hx   . Esophageal cancer Neg Hx   . Liver disease Neg Hx   . Rectal cancer Neg Hx   . Stomach cancer Neg Hx      HOME MEDICATIONS: Allergies as of 10/18/2020      Reactions   Coconut Oil Hives      Medication List       Accurate as of October 18, 2020  1:15 PM. If you have any questions, ask your nurse or doctor.        albuterol 108 (90 Base) MCG/ACT inhaler Commonly known as: VENTOLIN HFA Inhale 1 puff into  the lungs every 6 (six) hours as needed for wheezing or shortness of breath. ProAir   albuterol (2.5 MG/3ML) 0.083% nebulizer solution Commonly known as: PROVENTIL Take 2.5 mg by nebulization every 6 (six) hours as needed for wheezing or shortness of breath.   benzonatate 200 MG capsule Commonly known as: TESSALON Take by mouth.   budesonide-formoterol 160-4.5 MCG/ACT inhaler Commonly known as: SYMBICORT Inhale 2 puffs into the lungs 2 (two) times daily.   cetirizine-pseudoephedrine 5-120 MG tablet Commonly known as: ZYRTEC-D Take 1 tablet by mouth daily.   clopidogrel 75 MG tablet Commonly known as: PLAVIX Take 75 mg by mouth daily.  diclofenac Sodium 1 % Gel Commonly known as: VOLTAREN Apply topically.   dicyclomine 20 MG tablet Commonly known as: BENTYL Take 20 mg by mouth 4 (four) times daily as needed for spasms.   ferrous sulfate 325 (65 FE) MG tablet Take 325 mg by mouth daily with breakfast.   HumaLOG Mix 75/25 KwikPen (75-25) 100 UNIT/ML Kwikpen Generic drug: Insulin Lispro Prot & Lispro Inject 24 Units into the skin daily with breakfast AND 22 Units daily with supper.   icosapent Ethyl 1 g capsule Commonly known as: VASCEPA TAKE 2 CAPSULES(2 GRAMS) BY MOUTH TWICE DAILY   lisinopril-hydrochlorothiazide 20-12.5 MG tablet Commonly known as: ZESTORETIC Take 1 tablet by mouth daily.   lubiprostone 24 MCG capsule Commonly known as: AMITIZA Take 24 mcg by mouth 2 (two) times daily with a meal.   Olopatadine HCl 0.2 % Soln Apply 1 drop to eye daily.   omeprazole 40 MG capsule Commonly known as: PRILOSEC TAKE 1 CAPSULE(40 MG) BY MOUTH DAILY   oxyCODONE-acetaminophen 10-325 MG tablet Commonly known as: PERCOCET Take 1 tablet by mouth every 4 (four) hours as needed.   predniSONE 20 MG tablet Commonly known as: DELTASONE Take 20 mg by mouth daily with breakfast.   pregabalin 200 MG capsule Commonly known as: LYRICA Take 200 mg by mouth 3 (three) times  daily.   promethazine 25 MG tablet Commonly known as: PHENERGAN Take 25 mg by mouth every 6 (six) hours as needed for nausea or vomiting.   rosuvastatin 10 MG tablet Commonly known as: CRESTOR Take 10 mg by mouth at bedtime.   Spiriva Respimat 2.5 MCG/ACT Aers Generic drug: Tiotropium Bromide Monohydrate INHALE 2 PUFFS INTO THE LUNGS DAILY   Vitamin D (Ergocalciferol) 1.25 MG (50000 UNIT) Caps capsule Commonly known as: DRISDOL Take 50,000 Units by mouth every Tuesday.        OBJECTIVE:   Vital Signs: BP 108/72   Pulse 78   Ht 5\' 2"  (1.575 m)   Wt 235 lb (106.6 kg)   SpO2 98%   BMI 42.98 kg/m   Wt Readings from Last 3 Encounters:  10/18/20 235 lb (106.6 kg)  06/21/20 228 lb 8 oz (103.6 kg)  06/07/20 226 lb (102.5 kg)     Exam: General: Pt appears well and is in NAD  Lungs: Clear with good BS bilat with no rales, rhonchi, or wheezes  Heart: RRR with normal S1 and S2 and no gallops; no murmurs; no rub  Abdomen: Normoactive bowel sounds, soft, nontender, without masses or organomegaly palpable  Extremities: No pretibial edema.   Skin: Normal texture and temperature to palpation.  Neuro: MS is good with appropriate affect, pt is alert and Ox3    DM foot exam: 10/18/2020  The skin of the feet is intact without sores or ulcerations. The pedal pulses are 1+ on right and 1+ on left. The sensation is intact to a screening 5.07, 10 gram monofilament bilaterally    DATA REVIEWED:  Lab Results  Component Value Date   HGBA1C 6.7 (A) 10/18/2020   HGBA1C 7.0 (A) 06/21/2020   HGBA1C 6.8 (A) 12/07/2019    Results for HAZELYNN, MCKENNY (MRN [id number]) as of 06/24/2020 07:36  Ref. Range 06/21/2020 10:54  Sodium Latest Ref Range: 135 - 145 mEq/L 140  Potassium Latest Ref Range: 3.5 - 5.1 mEq/L 4.4  Chloride Latest Ref Range: 96 - 112 mEq/L 105  CO2 Latest Ref Range: 19 - 32 mEq/L 29  Glucose Latest Ref Range: 70 - 99  mg/dL 89  BUN Latest Ref Range: 6 - 23 mg/dL 20   Creatinine Latest Ref Range: 0.40 - 1.20 mg/dL 1.14  Calcium Latest Ref Range: 8.4 - 10.5 mg/dL 9.3  GFR Latest Ref Range: >60.00 mL/min 49.60 (L)  Total CHOL/HDL Ratio Unknown 4  Cholesterol Latest Ref Range: 0 - 200 mg/dL 147  HDL Cholesterol Latest Ref Range: >39.00 mg/dL 39.50  LDL (calc) Latest Ref Range: 0 - 99 mg/dL 79  MICROALB/CREAT RATIO Latest Ref Range: 0.0 - 30.0 mg/g 0.5  NonHDL Unknown 107.53  Triglycerides Latest Ref Range: 0.0 - 149.0 mg/dL 143.0  VLDL Latest Ref Range: 0.0 - 40.0 mg/dL 28.6  FSH Latest Units: mIU/ML 38.3  Prolactin Latest Units: ng/mL 15.0  TSH Latest Ref Range: 0.35 - 4.50 uIU/mL 3.57  T4,Free(Direct) Latest Ref Range: 0.60 - 1.60 ng/dL 0.59 (L)  Creatinine,U Latest Units: mg/dL 131.3  Microalb, Ur Latest Ref Range: 0.0 - 1.9 mg/dL <0.7     ASSESSMENT / PLAN / RECOMMENDATIONS:   1) Type 2 Diabetes Mellitus, Optimally controlled, With neuropathic, CKD III and macro vascular complications - Most recent A1c of 6.7 %. Goal A1c < 7.0 %.    - A1c at goal, praised pt on current glucose control - We discussed adding SGLT-2 inhibitors,  Cautioned against Genital infection.  - She has been noted with overnight hypoglycemia, will reduce night time insulin dose   MEDICATIONS: -Continue  Humalog Mix  24 units with Breakfast and decrease to 20 units with supper - Start Farxiga 5 mg  Daily   EDUCATION / INSTRUCTIONS:  BG monitoring instructions: Patient is instructed to check her blood sugars 2 times a day, fasting and dinner.  Call [facility] if: BG persistently < 70  . I reviewed the Rule of 15 for the treatment of hypoglycemia in detail with the patient. Literature supplied.   2) Diabetic complications:   Eye: Does not have known diabetic retinopathy.   Neuro/ Feet: Does have known diabetic peripheral neuropathy .   Renal: Patient does have known baseline CKD. She   is on an ACEI/ARB at present.     3) Dyslipidemia:  -  LDL at goal with rosuvastatin, but her Tg was elevated up to 446 mg/dL in 11/2019, but this normalized with fish oil    Medications Fish oil 1200 2 caps BID  Rosuvastatin 10 mg daily   4) Pituitary Microadenoma :  - Pt tells me she has a hx of pituitary adenoma in 2006. NO prior sx . 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 - Pituitary hormone check was normal 06/2020 - MRI showed 4/2 mm pituitary adenoma (06/2020)       F/U in 4 months    Signed electronically by: Mack Guise, MD  [facility]  [facility] [address], [place] [address] Phone: [phone] FAX: [phone]   CC: [name], NP [place] [address] Phone: [phone]  Fax: [phone]  Return to Endocrinology clinic as below: Future Appointments  Date Time Provider [place]  06/09/2021 10:30 AM Patwardhan, Reynold Bowen, MD [facility] None

## 2021-02-04 ENCOUNTER — Ambulatory Visit: Payer: Medicare Other

## 2021-02-04 ENCOUNTER — Other Ambulatory Visit: Payer: Self-pay

## 2021-02-04 DIAGNOSIS — I6523 Occlusion and stenosis of bilateral carotid arteries: Secondary | ICD-10-CM

## 2021-02-21 ENCOUNTER — Ambulatory Visit (INDEPENDENT_AMBULATORY_CARE_PROVIDER_SITE_OTHER): Payer: Medicare Other | Admitting: Internal Medicine

## 2021-02-21 ENCOUNTER — Encounter: Payer: Self-pay | Admitting: Internal Medicine

## 2021-02-21 ENCOUNTER — Other Ambulatory Visit: Payer: Self-pay

## 2021-02-21 VITALS — BP 140/80 | HR 74 | Ht 62.0 in | Wt 237.4 lb

## 2021-02-21 DIAGNOSIS — Z794 Long term (current) use of insulin: Secondary | ICD-10-CM | POA: Diagnosis not present

## 2021-02-21 DIAGNOSIS — E781 Pure hyperglyceridemia: Secondary | ICD-10-CM

## 2021-02-21 DIAGNOSIS — E782 Mixed hyperlipidemia: Secondary | ICD-10-CM

## 2021-02-21 DIAGNOSIS — D352 Benign neoplasm of pituitary gland: Secondary | ICD-10-CM

## 2021-02-21 DIAGNOSIS — E1159 Type 2 diabetes mellitus with other circulatory complications: Secondary | ICD-10-CM | POA: Diagnosis not present

## 2021-02-21 DIAGNOSIS — E1142 Type 2 diabetes mellitus with diabetic polyneuropathy: Secondary | ICD-10-CM

## 2021-02-21 LAB — BASIC METABOLIC PANEL
BUN: 24 mg/dL — ABNORMAL HIGH (ref 6–23)
CO2: 27 mEq/L (ref 19–32)
Calcium: 9.2 mg/dL (ref 8.4–10.5)
Chloride: 107 mEq/L (ref 96–112)
Creatinine, Ser: 1.15 mg/dL (ref 0.40–1.20)
GFR: 48.85 mL/min — ABNORMAL LOW (ref >60.00)
Glucose, Bld: 94 mg/dL (ref 70–99)
Potassium: 3.8 mEq/L (ref 3.5–5.1)
Sodium: 142 mEq/L (ref 135–145)

## 2021-02-21 LAB — POCT GLYCOSYLATED HEMOGLOBIN (HGB A1C): Hemoglobin A1C: 6.8 % — AB (ref 4.0–5.6)

## 2021-02-21 MED ORDER — DAPAGLIFLOZIN PROPANEDIOL 10 MG PO TABS: 10.0000 mg | ORAL_TABLET | Freq: Every day | ORAL | 3 refills | Status: DC

## 2021-02-21 MED ORDER — HUMALOG MIX 75/25 KWIKPEN (75-25) 100 UNIT/ML ~~LOC~~ SUPN: PEN_INJECTOR | SUBCUTANEOUS | 3 refills | Status: DC

## 2021-02-21 MED ORDER — ROSUVASTATIN CALCIUM 10 MG PO TABS: 10.0000 mg | ORAL_TABLET | Freq: Every day | ORAL | 3 refills | Status: DC

## 2021-02-21 NOTE — Progress Notes (Signed)
Name: Christina Solis  Age/ Sex: 70 y.o., female   MRN/ DOB: [id number], November 23, 1950     PCP: [name], NP   Reason for Endocrinology Evaluation: Type 2 Diabetes Mellitus  Initial Endocrine Consultative Visit:  05/03/2019    PATIENT IDENTIFIER: Christina Solis is a 70 y.o. female with a past medical history of HTN, COPD, and T2DM. The patient has followed with Endocrinology clinic since 05/03/2019 for consultative assistance with management of her diabetes.  DIABETIC HISTORY:  Christina Solis was diagnosed with T2 DM in 2008 .She was on metformin that was stopped due to CKD, has been on insulin since 2009. Her hemoglobin A1c has ranged from 6.9% in 2015, peaking at 7.4% in 2020.  On her initial visit to our clinic she had an A1c of 7.4%, she was on Humalog mix, which was continued and adjusted.  PITUITARY TUMOR :  She was first diagnosed with pituitary adenoma in 2006 . She was put on medication for ~ 8 month but when her level normalized ,the medicine was stopped. She had galactorrhea up to 2018.  Repeat prolactin in 2022 was normal with appropriately elevated FSH and normal IGF   She is a retired [employer] scientist Lives with daughter SUBJECTIVE:   During the last visit (06/21/2020): A1c 6.7% . We continued insulin mix  and started farxiga      Today (02/21/2021): Christina Solis is here for follow-up on diabetes management. She checks her blood sugars 1 x a day  The patient has not had hypoglycemic episodes since the last clinic visit.    She was evaluated by cardiology due to carotid artery calcifications in 05/2020, medical management with Plavix and statins recommended.    Saw Gyn for postmenopausal bleed , found to have a fibroid , pending follow up with them again  Denies galactorrhea   Weight has been stable  Recently treated with Abx for ear infection    HOME DIABETES REGIMEN:  Humalog mix 24 units with breakfast and 20 units with supper Farxiga 5 mg daily     Statin:  Yes ACE-I/ARB: Yes    METER DOWNLOAD SUMMARY: Date range evaluated: 9/23-10/11/2020 Average Number Tests/Day = 1.1 Overall Mean FS Glucose = 168   BG Ranges Low = 76 High = 357   Hypoglycemic Events/30 Days: BG < 50 = 0 Episodes of symptomatic severe hypoglycemia = 0    DIABETIC COMPLICATIONS: Microvascular complications:  Neuropathy, CKD III Denies: retinopathy Last eye exam: scheduled 10/29th, 2022   Macrovascular complications:  CHF, PVD Denies: CAD, CVA    HISTORY:  Past Medical History:  Past Medical History:  Diagnosis Date   Anemia    sickle cell trait   Anxiety    Arthritis    "knees" (10/12/2013)   CHF (congestive heart failure) ([place])    Chronic back pain    Chronic bronchitis ([place])    "got it q yr when I lived in [place]"   Complication of anesthesia    hard to put under   COPD (chronic obstructive pulmonary disease) ([place])    Dysrhythmia    Family history of anesthesia complication    sister hard to wake up   Family history of anesthesia complication    niece have itching   Fibromyalgia    GERD (gastroesophageal reflux disease)    Hyperlipidemia    Hypertension    Irregular heart beat    Migraine    "used to get them alot; not regular anymore" (10/12/2013)  PAD (peripheral artery disease) ([place])    Pituitary tumor    followed at [facility]; on cabergoline 09/2013   Pneumonia    "twice"   Type II diabetes mellitus ([place])    Past Surgical History:  Past Surgical History:  Procedure Laterality Date   ABDOMINAL AORTAGRAM N/A 06/28/2012   Procedure: ABDOMINAL Maxcine Ham;  Surgeon: Laverda Page, MD;  Location: [facility] CATH LAB;  Service: Cardiovascular;  Laterality: N/A;   APPENDECTOMY     BACK SURGERY     x 2, lower back   BILATERAL CARPAL TUNNEL RELEASE     CARDIAC CATHETERIZATION     CESAREAN SECTION  1974; 1976; [place] N/A 10/12/2013   Procedure: LAPAROSCOPIC CHOLECYSTECTOMY WITH INTRAOPERATIVE CHOLANGIOGRAM;   Surgeon: [name]. Georgette Dover, MD;  Location: [place];  Service: General;  Laterality: N/A;   COLONOSCOPY     FEMORAL ARTERY STENT Left 06/28/2012   SFA   LAPAROSCOPIC CHOLECYSTECTOMY  10/12/2013   LEFT HEART CATHETERIZATION WITH CORONARY ANGIOGRAM N/A 06/28/2012   Procedure: LEFT HEART CATHETERIZATION WITH CORONARY ANGIOGRAM;  Surgeon: Laverda Page, MD;  Location: [facility] CATH LAB;  Service: Cardiovascular;  Laterality: N/A;   LOWER EXTREMITY ANGIOGRAM N/A 06/28/2012   Procedure: LOWER EXTREMITY ANGIOGRAM;  Surgeon: Laverda Page, MD;  Location: [facility] CATH LAB;  Service: Cardiovascular;  Laterality: N/A;   PERIPHERAL VASCULAR CATHETERIZATION N/A 09/10/2015   Procedure: Abdominal Aortogram w/Lower Extremity;  Surgeon: Adrian Prows, MD;  Location: [place] CV LAB;  Service: Cardiovascular;  Laterality: N/A;   POSTERIOR LUMBAR FUSION     REPLACEMENT TOTAL KNEE Left    TONSILLECTOMY     TUBAL LIGATION  1978   Social History:  reports that she quit smoking about 5 years ago. Her smoking use included cigarettes. She started smoking about 20 years ago. She has a 23.00 pack-year smoking history. She has never used smokeless tobacco. She reports that she does not drink alcohol and does not use drugs. Family History:  Family History  Problem Relation Age of Onset   Diabetes Mother    Kidney disease Mother    Cancer Father    Pancreatic cancer Father    Kidney disease Brother    Cancer Brother    Colon cancer Neg Hx    Inflammatory bowel disease Neg Hx    Esophageal cancer Neg Hx    Liver disease Neg Hx    Rectal cancer Neg Hx    Stomach cancer Neg Hx      HOME MEDICATIONS: Allergies as of 02/21/2021       Reactions   Coconut Oil Hives        Medication List        Accurate as of February 21, 2021  3:47 PM. If you have any questions, ask your nurse or doctor.          STOP taking these medications    predniSONE 20 MG tablet Commonly known as: DELTASONE Stopped by: Dorita Sciara, MD       TAKE these medications    albuterol 108 (90 Base) MCG/ACT inhaler Commonly known as: VENTOLIN HFA Inhale 1 puff into the lungs every 6 (six) hours as needed for wheezing or shortness of breath. ProAir   albuterol (2.5 MG/3ML) 0.083% nebulizer solution Commonly known as: PROVENTIL Take 2.5 mg by nebulization every 6 (six) hours as needed for wheezing or shortness of breath.   benzonatate 200 MG capsule Commonly known as: TESSALON Take by mouth.  budesonide-formoterol 160-4.5 MCG/ACT inhaler Commonly known as: SYMBICORT Inhale 2 puffs into the lungs 2 (two) times daily.   cetirizine-pseudoephedrine 5-120 MG tablet Commonly known as: ZYRTEC-D Take 1 tablet by mouth daily.   clopidogrel 75 MG tablet Commonly known as: PLAVIX Take 75 mg by mouth daily.   dapagliflozin propanediol 10 MG Tabs tablet Commonly known as: Farxiga Take 1 tablet (10 mg total) by mouth daily before breakfast. What changed:  medication strength how much to take Changed by: Dorita Sciara, MD   diclofenac Sodium 1 % Gel Commonly known as: VOLTAREN Apply topically.   dicyclomine 20 MG tablet Commonly known as: BENTYL Take 20 mg by mouth 4 (four) times daily as needed for spasms.   ferrous sulfate 325 (65 FE) MG tablet Take 325 mg by mouth daily with breakfast.   HumaLOG Mix 75/25 KwikPen (75-25) 100 UNIT/ML Kwikpen Generic drug: Insulin Lispro Prot & Lispro Inject 22 Units into the skin daily with breakfast AND 20 Units daily with supper. What changed: See the new instructions. Changed by: Dorita Sciara, MD   icosapent Ethyl 1 g capsule Commonly known as: VASCEPA TAKE 2 CAPSULES(2 GRAMS) BY MOUTH TWICE DAILY   Insulin Pen Needle 32G X 4 MM Misc 1 Device by Does not apply route in the morning and at bedtime.   lisinopril-hydrochlorothiazide 20-12.5 MG tablet Commonly known as: ZESTORETIC Take 1 tablet by mouth daily.   lubiprostone 24 MCG  capsule Commonly known as: AMITIZA Take 24 mcg by mouth 2 (two) times daily with a meal.   Olopatadine HCl 0.2 % Soln Apply 1 drop to eye daily.   omeprazole 40 MG capsule Commonly known as: PRILOSEC TAKE 1 CAPSULE(40 MG) BY MOUTH DAILY   oxyCODONE-acetaminophen 10-325 MG tablet Commonly known as: PERCOCET Take 1 tablet by mouth every 4 (four) hours as needed.   pregabalin 200 MG capsule Commonly known as: LYRICA Take 200 mg by mouth 3 (three) times daily.   promethazine 25 MG tablet Commonly known as: PHENERGAN Take 25 mg by mouth every 6 (six) hours as needed for nausea or vomiting.   rosuvastatin 10 MG tablet Commonly known as: CRESTOR Take 10 mg by mouth at bedtime.   Spiriva Respimat 2.5 MCG/ACT Aers Generic drug: Tiotropium Bromide Monohydrate INHALE 2 PUFFS INTO THE LUNGS DAILY   Vitamin D (Ergocalciferol) 1.25 MG (50000 UNIT) Caps capsule Commonly known as: DRISDOL Take 50,000 Units by mouth every Tuesday.         OBJECTIVE:   Vital Signs: BP 140/80 (BP Location: Right Arm, Patient Position: Sitting, Cuff Size: Large)   Pulse 74   Ht 5\' 2"  (1.575 m)   Wt 237 lb 6.4 oz (107.7 kg)   SpO2 98%   BMI 43.42 kg/m   Wt Readings from Last 3 Encounters:  02/21/21 237 lb 6.4 oz (107.7 kg)  10/18/20 235 lb (106.6 kg)  06/21/20 228 lb 8 oz (103.6 kg)     Exam: General: Pt appears well and is in NAD  Lungs: Clear with good BS bilat with no rales, rhonchi, or wheezes  Heart: RRR with normal S1 and S2 and no gallops; no murmurs; no rub  Abdomen: Normoactive bowel sounds, soft, nontender, without masses or organomegaly palpable  Extremities: No pretibial edema.   Skin: Normal texture and temperature to palpation.  Neuro: MS is good with appropriate affect, pt is alert and Ox3    DM foot exam: 10/18/2020   The skin of the feet is intact without  sores or ulcerations. The pedal pulses are 1+ on right and 1+ on left. The sensation is intact to a screening 5.07,  10 gram monofilament bilaterally    DATA REVIEWED:  Lab Results  Component Value Date   HGBA1C 6.8 (A) 02/21/2021   HGBA1C 6.7 (A) 10/18/2020   HGBA1C 7.0 (A) 06/21/2020   Results for ISABELLAH, SOBOCINSKI (MRN [id number]) as of 02/24/2021 07:35  Ref. Range 02/21/2021 14:06  Sodium Latest Ref Range: 135 - 145 mEq/L 142  Potassium Latest Ref Range: 3.5 - 5.1 mEq/L 3.8  Chloride Latest Ref Range: 96 - 112 mEq/L 107  CO2 Latest Ref Range: 19 - 32 mEq/L 27  Glucose Latest Ref Range: 70 - 99 mg/dL 94  BUN Latest Ref Range: 6 - 23 mg/dL 24 (H)  Creatinine Latest Ref Range: 0.40 - 1.20 mg/dL 1.15  Calcium Latest Ref Range: 8.4 - 10.5 mg/dL 9.2  GFR Latest Ref Range: >60.00 mL/min 48.85 (L)    ASSESSMENT / PLAN / RECOMMENDATIONS:   1) Type 2 Diabetes Mellitus, Optimally controlled, With neuropathic, CKD III and macro vascular complications - Most recent A1c of 6.8 %. Goal A1c < 7.0 %.    - A1c at goal, praised pt on current glucose control -She is tolerating [place] without side effects, will increase -I am also going to reduce her a.m. dose of insulin mix -BMP stable today   MEDICATIONS: -Change  Humalog Mix  22 units with Breakfast and 20 units with supper -Increase Farxiga 10 mg  Daily   EDUCATION / INSTRUCTIONS: BG monitoring instructions: Patient is instructed to check her blood sugars 2 times a day, fasting and dinner. Call [facility] if: BG persistently < 70  I reviewed the Rule of 15 for the treatment of hypoglycemia in detail with the patient. Literature supplied.   2) Diabetic complications:  Eye: Does not have known diabetic retinopathy.  Neuro/ Feet: Does have known diabetic peripheral neuropathy .  Renal: Patient does have known baseline CKD. She   is on an ACEI/ARB at present.     3) Dyslipidemia:  - LDL at goal with rosuvastatin, but her Tg was elevated up to 446 mg/dL in 11/2019, but this normalized with fish oil    Medications Fish oil 1200 2  caps BID  Rosuvastatin 10 mg daily   4) Pituitary Microadenoma :  - Per pt, she has been diagnosed with pituitary adenoma in 2006. No prior sx . 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 -Denies galactorrhea at this time - Pituitary hormone check was normal 06/2020 - MRI showed 4/2 mm pituitary adenoma (06/2020)     F/U in 4 months    Signed electronically by: Mack Guise, MD  [facility]  [address], [place] [address] Phone: [phone] FAX: [phone]   CC: [name], NP [place] [address] Phone: [phone]  Fax: [phone]  Return to Endocrinology clinic as below: Future Appointments  Date Time Provider [place]  06/09/2021 10:30 AM Nigel Mormon, MD [facility] None  06/25/2021  1:40 PM Dashon Mcintire, Melanie Crazier, MD [facility] None

## 2021-02-21 NOTE — Patient Instructions (Addendum)
-   Change Humalog Mix  22 units with Breakfast and 20 units with supper - Increase Farxiga 10 mg, 1 tablet every morning    HOW TO TREAT LOW BLOOD SUGARS (Blood sugar LESS THAN 70 MG/DL) Please follow the RULE OF 15 for the treatment of hypoglycemia treatment (when your (blood sugars are less than 70 mg/dL)   STEP 1: Take 15 grams of carbohydrates when your blood sugar is low, which includes:  3-4 GLUCOSE TABS  OR 3-4 OZ OF JUICE OR REGULAR SODA OR ONE TUBE OF GLUCOSE GEL    STEP 2: RECHECK blood sugar in 15 MINUTES STEP 3: If your blood sugar is still low at the 15 minute recheck --> then, go back to STEP 1 and treat AGAIN with another 15 grams of carbohydrates.

## 2021-02-24 ENCOUNTER — Encounter: Payer: Self-pay | Admitting: Internal Medicine

## 2021-03-13 NOTE — Progress Notes (Signed)
Called and spoke with patient regarding her CAD results.

## 2021-04-25 ENCOUNTER — Ambulatory Visit: Payer: Medicare Other | Admitting: Gastroenterology

## 2021-04-28 NOTE — Progress Notes (Signed)
Letter sent to patient asking her to return call about missed appt. I have been unable to each pt by phone.

## 2021-04-29 ENCOUNTER — Encounter: Payer: Self-pay | Admitting: Nurse Practitioner

## 2021-05-18 HISTORY — PX: CATARACT EXTRACTION: SUR2

## 2021-06-09 ENCOUNTER — Other Ambulatory Visit: Payer: Self-pay

## 2021-06-09 ENCOUNTER — Encounter: Payer: Self-pay | Admitting: Cardiology

## 2021-06-09 ENCOUNTER — Ambulatory Visit: Payer: Medicare Other | Admitting: Cardiology

## 2021-06-09 VITALS — BP 163/69 | HR 64 | Temp 98.2°F | Resp 16 | Ht 62.0 in | Wt 232.4 lb

## 2021-06-09 DIAGNOSIS — I739 Peripheral vascular disease, unspecified: Secondary | ICD-10-CM

## 2021-06-09 DIAGNOSIS — I6523 Occlusion and stenosis of bilateral carotid arteries: Secondary | ICD-10-CM

## 2021-06-09 DIAGNOSIS — E782 Mixed hyperlipidemia: Secondary | ICD-10-CM

## 2021-06-09 NOTE — Progress Notes (Signed)
Patient referred by [name], NP for carotid artery calcification  Subjective:   Christina Solis, female    DOB: Aug 09, 1950, 71 y.o.   MRN: [id number]   Chief Complaint  Patient presents with   Asymptomatic bilateral carotid artery stenosis   Follow-up   PAD     HPI  71 y/o [place] American female with hypertension, hyperlipidemia, prior tobacco abuse, mod carotid artery stenosis, PAD without claudication.  Patient is doing well. She has been busy taking care of her granddaughter, 74, who recently underwent a cardiac surgery. Blood pressure is generally well controlled, elevated today. TG elevated on lipid panel check in 02/2021. No bleeding issues while on plavix.    Current Outpatient Medications on File Prior to Visit  Medication Sig Dispense Refill   albuterol (PROVENTIL) (2.5 MG/3ML) 0.083% nebulizer solution Take 2.5 mg by nebulization every 6 (six) hours as needed for wheezing or shortness of breath.      albuterol (VENTOLIN HFA) 108 (90 Base) MCG/ACT inhaler Inhale 1 puff into the lungs every 6 (six) hours as needed for wheezing or shortness of breath. ProAir     benzonatate (TESSALON) 200 MG capsule Take by mouth.     budesonide-formoterol (SYMBICORT) 160-4.5 MCG/ACT inhaler Inhale 2 puffs into the lungs 2 (two) times daily.     cetirizine-pseudoephedrine (ZYRTEC-D) 5-120 MG tablet Take 1 tablet by mouth daily. 15 tablet 0   clopidogrel (PLAVIX) 75 MG tablet Take 75 mg by mouth daily.   1   dapagliflozin propanediol (FARXIGA) 10 MG TABS tablet Take 1 tablet (10 mg total) by mouth daily before breakfast. 90 tablet 3   diclofenac Sodium (VOLTAREN) 1 % GEL Apply topically.     dicyclomine (BENTYL) 20 MG tablet Take 20 mg by mouth 4 (four) times daily as needed for spasms.      ferrous sulfate 325 (65 FE) MG tablet Take 325 mg by mouth daily with breakfast.      HUMALOG MIX 75/25 KWIKPEN (75-25) 100 UNIT/ML KwikPen Inject 22 Units into the skin daily with breakfast  AND 20 Units daily with supper. 45 mL 3   icosapent Ethyl (VASCEPA) 1 g capsule TAKE 2 CAPSULES(2 GRAMS) BY MOUTH TWICE DAILY 120 capsule 2   Insulin Pen Needle 32G X 4 MM MISC 1 Device by Does not apply route in the morning and at bedtime. 200 each 3   lisinopril-hydrochlorothiazide (PRINZIDE,ZESTORETIC) 20-12.5 MG per tablet Take 1 tablet by mouth daily.      lubiprostone (AMITIZA) 24 MCG capsule Take 24 mcg by mouth 2 (two) times daily with a meal.     Olopatadine HCl 0.2 % SOLN Apply 1 drop to eye daily. 2.5 mL 0   omeprazole (PRILOSEC) 40 MG capsule TAKE 1 CAPSULE(40 MG) BY MOUTH DAILY 90 capsule 2   oxyCODONE-acetaminophen (PERCOCET) 10-325 MG tablet Take 1 tablet by mouth every 4 (four) hours as needed.     pregabalin (LYRICA) 200 MG capsule Take 200 mg by mouth 3 (three) times daily.     promethazine (PHENERGAN) 25 MG tablet Take 25 mg by mouth every 6 (six) hours as needed for nausea or vomiting.      rosuvastatin (CRESTOR) 10 MG tablet Take 1 tablet (10 mg total) by mouth at bedtime. 90 tablet 3   SPIRIVA RESPIMAT 2.5 MCG/ACT AERS INHALE 2 PUFFS INTO THE LUNGS DAILY 4 g 5   Vitamin D, Ergocalciferol, (DRISDOL) 50000 UNITS CAPS capsule Take 50,000 Units by mouth every Tuesday.  No current facility-administered medications on file prior to visit.    Cardiovascular and other pertinent studies:  EKG 06/09/2021: Sinus rhythm 66 bpm  Left bundle branch block  Carotid artery duplex 02/04/2021:  Duplex suggests stenosis in the right internal carotid artery (50-69%).  Duplex suggests stenosis in the right external carotid artery (<50%).  Duplex suggests stenosis in the left internal carotid artery (50-69%).  Duplex suggests stenosis in the left external carotid artery (<50%).  Antegrade right vertebral artery flow. Antegrade left vertebral artery  flow.  Compared to the study done on 09/10/2020, no significant change. Follow up  in six months is appropriate if clinically  indicated.  ABI 02/28/2020:  This exam reveals moderately decreased perfusion of the right lower  extremity, noted at the dorsalis pedis and post tibial artery level (ABI  0.75) and normal perfusion of the left lower extremity (ABI 1.00).  There is mildly abnormal biphasic waveform at the ankles.  Carotid artery duplex  02/20/2020:  Stenosis in the right internal carotid artery (50-69%).  Stenosis in the left internal carotid artery (50-69%), lower end of  spectrum.  Antegrade right vertebral artery flow. Antegrade left vertebral artery  flow.  Follow up in six months is appropriate if clinically indicated.  EKG 02/15/2020: Sinus rhythm 100 bpm Left atrial enlargement Possible old anteroseptal infarct   Neck Xray 10/2019: No fracture or spondylolisthesis. No appreciable arthropathy. Relative lack of lordosis raises question of a degree of muscle spasm.   There is right carotid artery calcification.  Echocardiogram 2018: - Left ventricle: The cavity size was normal. There was moderate    focal basal hypertrophy. Systolic function was normal. The    estimated ejection fraction was in the range of 55% to 60%.    Possible hypokinesis of the midanteroseptal myocardium. There was    an increased relative contribution of atrial contraction to    ventricular filling. Doppler parameters are consistent with    abnormal left ventricular relaxation (grade 1 diastolic    dysfunction). Doppler parameters are consistent with high    ventricular filling pressure.  - Aortic valve: There was trivial regurgitation.  - Right ventricle: The cavity size was mildly dilated. Wall    thickness was normal.  - Tricuspid valve: There was trivial regurgitation.  - Pulmonic valve: There was mild regurgitation.   Abdominal aortogram 2017: Findings:  Abdominal aortogram reveals presence of 2 renal arteries one on either sides. They're widely patent. Mild atherosclerotic changes of the abdominal aorta  was evident. There is no evidence of dominant. Right Femoral Artery with Distal Runoff Revealed about a 50-60% Stenosis in the mid SFA. Below the right knee there is three-vessel runoff. Slow flow was evident in the below the knee vessel. Left femoral artery with distal runoff: The left SFA stent in the mid to distal segment is widely patent. In the proximal to mid segment the left SFA shows a 30-40% stenosis. Below the knee there is three-vessel runoff. Impression: Mild to moderate diffuse disease, can be treated medically, she'll be discharged home without patient follow-up. Needs cardiac risk modification.     Recent labs: 02/21/2021: Glucose 94, BUN/Cr 24/1.15. EGFR 48. Na/K 142/3.8.  H/H 12/39. MCV 94. Platelets 250 Chol 121, TG 297, HDL 21, LDL 41 HbA1C 6.8%  06/2020: Chol 147, TG 143, HDL 39, LDL 79  12/07/2019: Glucose 199, BUN/Cr 18/1.34. EGFR 47. Na/K 137/4.0 HbA1C 6.8% Chol 157, TG 446, HDL 20, dLDL 69  09/2019: H/H 12/39. MCV 98. Platelets 248 Trop  HS normal   Review of Systems  Cardiovascular:  Negative for chest pain, dyspnea on exertion, leg swelling, palpitations and syncope.        Vitals:   06/09/21 1033 06/09/21 1034  BP: (!) 169/73 (!) 163/69  Pulse: 61 64  Resp:    Temp:    SpO2: 94% 94%     Body mass index is 42.51 kg/m. Filed Weights   06/09/21 1017  Weight: 232 lb 6.4 oz (105.4 kg)     Objective:   Physical Exam Vitals and nursing note reviewed.  Constitutional:      General: She is not in acute distress. Neck:     Vascular: No JVD.  Cardiovascular:     Rate and Rhythm: Normal rate and regular rhythm.     Heart sounds: Normal heart sounds. No murmur heard. Pulmonary:     Effort: Pulmonary effort is normal.     Breath sounds: Normal breath sounds. No wheezing or rales.           Assessment & Recommendations:   71 y/o [place] American female with hypertension, hyperlipidemia, prior tobacco abuse, mod carotid artery stenosis,  PAD without claudication.  Carotid artery stenosis: Mod b/l carotid artery stenoses. Asymptomatic. Continue Plavix, statin Check fasting lipid panel, direct LDL  Hypertriglyceridemia/type II DM: TG elevated in 11/2019. Will get lipid panel results from PCP.  PAD: No claudication. Continue medical management.  Hypertension: BP elevated today. Recheck with PCP in two weeks. If remains elevated, could consider adding amlodipine.  F/u in 1 year   Nigel Mormon, MD Pager: [phone] Office: [phone]

## 2021-06-25 ENCOUNTER — Encounter: Payer: Self-pay | Admitting: Internal Medicine

## 2021-06-25 ENCOUNTER — Ambulatory Visit (INDEPENDENT_AMBULATORY_CARE_PROVIDER_SITE_OTHER): Payer: Medicare Other | Admitting: Internal Medicine

## 2021-06-25 ENCOUNTER — Other Ambulatory Visit: Payer: Self-pay

## 2021-06-25 VITALS — BP 112/76 | HR 80 | Ht 62.0 in | Wt 231.0 lb

## 2021-06-25 DIAGNOSIS — E1142 Type 2 diabetes mellitus with diabetic polyneuropathy: Secondary | ICD-10-CM

## 2021-06-25 DIAGNOSIS — D352 Benign neoplasm of pituitary gland: Secondary | ICD-10-CM | POA: Diagnosis not present

## 2021-06-25 DIAGNOSIS — Z794 Long term (current) use of insulin: Secondary | ICD-10-CM | POA: Diagnosis not present

## 2021-06-25 DIAGNOSIS — N1831 Chronic kidney disease, stage 3a: Secondary | ICD-10-CM

## 2021-06-25 DIAGNOSIS — E782 Mixed hyperlipidemia: Secondary | ICD-10-CM

## 2021-06-25 DIAGNOSIS — E1122 Type 2 diabetes mellitus with diabetic chronic kidney disease: Secondary | ICD-10-CM

## 2021-06-25 LAB — POCT GLYCOSYLATED HEMOGLOBIN (HGB A1C): Hemoglobin A1C: 6.6 % — AB (ref 4.0–5.6)

## 2021-06-25 LAB — LIPID PANEL
Cholesterol: 147 mg/dL (ref 0–200)
HDL: 22.2 mg/dL — ABNORMAL LOW (ref >39.00)
NonHDL: 124.54
Total CHOL/HDL Ratio: 7
Triglycerides: 381 mg/dL — ABNORMAL HIGH (ref 0.0–149.0)
VLDL: 76.2 mg/dL — ABNORMAL HIGH (ref 0.0–40.0)

## 2021-06-25 LAB — POCT GLUCOSE (DEVICE FOR HOME USE): Glucose Fasting, POC: 120 mg/dL — AB (ref 70–99)

## 2021-06-25 LAB — BASIC METABOLIC PANEL
BUN: 19 mg/dL (ref 6–23)
CO2: 32 mEq/L (ref 19–32)
Calcium: 9.4 mg/dL (ref 8.4–10.5)
Chloride: 105 mEq/L (ref 96–112)
Creatinine, Ser: 1.18 mg/dL (ref 0.40–1.20)
GFR: 47.25 mL/min — ABNORMAL LOW (ref >60.00)
Glucose, Bld: 113 mg/dL — ABNORMAL HIGH (ref 70–99)
Potassium: 4.4 mEq/L (ref 3.5–5.1)
Sodium: 141 mEq/L (ref 135–145)

## 2021-06-25 MED ORDER — DAPAGLIFLOZIN PROPANEDIOL 10 MG PO TABS: 10.0000 mg | ORAL_TABLET | Freq: Every day | ORAL | 3 refills | Status: DC

## 2021-06-25 NOTE — Progress Notes (Signed)
Name: Christina Solis  Age/ Sex: 71 y.o., female   MRN/ DOB: [id number], 1950-08-16     PCP: [name], NP   Reason for Endocrinology Evaluation: Type 2 Diabetes Mellitus  Initial Endocrine Consultative Visit:  05/03/2019    PATIENT IDENTIFIER: Christina Solis is a 71 y.o. female with a past medical history of HTN, COPD, and T2DM. The patient has followed with Endocrinology clinic since 05/03/2019 for consultative assistance with management of her diabetes.  DIABETIC HISTORY:  Christina Solis was diagnosed with T2 DM in 2008 .She was on metformin that was stopped due to CKD, has been on insulin since 2009. Her hemoglobin A1c has ranged from 6.9% in 2015, peaking at 7.4% in 2020.  On her initial visit to our clinic she had an A1c of 7.4%, she was on Humalog mix, which was continued and adjusted.  PITUITARY TUMOR :  She was first diagnosed with pituitary adenoma in 2006 . She was put on medication for ~ 8 month but when her level normalized ,the medicine was stopped. She had galactorrhea up to 2018.  Repeat prolactin in 2022 was normal with appropriately elevated FSH and normal IGF  Prolactin was elevated in 2023   She is a retired Marine scientist Lives with daughter SUBJECTIVE:   During the last visit (02/21/2021): A1c 6.6% . We continued insulin mix  and increased farxiga      Today (06/25/2021): Christina Solis is here for follow-up on diabetes management. She checks her blood sugars 3 x a day  The patient has not had hypoglycemic episodes since the last clinic visit.    She was evaluated by cardiology due to carotid artery calcifications in 05/2020, medical management with Plavix and statins recommended.    Saw Gyn for postmenopausal bleed  in 2022, found to have a fibroid , no more bleeding.   Has been noted with weight loss  Denies nausea, vomiting or diarrhea   HOME DIABETES REGIMEN:  Humalog mix 22 units with breakfast and 20 units with supper Farxiga 10 mg daily     Statin:  Yes ACE-I/ARB: Yes    METER DOWNLOAD SUMMARY: Date range evaluated: 9/23-10/11/2020 Average Number Tests/Day = 1.1 Overall Mean FS Glucose = 168   BG Ranges Low = 76 High = 357   Hypoglycemic Events/30 Days: BG < 50 = 0 Episodes of symptomatic severe hypoglycemia = 0    DIABETIC COMPLICATIONS: Microvascular complications:  Neuropathy, CKD III Denies: retinopathy Last eye exam: scheduled 10/29th, 2022   Macrovascular complications:  CHF, PVD Denies: CAD, CVA    HISTORY:  Past Medical History:  Past Medical History:  Diagnosis Date   Anemia    sickle cell trait   Anxiety    Arthritis    "knees" (10/12/2013)   CHF (congestive heart failure) (HCC)    Chronic back pain    Chronic bronchitis ([place])    "got it q yr when I lived in [place]"   Complication of anesthesia    hard to put under   COPD (chronic obstructive pulmonary disease) ([place])    Dysrhythmia    Family history of anesthesia complication    sister hard to wake up   Family history of anesthesia complication    niece have itching   Fibromyalgia    GERD (gastroesophageal reflux disease)    Hyperlipidemia    Hypertension    Irregular heart beat    Migraine    "used to get them alot; not regular anymore" (10/12/2013)  PAD (peripheral artery disease) ([place])    Pituitary tumor    followed at [facility]; on cabergoline 09/2013   Pneumonia    "twice"   Type II diabetes mellitus ([place])    Past Surgical History:  Past Surgical History:  Procedure Laterality Date   ABDOMINAL AORTAGRAM N/A 06/28/2012   Procedure: ABDOMINAL Maxcine Ham;  Surgeon: Laverda Page, MD;  Location: [facility] CATH LAB;  Service: Cardiovascular;  Laterality: N/A;   APPENDECTOMY     BACK SURGERY     x 2, lower back   BILATERAL CARPAL TUNNEL RELEASE     CARDIAC CATHETERIZATION     CESAREAN SECTION  1974; 1976; [place] N/A 10/12/2013   Procedure: LAPAROSCOPIC CHOLECYSTECTOMY WITH INTRAOPERATIVE CHOLANGIOGRAM;   Surgeon: [name]. Georgette Dover, MD;  Location: [facility];  Service: General;  Laterality: N/A;   COLONOSCOPY     FEMORAL ARTERY STENT Left 06/28/2012   SFA   LAPAROSCOPIC CHOLECYSTECTOMY  10/12/2013   LEFT HEART CATHETERIZATION WITH CORONARY ANGIOGRAM N/A 06/28/2012   Procedure: LEFT HEART CATHETERIZATION WITH CORONARY ANGIOGRAM;  Surgeon: Laverda Page, MD;  Location: [facility] CATH LAB;  Service: Cardiovascular;  Laterality: N/A;   LOWER EXTREMITY ANGIOGRAM N/A 06/28/2012   Procedure: LOWER EXTREMITY ANGIOGRAM;  Surgeon: Laverda Page, MD;  Location: [facility] CATH LAB;  Service: Cardiovascular;  Laterality: N/A;   PERIPHERAL VASCULAR CATHETERIZATION N/A 09/10/2015   Procedure: Abdominal Aortogram w/Lower Extremity;  Surgeon: Adrian Prows, MD;  Location: [facility] CV LAB;  Service: Cardiovascular;  Laterality: N/A;   POSTERIOR LUMBAR FUSION     REPLACEMENT TOTAL KNEE Left    TONSILLECTOMY     TUBAL LIGATION  1978   Social History:  reports that she quit smoking about 5 years ago. Her smoking use included cigarettes. She started smoking about 21 years ago. She has a 23.00 pack-year smoking history. She has never used smokeless tobacco. She reports that she does not drink alcohol and does not use drugs. Family History:  Family History  Problem Relation Age of Onset   Diabetes Mother    Kidney disease Mother    Cancer Father    Pancreatic cancer Father    Kidney disease Brother    Cancer Brother    Colon cancer Neg Hx    Inflammatory bowel disease Neg Hx    Esophageal cancer Neg Hx    Liver disease Neg Hx    Rectal cancer Neg Hx    Stomach cancer Neg Hx      HOME MEDICATIONS: Allergies as of 06/25/2021       Reactions   Coconut Oil Hives        Medication List        Accurate as of June 25, 2021  1:44 PM. If you have any questions, ask your nurse or doctor.          albuterol 108 (90 Base) MCG/ACT inhaler Commonly known as: VENTOLIN HFA Inhale 1 puff into the lungs every 6 (six)  hours as needed for wheezing or shortness of breath. ProAir   albuterol (2.5 MG/3ML) 0.083% nebulizer solution Commonly known as: PROVENTIL Take 2.5 mg by nebulization every 6 (six) hours as needed for wheezing or shortness of breath.   benzonatate 200 MG capsule Commonly known as: TESSALON Take by mouth.   budesonide-formoterol 160-4.5 MCG/ACT inhaler Commonly known as: SYMBICORT Inhale 2 puffs into the lungs 2 (two) times daily.   cetirizine-pseudoephedrine 5-120 MG tablet Commonly known as: ZYRTEC-D Take 1 tablet by  mouth daily.   clopidogrel 75 MG tablet Commonly known as: PLAVIX Take 75 mg by mouth daily.   dapagliflozin propanediol 10 MG Tabs tablet Commonly known as: Farxiga Take 1 tablet (10 mg total) by mouth daily before breakfast.   diclofenac Sodium 1 % Gel Commonly known as: VOLTAREN Apply topically.   dicyclomine 20 MG tablet Commonly known as: BENTYL Take 20 mg by mouth 4 (four) times daily as needed for spasms.   ferrous sulfate 325 (65 FE) MG tablet Take 325 mg by mouth daily with breakfast.   HumaLOG Mix 75/25 KwikPen (75-25) 100 UNIT/ML Kwikpen Generic drug: Insulin Lispro Prot & Lispro Inject 22 Units into the skin daily with breakfast AND 20 Units daily with supper.   icosapent Ethyl 1 g capsule Commonly known as: VASCEPA TAKE 2 CAPSULES(2 GRAMS) BY MOUTH TWICE DAILY   Insulin Pen Needle 32G X 4 MM Misc 1 Device by Does not apply route in the morning and at bedtime.   lisinopril-hydrochlorothiazide 20-12.5 MG tablet Commonly known as: ZESTORETIC Take 1 tablet by mouth daily.   lubiprostone 24 MCG capsule Commonly known as: AMITIZA Take 24 mcg by mouth 2 (two) times daily with a meal.   Olopatadine HCl 0.2 % Soln Apply 1 drop to eye daily.   omeprazole 40 MG capsule Commonly known as: PRILOSEC TAKE 1 CAPSULE(40 MG) BY MOUTH DAILY   oxyCODONE-acetaminophen 10-325 MG tablet Commonly known as: PERCOCET Take 1 tablet by mouth every 4  (four) hours as needed.   pregabalin 200 MG capsule Commonly known as: LYRICA Take 200 mg by mouth 3 (three) times daily.   promethazine 25 MG tablet Commonly known as: PHENERGAN Take 25 mg by mouth every 6 (six) hours as needed for nausea or vomiting.   rosuvastatin 10 MG tablet Commonly known as: CRESTOR Take 1 tablet (10 mg total) by mouth at bedtime.   Spiriva Respimat 2.5 MCG/ACT Aers Generic drug: Tiotropium Bromide Monohydrate INHALE 2 PUFFS INTO THE LUNGS DAILY   Vitamin D (Ergocalciferol) 1.25 MG (50000 UNIT) Caps capsule Commonly known as: DRISDOL Take 50,000 Units by mouth every Tuesday.         OBJECTIVE:   Vital Signs: BP 112/76 (BP Location: Left Arm, Patient Position: Sitting, Cuff Size: Small)    Pulse 80    Ht 5\' 2"  (1.575 m)    Wt 231 lb (104.8 kg)    SpO2 96%    BMI 42.25 kg/m   Wt Readings from Last 3 Encounters:  06/25/21 231 lb (104.8 kg)  06/09/21 232 lb 6.4 oz (105.4 kg)  02/21/21 237 lb 6.4 oz (107.7 kg)     Exam: General: Pt appears well and is in NAD  Lungs: Clear with good BS bilat with no rales, rhonchi, or wheezes  Heart: RRR with normal S1 and S2 and no gallops; no murmurs; no rub  Abdomen: Normoactive bowel sounds, soft, nontender, without masses or organomegaly palpable  Extremities: No pretibial edema.   Skin: Normal texture and temperature to palpation.  Neuro: MS is good with appropriate affect, pt is alert and Ox3    DM foot exam: 10/18/2020   The skin of the feet is intact without sores or ulcerations. The pedal pulses are 1+ on right and 1+ on left. The sensation is intact to a screening 5.07, 10 gram monofilament bilaterally    DATA REVIEWED:  Lab Results  Component Value Date   HGBA1C 6.6 (A) 06/25/2021   HGBA1C 6.8 (A) 02/21/2021   HGBA1C  6.7 (A) 10/18/2020   Results for Christina Solis, Christina Solis (MRN [id number]) as of 02/24/2021 07:35  Ref. Range 02/21/2021 14:06  Sodium Latest Ref Range: 135 - 145 mEq/L 142  Potassium  Latest Ref Range: 3.5 - 5.1 mEq/L 3.8  Chloride Latest Ref Range: 96 - 112 mEq/L 107  CO2 Latest Ref Range: 19 - 32 mEq/L 27  Glucose Latest Ref Range: 70 - 99 mg/dL 94  BUN Latest Ref Range: 6 - 23 mg/dL 24 (H)  Creatinine Latest Ref Range: 0.40 - 1.20 mg/dL 1.15  Calcium Latest Ref Range: 8.4 - 10.5 mg/dL 9.2  GFR Latest Ref Range: >60.00 mL/min 48.85 (L)    Latest Reference Range & Units 06/25/21 14:04  Sodium 135 - 145 mEq/L 141  Potassium 3.5 - 5.1 mEq/L 4.4  Chloride 96 - 112 mEq/L 105  CO2 19 - 32 mEq/L 32  Glucose 70 - 99 mg/dL 113 (H)  BUN 6 - 23 mg/dL 19  Creatinine 0.40 - 1.20 mg/dL 1.18  Calcium 8.4 - 10.5 mg/dL 9.4  GFR >60.00 mL/min 47.25 (L)    Latest Reference Range & Units 06/25/21 14:04  Total CHOL/HDL Ratio  7  Cholesterol 0 - 200 mg/dL 147  HDL Cholesterol >39.00 mg/dL 22.20 (L)  Direct LDL mg/dL 76.0  MICROALB/CREAT RATIO 0.0 - 30.0 mg/g 1.0  NonHDL  124.54  Triglycerides 0.0 - 149.0 mg/dL 381.0 (H)  VLDL 0.0 - 40.0 mg/dL 76.2 (H)    Latest Reference Range & Units 06/25/21 14:04  Prolactin ng/mL 42.5 (H)     Latest Reference Range & Units 06/25/21 14:04  Creatinine,U mg/dL 72.1  Microalb, Ur 0.0 - 1.9 mg/dL <0.7  MICROALB/CREAT RATIO 0.0 - 30.0 mg/g 1.0   ASSESSMENT / PLAN / RECOMMENDATIONS:   1) Type 2 Diabetes Mellitus, Optimally controlled, With neuropathic, CKD III and macro vascular complications - Most recent A1c of 6.8 %. Goal A1c < 7.0 %.    - A1c at goal, praised pt on current glucose control -She is tolerating [place] without side effects, will increase -I am also going to reduce her a.m. dose of insulin mix -BMP stable today   MEDICATIONS: -Change  Humalog Mix  22 units with Breakfast and 20 units with supper -Increase Farxiga 10 mg  Daily   EDUCATION / INSTRUCTIONS: BG monitoring instructions: Patient is instructed to check her blood sugars 2 times a day, fasting and dinner. Call [facility] if: BG persistently <  70  I reviewed the Rule of 15 for the treatment of hypoglycemia in detail with the patient. Literature supplied.   2) Diabetic complications:  Eye: Does not have known diabetic retinopathy.  Neuro/ Feet: Does have known diabetic peripheral neuropathy .  Renal: Patient does have known baseline CKD. She   is on an ACEI/ARB at present.     3) Dyslipidemia:  - LDL at goal with rosuvastatin, but her Tg continues to be elevated -We will increase rosuvastatin as below -We will emphasize compliance with Vascepa   Medications Vascepa 1 g, 2 caps caps BID  Increase rosuvastatin 20 mg daily   4) Pituitary Microadenoma :  - Per pt, she has been diagnosed with pituitary adenoma in 2006. No prior sx . 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 - Pituitary hormone check was normal 06/2020 but repeat prolactin today shows slight elevation, will continue to monitor at this time - MRI  showed 4x 2 mm pituitary adenoma (06/2020)     F/U in 4 months    Signed electronically by: Mack Guise, MD  [facility]  [facility] [address] [place], [place] [address] Phone: [phone] FAX: [phone]   CC: [name], NP [place] [address] Phone: [phone]  Fax: [phone]  Return to Endocrinology clinic as below: Future Appointments  Date Time Provider [place]  04/29/2022 10:00 AM [facility]-ECHO/VAS 1 [facility]-IMG None  06/03/2022 10:30 AM Patwardhan, Reynold Bowen, MD [facility]-PCV None

## 2021-06-25 NOTE — Patient Instructions (Addendum)
-   Keep Up the Good Work ! - Continue  Humalog Mix  22 units with Breakfast and 20 units with supper - Continue Farxiga 10 mg, 1 tablet every morning    HOW TO TREAT LOW BLOOD SUGARS (Blood sugar LESS THAN 70 MG/DL) Please follow the RULE OF 15 for the treatment of hypoglycemia treatment (when your (blood sugars are less than 70 mg/dL)   STEP 1: Take 15 grams of carbohydrates when your blood sugar is low, which includes:  3-4 GLUCOSE TABS  OR 3-4 OZ OF JUICE OR REGULAR SODA OR ONE TUBE OF GLUCOSE GEL    STEP 2: RECHECK blood sugar in 15 MINUTES STEP 3: If your blood sugar is still low at the 15 minute recheck --> then, go back to STEP 1 and treat AGAIN with another 15 grams of carbohydrates.

## 2021-06-26 ENCOUNTER — Telehealth: Payer: Self-pay | Admitting: Internal Medicine

## 2021-06-26 ENCOUNTER — Encounter: Payer: Self-pay | Admitting: Internal Medicine

## 2021-06-26 DIAGNOSIS — E781 Pure hyperglyceridemia: Secondary | ICD-10-CM

## 2021-06-26 LAB — MICROALBUMIN / CREATININE URINE RATIO
Creatinine,U: 72.1 mg/dL
Microalb Creat Ratio: 1 mg/g (ref 0.0–30.0)
Microalb, Ur: <0.7 mg/dL (ref 0.0–1.9)

## 2021-06-26 LAB — PROLACTIN: Prolactin: 42.5 ng/mL — ABNORMAL HIGH

## 2021-06-26 LAB — LDL CHOLESTEROL, DIRECT: Direct LDL: 76 mg/dL

## 2021-06-26 MED ORDER — ROSUVASTATIN CALCIUM 20 MG PO TABS: 20.0000 mg | ORAL_TABLET | Freq: Every day | ORAL | 3 refills | Status: DC

## 2021-06-26 MED ORDER — ICOSAPENT ETHYL 1 G PO CAPS: 2.0000 g | ORAL_CAPSULE | Freq: Two times a day (BID) | ORAL | 2 refills | Status: DC

## 2021-06-26 NOTE — Telephone Encounter (Signed)
Vm left for patient to callback for results

## 2021-06-26 NOTE — Telephone Encounter (Signed)
Please let the patient know that her triglycerides, which is part of her cholesterol panel continues to be very high, please let her know I have increased her rosuvastatin (Crestor) from 10 mg to 20 mg daily please make sure that she is taking Vascepa 2 capsules twice daily   Let her know that her kidney function continues to be stable   Her prolactin level is slightly elevated, there is no need to put her on medication right now unless it continues to be elevated.  But historically it continues to fluctuate.    Thanks

## 2021-06-26 NOTE — Telephone Encounter (Signed)
Patient advised and verbalized understanding 

## 2021-07-30 ENCOUNTER — Other Ambulatory Visit: Payer: Self-pay | Admitting: Nurse Practitioner

## 2021-07-30 DIAGNOSIS — M5416 Radiculopathy, lumbar region: Secondary | ICD-10-CM

## 2021-08-17 ENCOUNTER — Ambulatory Visit
Admission: RE | Admit: 2021-08-17 | Discharge: 2021-08-17 | Disposition: A | Discharge status: Home / Self Care | Payer: Medicare Other | Source: Ambulatory Visit | Attending: Nurse Practitioner | Admitting: Nurse Practitioner

## 2021-08-17 DIAGNOSIS — M5416 Radiculopathy, lumbar region: Secondary | ICD-10-CM

## 2021-09-19 ENCOUNTER — Other Ambulatory Visit: Payer: Self-pay | Admitting: Nurse Practitioner

## 2021-09-19 DIAGNOSIS — I721 Aneurysm of artery of upper extremity: Secondary | ICD-10-CM

## 2021-09-26 ENCOUNTER — Other Ambulatory Visit: Payer: Medicare Other

## 2021-10-16 ENCOUNTER — Other Ambulatory Visit: Payer: Self-pay

## 2021-10-16 ENCOUNTER — Emergency Department (HOSPITAL_COMMUNITY)
Admission: EM | Admit: 2021-10-16 | Discharge: 2021-10-16 | Disposition: A | Discharge status: Home / Self Care | Payer: Medicare Other | Attending: Emergency Medicine | Admitting: Emergency Medicine

## 2021-10-16 ENCOUNTER — Emergency Department (HOSPITAL_COMMUNITY): Payer: Medicare Other

## 2021-10-16 DIAGNOSIS — E1165 Type 2 diabetes mellitus with hyperglycemia: Secondary | ICD-10-CM | POA: Diagnosis not present

## 2021-10-16 DIAGNOSIS — Z794 Long term (current) use of insulin: Secondary | ICD-10-CM | POA: Diagnosis not present

## 2021-10-16 DIAGNOSIS — R079 Chest pain, unspecified: Secondary | ICD-10-CM

## 2021-10-16 DIAGNOSIS — J449 Chronic obstructive pulmonary disease, unspecified: Secondary | ICD-10-CM | POA: Diagnosis not present

## 2021-10-16 DIAGNOSIS — R0789 Other chest pain: Secondary | ICD-10-CM | POA: Diagnosis present

## 2021-10-16 DIAGNOSIS — Z7902 Long term (current) use of antithrombotics/antiplatelets: Secondary | ICD-10-CM | POA: Insufficient documentation

## 2021-10-16 DIAGNOSIS — I5030 Unspecified diastolic (congestive) heart failure: Secondary | ICD-10-CM | POA: Diagnosis not present

## 2021-10-16 DIAGNOSIS — R7989 Other specified abnormal findings of blood chemistry: Secondary | ICD-10-CM | POA: Diagnosis not present

## 2021-10-16 DIAGNOSIS — Z7951 Long term (current) use of inhaled steroids: Secondary | ICD-10-CM | POA: Insufficient documentation

## 2021-10-16 DIAGNOSIS — I110 Hypertensive heart disease with heart failure: Secondary | ICD-10-CM | POA: Diagnosis not present

## 2021-10-16 DIAGNOSIS — Z79899 Other long term (current) drug therapy: Secondary | ICD-10-CM | POA: Diagnosis not present

## 2021-10-16 LAB — CBC
HCT: 39.2 % (ref 36.0–46.0)
Hemoglobin: 12.5 g/dL (ref 12.0–15.0)
MCH: 30.9 pg (ref 26.0–34.0)
MCHC: 31.9 g/dL (ref 30.0–36.0)
MCV: 96.8 fL (ref 80.0–100.0)
Platelets: 225 10*3/uL (ref 150–400)
RBC: 4.05 MIL/uL (ref 3.87–5.11)
RDW: 15 % (ref 11.5–15.5)
WBC: 10.4 10*3/uL (ref 4.0–10.5)
nRBC: 0 % (ref 0.0–0.2)

## 2021-10-16 LAB — BASIC METABOLIC PANEL
Anion gap: 7 (ref 5–15)
BUN: 28 mg/dL — ABNORMAL HIGH (ref 8–23)
CO2: 24 mmol/L (ref 22–32)
Calcium: 8.9 mg/dL (ref 8.9–10.3)
Chloride: 110 mmol/L (ref 98–111)
Creatinine, Ser: 1.31 mg/dL — ABNORMAL HIGH (ref 0.44–1.00)
GFR, Estimated: 44 mL/min — ABNORMAL LOW (ref >60)
Glucose, Bld: 94 mg/dL (ref 70–99)
Potassium: 4.4 mmol/L (ref 3.5–5.1)
Sodium: 141 mmol/L (ref 135–145)

## 2021-10-16 LAB — CBG MONITORING, ED: Glucose-Capillary: 102 mg/dL — ABNORMAL HIGH (ref 70–99)

## 2021-10-16 LAB — TROPONIN I (HIGH SENSITIVITY)
Troponin I (High Sensitivity): 5 ng/L (ref <18)
Troponin I (High Sensitivity): 4 ng/L (ref <18)

## 2021-10-16 MED ORDER — LIDOCAINE 5 % EX PTCH
1.0000 | MEDICATED_PATCH | CUTANEOUS | Status: DC
Start: 2021-10-16 — End: 2021-10-16
  Administered 2021-10-16: 1 via TRANSDERMAL
  Filled 2021-10-16: qty 1

## 2021-10-16 MED ORDER — HYDROCODONE-ACETAMINOPHEN 5-325 MG PO TABS
1.0000 | ORAL_TABLET | Freq: Once | ORAL | Status: AC
  Administered 2021-10-16: 1 via ORAL
  Filled 2021-10-16: qty 1

## 2021-10-16 MED ORDER — HYDROMORPHONE HCL 1 MG/ML IJ SOLN
0.5000 mg | Freq: Once | INTRAMUSCULAR | Status: AC
  Administered 2021-10-16: 0.5 mg via INTRAVENOUS
  Filled 2021-10-16: qty 1

## 2021-10-16 MED ORDER — MORPHINE SULFATE (PF) 4 MG/ML IV SOLN
4.0000 mg | Freq: Once | INTRAVENOUS | Status: AC
  Administered 2021-10-16: 4 mg via INTRAVENOUS
  Filled 2021-10-16: qty 1

## 2021-10-16 MED ORDER — ONDANSETRON HCL 4 MG/2ML IJ SOLN
4.0000 mg | Freq: Once | INTRAMUSCULAR | Status: AC
  Administered 2021-10-16: 4 mg via INTRAVENOUS
  Filled 2021-10-16: qty 2

## 2021-10-16 NOTE — ED Provider Notes (Signed)
[facility] EMERGENCY DEPARTMENT Provider Note   CSN: [id number] Arrival date & time: 10/16/21  1024     History  Chief Complaint  Patient presents with   Chest Pain    Christina Solis is a 71 y.o. female.  71 year old female with past medical history of COPD, hypertension, fibromyalgia, anxiety, CHF, hyperlipidemia, GERD, diabetes, PAD with left leg stent brought in by EMS from her pain management doctor's office with complaint of left-sided chest pain.  Patient states that she has a torn left rotator cuff, did not sleep well due to pain in her left shoulder and did not think much of this until she developed pain on the left side of her chest today while driving to her doctor's office.  Pain is described as a throbbing sensation, located deep to her left breast and radiates to left axillary area.  Pain became worse as she was walking into the doctor's office, associated diaphoresis and shortness of breath.  EMS was called, gave aspirin and nitro.  Patient states that she continues to have throbbing left-sided chest pain, is no longer diaphoretic or short of breath.  Pain at this point is worse with taking deep breaths.  Also worse with even light touch of the left side of her chest or abdomen.  No other complaints or concerns today.      Home Medications Prior to Admission medications   Medication Sig Start Date End Date Taking? Authorizing Provider  benzonatate (TESSALON) 200 MG capsule Take by mouth. 05/30/20  Yes [provider]  clopidogrel (PLAVIX) 75 MG tablet Take 75 mg by mouth daily.  08/15/15  Yes [provider]  dapagliflozin propanediol (FARXIGA) 10 MG TABS tablet Take 1 tablet (10 mg total) by mouth daily before breakfast. 06/25/21  Yes Shamleffer, Melanie Crazier, MD  diclofenac Sodium (VOLTAREN) 1 % GEL Apply topically.   Yes [provider]  ferrous sulfate 325 (65 FE) MG tablet Take 325 mg by mouth daily with breakfast.  07/30/15   Yes [provider]  HUMALOG MIX 75/25 KWIKPEN (75-25) 100 UNIT/ML KwikPen Inject 22 Units into the skin daily with breakfast AND 20 Units daily with supper. Patient taking differently: Inject 22 Units into the skin daily with breakfast AND 24 Units daily with supper. 02/21/21  Yes Shamleffer, Melanie Crazier, MD  lisinopril-hydrochlorothiazide (PRINZIDE,ZESTORETIC) 20-12.5 MG per tablet Take 1 tablet by mouth daily.    Yes [provider]  lubiprostone (AMITIZA) 24 MCG capsule Take 24 mcg by mouth 2 (two) times daily with a meal.   Yes [provider]  mometasone (NASONEX) 50 MCG/ACT nasal spray Place 2 sprays into the nose as needed. 09/18/21  Yes [provider]  Multiple Vitamin (MULTIVITAMIN) capsule Take 1 capsule by mouth daily.   Yes [provider]  naproxen (NAPROSYN) 250 MG tablet Take 250 mg by mouth 2 (two) times daily. 09/13/21  Yes [provider]  Olopatadine HCl 0.2 % SOLN Apply 1 drop to eye daily. Patient taking differently: Apply 1 drop to eye as needed. 08/27/17  Yes Yu, Amy V, PA-C  omeprazole (PRILOSEC) 40 MG capsule TAKE 1 CAPSULE(40 MG) BY MOUTH DAILY 07/28/19  Yes Mansouraty, Telford Nab., MD  oxyCODONE-acetaminophen (PERCOCET) 10-325 MG tablet Take 1 tablet by mouth every 4 (four) hours as needed. 04/07/19  Yes [provider]  pregabalin (LYRICA) 200 MG capsule Take 200 mg by mouth 3 (three) times daily.   Yes [provider]  promethazine (PHENERGAN) 25  MG tablet Take 25 mg by mouth every 6 (six) hours as needed for nausea or vomiting.  07/30/15  Yes [provider]  rosuvastatin (CRESTOR) 20 MG tablet Take 1 tablet (20 mg total) by mouth daily. 06/26/21  Yes Shamleffer, Melanie Crazier, MD  SPIRIVA RESPIMAT 2.5 MCG/ACT AERS INHALE 2 PUFFS INTO THE LUNGS DAILY 10/17/19  Yes Margaretha Seeds, MD  tiZANidine (ZANAFLEX) 4 MG tablet Take 4 mg by mouth 3 (three) times daily as needed. 09/25/21  Yes [provider]  albuterol (PROVENTIL) (2.5 MG/3ML) 0.083% nebulizer solution Take 2.5 mg by nebulization every 6 (six) hours as needed for wheezing or shortness of breath.  Patient not taking: Reported on 10/16/2021 06/30/15   [provider]  albuterol (VENTOLIN HFA) 108 (90 Base) MCG/ACT inhaler Inhale 1 puff into the lungs every 6 (six) hours as needed for wheezing or shortness of breath. ProAir Patient not taking: Reported on 10/16/2021    [provider]  budesonide-formoterol (SYMBICORT) 160-4.5 MCG/ACT inhaler Inhale 2 puffs into the lungs 2 (two) times daily. Patient not taking: Reported on 10/16/2021    [provider]  cetirizine-pseudoephedrine (ZYRTEC-D) 5-120 MG tablet Take 1 tablet by mouth daily. Patient not taking: Reported on 10/16/2021 08/27/17   Ok Edwards, PA-C  dicyclomine (BENTYL) 20 MG tablet Take 20 mg by mouth 4 (four) times daily as needed for spasms.  Patient not taking: Reported on 10/16/2021 09/06/13   [provider]  icosapent Ethyl (VASCEPA) 1 g capsule Take 2 capsules (2 g total) by mouth 2 (two) times daily. Patient not taking: Reported on 10/16/2021 06/26/21   Shamleffer, Melanie Crazier, MD  Insulin Pen Needle 32G X 4 MM MISC 1 Device by Does not apply route in the morning and at bedtime. 10/18/20   Shamleffer, Melanie Crazier, MD  Vitamin D, Ergocalciferol, (DRISDOL) 50000 UNITS CAPS capsule Take 50,000 Units by mouth every Tuesday.  Patient not taking: Reported on 10/16/2021    [provider]      Allergies    Coconut (cocos nucifera)    Review of Systems   Review of Systems Negative except as per HPI Physical Exam Updated Vital Signs BP (!) 113/57   Pulse 75   Temp 97.8 F (36.6 C) (Oral)   Resp 13   Ht '5\' 2"'$  (1.575 m)   Wt 99.8 kg   SpO2 94%   BMI 40.24 kg/m  Physical Exam Vitals and nursing note reviewed.  Constitutional:      General: She is not in acute distress.    Appearance: She is well-developed. She is not  diaphoretic.  HENT:     Head: Normocephalic and atraumatic.  Cardiovascular:     Rate and Rhythm: Normal rate and regular rhythm.     Heart sounds: Normal heart sounds. No murmur heard. Pulmonary:     Effort: Pulmonary effort is normal.     Breath sounds: Normal breath sounds.  Chest:     Chest wall: Tenderness present.  Abdominal:     Palpations: Abdomen is soft.     Tenderness: There is no abdominal tenderness.     Comments: Palpation of her abdomen reproduces pain in the left side of the chest  Musculoskeletal:     Right lower leg: No tenderness. No edema.     Left lower leg: No tenderness. No edema.  Skin:    General: Skin is warm and dry.     Findings: No rash.  Neurological:  Mental Status: She is alert and oriented to person, place, and time.  Psychiatric:        Behavior: Behavior normal.    ED Results / Procedures / Treatments   Labs (all labs ordered are listed, but only abnormal results are displayed) Labs Reviewed  BASIC METABOLIC PANEL - Abnormal; Notable for the following components:      Result Value   BUN 28 (*)    Creatinine, Ser 1.31 (*)    GFR, Estimated 44 (*)    All other components within normal limits  CBG MONITORING, ED - Abnormal; Notable for the following components:   Glucose-Capillary 102 (*)    All other components within normal limits  CBC  TROPONIN I (HIGH SENSITIVITY)  TROPONIN I (HIGH SENSITIVITY)    EKG EKG Interpretation  Date/Time:  Thursday October 16 2021 10:32:46 EDT Ventricular Rate:  79 PR Interval:  187 QRS Duration: 151 QT Interval:  432 QTC Calculation: 496 R Axis:   91 Text Interpretation: Sinus rhythm Left bundle branch block old lbbb compared to January 2023 Confirmed by Isla Pence [id number]) on 10/16/2021 11:27:01 AM  Radiology DG Chest Port 1 View  Result Date: 10/16/2021 CLINICAL DATA:  Chest pain EXAM: PORTABLE CHEST 1 VIEW COMPARISON:  Radiograph 09/23/2018 FINDINGS: Unchanged cardiomediastinal silhouette.  There is no focal airspace consolidation. There is no pleural effusion. No pneumothorax. Bilateral shoulder degenerative changes. There is no acute osseous abnormality. IMPRESSION: No evidence of acute cardiopulmonary disease. Electronically Signed   By: Maurine Simmering M.D.   On: 10/16/2021 11:03    Procedures Procedures    Medications Ordered in ED Medications  lidocaine (LIDODERM) 5 % 1 patch (1 patch Transdermal Patch Applied 10/16/21 1316)  HYDROmorphone (DILAUDID) injection 0.5 mg (has no administration in time range)  morphine (PF) 4 MG/ML injection 4 mg (4 mg Intravenous Given 10/16/21 1148)  ondansetron (ZOFRAN) injection 4 mg (4 mg Intravenous Given 10/16/21 1143)  HYDROcodone-acetaminophen (NORCO/VICODIN) 5-325 MG per tablet 1 tablet (1 tablet Oral Given 10/16/21 1316)    ED Course/ Medical Decision Making/ A&P                           Medical Decision Making Amount and/or Complexity of Data Reviewed Labs: ordered. Radiology: ordered.  Risk Prescription drug management.   This patient presents to the ED for concern of left side chest pain, this involves an extensive number of treatment options, and is a complaint that carries with it a high risk of complications and morbidity.  The differential diagnosis includes ACS, shingles, GERD, arrhythmia, anxiety    Co morbidities that complicate the patient evaluation  COPD, HTN, fibromyalgia, anxiety, CHF (EF 55-60%), GERD, hyperlipidemia, PAD   Additional history obtained:  Additional history obtained from EMS, reports giving 35 mg aspirin and 1 nitro, concern for left bundle branch block on EKG (unchanged from prior on file from January 2023) External records from outside source obtained and reviewed including last echo on file from November 2018 with EF of 55 to 60%   Lab Tests:  I Ordered, and personally interpreted labs.  The pertinent results include: CBC normal limits, BMP with slightly elevated creatinine and BUN compared  to prior on file, creatinine 1.31 today, previously 1.18.  Initial troponin is 5, repeat is 4   Imaging Studies ordered:  I ordered imaging studies including CXR  I independently visualized and interpreted imaging which showed no acute disease I agree with the radiologist  interpretation   Cardiac Monitoring: / EKG:  The patient was maintained on a cardiac monitor.  I personally viewed and interpreted the cardiac monitored which showed an underlying rhythm of: Sinus rhythm, rate 79   Consultations Obtained:  I requested consultation with the ER attending, Dr. Gilford Raid,  and discussed lab and imaging findings as well as pertinent plan - they recommend: Pain medication, can likely discharge home after second troponin   Problem List / ED Course / Critical interventions / Medication management  71 year old female with complaint of chest pain as above.  On exam, is found have exquisite tenderness in the left side of her chest.  EKG unchanged from prior.  Initial lab work reassuring, pending repeat troponin. Repeat troponin is unchanged.  Results reviewed with patient and family member at bedside.  Patient notes no improvement in her pain despite morphine, Norco, Lidoderm.  Offered reassurance based on today's work-up.  Encouraged to follow-up with her primary care provider and see her cardiologist. Plan is to give Dilauda prior to discharge.  I ordered medication including morphine, Zofran for chest wall pain Reevaluation of the patient after these medicines showed that the patient stayed the same.  Reports morphine has never helped with her pain.  Has not had any relief with the hydrocodone.  Is requesting Dilauda before discharge.  I have reviewed the patients home medicines and have made adjustments as needed   Social Determinants of Health:  Has PCP as well as cardiology for follow up   Test / Admission - Considered:  Troponin unchanged, EKG unchanged. Patient felt to be stable for  dc to follow up with her cardiologist.          Final Clinical Impression(s) / ED Diagnoses Final diagnoses:  Nonspecific chest pain    Rx / DC Orders ED Discharge Orders     None         Tacy Learn, PA-C 10/16/21 1416    Isla Pence, MD 10/16/21 1550

## 2021-10-16 NOTE — ED Notes (Signed)
Pt given crackers and water. 

## 2021-10-16 NOTE — ED Notes (Signed)
RN reviewed discharge instructions w/ pt. Follow up and pain management reviewed, pt had no further questions. 

## 2021-10-16 NOTE — ED Triage Notes (Signed)
Pt BIB EMS due to chest pain. Pt was on the weay to her pain clinic appt and started having CP that radiates down her shoulder and back. Pt is axox4. Pt received '324mg'$  of Asprin and a nitro. EMS states pt had Left bundle branch on EKG which could be new. VSS.

## 2021-10-16 NOTE — Discharge Instructions (Addendum)
Follow-up with your cardiologist.  Return to the emergency room for worsening or concerning symptoms.

## 2021-10-16 NOTE — ED Notes (Signed)
Pt requested CBG check, states her sugar can drop fast.

## 2021-11-10 IMAGING — DX DG CHEST 2V
2 series · 2 of 2 positions shown · non-contrast
Comparison: Chest radiograph dated 02/05/2016

CLINICAL DATA: Chest pain

EXAM:
CHEST - 2 VIEW

[chest pa]
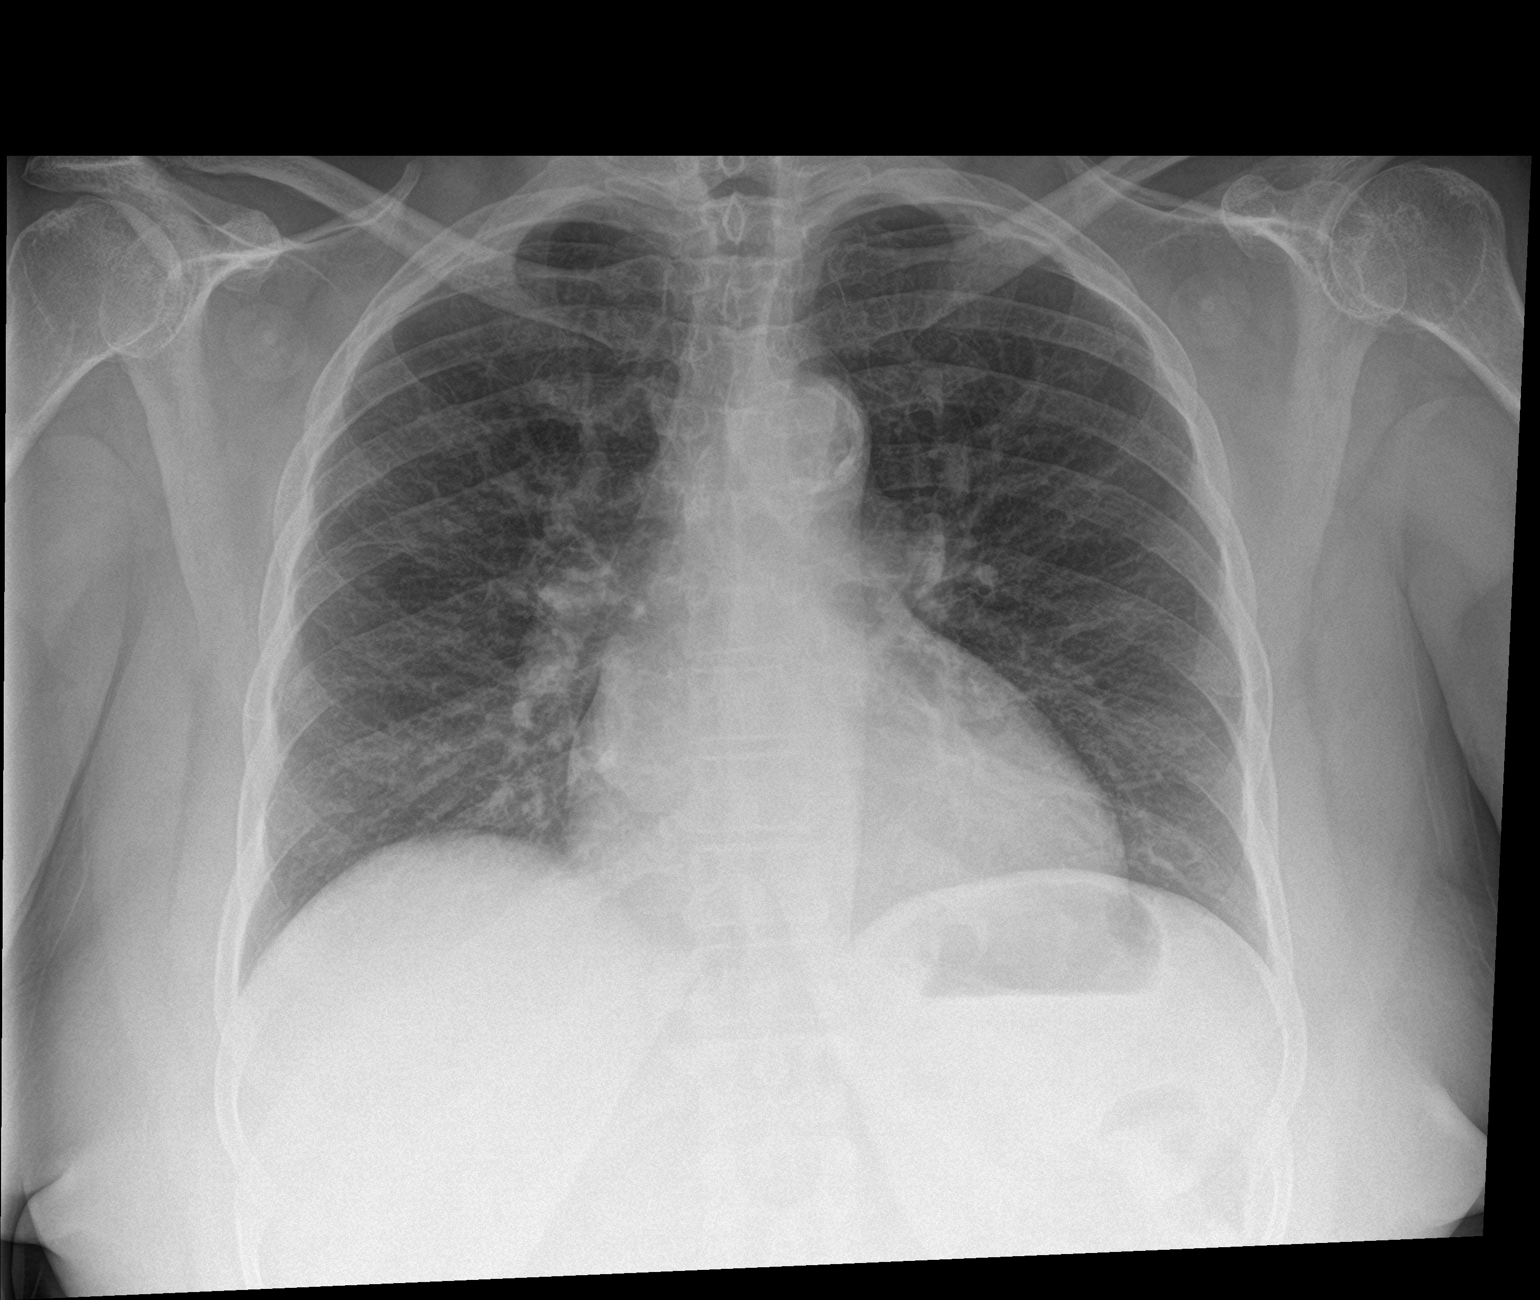

[chest lat]
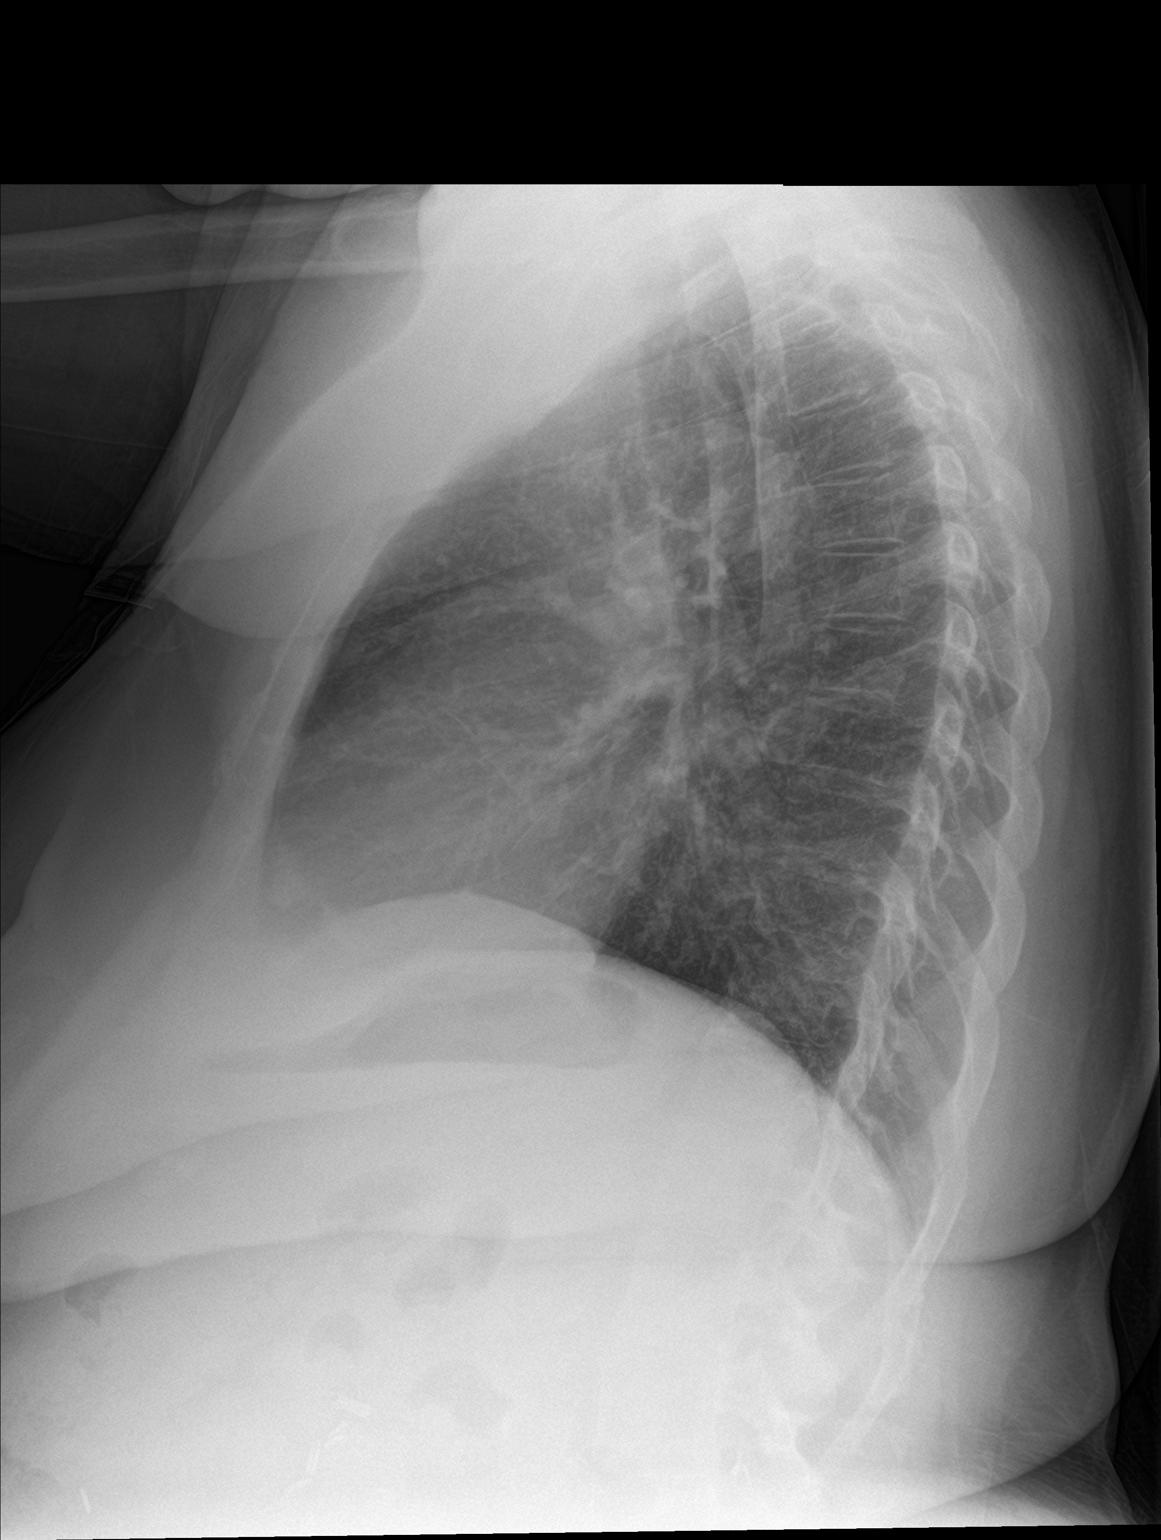

[2 of 2 positions shown; findings below may reference images not displayed]

FINDINGS: The heart size is within normal limits. Vascular calcifications are
seen in the aortic arch. Both lungs are clear. The visualized
skeletal structures are unremarkable.
IMPRESSION: No active cardiopulmonary disease.

## 2021-12-11 ENCOUNTER — Telehealth: Payer: Self-pay | Admitting: Cardiology

## 2021-12-11 NOTE — Telephone Encounter (Signed)
I have not seen her since the ED visit. Please arrange a visit with me today or tomorrow.  Thanks MJP

## 2021-12-11 NOTE — Telephone Encounter (Signed)
Dr. Bertis Ruddy office says patient is having surgery on her right hand/wrist for carpal tunnel next week. Dr. Charlotte Crumb will be doing the procedure, they're asking for a clearance in order to proceed because the patient was in the ED on 10/16/21 for chest pain.

## 2021-12-12 ENCOUNTER — Ambulatory Visit: Payer: Medicare Other | Admitting: Cardiology

## 2021-12-12 ENCOUNTER — Encounter: Payer: Self-pay | Admitting: Cardiology

## 2021-12-12 VITALS — BP 146/78 | HR 73 | Resp 16 | Ht 62.0 in | Wt 238.0 lb

## 2021-12-12 DIAGNOSIS — Z01810 Encounter for preprocedural cardiovascular examination: Secondary | ICD-10-CM | POA: Insufficient documentation

## 2021-12-12 DIAGNOSIS — E782 Mixed hyperlipidemia: Secondary | ICD-10-CM

## 2021-12-12 DIAGNOSIS — I739 Peripheral vascular disease, unspecified: Secondary | ICD-10-CM

## 2021-12-12 DIAGNOSIS — I6523 Occlusion and stenosis of bilateral carotid arteries: Secondary | ICD-10-CM

## 2021-12-12 IMAGING — CR DG CERVICAL SPINE 2 OR 3 VIEWS
3 series · 3 of 3 positions shown · non-contrast
Comparison: None.

CLINICAL DATA: Cervicalgia

EXAM:
CERVICAL SPINE - 2-3 VIEW

[w c-spine lat]
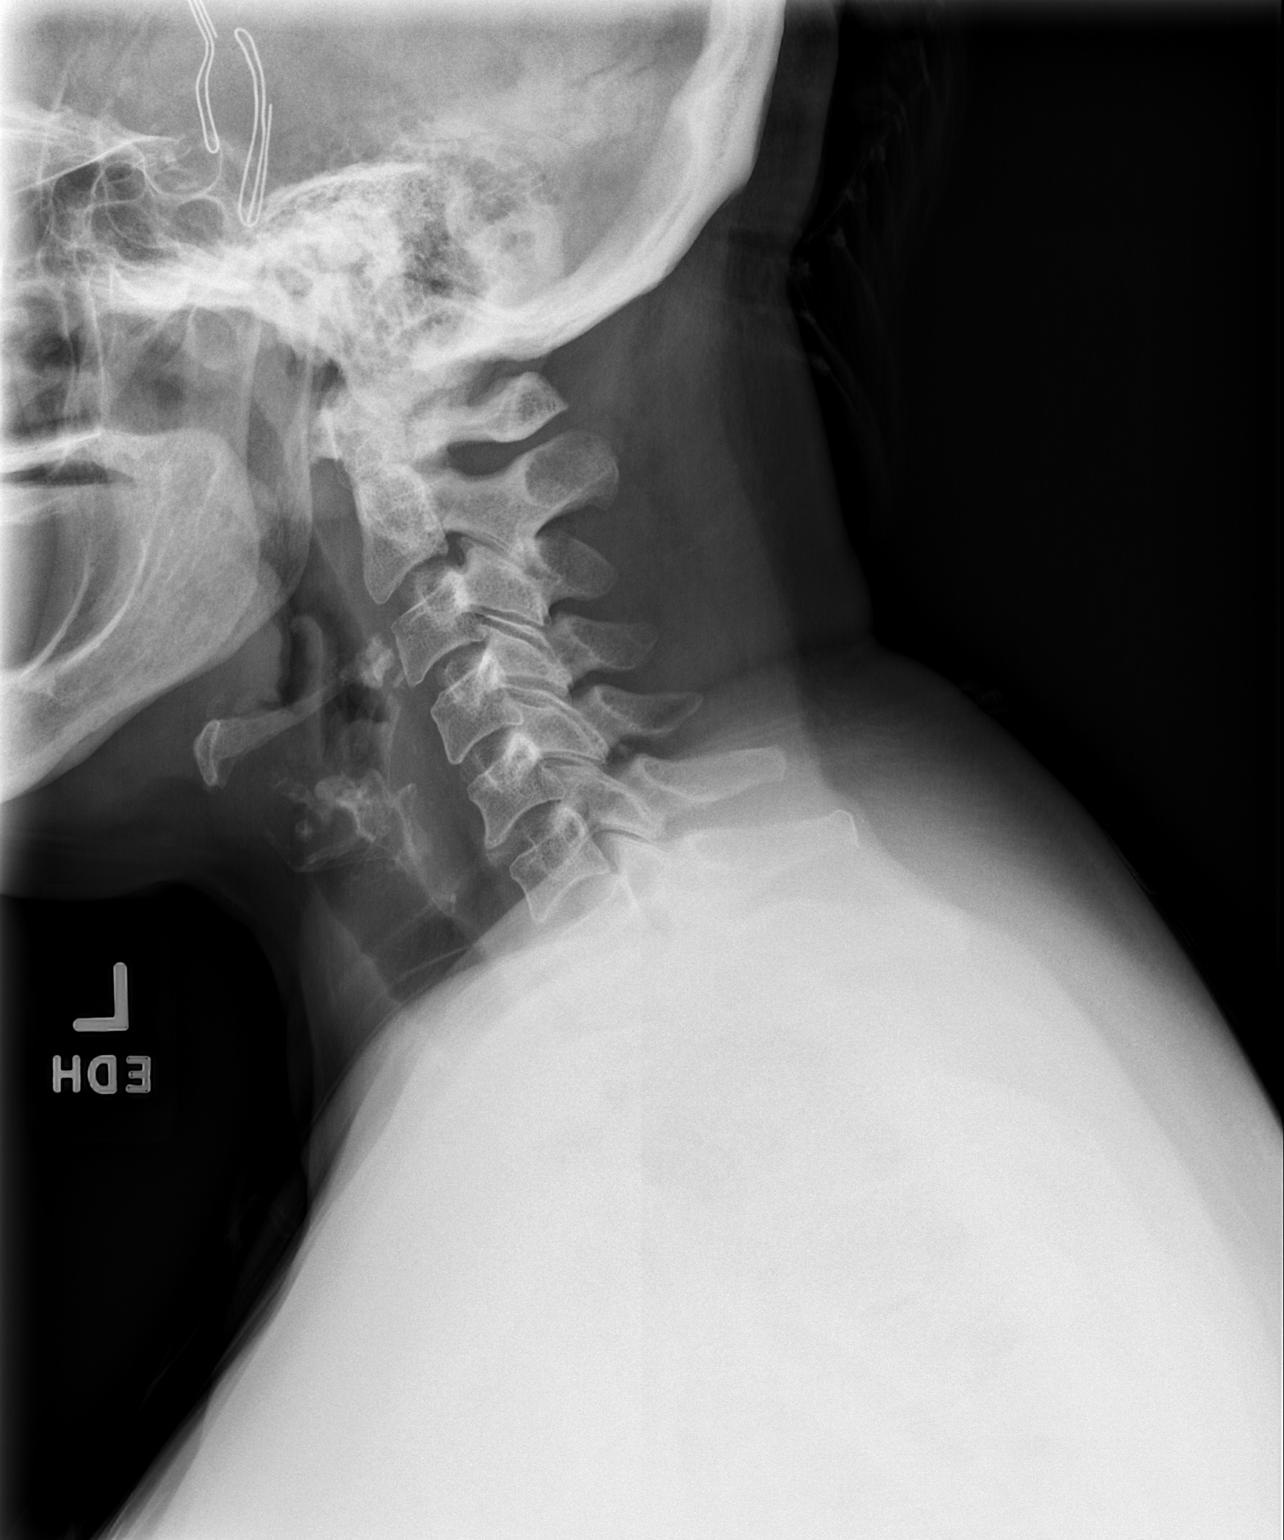

[w c-spine a.p. *]
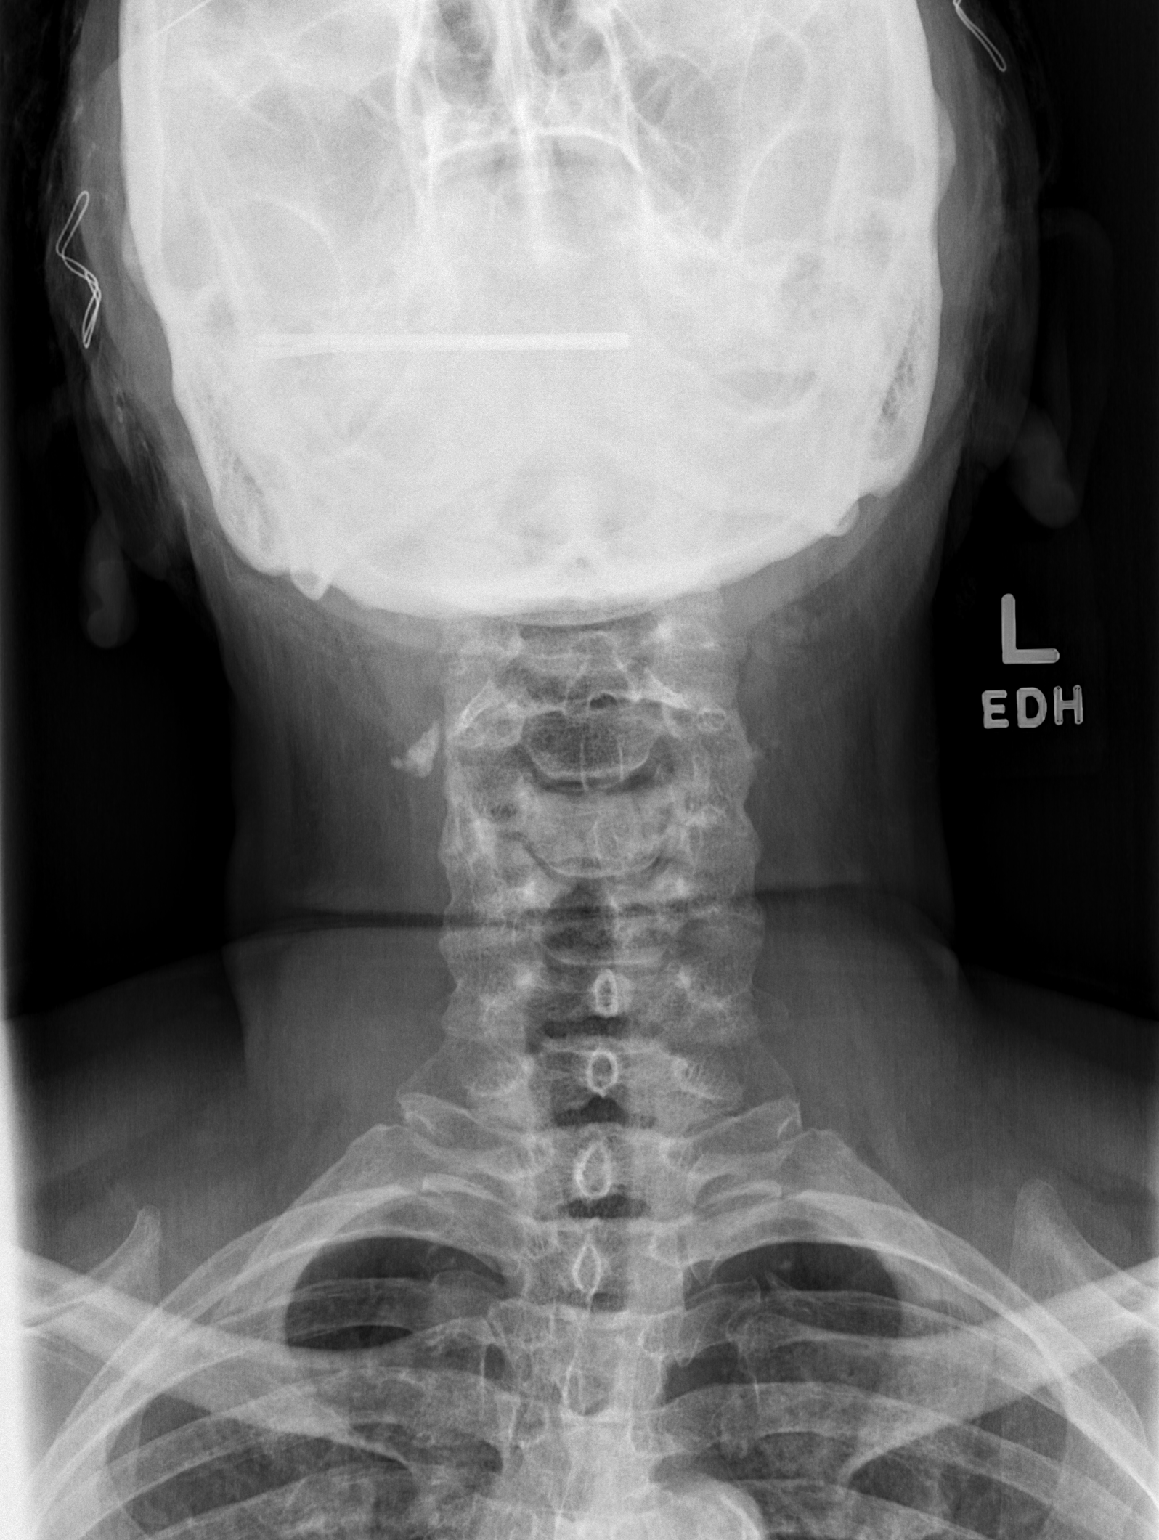

[w c-spine odontoid *]
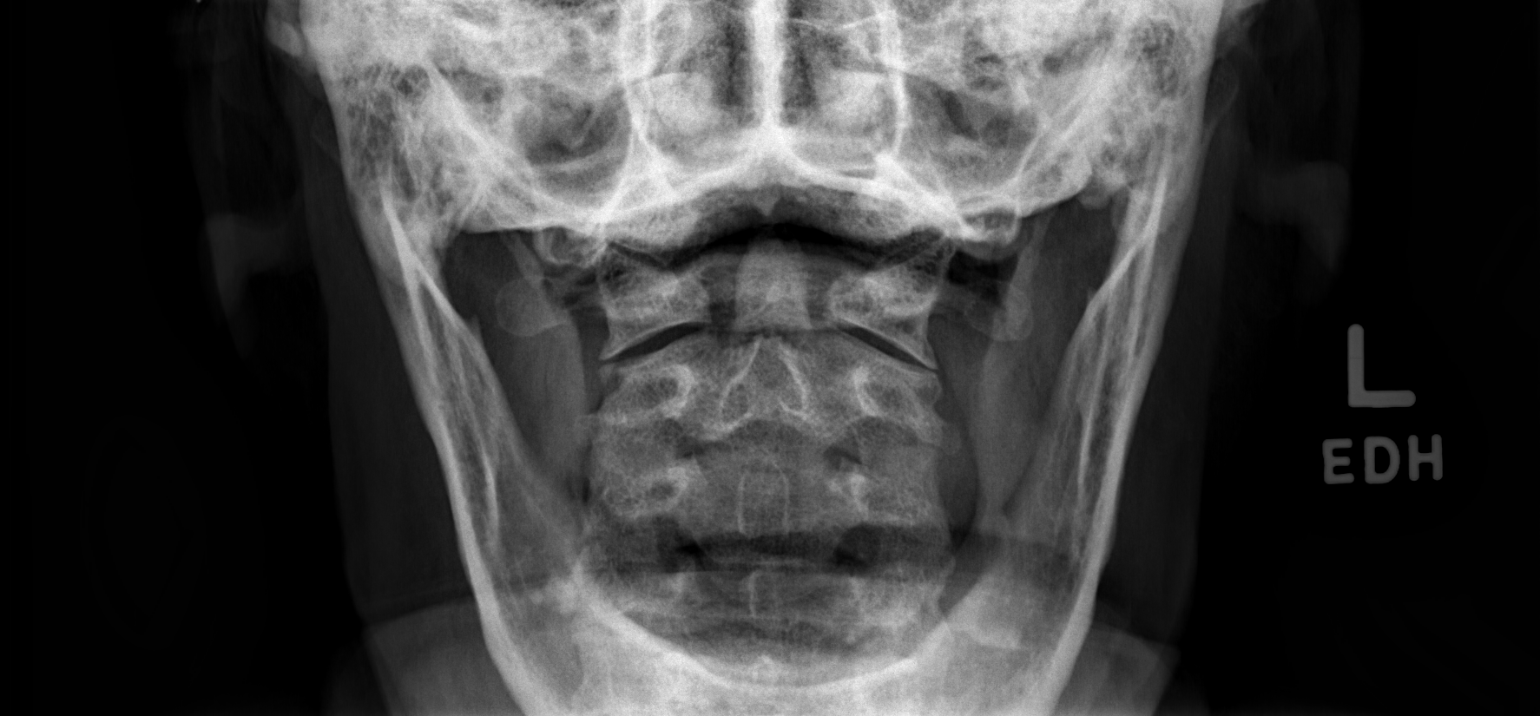

[3 of 3 positions shown; findings below may reference images not displayed]

FINDINGS: Frontal, lateral, and open-mouth odontoid images were obtained.
There is no fracture or spondylolisthesis. Prevertebral soft tissues
and predental space regions are normal. The disc spaces appear
normal. There is relative lack of lordosis.

There is calcification in the right carotid artery. Lung apices are
clear.
IMPRESSION: No fracture or spondylolisthesis. No appreciable arthropathy.
Relative lack of lordosis raises question of a degree of muscle
spasm.

There is right carotid artery calcification.

## 2021-12-12 NOTE — Telephone Encounter (Signed)
Pt needs an appt

## 2021-12-12 NOTE — Progress Notes (Signed)
Error

## 2021-12-12 NOTE — Progress Notes (Signed)
Patient referred by [name], NP for carotid artery calcification  Subjective:   Christina Solis, female    DOB: 1950/11/18, 71 y.o.   MRN: [id number]   Chief Complaint  Patient presents with   Asymptomatic bilateral carotid artery stenosis   Medical Clearance    RT wrist surgery     HPI  71 y/o [place] American female with hypertension, hyperlipidemia, prior tobacco abuse, mod carotid artery stenosis, PAD  Patient was seen in [facility] ER on 10/16/2021 with complaints of chest pain. ACS was ruled out. Pain was pleuritic in left lower part of her chest/ This was later attributed to muscle spasm and improved after adjustment of her pain medications. She stays active with walking etc and has not had any recurrence of chest pain since then. Blood pressure elevated today, but usually well controlled.    Current Outpatient Medications:    albuterol (PROVENTIL) (2.5 MG/3ML) 0.083% nebulizer solution, Take 2.5 mg by nebulization every 6 (six) hours as needed for wheezing or shortness of breath.  (Patient not taking: Reported on 10/16/2021), Disp: , Rfl:    albuterol (VENTOLIN HFA) 108 (90 Base) MCG/ACT inhaler, Inhale 1 puff into the lungs every 6 (six) hours as needed for wheezing or shortness of breath. ProAir (Patient not taking: Reported on 10/16/2021), Disp: , Rfl:    benzonatate (TESSALON) 200 MG capsule, Take by mouth., Disp: , Rfl:    budesonide-formoterol (SYMBICORT) 160-4.5 MCG/ACT inhaler, Inhale 2 puffs into the lungs 2 (two) times daily. (Patient not taking: Reported on 10/16/2021), Disp: , Rfl:    cetirizine-pseudoephedrine (ZYRTEC-D) 5-120 MG tablet, Take 1 tablet by mouth daily. (Patient not taking: Reported on 10/16/2021), Disp: 15 tablet, Rfl: 0   clopidogrel (PLAVIX) 75 MG tablet, Take 75 mg by mouth daily. , Disp: , Rfl: 1   dapagliflozin propanediol (FARXIGA) 10 MG TABS tablet, Take 1 tablet (10 mg total) by mouth daily before breakfast., Disp: 90 tablet, Rfl: 3   diclofenac  Sodium (VOLTAREN) 1 % GEL, Apply topically., Disp: , Rfl:    dicyclomine (BENTYL) 20 MG tablet, Take 20 mg by mouth 4 (four) times daily as needed for spasms.  (Patient not taking: Reported on 10/16/2021), Disp: , Rfl:    ferrous sulfate 325 (65 FE) MG tablet, Take 325 mg by mouth daily with breakfast. , Disp: , Rfl:    HUMALOG MIX 75/25 KWIKPEN (75-25) 100 UNIT/ML KwikPen, Inject 22 Units into the skin daily with breakfast AND 20 Units daily with supper. (Patient taking differently: Inject 22 Units into the skin daily with breakfast AND 24 Units daily with supper.), Disp: 45 mL, Rfl: 3   icosapent Ethyl (VASCEPA) 1 g capsule, Take 2 capsules (2 g total) by mouth 2 (two) times daily. (Patient not taking: Reported on 10/16/2021), Disp: 360 capsule, Rfl: 2   Insulin Pen Needle 32G X 4 MM MISC, 1 Device by Does not apply route in the morning and at bedtime., Disp: 200 each, Rfl: 3   lisinopril-hydrochlorothiazide (PRINZIDE,ZESTORETIC) 20-12.5 MG per tablet, Take 1 tablet by mouth daily. , Disp: , Rfl:    lubiprostone (AMITIZA) 24 MCG capsule, Take 24 mcg by mouth 2 (two) times daily with a meal., Disp: , Rfl:    mometasone (NASONEX) 50 MCG/ACT nasal spray, Place 2 sprays into the nose as needed., Disp: , Rfl:    Multiple Vitamin (MULTIVITAMIN) capsule, Take 1 capsule by mouth daily., Disp: , Rfl:    naproxen (NAPROSYN) 250 MG tablet, Take 250  mg by mouth 2 (two) times daily., Disp: , Rfl:    Olopatadine HCl 0.2 % SOLN, Apply 1 drop to eye daily. (Patient taking differently: Apply 1 drop to eye as needed.), Disp: 2.5 mL, Rfl: 0   omeprazole (PRILOSEC) 40 MG capsule, TAKE 1 CAPSULE(40 MG) BY MOUTH DAILY, Disp: 90 capsule, Rfl: 2   oxyCODONE-acetaminophen (PERCOCET) 10-325 MG tablet, Take 1 tablet by mouth every 4 (four) hours as needed., Disp: , Rfl:    pregabalin (LYRICA) 200 MG capsule, Take 200 mg by mouth 3 (three) times daily., Disp: , Rfl:    promethazine (PHENERGAN) 25 MG tablet, Take 25 mg by mouth  every 6 (six) hours as needed for nausea or vomiting. , Disp: , Rfl:    rosuvastatin (CRESTOR) 20 MG tablet, Take 1 tablet (20 mg total) by mouth daily., Disp: 90 tablet, Rfl: 3   SPIRIVA RESPIMAT 2.5 MCG/ACT AERS, INHALE 2 PUFFS INTO THE LUNGS DAILY, Disp: 4 g, Rfl: 5   tiZANidine (ZANAFLEX) 4 MG tablet, Take 4 mg by mouth 3 (three) times daily as needed., Disp: , Rfl:    Vitamin D, Ergocalciferol, (DRISDOL) 50000 UNITS CAPS capsule, Take 50,000 Units by mouth every Tuesday.  (Patient not taking: Reported on 10/16/2021), Disp: , Rfl:   Cardiovascular and other pertinent studies:  EKG 12/12/2021: Sinus rhythm 73 bpm Left bundle branch block  Carotid artery duplex 02/04/2021:  Duplex suggests stenosis in the right internal carotid artery (50-69%).  Duplex suggests stenosis in the right external carotid artery (<50%).  Duplex suggests stenosis in the left internal carotid artery (50-69%).  Duplex suggests stenosis in the left external carotid artery (<50%).  Antegrade right vertebral artery flow. Antegrade left vertebral artery  flow.  Compared to the study done on 09/10/2020, no significant change. Follow up  in six months is appropriate if clinically indicated.  ABI 02/28/2020:  This exam reveals moderately decreased perfusion of the right lower  extremity, noted at the dorsalis pedis and post tibial artery level (ABI  0.75) and normal perfusion of the left lower extremity (ABI 1.00).  There is mildly abnormal biphasic waveform at the ankles.  Recent labs: 10/16/2021: Glucose 94, BUN/Cr 28/1.31. EGFR 44. Na/K 141/4.4.  H/H 12/39. MCV 96. Platelets 225 Trop HS 5,4  02/21/2021: Glucose 94, BUN/Cr 24/1.15. EGFR 48. Na/K 142/3.8.  H/H 12/39. MCV 94. Platelets 250 Chol 121, TG 297, HDL 21, LDL 41 HbA1C 6.8%   Review of Systems  Cardiovascular:  Negative for chest pain, dyspnea on exertion, leg swelling, palpitations and syncope.         Vitals:   12/12/21 1123  BP: (!) 146/78   Pulse: 73  Resp: 16  SpO2: 98%     Body mass index is 43.53 kg/m. Filed Weights   12/12/21 1123  Weight: 238 lb (108 kg)     Objective:   Physical Exam Vitals and nursing note reviewed.  Constitutional:      General: She is not in acute distress. Neck:     Vascular: No JVD.  Cardiovascular:     Rate and Rhythm: Normal rate and regular rhythm.     Pulses:          Femoral pulses are 0 on the right side and 0 on the left side.      Popliteal pulses are 1+ on the right side and 1+ on the left side.     Heart sounds: Normal heart sounds. No murmur heard. Pulmonary:     Effort:  Pulmonary effort is normal.     Breath sounds: Normal breath sounds. No wheezing or rales.  Musculoskeletal:     Right lower leg: No edema.     Left lower leg: No edema.            Assessment & Recommendations:   71 y/o [place] American female with hypertension, hyperlipidemia, prior tobacco abuse, mod carotid artery stenosis, PAD  Chest pain: Atypical, resolved.  While she has risk factors for CAD, suspicion is low for pain June to have been angina.  She may proceed with upcoming wrist surgery with low cardiac risk.   Carotid artery stenosis: Mod b/l carotid artery stenoses. Asymptomatic. Continue Plavix, statin Carotid duplex in 04/2022.  PAD: No claudication. Continue medical management. Will check LEA duplex in 04/2022.  Upcoming lipid panel labs with PCP next week. Will follow up.   F/u in 05/2022     Nigel Mormon, MD Pager: [phone] Office: [phone]

## 2021-12-17 ENCOUNTER — Other Ambulatory Visit: Payer: Self-pay

## 2021-12-30 ENCOUNTER — Encounter: Payer: Self-pay | Admitting: Internal Medicine

## 2021-12-30 ENCOUNTER — Ambulatory Visit (INDEPENDENT_AMBULATORY_CARE_PROVIDER_SITE_OTHER): Payer: Medicare Other | Admitting: Internal Medicine

## 2021-12-30 VITALS — BP 124/72 | HR 83 | Ht 62.0 in | Wt 234.0 lb

## 2021-12-30 DIAGNOSIS — E782 Mixed hyperlipidemia: Secondary | ICD-10-CM | POA: Diagnosis not present

## 2021-12-30 DIAGNOSIS — E1159 Type 2 diabetes mellitus with other circulatory complications: Secondary | ICD-10-CM | POA: Diagnosis not present

## 2021-12-30 DIAGNOSIS — D352 Benign neoplasm of pituitary gland: Secondary | ICD-10-CM

## 2021-12-30 DIAGNOSIS — E1142 Type 2 diabetes mellitus with diabetic polyneuropathy: Secondary | ICD-10-CM

## 2021-12-30 DIAGNOSIS — Z794 Long term (current) use of insulin: Secondary | ICD-10-CM | POA: Diagnosis not present

## 2021-12-30 DIAGNOSIS — E1122 Type 2 diabetes mellitus with diabetic chronic kidney disease: Secondary | ICD-10-CM | POA: Diagnosis not present

## 2021-12-30 DIAGNOSIS — N1831 Chronic kidney disease, stage 3a: Secondary | ICD-10-CM

## 2021-12-30 LAB — BASIC METABOLIC PANEL
BUN: 22 mg/dL (ref 6–23)
CO2: 28 mEq/L (ref 19–32)
Calcium: 9.2 mg/dL (ref 8.4–10.5)
Chloride: 106 mEq/L (ref 96–112)
Creatinine, Ser: 1.16 mg/dL (ref 0.40–1.20)
GFR: 48.06 mL/min — ABNORMAL LOW (ref >60.00)
Glucose, Bld: 93 mg/dL (ref 70–99)
Potassium: 3.9 mEq/L (ref 3.5–5.1)
Sodium: 139 mEq/L (ref 135–145)

## 2021-12-30 LAB — POCT GLYCOSYLATED HEMOGLOBIN (HGB A1C): Hemoglobin A1C: 7.6 % — AB (ref 4.0–5.6)

## 2021-12-30 LAB — LIPID PANEL
Cholesterol: 145 mg/dL (ref 0–200)
HDL: 28 mg/dL — ABNORMAL LOW (ref >39.00)
LDL Cholesterol: 85 mg/dL (ref 0–99)
NonHDL: 117.01
Total CHOL/HDL Ratio: 5
Triglycerides: 158 mg/dL — ABNORMAL HIGH (ref 0.0–149.0)
VLDL: 31.6 mg/dL (ref 0.0–40.0)

## 2021-12-30 LAB — T4, FREE: Free T4: 0.66 ng/dL (ref 0.60–1.60)

## 2021-12-30 LAB — TSH: TSH: 2.36 u[IU]/mL (ref 0.35–5.50)

## 2021-12-30 MED ORDER — SEMAGLUTIDE(0.25 OR 0.5MG/DOS) 2 MG/3ML ~~LOC~~ SOPN: 0.5000 mg | PEN_INJECTOR | SUBCUTANEOUS | 2 refills | Status: DC

## 2021-12-30 NOTE — Progress Notes (Unsigned)
Name: Christina Solis  Age/ Sex: 71 y.o., female   MRN/ DOB: [id number], 15-Mar-1951     PCP: [name], NP   Reason for Endocrinology Evaluation: Type 2 Diabetes Mellitus  Initial Endocrine Consultative Visit:  05/03/2019    PATIENT IDENTIFIER: Christina Solis is a 71 y.o. female with a past medical history of HTN, COPD, and T2DM. The patient has followed with Endocrinology clinic since 05/03/2019 for consultative assistance with management of her diabetes.  DIABETIC HISTORY:  Ms. Duchemin was diagnosed with T2 DM in 2008 .She was on metformin that was stopped due to CKD, has been on insulin since 2009. Her hemoglobin A1c has ranged from 6.9% in 2015, peaking at 7.4% in 2020.  On her initial visit to our clinic she had an A1c of 7.4%, she was on Humalog mix, which was continued and adjusted.  PITUITARY TUMOR :  She was first diagnosed with pituitary adenoma in 2006 . She was put on medication for ~ 8 month but when her level normalized ,the medicine was stopped. She had galactorrhea up to 2018.  Repeat prolactin in 2022 was normal with appropriately elevated FSH and normal IGF  Prolactin was elevated in 2023   She is a retired Marine scientist Lives with daughter SUBJECTIVE:   During the last visit (06/25/2021): A1c 6.8% . We continued insulin mix  increased farxiga      Today (12/30/2021): Ms. Burdin is here for follow-up on diabetes management. She checks her blood sugars 3 x a day  The patient has not had hypoglycemic episodes since the last clinic visit.    She was evaluated by cardiology due to carotid artery calcifications in 05/2020, medical management with Plavix and statins recommended.   She has been following with Ortho for a ganglion cyst  Denies nausea, vomiting or diarrhea She has an episode of  galactorrhea back in December, 2022 but none recently. No mastalgia  She has received multiple steroid injections in the back and shoulders for the past month  She had GERD 3  yrs ago, but now rarely   HOME DIABETES REGIMEN:  Humalog mix 22 units with breakfast and 20 units with supper Farxiga 10 mg daily     Statin: Yes ACE-I/ARB: Yes    METER DOWNLOAD SUMMARY: Date range evaluated: 8/2-8/15/2023 Average Number Tests = 2 Overall Mean FS Glucose = 182   BG Ranges Low = 142 High = 221   Hypoglycemic Events/30 Days: BG < 50 = 0 Episodes of symptomatic severe hypoglycemia = 0    DIABETIC COMPLICATIONS: Microvascular complications:  Neuropathy, CKD III, right eye cataract  Denies: retinopathy Last eye exam: 11/2021    Macrovascular complications:  CHF, PVD Denies: CAD, CVA    HISTORY:  Past Medical History:  Past Medical History:  Diagnosis Date   Anemia    sickle cell trait   Anxiety    Arthritis    "knees" (10/12/2013)   CHF (congestive heart failure) (HCC)    Chronic back pain    Chronic bronchitis ([place])    "got it q yr when I lived in [place]"   Complication of anesthesia    hard to put under   COPD (chronic obstructive pulmonary disease) ([place])    Dysrhythmia    Family history of anesthesia complication    sister hard to wake up   Family history of anesthesia complication    niece have itching   Fibromyalgia    GERD (gastroesophageal reflux disease)  Hyperlipidemia    Hypertension    Irregular heart beat    Migraine    "used to get them alot; not regular anymore" (10/12/2013)   PAD (peripheral artery disease) [facility])    Pituitary tumor    followed at [facility] Endocr[facility]ology; on cabergoline 09/2013   Pneumonia    "twice"   Type II diabetes mellitus ([place])    Past Surgical History:  Past Surgical History:  Procedure Laterality Date   ABDOMINAL AORTAGRAM N/A 06/28/2012   Procedure: ABDOMINAL Maxcine Ham;  Surgeon: Laverda Page, MD;  Location: [facility] CATH LAB;  Service: Cardiovascular;  Laterality: N/A;   APPENDECTOMY     BACK SURGERY     x 2, lower back   BILATERAL CARPAL TUNNEL RELEASE     CARDIAC CATHETERIZATION      CESAREAN SECTION  1974; 1976; [place] N/A 10/12/2013   Procedure: LAPAROSCOPIC CHOLECYSTECTOMY WITH INTRAOPERATIVE CHOLANGIOGRAM;  Surgeon: [name]. Georgette Dover, MD;  Location: [facility];  Service: General;  Laterality: N/A;   COLONOSCOPY     FEMORAL ARTERY STENT Left 06/28/2012   SFA   LAPAROSCOPIC CHOLECYSTECTOMY  10/12/2013   LEFT HEART CATHETERIZATION WITH CORONARY ANGIOGRAM N/A 06/28/2012   Procedure: LEFT HEART CATHETERIZATION WITH CORONARY ANGIOGRAM;  Surgeon: Laverda Page, MD;  Location: [facility] CATH LAB;  Service: Cardiovascular;  Laterality: N/A;   LOWER EXTREMITY ANGIOGRAM N/A 06/28/2012   Procedure: LOWER EXTREMITY ANGIOGRAM;  Surgeon: Laverda Page, MD;  Location: [facility] CATH LAB;  Service: Cardiovascular;  Laterality: N/A;   PERIPHERAL VASCULAR CATHETERIZATION N/A 09/10/2015   Procedure: Abdominal Aortogram w/Lower Extremity;  Surgeon: Adrian Prows, MD;  Location: [facility] CV LAB;  Service: Cardiovascular;  Laterality: N/A;   POSTERIOR LUMBAR FUSION     REPLACEMENT TOTAL KNEE Left    TONSILLECTOMY     TUBAL LIGATION  1978   Social History:  reports that she quit smoking about 6 years ago. Her smoking use included cigarettes. She started smoking about 21 years ago. She has a 23.00 pack-year smoking history. She has never used smokeless tobacco. She reports that she does not drink alcohol and does not use drugs. Family History:  Family History  Problem Relation Age of Onset   Diabetes Mother    Kidney disease Mother    Cancer Father    Pancreatic cancer Father    Kidney disease Brother    Cancer Brother    Colon cancer Neg Hx    Inflammatory bowel disease Neg Hx    Esophageal cancer Neg Hx    Liver disease Neg Hx    Rectal cancer Neg Hx    Stomach cancer Neg Hx      HOME MEDICATIONS: Allergies as of 12/30/2021       Reactions   Coconut (cocos Nucifera) Hives        Medication List        Accurate as of December 30, 2021  3:48 PM. If you have any questions,  ask your nurse or doctor.          albuterol 108 (90 Base) MCG/ACT inhaler Commonly known as: VENTOLIN HFA Inhale 1 puff into the lungs every 6 (six) hours as needed for wheezing or shortness of breath. ProAir   albuterol (2.5 MG/3ML) 0.083% nebulizer solution Commonly known as: PROVENTIL Take 2.5 mg by nebulization every 6 (six) hours as needed for wheezing or shortness of breath.   budesonide-formoterol 160-4.5 MCG/ACT inhaler Commonly known as: SYMBICORT Inhale 2 puffs into the lungs 2 (  two) times daily.   cetirizine-pseudoephedrine 5-120 MG tablet Commonly known as: ZYRTEC-D Take 1 tablet by mouth daily.   clopidogrel 75 MG tablet Commonly known as: PLAVIX Take 75 mg by mouth daily.   dapagliflozin propanediol 10 MG Tabs tablet Commonly known as: Farxiga Take 1 tablet (10 mg total) by mouth daily before breakfast.   diclofenac Sodium 1 % Gel Commonly known as: VOLTAREN Apply topically.   dicyclomine 20 MG tablet Commonly known as: BENTYL Take 20 mg by mouth 4 (four) times daily as needed for spasms.   ferrous sulfate 325 (65 FE) MG tablet Take 325 mg by mouth daily with breakfast.   HumaLOG Mix 75/25 KwikPen (75-25) 100 UNIT/ML Kwikpen Generic drug: Insulin Lispro Prot & Lispro Inject 22 Units into the skin daily with breakfast AND 20 Units daily with supper. What changed: See the new instructions.   icosapent Ethyl 1 g capsule Commonly known as: VASCEPA Take 2 capsules (2 g total) by mouth 2 (two) times daily.   Insulin Pen Needle 32G X 4 MM Misc 1 Device by Does not apply route in the morning and at bedtime.   lisinopril-hydrochlorothiazide 20-12.5 MG tablet Commonly known as: ZESTORETIC Take 1 tablet by mouth daily.   lubiprostone 24 MCG capsule Commonly known as: AMITIZA Take 24 mcg by mouth 2 (two) times daily with a meal.   mometasone 50 MCG/ACT nasal spray Commonly known as: NASONEX Place 2 sprays into the nose as needed.   multivitamin  capsule Take 1 capsule by mouth daily.   naproxen 250 MG tablet Commonly known as: NAPROSYN Take 250 mg by mouth 2 (two) times daily.   Olopatadine HCl 0.2 % Soln Apply 1 drop to eye daily. What changed:  when to take this reasons to take this   omeprazole 40 MG capsule Commonly known as: PRILOSEC TAKE 1 CAPSULE(40 MG) BY MOUTH DAILY   oxyCODONE-acetaminophen 10-325 MG tablet Commonly known as: PERCOCET Take 1 tablet by mouth every 4 (four) hours as needed.   pregabalin 200 MG capsule Commonly known as: LYRICA Take 200 mg by mouth 3 (three) times daily.   promethazine 25 MG tablet Commonly known as: PHENERGAN Take 25 mg by mouth every 6 (six) hours as needed for nausea or vomiting.   rosuvastatin 20 MG tablet Commonly known as: CRESTOR Take 1 tablet (20 mg total) by mouth daily.   Semaglutide(0.25 or 0.'5MG'$ /DOS) 2 MG/3ML Sopn Inject 0.5 mg into the skin once a week. Started by: Dorita Sciara, MD   Spiriva Respimat 2.5 MCG/ACT Aers Generic drug: Tiotropium Bromide Monohydrate INHALE 2 PUFFS INTO THE LUNGS DAILY   tiZANidine 4 MG tablet Commonly known as: ZANAFLEX Take 4 mg by mouth 3 (three) times daily as needed.   Vitamin D (Ergocalciferol) 1.25 MG (50000 UNIT) Caps capsule Commonly known as: DRISDOL Take 50,000 Units by mouth every Tuesday.         OBJECTIVE:   Vital Signs: BP 124/72 (BP Location: Left Arm, Patient Position: Sitting, Cuff Size: Large)   Pulse 83   Ht '5\' 2"'$  (1.575 m)   Wt 234 lb (106.1 kg)   SpO2 94%   BMI 42.80 kg/m   Wt Readings from Last 3 Encounters:  12/30/21 234 lb (106.1 kg)  12/12/21 238 lb (108 kg)  10/16/21 220 lb (99.8 kg)     Exam: General: Pt appears well and is in NAD  Lungs: Clear with good BS bilat with no rales, rhonchi, or wheezes  Heart: RRR with  normal S1 and S2 and no gallops; no murmurs; no rub  Abdomen: Normoactive bowel sounds, soft, nontender, without masses or organomegaly palpable   Extremities: No pretibial edema.   Skin: Normal texture and temperature to palpation.  Neuro: MS is good with appropriate affect, pt is alert and Ox3    DM foot exam: 12/30/2021   The skin of the feet is intact without sores or ulcerations. The pedal pulses are 1+ on right and 1+ on left. The sensation is intact to a screening 5.07, 10 gram monofilament bilaterally    DATA REVIEWED:  Lab Results  Component Value Date   HGBA1C 7.6 (A) 12/30/2021   HGBA1C 6.6 (A) 06/25/2021   HGBA1C 6.8 (A) 02/21/2021     Latest Reference Range & Units 06/25/21 14:04  Sodium 135 - 145 mEq/L 141  Potassium 3.5 - 5.1 mEq/L 4.4  Chloride 96 - 112 mEq/L 105  CO2 19 - 32 mEq/L 32  Glucose 70 - 99 mg/dL 113 (H)  BUN 6 - 23 mg/dL 19  Creatinine 0.40 - 1.20 mg/dL 1.18  Calcium 8.4 - 10.5 mg/dL 9.4  GFR >60.00 mL/min 47.25 (L)      Latest Reference Range & Units 06/25/21 14:04  Creatinine,U mg/dL 72.1  Microalb, Ur 0.0 - 1.9 mg/dL <0.7  MICROALB/CREAT RATIO 0.0 - 30.0 mg/g 1.0   ASSESSMENT / PLAN / RECOMMENDATIONS:   1) Type 2 Diabetes Mellitus, Sub- Optimally controlled, With neuropathic, CKD III and macro vascular complications - Most recent A1c of 7.6 %. Goal A1c < 7.0 %.    - Pt with hyperglycemia due to intra-articular steroid injections  -She is tolerating [place] without side effects, will increase -BMP stable today  - No Hx of  pancreatitis , will start GLP-1 agonists, cautioned against GI side effects  MEDICATIONS: -Change  Humalog Mix  22 units with Breakfast and 20 units with supper -Increase Farxiga 10 mg  Daily   EDUCATION / INSTRUCTIONS: BG monitoring instructions: Patient is instructed to check her blood sugars 2 times a day, fasting and dinner. Call [facility] if: BG persistently < 70  I reviewed the Rule of 15 for the treatment of hypoglycemia in detail with the patient. Literature supplied.   2) Diabetic complications:  Eye: Does not have known  diabetic retinopathy.  Neuro/ Feet: Does have known diabetic peripheral neuropathy .  Renal: Patient does have known baseline CKD. She   is on an ACEI/ARB at present.     3) Dyslipidemia:  - LDL at goal with rosuvastatin, but her Tg continues to be elevated -We will increase rosuvastatin as below -We will emphasize compliance with Vascepa   Medications Vascepa 1 g, 2 caps caps BID   rosuvastatin 20 mg daily   4) Pituitary Microadenoma :  - Per pt, she has been diagnosed with pituitary adenoma in 2006. No prior sx . 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 - Pituitary hormone check was normal 06/2020 but repeat prolactin today shows slight elevation, will continue to monitor at this time - MRI showed 4x 2 mm pituitary adenoma (06/2020)     F/U in 4 months    Signed electronically by: Mack Guise, MD  [facility] Endocrinology  [facility] [address] [place], [place] [address] Phone: [phone] FAX: [phone]   CC: [name], NP [place]  [address] [place] [address] Phone: [phone]  Fax: [phone]  Return to Endocrinology clinic as below: Future Appointments  Date Time Provider [place]  04/29/2022 10:00 AM [facility]/VAS 1 [facility] None  04/29/2022 10:30 AM [facility]-ECHO/VAS 1 [facility] None  05/22/2022 11:30 AM Celester Lech, Melanie Crazier, MD [facility] None  06/03/2022 10:30 AM Patwardhan, Reynold Bowen, MD [facility] None

## 2021-12-30 NOTE — Patient Instructions (Signed)
-   Start Ozempic 0.25 mg once weekly for 6 weeks, than increase to 0.5 mg weekly  - Continue  Humalog Mix  22 units with Breakfast and 20 units with supper - Continue Farxiga 10 mg, 1 tablet every morning    HOW TO TREAT LOW BLOOD SUGARS (Blood sugar LESS THAN 70 MG/DL) Please follow the RULE OF 15 for the treatment of hypoglycemia treatment (when your (blood sugars are less than 70 mg/dL)   STEP 1: Take 15 grams of carbohydrates when your blood sugar is low, which includes:  3-4 GLUCOSE TABS  OR 3-4 OZ OF JUICE OR REGULAR SODA OR ONE TUBE OF GLUCOSE GEL    STEP 2: RECHECK blood sugar in 15 MINUTES STEP 3: If your blood sugar is still low at the 15 minute recheck --> then, go back to STEP 1 and treat AGAIN with another 15 grams of carbohydrates.

## 2021-12-31 ENCOUNTER — Encounter: Payer: Self-pay | Admitting: Internal Medicine

## 2021-12-31 LAB — PROLACTIN: Prolactin: 20.5 ng/mL

## 2022-03-14 ENCOUNTER — Other Ambulatory Visit: Payer: Self-pay | Admitting: Internal Medicine

## 2022-04-23 ENCOUNTER — Other Ambulatory Visit: Payer: Self-pay | Admitting: Nurse Practitioner

## 2022-04-23 ENCOUNTER — Ambulatory Visit
Admission: RE | Admit: 2022-04-23 | Discharge: 2022-04-23 | Disposition: A | Discharge status: Home / Self Care | Payer: Medicare Other | Source: Ambulatory Visit | Attending: Nurse Practitioner | Admitting: Nurse Practitioner

## 2022-04-23 DIAGNOSIS — R051 Acute cough: Secondary | ICD-10-CM

## 2022-04-29 ENCOUNTER — Ambulatory Visit: Payer: Medicare Other

## 2022-04-29 DIAGNOSIS — I6523 Occlusion and stenosis of bilateral carotid arteries: Secondary | ICD-10-CM

## 2022-04-29 DIAGNOSIS — E782 Mixed hyperlipidemia: Secondary | ICD-10-CM

## 2022-05-01 ENCOUNTER — Other Ambulatory Visit: Payer: Medicare Other

## 2022-05-01 ENCOUNTER — Ambulatory Visit: Payer: Medicare Other

## 2022-05-05 NOTE — Progress Notes (Signed)
Spoke with patient, she acknowledged understanding and agreed to continue on current medical therapy.

## 2022-05-21 NOTE — Progress Notes (Signed)
Name: Christina Solis  Age/ Sex: 72 y.o., female   MRN/ DOB: [id number], [birth year]     PCP: [name], NP   Reason for Endocrinology Evaluation: Type 2 Diabetes Mellitus  Initial Endocrine Consultative Visit:  05/03/2019    PATIENT IDENTIFIER: Christina Solis is a 72 y.o. female with a past medical history of HTN, COPD, and T2DM. The patient has followed with Endocrinology clinic since 05/03/2019 for consultative assistance with management of her diabetes.  DIABETIC HISTORY:  Ms. Gohlke was diagnosed with T2 DM in 2008 .She was on metformin that was stopped due to CKD, has been on insulin since 2009. Her hemoglobin A1c has ranged from 6.9% in 2015, peaking at 7.4% in 2020.  On her initial visit to our clinic she had an A1c of 7.4%, she was on Humalog mix, which was continued and adjusted.   Started Ozempic 12/2021    PITUITARY TUMOR :  She was first diagnosed with pituitary adenoma in 2006 . She was put on medication for ~ 8 month but when her level normalized ,the medicine was stopped. She had galactorrhea up to 2018.  Repeat prolactin in 2022 was normal with appropriately elevated FSH and normal IGF  Prolactin was elevated in 2023   She is a retired Marine scientist Lives with daughter SUBJECTIVE:   During the last visit (12/30/2021): A1c 7.6%      Today (05/22/2022): Christina Solis is here for follow-up on diabetes management. She checks her blood sugars occasionally.  The patient has not had hypoglycemic episodes since the last clinic visit.   She has been noted with weight loss  She had a malfunction ozempic pen and was without it for 6 weeks.   Denies nausea, vomiting or diarrhea   HOME DIABETES REGIMEN:  Humalog mix 22 units with breakfast and 20 units with supper Farxiga 10 mg daily  Ozempic 0.5 mg weekly    Statin: Yes ACE-I/ARB: Yes    METER DOWNLOAD SUMMARY: Date range evaluated: 05/09/2022-05/22/2022 Average Number Tests = 5 Overall Mean FS Glucose =  105   BG Ranges Low = 67 High = 156   Hypoglycemic Events/30 Days: BG < 50 = 0 Episodes of symptomatic severe hypoglycemia = 0    DIABETIC COMPLICATIONS: Microvascular complications:  Neuropathy, CKD III, right eye cataract  Denies: retinopathy Last eye exam: 11/2021    Macrovascular complications:  CHF, PVD Denies: CAD, CVA    HISTORY:  Past Medical History:  Past Medical History:  Diagnosis Date   Anemia    sickle cell trait   Anxiety    Arthritis    "knees" (10/12/2013)   CHF (congestive heart failure) (HCC)    Chronic back pain    Chronic bronchitis ([place])    "got it q yr when I lived in [place]"   Complication of anesthesia    hard to put under   COPD (chronic obstructive pulmonary disease) ([place])    Dysrhythmia    Family history of anesthesia complication    sister hard to wake up   Family history of anesthesia complication    niece have itching   Fibromyalgia    GERD (gastroesophageal reflux disease)    Hyperlipidemia    Hypertension    Irregular heart beat    Migraine    "used to get them alot; not regular anymore" (10/12/2013)   PAD (peripheral artery disease) [facility])    Pituitary tumor    followed at [facility]; on cabergoline 09/2013  Pneumonia    "twice"   Type II diabetes mellitus ([place])    Past Surgical History:  Past Surgical History:  Procedure Laterality Date   ABDOMINAL AORTAGRAM N/A 06/28/2012   Procedure: ABDOMINAL AORTAGRAM;  Surgeon: Laverda Page, MD;  Location: [facility] CATH LAB;  Service: Cardiovascular;  Laterality: N/A;   APPENDECTOMY     BACK SURGERY     x 2, lower back   BILATERAL CARPAL TUNNEL RELEASE     CARDIAC CATHETERIZATION     CESAREAN SECTION  1974; 1976; [place] N/A 10/12/2013   Procedure: LAPAROSCOPIC CHOLECYSTECTOMY WITH INTRAOPERATIVE CHOLANGIOGRAM;  Surgeon: [name]. Georgette Dover, MD;  Location: [facility];  Service: General;  Laterality: N/A;   COLONOSCOPY     FEMORAL ARTERY STENT Left 06/28/2012    SFA   LAPAROSCOPIC CHOLECYSTECTOMY  10/12/2013   LEFT HEART CATHETERIZATION WITH CORONARY ANGIOGRAM N/A 06/28/2012   Procedure: LEFT HEART CATHETERIZATION WITH CORONARY ANGIOGRAM;  Surgeon: Laverda Page, MD;  Location: [facility] CATH LAB;  Service: Cardiovascular;  Laterality: N/A;   LOWER EXTREMITY ANGIOGRAM N/A 06/28/2012   Procedure: LOWER EXTREMITY ANGIOGRAM;  Surgeon: Laverda Page, MD;  Location: [facility] CATH LAB;  Service: Cardiovascular;  Laterality: N/A;   PERIPHERAL VASCULAR CATHETERIZATION N/A 09/10/2015   Procedure: Abdominal Aortogram w/Lower Extremity;  Surgeon: Adrian Prows, MD;  Location: [facility] CV LAB;  Service: Cardiovascular;  Laterality: N/A;   POSTERIOR LUMBAR FUSION     REPLACEMENT TOTAL KNEE Left    TONSILLECTOMY     TUBAL LIGATION  1978   Social History:  reports that she quit smoking about 6 years ago. Her smoking use included cigarettes. She started smoking about 21 years ago. She has a 23.00 pack-year smoking history. She has never used smokeless tobacco. She reports that she does not drink alcohol and does not use drugs. Family History:  Family History  Problem Relation Age of Onset   Diabetes Mother    Kidney disease Mother    Cancer Father    Pancreatic cancer Father    Kidney disease Brother    Cancer Brother    Colon cancer Neg Hx    Inflammatory bowel disease Neg Hx    Esophageal cancer Neg Hx    Liver disease Neg Hx    Rectal cancer Neg Hx    Stomach cancer Neg Hx      HOME MEDICATIONS: Allergies as of 05/22/2022       Reactions   Coconut (cocos Nucifera) Hives        Medication List        Accurate as of May 22, 2022 11:44 AM. If you have any questions, ask your nurse or doctor.          albuterol 108 (90 Base) MCG/ACT inhaler Commonly known as: VENTOLIN HFA Inhale 1 puff into the lungs every 6 (six) hours as needed for wheezing or shortness of breath. ProAir   albuterol (2.5 MG/3ML) 0.083% nebulizer solution Commonly known as:  PROVENTIL Take 2.5 mg by nebulization every 6 (six) hours as needed for wheezing or shortness of breath.   budesonide-formoterol 160-4.5 MCG/ACT inhaler Commonly known as: SYMBICORT Inhale 2 puffs into the lungs 2 (two) times daily.   cetirizine-pseudoephedrine 5-120 MG tablet Commonly known as: ZYRTEC-D Take 1 tablet by mouth daily.   clopidogrel 75 MG tablet Commonly known as: PLAVIX Take 75 mg by mouth daily.   dapagliflozin propanediol 10 MG Tabs tablet Commonly known as: Farxiga Take 1 tablet (10 mg  total) by mouth daily before breakfast.   diclofenac Sodium 1 % Gel Commonly known as: VOLTAREN Apply topically.   dicyclomine 20 MG tablet Commonly known as: BENTYL Take 20 mg by mouth 4 (four) times daily as needed for spasms.   ferrous sulfate 325 (65 FE) MG tablet Take 325 mg by mouth daily with breakfast.   HumaLOG Mix 75/25 KwikPen (75-25) 100 UNIT/ML Kwikpen Generic drug: Insulin Lispro Prot & Lispro INJECT 22 UNITS UNDER THE SKIN DAILY WITH BREAKFAST AND 20 UNITS DAILY WITH SUPPER   icosapent Ethyl 1 g capsule Commonly known as: VASCEPA Take 2 capsules (2 g total) by mouth 2 (two) times daily.   Insulin Pen Needle 32G X 4 MM Misc 1 Device by Does not apply route in the morning and at bedtime.   lisinopril-hydrochlorothiazide 20-12.5 MG tablet Commonly known as: ZESTORETIC Take 1 tablet by mouth daily.   lubiprostone 24 MCG capsule Commonly known as: AMITIZA Take 24 mcg by mouth 2 (two) times daily with a meal.   mometasone 50 MCG/ACT nasal spray Commonly known as: NASONEX Place 2 sprays into the nose as needed.   multivitamin capsule Take 1 capsule by mouth daily.   naproxen 250 MG tablet Commonly known as: NAPROSYN Take 250 mg by mouth 2 (two) times daily.   Olopatadine HCl 0.2 % Soln Apply 1 drop to eye daily. What changed:  when to take this reasons to take this   omeprazole 40 MG capsule Commonly known as: PRILOSEC TAKE 1 CAPSULE(40 MG)  BY MOUTH DAILY   oxyCODONE-acetaminophen 10-325 MG tablet Commonly known as: PERCOCET Take 1 tablet by mouth every 4 (four) hours as needed.   pregabalin 200 MG capsule Commonly known as: LYRICA Take 200 mg by mouth 3 (three) times daily.   promethazine 25 MG tablet Commonly known as: PHENERGAN Take 25 mg by mouth every 6 (six) hours as needed for nausea or vomiting.   rosuvastatin 20 MG tablet Commonly known as: CRESTOR Take 1 tablet (20 mg total) by mouth daily.   Semaglutide(0.25 or 0.'5MG'$ /DOS) 2 MG/3ML Sopn Inject 0.5 mg into the skin once a week.   Spiriva Respimat 2.5 MCG/ACT Aers Generic drug: Tiotropium Bromide Monohydrate INHALE 2 PUFFS INTO THE LUNGS DAILY   temazepam 7.5 MG capsule Commonly known as: RESTORIL Take 7.5 mg by mouth daily.   tiZANidine 4 MG tablet Commonly known as: ZANAFLEX Take 4 mg by mouth 3 (three) times daily as needed.   Vitamin D (Ergocalciferol) 1.25 MG (50000 UNIT) Caps capsule Commonly known as: DRISDOL Take 50,000 Units by mouth every Tuesday.         OBJECTIVE:   Vital Signs: BP 120/80 (BP Location: Left Arm, Patient Position: Sitting, Cuff Size: Large)   Pulse 72   Ht '5\' 2"'$  (1.575 m)   Wt 221 lb (100.2 kg)   SpO2 95%   BMI 40.42 kg/m   Wt Readings from Last 3 Encounters:  05/22/22 221 lb (100.2 kg)  12/30/21 234 lb (106.1 kg)  12/12/21 238 lb (108 kg)     Exam: General: Pt appears well and is in NAD  Lungs: Clear with good BS bilat   Heart: RRR   Extremities: No pretibial edema.   Neuro: MS is good with appropriate affect, pt is alert and Ox3    DM foot exam: 12/30/2021   The skin of the feet is intact without sores or ulcerations. The pedal pulses are 1+ on right and 1+ on left. The sensation is  intact to a screening 5.07, 10 gram monofilament bilaterally    DATA REVIEWED:  Lab Results  Component Value Date   HGBA1C 6.3 (A) 05/22/2022   HGBA1C 7.6 (A) 12/30/2021   HGBA1C 6.6 (A) 06/25/2021     Latest Reference Range & Units 12/30/21 13:31  Sodium 135 - 145 mEq/L 139  Potassium 3.5 - 5.1 mEq/L 3.9  Chloride 96 - 112 mEq/L 106  CO2 19 - 32 mEq/L 28  Glucose 70 - 99 mg/dL 93  BUN 6 - 23 mg/dL 22  Creatinine 0.40 - 1.20 mg/dL 1.16  Calcium 8.4 - 10.5 mg/dL 9.2  GFR >60.00 mL/min 48.06 (L)  Total CHOL/HDL Ratio  5  Cholesterol 0 - 200 mg/dL 145  HDL Cholesterol >39.00 mg/dL 28.00 (L)  LDL (calc) 0 - 99 mg/dL 85  NonHDL  117.01  Triglycerides 0.0 - 149.0 mg/dL 158.0 (H)  VLDL 0.0 - 40.0 mg/dL 31.6  Prolactin ng/mL 20.5  TSH 0.35 - 5.50 uIU/mL 2.36  T4,Free(Direct) 0.60 - 1.60 ng/dL 0.66    Latest Reference Range & Units 06/25/21 14:04  Creatinine,U mg/dL 72.1  Microalb, Ur 0.0 - 1.9 mg/dL <0.7  MICROALB/CREAT RATIO 0.0 - 30.0 mg/g 1.0   ASSESSMENT / PLAN / RECOMMENDATIONS:   1) Type 2 Diabetes Mellitus, Optimally controlled, With neuropathic, CKD III and macrovascular complications - Most recent A1c of 6.3 %. Goal A1c < 7.0 %.    - A1c is optimal - Will reduce insulin as below  - Insurance won't cover Ozempic , will switch to either mounjaro or trulicity   MEDICATIONS: -Decrease Humalog Mix  20 units with Breakfast and 20 units with supper -Continue Farxiga 10 mg  Daily  -Will try switching Ozempic to either mounjaro or Trulicity   EDUCATION / INSTRUCTIONS: BG monitoring instructions: Patient is instructed to check her blood sugars 2 times a day, fasting and dinner. Call [facility] if: BG persistently < 70  I reviewed the Rule of 15 for the treatment of hypoglycemia in detail with the patient. Literature supplied.   2) Diabetic complications:  Eye: Does not have known diabetic retinopathy.  Neuro/ Feet: Does have known diabetic peripheral neuropathy .  Renal: Patient does have known baseline CKD. She   is on an ACEI/ARB at present.     3) Dyslipidemia:  - LDL at goal with rosuvastatin -TG has trended down from 381 to 158 mg/dL in the past     Medications Continue rosuvastatin 20 mg daily     4) Pituitary Microadenoma :  - Per pt, she has been diagnosed with pituitary adenoma in 2006. No prior sx . 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 - Pituitary hormone check was normal 06/2020  -She has slight elevation of prolactin in February 2023 but repeat testing 12/2021 shows normal prolactin and TFTs - MRI showed 4x 2 mm pituitary adenoma (06/2020)     F/U in 6 months    Signed electronically by: Mack Guise, MD  [facility]  [facility] [address], [place] [address] Phone: [phone] FAX: [phone]   CC: [name], NP [place] [address] Phone: [phone]  Fax: [phone]  Return to Endocrinology clinic as below: Future Appointments  Date Time Provider [place]  06/03/2022 10:30 AM Patwardhan, Reynold Bowen, MD [facility] None

## 2022-05-22 ENCOUNTER — Ambulatory Visit (INDEPENDENT_AMBULATORY_CARE_PROVIDER_SITE_OTHER): Payer: Medicare Other | Admitting: Internal Medicine

## 2022-05-22 ENCOUNTER — Encounter: Payer: Self-pay | Admitting: Internal Medicine

## 2022-05-22 VITALS — BP 120/80 | HR 72 | Ht 62.0 in | Wt 221.0 lb

## 2022-05-22 DIAGNOSIS — Z794 Long term (current) use of insulin: Secondary | ICD-10-CM | POA: Diagnosis not present

## 2022-05-22 DIAGNOSIS — E1159 Type 2 diabetes mellitus with other circulatory complications: Secondary | ICD-10-CM | POA: Diagnosis not present

## 2022-05-22 DIAGNOSIS — E1142 Type 2 diabetes mellitus with diabetic polyneuropathy: Secondary | ICD-10-CM

## 2022-05-22 DIAGNOSIS — E1122 Type 2 diabetes mellitus with diabetic chronic kidney disease: Secondary | ICD-10-CM

## 2022-05-22 DIAGNOSIS — D352 Benign neoplasm of pituitary gland: Secondary | ICD-10-CM

## 2022-05-22 DIAGNOSIS — E782 Mixed hyperlipidemia: Secondary | ICD-10-CM

## 2022-05-22 DIAGNOSIS — N1831 Chronic kidney disease, stage 3a: Secondary | ICD-10-CM

## 2022-05-22 LAB — POCT GLYCOSYLATED HEMOGLOBIN (HGB A1C): Hemoglobin A1C: 6.3 % — AB (ref 4.0–5.6)

## 2022-05-22 MED ORDER — INSULIN PEN NEEDLE 32G X 4 MM MISC: 1.0000 | Freq: Two times a day (BID) | 3 refills | Status: DC

## 2022-05-22 MED ORDER — ROSUVASTATIN CALCIUM 20 MG PO TABS: 20.0000 mg | ORAL_TABLET | Freq: Every day | ORAL | 3 refills | Status: DC

## 2022-05-22 MED ORDER — TIRZEPATIDE 5 MG/0.5ML ~~LOC~~ SOAJ: 5.0000 mg | SUBCUTANEOUS | 3 refills | Status: DC

## 2022-05-22 MED ORDER — HUMALOG MIX 75/25 KWIKPEN (75-25) 100 UNIT/ML ~~LOC~~ SUPN: PEN_INJECTOR | SUBCUTANEOUS | 3 refills | Status: DC

## 2022-05-22 MED ORDER — DAPAGLIFLOZIN PROPANEDIOL 10 MG PO TABS: 10.0000 mg | ORAL_TABLET | Freq: Every day | ORAL | 3 refills | Status: DC

## 2022-05-22 NOTE — Patient Instructions (Addendum)
-   Will try switching Ozempic to either mounjaro 5 mg or Trulicity 1.5 mg weekly  - Decrease Humalog Mix  20 units with Breakfast and 18 units with supper - Continue Farxiga 10 mg, 1 tablet every morning    HOW TO TREAT LOW BLOOD SUGARS (Blood sugar LESS THAN 70 MG/DL) Please follow the RULE OF 15 for the treatment of hypoglycemia treatment (when your (blood sugars are less than 70 mg/dL)   STEP 1: Take 15 grams of carbohydrates when your blood sugar is low, which includes:  3-4 GLUCOSE TABS  OR 3-4 OZ OF JUICE OR REGULAR SODA OR ONE TUBE OF GLUCOSE GEL    STEP 2: RECHECK blood sugar in 15 MINUTES STEP 3: If your blood sugar is still low at the 15 minute recheck --> then, go back to STEP 1 and treat AGAIN with another 15 grams of carbohydrates.

## 2022-06-03 ENCOUNTER — Ambulatory Visit: Payer: 59 | Admitting: Cardiology

## 2022-06-03 NOTE — Progress Notes (Signed)
Patient referred by [name], NP for carotid artery calcification  Subjective:   Christina Solis, female    DOB: 22-Apr-1951, 72 y.o.   MRN: [id number]   No chief complaint on file.    HPI  72 y/o [place] female with hypertension, hyperlipidemia, prior tobacco abuse, mod carotid artery stenosis, PAD  Patient was seen in [facility] ER on 10/16/2021 with complaints of chest pain. ACS was ruled out. Pain was pleuritic in left lower part of her chest/ This was later attributed to muscle spasm and improved after adjustment of her pain medications. She stays active with walking etc and has not had any recurrence of chest pain since then. Blood pressure elevated today, but usually well controlled.    Current Outpatient Medications:    albuterol (PROVENTIL) (2.5 MG/3ML) 0.083% nebulizer solution, Take 2.5 mg by nebulization every 6 (six) hours as needed for wheezing or shortness of breath., Disp: , Rfl:    albuterol (VENTOLIN HFA) 108 (90 Base) MCG/ACT inhaler, Inhale 1 puff into the lungs every 6 (six) hours as needed for wheezing or shortness of breath. ProAir, Disp: , Rfl:    budesonide-formoterol (SYMBICORT) 160-4.5 MCG/ACT inhaler, Inhale 2 puffs into the lungs 2 (two) times daily., Disp: , Rfl:    cetirizine-pseudoephedrine (ZYRTEC-D) 5-120 MG tablet, Take 1 tablet by mouth daily., Disp: 15 tablet, Rfl: 0   clopidogrel (PLAVIX) 75 MG tablet, Take 75 mg by mouth daily. , Disp: , Rfl: 1   dapagliflozin propanediol (FARXIGA) 10 MG TABS tablet, Take 1 tablet (10 mg total) by mouth daily before breakfast., Disp: 90 tablet, Rfl: 3   diclofenac Sodium (VOLTAREN) 1 % GEL, Apply topically., Disp: , Rfl:    dicyclomine (BENTYL) 20 MG tablet, Take 20 mg by mouth 4 (four) times daily as needed for spasms., Disp: , Rfl:    ferrous sulfate 325 (65 FE) MG tablet, Take 325 mg by mouth daily with breakfast. , Disp: , Rfl:    HUMALOG MIX 75/25 KWIKPEN (75-25) 100 UNIT/ML KwikPen, Inject 20 Units into  the skin daily with breakfast AND 18 Units daily with supper., Disp: 45 mL, Rfl: 3   icosapent Ethyl (VASCEPA) 1 g capsule, Take 2 capsules (2 g total) by mouth 2 (two) times daily., Disp: 360 capsule, Rfl: 2   Insulin Pen Needle 32G X 4 MM MISC, 1 Device by Does not apply route in the morning and at bedtime., Disp: 200 each, Rfl: 3   lisinopril-hydrochlorothiazide (PRINZIDE,ZESTORETIC) 20-12.5 MG per tablet, Take 1 tablet by mouth daily. , Disp: , Rfl:    lubiprostone (AMITIZA) 24 MCG capsule, Take 24 mcg by mouth 2 (two) times daily with a meal., Disp: , Rfl:    mometasone (NASONEX) 50 MCG/ACT nasal spray, Place 2 sprays into the nose as needed., Disp: , Rfl:    Multiple Vitamin (MULTIVITAMIN) capsule, Take 1 capsule by mouth daily., Disp: , Rfl:    naproxen (NAPROSYN) 250 MG tablet, Take 250 mg by mouth 2 (two) times daily., Disp: , Rfl:    Olopatadine HCl 0.2 % SOLN, Apply 1 drop to eye daily. (Patient taking differently: Apply 1 drop to eye as needed.), Disp: 2.5 mL, Rfl: 0   omeprazole (PRILOSEC) 40 MG capsule, TAKE 1 CAPSULE(40 MG) BY MOUTH DAILY, Disp: 90 capsule, Rfl: 2   oxyCODONE-acetaminophen (PERCOCET) 10-325 MG tablet, Take 1 tablet by mouth every 4 (four) hours as needed., Disp: , Rfl:    pregabalin (LYRICA) 200 MG capsule, Take 200  mg by mouth 3 (three) times daily., Disp: , Rfl:    promethazine (PHENERGAN) 25 MG tablet, Take 25 mg by mouth every 6 (six) hours as needed for nausea or vomiting. , Disp: , Rfl:    rosuvastatin (CRESTOR) 20 MG tablet, Take 1 tablet (20 mg total) by mouth daily., Disp: 90 tablet, Rfl: 3   SPIRIVA RESPIMAT 2.5 MCG/ACT AERS, INHALE 2 PUFFS INTO THE LUNGS DAILY, Disp: 4 g, Rfl: 5   temazepam (RESTORIL) 7.5 MG capsule, Take 7.5 mg by mouth daily., Disp: , Rfl:    tirzepatide (MOUNJARO) 5 MG/0.5ML Pen, Inject 5 mg into the skin once a week., Disp: 6 mL, Rfl: 3   tiZANidine (ZANAFLEX) 4 MG tablet, Take 4 mg by mouth 3 (three) times daily as needed., Disp: ,  Rfl:    Vitamin D, Ergocalciferol, (DRISDOL) 50000 UNITS CAPS capsule, Take 50,000 Units by mouth every Tuesday., Disp: , Rfl:   Cardiovascular and other pertinent studies:  Lower Extremity Arterial Duplex 05/01/2022:  Diffuse dampened monophasic waveform pattern of the entire right lower  extremity may suggest hemodynamically proximal (CIA) stenosis. Severe  damping also n noted in the right mid SFA suggests >50% stenosis in the  proximal right SFA.  Left mid superficial femoral artery to distal superficial femoral artery  stent noted. There is ISR noted approximately 50%.  This exam reveals moderately decreased perfusion of the right lower  extremity, noted at the dorsalis pedis artery level (ABI 0.71) with  severely abnormal monophasic waveform pattern..   This exam reveals moderately decreased perfusion of the left lower  extremity, noted at the post tibial artery level (ABI 0.69) with severely  abnormal monophasic waveform pattern..  Compared to the study done on 08/06/2015, no significant change. Compared  to ABI only study 02/29/2020, right ABI 0.75, left ABI 1.00 with mildly  abnormal biphasic waveform pattern.   Carotid artery duplex 04/29/2022: Duplex suggests stenosis in the right internal carotid artery (50-69%), upper end of spectrum. Right ECA stenosis of <50%. Duplex suggests stenosis in the left internal carotid artery (50-69%), lower end of spectrum. Antegrade right vertebral artery flow. Antegrade left vertebral artery flow. Compared to 02/04/2021, no significant change. Follow up in six months is appropriate if clinically indicated.   EKG 12/12/2021: Sinus rhythm 73 bpm Left bundle branch block  Carotid artery duplex 02/04/2021:  Duplex suggests stenosis in the right internal carotid artery (50-69%).  Duplex suggests stenosis in the right external carotid artery (<50%).  Duplex suggests stenosis in the left internal carotid artery (50-69%).  Duplex suggests  stenosis in the left external carotid artery (<50%).  Antegrade right vertebral artery flow. Antegrade left vertebral artery  flow.  Compared to the study done on 09/10/2020, no significant change. Follow up  in six months is appropriate if clinically indicated.  ABI 02/28/2020:  This exam reveals moderately decreased perfusion of the right lower  extremity, noted at the dorsalis pedis and post tibial artery level (ABI  0.75) and normal perfusion of the left lower extremity (ABI 1.00).  There is mildly abnormal biphasic waveform at the ankles.  Recent labs: 12/30/2021: Glucose 93, BUN/Cr 22/1.16. EGFR 48. Na/K 139/3.9. ***Rest of the CMP normal H/H 145/158. MCV 28. Platelets 85  Latest Reference Range & Units 06/25/21 13:43 12/30/21 13:09 05/22/22 11:43  Hemoglobin A1C 4.0 - 5.6 % 6.6 ! 7.6 ! 6.3 !    10/16/2021: Glucose 94, BUN/Cr 28/1.31. EGFR 44. Na/K 141/4.4.  H/H 12/39. MCV 96. Platelets 225 Trop HS  5,4  02/21/2021: Glucose 94, BUN/Cr 24/1.15. EGFR 48. Na/K 142/3.8.  H/H 12/39. MCV 94. Platelets 250 Chol 121, TG 297, HDL 21, LDL 41 HbA1C 6.8%   Review of Systems  Cardiovascular:  Negative for chest pain, dyspnea on exertion, leg swelling, palpitations and syncope.         There were no vitals filed for this visit.    There is no height or weight on file to calculate BMI. There were no vitals filed for this visit.    Objective:   Physical Exam Vitals and nursing note reviewed.  Constitutional:      General: She is not in acute distress. Neck:     Vascular: No JVD.  Cardiovascular:     Rate and Rhythm: Normal rate and regular rhythm.     Pulses:          Femoral pulses are 0 on the right side and 0 on the left side.      Popliteal pulses are 1+ on the right side and 1+ on the left side.     Heart sounds: Normal heart sounds. No murmur heard. Pulmonary:     Effort: Pulmonary effort is normal.     Breath sounds: Normal breath sounds. No wheezing or rales.   Musculoskeletal:     Right lower leg: No edema.     Left lower leg: No edema.            Assessment & Recommendations:   72 y/o [place] American female with hypertension, hyperlipidemia, prior tobacco abuse, mod carotid artery stenosis, PAD  *** Chest pain: Atypical, resolved.  While she has risk factors for CAD, suspicion is low for pain June to have been angina.  She may proceed with upcoming wrist surgery with low cardiac risk.   Carotid artery stenosis: Mod b/l carotid artery stenoses. Asymptomatic. Continue Plavix, statin Carotid duplex in 04/2023.  PAD: Multilevel disease, likely inflow and outflow. No claudication. Continue medical management. Will check LEA duplex in 04/2022.  Upcoming lipid panel labs with PCP next week. Will follow up.   F/u in 05/2022     Nigel Mormon, MD Pager: [phone] Office: [phone]

## 2022-06-08 ENCOUNTER — Encounter: Payer: Self-pay | Admitting: Cardiology

## 2022-06-08 ENCOUNTER — Ambulatory Visit: Payer: 59 | Admitting: Cardiology

## 2022-06-08 VITALS — BP 148/84 | HR 87 | Resp 16 | Ht 62.0 in | Wt 221.4 lb

## 2022-06-08 DIAGNOSIS — E1142 Type 2 diabetes mellitus with diabetic polyneuropathy: Secondary | ICD-10-CM

## 2022-06-08 DIAGNOSIS — E782 Mixed hyperlipidemia: Secondary | ICD-10-CM

## 2022-06-08 DIAGNOSIS — I739 Peripheral vascular disease, unspecified: Secondary | ICD-10-CM

## 2022-06-08 MED ORDER — ROSUVASTATIN CALCIUM 40 MG PO TABS: 40.0000 mg | ORAL_TABLET | Freq: Every day | ORAL | 3 refills | Status: DC

## 2022-06-08 NOTE — Progress Notes (Signed)
Patient referred by [name], NP for carotid artery calcification  Subjective:   Christina Solis, female    DOB: 1950-08-21, 72 y.o.   MRN: [id number]   Chief Complaint  Patient presents with   PAD   Coronary Artery Disease   Follow-up    6 months     HPI  72 y/o [place] American female with hypertension, hyperlipidemia, prior tobacco abuse, mod carotid artery stenosis, PAD  Patient is doing well, denies chest pain, shortness of breath, palpitations, leg edema, orthopnea, PND, TIA/syncope. At this time, she has no claudication symptoms.  Reviewed recent test results with the patient, details below. Blood pressure elevated today, well controlled at home.     Current Outpatient Medications:    albuterol (PROVENTIL) (2.5 MG/3ML) 0.083% nebulizer solution, Take 2.5 mg by nebulization every 6 (six) hours as needed for wheezing or shortness of breath., Disp: , Rfl:    albuterol (VENTOLIN HFA) 108 (90 Base) MCG/ACT inhaler, Inhale 1 puff into the lungs every 6 (six) hours as needed for wheezing or shortness of breath. ProAir, Disp: , Rfl:    budesonide-formoterol (SYMBICORT) 160-4.5 MCG/ACT inhaler, Inhale 2 puffs into the lungs 2 (two) times daily., Disp: , Rfl:    cetirizine-pseudoephedrine (ZYRTEC-D) 5-120 MG tablet, Take 1 tablet by mouth daily., Disp: 15 tablet, Rfl: 0   clopidogrel (PLAVIX) 75 MG tablet, Take 75 mg by mouth daily. , Disp: , Rfl: 1   dapagliflozin propanediol (FARXIGA) 10 MG TABS tablet, Take 1 tablet (10 mg total) by mouth daily before breakfast., Disp: 90 tablet, Rfl: 3   diclofenac Sodium (VOLTAREN) 1 % GEL, Apply topically., Disp: , Rfl:    dicyclomine (BENTYL) 20 MG tablet, Take 20 mg by mouth 4 (four) times daily as needed for spasms., Disp: , Rfl:    ferrous sulfate 325 (65 FE) MG tablet, Take 325 mg by mouth daily with breakfast. , Disp: , Rfl:    HUMALOG MIX 75/25 KWIKPEN (75-25) 100 UNIT/ML KwikPen, Inject 20 Units into the skin daily with  breakfast AND 18 Units daily with supper., Disp: 45 mL, Rfl: 3   icosapent Ethyl (VASCEPA) 1 g capsule, Take 2 capsules (2 g total) by mouth 2 (two) times daily., Disp: 360 capsule, Rfl: 2   Insulin Pen Needle 32G X 4 MM MISC, 1 Device by Does not apply route in the morning and at bedtime., Disp: 200 each, Rfl: 3   lisinopril-hydrochlorothiazide (PRINZIDE,ZESTORETIC) 20-12.5 MG per tablet, Take 1 tablet by mouth daily. , Disp: , Rfl:    lubiprostone (AMITIZA) 24 MCG capsule, Take 24 mcg by mouth 2 (two) times daily with a meal., Disp: , Rfl:    mometasone (NASONEX) 50 MCG/ACT nasal spray, Place 2 sprays into the nose as needed., Disp: , Rfl:    Multiple Vitamin (MULTIVITAMIN) capsule, Take 1 capsule by mouth daily., Disp: , Rfl:    naproxen (NAPROSYN) 250 MG tablet, Take 250 mg by mouth 2 (two) times daily., Disp: , Rfl:    Olopatadine HCl 0.2 % SOLN, Apply 1 drop to eye daily. (Patient taking differently: Apply 1 drop to eye as needed.), Disp: 2.5 mL, Rfl: 0   omeprazole (PRILOSEC) 40 MG capsule, TAKE 1 CAPSULE(40 MG) BY MOUTH DAILY, Disp: 90 capsule, Rfl: 2   oxyCODONE-acetaminophen (PERCOCET) 10-325 MG tablet, Take 1 tablet by mouth every 4 (four) hours as needed., Disp: , Rfl:    pregabalin (LYRICA) 200 MG capsule, Take 200 mg by mouth 3 (three) times  daily., Disp: , Rfl:    promethazine (PHENERGAN) 25 MG tablet, Take 25 mg by mouth every 6 (six) hours as needed for nausea or vomiting. , Disp: , Rfl:    rosuvastatin (CRESTOR) 20 MG tablet, Take 1 tablet (20 mg total) by mouth daily., Disp: 90 tablet, Rfl: 3   SPIRIVA RESPIMAT 2.5 MCG/ACT AERS, INHALE 2 PUFFS INTO THE LUNGS DAILY, Disp: 4 g, Rfl: 5   temazepam (RESTORIL) 7.5 MG capsule, Take 7.5 mg by mouth daily., Disp: , Rfl:    tirzepatide (MOUNJARO) 5 MG/0.5ML Pen, Inject 5 mg into the skin once a week., Disp: 6 mL, Rfl: 3   tiZANidine (ZANAFLEX) 4 MG tablet, Take 4 mg by mouth 3 (three) times daily as needed., Disp: , Rfl:    Vitamin D,  Ergocalciferol, (DRISDOL) 50000 UNITS CAPS capsule, Take 50,000 Units by mouth every Tuesday., Disp: , Rfl:   Cardiovascular and other pertinent studies:  EKG 06/08/2022: Sinus rhythm 80 bpm  Left bundle branch block  Lower Extremity Arterial Duplex 05/01/2022:  Diffuse dampened monophasic waveform pattern of the entire right lower  extremity may suggest hemodynamically proximal (CIA) stenosis. Severe  damping also n noted in the right mid SFA suggests >50% stenosis in the  proximal right SFA.  Left mid superficial femoral artery to distal superficial femoral artery  stent noted. There is ISR noted approximately 50%.  This exam reveals moderately decreased perfusion of the right lower  extremity, noted at the dorsalis pedis artery level (ABI 0.71) with  severely abnormal monophasic waveform pattern..   This exam reveals moderately decreased perfusion of the left lower  extremity, noted at the post tibial artery level (ABI 0.69) with severely  abnormal monophasic waveform pattern..  Compared to the study done on 08/06/2015, no significant change. Compared  to ABI only study 02/29/2020, right ABI 0.75, left ABI 1.00 with mildly  abnormal biphasic waveform pattern.   Carotid artery duplex 04/29/2022: Duplex suggests stenosis in the right internal carotid artery (50-69%), upper end of spectrum. Right ECA stenosis of <50%. Duplex suggests stenosis in the left internal carotid artery (50-69%), lower end of spectrum. Antegrade right vertebral artery flow. Antegrade left vertebral artery flow. Compared to 02/04/2021, no significant change. Follow up in six months is appropriate if clinically indicated.  Carotid artery duplex 02/04/2021:  Duplex suggests stenosis in the right internal carotid artery (50-69%).  Duplex suggests stenosis in the right external carotid artery (<50%).  Duplex suggests stenosis in the left internal carotid artery (50-69%).  Duplex suggests stenosis in the left  external carotid artery (<50%).  Antegrade right vertebral artery flow. Antegrade left vertebral artery  flow.  Compared to the study done on 09/10/2020, no significant change. Follow up  in six months is appropriate if clinically indicated.  ABI 02/28/2020:  This exam reveals moderately decreased perfusion of the right lower  extremity, noted at the dorsalis pedis and post tibial artery level (ABI  0.75) and normal perfusion of the left lower extremity (ABI 1.00).  There is mildly abnormal biphasic waveform at the ankles.  Recent labs: 12/30/2021: Glucose 93, BUN/Cr 22/1.16. EGFR 48. Na/K 139/3.9.  H/H 145/158. MCV 28. Platelets 85  Latest Reference Range & Units 06/25/21 13:43 12/30/21 13:09 05/22/22 11:43  Hemoglobin A1C 4.0 - 5.6 % 6.6 ! 7.6 ! 6.3 !    10/16/2021: Glucose 94, BUN/Cr 28/1.31. EGFR 44. Na/K 141/4.4.  H/H 12/39. MCV 96. Platelets 225 Trop HS 5,4  02/21/2021: Glucose 94, BUN/Cr 24/1.15. EGFR 48. Na/K  142/3.8.  H/H 12/39. MCV 94. Platelets 250 Chol 121, TG 297, HDL 21, LDL 41 HbA1C 6.8%   Review of Systems  Cardiovascular:  Negative for chest pain, dyspnea on exertion, leg swelling, palpitations and syncope.         Vitals:   06/08/22 0918  BP: (!) 148/84  Pulse: 87  Resp: 16  SpO2: 95%     Body mass index is 40.49 kg/m. Filed Weights   06/08/22 0918  Weight: 221 lb 6.4 oz (100.4 kg)     Objective:   Physical Exam Vitals and nursing note reviewed.  Constitutional:      General: She is not in acute distress. Neck:     Vascular: No JVD.  Cardiovascular:     Rate and Rhythm: Normal rate and regular rhythm.     Pulses:          Femoral pulses are 0 on the right side and 0 on the left side.      Popliteal pulses are 1+ on the right side and 1+ on the left side.       Dorsalis pedis pulses are 1+ on the right side and 1+ on the left side.       Posterior tibial pulses are 0 on the right side and 0 on the left side.     Heart sounds: Normal  heart sounds. No murmur heard. Pulmonary:     Effort: Pulmonary effort is normal.     Breath sounds: Normal breath sounds. No wheezing or rales.  Musculoskeletal:     Right lower leg: No edema.     Left lower leg: No edema.            Assessment & Recommendations:   72 y/o [place] female with hypertension, hyperlipidemia, prior tobacco abuse, mod carotid artery stenosis, PAD  Chest pain: No recurrence  Carotid artery stenosis: Mod b/l carotid artery stenoses. Asymptomatic. Continue Plavix, statin Carotid duplex in 04/2023.  PAD: Multilevel disease, likely inflow and outflow. No claudication. Continue medical management. Increase Crestor to 40 mg daily. Repeat lipid panel in 3 months.  F/u in 6 months    Nigel Mormon, MD Pager: [phone] Office: [phone]

## 2022-08-13 ENCOUNTER — Ambulatory Visit: Payer: 59 | Admitting: Physical Therapy

## 2022-09-01 ENCOUNTER — Ambulatory Visit: Payer: 59 | Attending: Nurse Practitioner

## 2022-09-01 ENCOUNTER — Other Ambulatory Visit: Payer: Self-pay

## 2022-09-01 DIAGNOSIS — M542 Cervicalgia: Secondary | ICD-10-CM | POA: Diagnosis present

## 2022-09-01 NOTE — Therapy (Signed)
OUTPATIENT PHYSICAL THERAPY CERVICAL EVALUATION   Patient Name: Christina Solis MRN: [id number] DOB:15-Aug-1950, 72 y.o., female Today's Date: 09/01/2022  END OF SESSION:  PT End of Session - 09/01/22 1131     Visit Number 1    Number of Visits 13    PT Start Time 1131    PT Stop Time 1214    PT Time Calculation (min) 43 min    Activity Tolerance Patient tolerated treatment well    Behavior During Therapy WFL for tasks assessed/performed             Past Medical History:  Diagnosis Date   Anemia    sickle cell trait   Anxiety    Arthritis    "knees" (10/12/2013)   CHF (congestive heart failure)    Chronic back pain    Chronic bronchitis    "got it q yr when I lived in [place]"   Complication of anesthesia    hard to put under   COPD (chronic obstructive pulmonary disease)    Dysrhythmia    Family history of anesthesia complication    sister hard to wake up   Family history of anesthesia complication    niece have itching   Fibromyalgia    GERD (gastroesophageal reflux disease)    Hyperlipidemia    Hypertension    Irregular heart beat    Migraine    "used to get them alot; not regular anymore" (10/12/2013)   PAD (peripheral artery disease)    Pituitary tumor    followed at [facility]; on cabergoline 09/2013   Pneumonia    "twice"   Type II diabetes mellitus    Past Surgical History:  Procedure Laterality Date   ABDOMINAL AORTAGRAM N/A 06/28/2012   Procedure: ABDOMINAL Ronny Flurry;  Surgeon: Pamella Pert, MD;  Location: [facility] CATH LAB;  Service: Cardiovascular;  Laterality: N/A;   APPENDECTOMY     BACK SURGERY     x 2, lower back   BILATERAL CARPAL TUNNEL RELEASE     CARDIAC CATHETERIZATION     CATARACT EXTRACTION Left 2023   CATARACT EXTRACTION Right 2023   CESAREAN SECTION  1974; 1976; 1978   CHOLECYSTECTOMY N/A 10/12/2013   Procedure: LAPAROSCOPIC CHOLECYSTECTOMY WITH INTRAOPERATIVE CHOLANGIOGRAM;  Surgeon: Wilmon Arms. Corliss Skains, MD;  Location:  [facility] OR;  Service: General;  Laterality: N/A;   COLONOSCOPY     FEMORAL ARTERY STENT Left 06/28/2012   SFA   LAPAROSCOPIC CHOLECYSTECTOMY  10/12/2013   LEFT HEART CATHETERIZATION WITH CORONARY ANGIOGRAM N/A 06/28/2012   Procedure: LEFT HEART CATHETERIZATION WITH CORONARY ANGIOGRAM;  Surgeon: Pamella Pert, MD;  Location[facility] CATH LAB;  Service: Cardiovascular;  Laterality: N/A;   LOWER EXTREMITY ANGIOGRAM N/A 06/28/2012   Procedure: LOWER EXTREMITY ANGIOGRAM;  Surgeon: Pamella Pert, MD;  Location: [facility] CATH LAB;  Service: Cardiovascular;  Laterality: N/A;   PERIPHERAL VASCULAR CATHETERIZATION N/A 09/10/2015   Procedure: Abdominal Aortogram w/Lower Extremity;  Surgeon: Yates Decamp, MD;  Location: [facility] INVASIVE CV LAB;  Service: Cardiovascular;  Laterality: N/A;   POSTERIOR LUMBAR FUSION     REPLACEMENT TOTAL KNEE Left    TONSILLECTOMY     TUBAL LIGATION  1978   Patient Active Problem List   Diagnosis Date Noted   Preop cardiovascular exam 12/12/2021   Type 2 diabetes mellitus with stage 3a chronic kidney disease, with long-term current use of insulin 06/24/2020   Mixed hyperlipidemia 06/24/2020   Asymptomatic bilateral carotid artery stenosis 02/21/2020   Calcification of right carotid  artery 12/07/2019   COPD, group B, by GOLD 2017 classification 07/06/2019   Multiple lung nodules 07/06/2019   Diabetes mellitus 05/03/2019   Type 2 diabetes mellitus with diabetic polyneuropathy, with long-term current use of insulin 05/03/2019   Screening for viral disease 03/22/2019   Encounter for current long term use of antiplatelet drug 03/22/2019   Esophageal dysphagia 03/22/2019   Gastroesophageal reflux disease 03/22/2019   Hx of colonic polyps 03/22/2019   Atrial tachycardia    Left bundle branch block    Chronic diastolic CHF (congestive heart failure)    Uncontrolled type 2 diabetes mellitus with complication    CKD (chronic kidney disease), stage III 02/02/2016   Anemia, unspecified  04/08/2015   Hemorrhoids 04/08/2015   Osteoarthritis of right knee 04/05/2015   HTN (hypertension) 04/05/2015   Neuropathy in diabetes 04/05/2015   Hyperlipidemia 04/05/2015   Pain and swelling of left lower leg 04/05/2015   Carpal tunnel syndrome of left wrist 12/18/2014   Pain in left knee 12/18/2014   Vitamin D deficiency 11/29/2014   Hyperlipidemia associated with type 2 diabetes mellitus 08/31/2014   Chronic cholecystitis with calculus 09/15/2013   Disorder of synovium, tendon, and bursa 09/14/2013   Feces contents abnormal 09/14/2013   Former smoker 09/14/2013   Morbid obesity with BMI of 40.0-44.9, adult 09/06/2013   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 07/10/2013   Constipation 11/08/2012   Insulin dependent diabetes mellitus (HCC) 06/28/2012   PAD (peripheral artery disease) (HCC) 06/28/2012   Intermittent claudication 06/28/2012   Fibromyalgia 06/20/2012   Pain of upper extremity 06/20/2012   Lumbar postlaminectomy syndrome 06/20/2012   Chronic pain 04/20/2012   Migraine 03/31/2012    PCP: Iona Hansen, NP  REFERRING PROVIDER: Janeece Riggers, FNP  REFERRING DIAG: Cervicalgia [M54.2]   THERAPY DIAG:  Cervicalgia  Rationale for Evaluation and Treatment: Rehabilitation  ONSET DATE: 09/01/22  SUBJECTIVE:                                                                                                                                                                                                         SUBJECTIVE STATEMENT: Patient reports to PT due to persistent neck pain that has been present off and on for 6 months. "I have had the shoulder pain off and on for years." She does note that her bilateral shoulder pain has improved since the IA injections on 07/17/22.   Hand dominance: Left  PERTINENT HISTORY:  PMHX includes Anxiety, CHF, COPD, Fibromyalgia, HTN, Migraines, Type 2 Diabetes, Stage 3 CKD, Diabetic  Polyneuropathy, GERD,   Carpal tunnel syndrome, Obesity  PAIN:  Are you having pain? Yes: NPRS scale: 3-9/10 Pain location: posterior and Rt side of neck  Pain description: dull, aching, stiffness, & intermittent spasms in bicep Aggravating factors: shoulder pain  Relieving factors: massage, hot compress, pain medication, IA injections    PRECAUTIONS: None  WEIGHT BEARING RESTRICTIONS: No  FALLS:  Has patient fallen in last 6 months? No  LIVING ENVIRONMENT: Lives with: lives with their family Lives in: House/apartment Stairs: No Has following equipment at home: None  OCCUPATION: Retired    PATIENT GOALS: "To not be in so much pain."   NEXT MD VISIT: 09/15/22   OBJECTIVE:   DIAGNOSTIC FINDINGS:  C-Spine MRI 2021 Multilevel degenerative changes with NF stenosis C4-5  PATIENT SURVEYS:  FOTO for cervical functional limitations to be provided at follow up visit  COGNITION: Overall cognitive status: Within functional limits for tasks assessed   POSTURE: rounded shoulders while seated  PALPATION: Patient has moderate tenderness to palpation along C5-C7 spinous process and minimal tenderness with cervical paraspinals.    CERVICAL ROM:   Active ROM A/PROM (deg) eval  Flexion WFL  Extension WFL  Right lateral flexion 40  Left lateral flexion 30  Right rotation WFL  Left rotation WFL   (Blank rows = not tested)   [facility] Adult PT Treatment:                                                DATE: 09/01/2022  Therapeutic Exercise: Supine cervical retraction 2 x 10 with pillow, verbal/visual/tactile cueing for instruction and correct performance  Seated upper trap stretch, 2 x 30 sec bilaterally  Therapeutic Activity: Patient education regarding pain history and pain science education.  Additional patient education as described below.    PATIENT EDUCATION:  Education details: provided and reviewed initial HEP and goal of cervical exercises. Discussed prognosis and POC.  Person educated:  Patient Education method: Explanation, Demonstration, and Handouts Education comprehension: verbalized understanding, returned demonstration, verbal cues required, and tactile cues required  HOME EXERCISE PROGRAM: Access Code: [email or web address] URL: [email or web address] Date: 09/01/2022 Prepared by: [name]  Exercises - Supine Cervical Retraction with Towel  - 2 x daily - 7 x weekly - 2 sets - 10 reps - 3 sec hold - Seated Cervical Retraction  - 2 x daily - 7 x weekly - 2 sets - 10 reps - 3 sec hold - Seated Upper Trapezius Stretch  - 2 x daily - 7 x weekly - 3 reps - 20-30 sec hold  ASSESSMENT:  CLINICAL IMPRESSION: Patient is a 72 y.o. female who was seen today for physical therapy evaluation and treatment for persistent neck pain. She is demonstrating decreased L>R cervical lateral flexion, decreased postural endurance, and moderate tenderness through lower cervical spine. She has related functional limitations that includes pain and difficulty with prolonged reading, carrying, and lifting. She responded well to initial exercises today and reported decreased pain with AROM at end of today's session. Patient will benefit from skilled Physical Therapy services to address relevant deficits, pain management and improved QOL.    OBJECTIVE IMPAIRMENTS: decreased activity tolerance, decreased mobility, decreased ROM, decreased strength, impaired UE functional use, postural dysfunction, and pain.   ACTIVITY LIMITATIONS: carrying, lifting, and prolonged reading  PARTICIPATION LIMITATIONS: meal prep, cleaning, and normal recreational activity   PERSONAL FACTORS:  Age, Past/current experiences, Time since onset of injury/illness/exacerbation, and 3+ comorbidities: PMHX includes Anxiety, CHF, COPD, Fibromyalgia, HTN, Migraines, Type 2 Diabetes, Stage 3 CKD, Diabetic Polyneuropathy, GERD,  Carpal tunnel syndrome, Obesity are also affecting patient's functional outcome.   REHAB POTENTIAL:  Fair  CLINICAL DECISION MAKING: Evolving/moderate complexity  EVALUATION COMPLEXITY: Moderate   GOALS: Goals reviewed with patient? Yes  SHORT TERM GOALS: Target date: 09/22/2022   Patient will be independent with initial HEP for improved cervical mobility and pain modulation.  Baseline:  Goal status: INITIAL  2.  Patient will demonstrate improved Lt lateral flexion AROM by at least 5 degrees.  Baseline: 30 deg Goal status: INITIAL    LONG TERM GOALS: Target date: 10/17/2022    Patient will report ability to look down for reading and word search puzzle for up to 20 minutes with minimal-to-no pain.  Baseline: unable  Goal status: INITIAL  2.  Patient will demonstrated cervical lateral flexion AROM at least 40 degrees bilaterally with minimal-to-no pain at end range.  Goal status: INITIAL  3.  Patient will report ability to perform at least 60 minutes of normal household chores and ADLs without exacerbation of symptoms.  Goal status: INITIAL  4.  Patient will be independent with HEP for ongoing symptom management and further improvement of activity tolerance.  Goal status: INITIAL     PLAN:  PT FREQUENCY: 2x/week  PT DURATION: 6 weeks  PLANNED INTERVENTIONS: Therapeutic exercises, Therapeutic activity, Neuromuscular re-education, Patient/Family education, Self Care, Joint mobilization, Dry Needling, Electrical stimulation, Cryotherapy, Moist heat, Taping, Traction, Manual therapy, and Re-evaluation  PLAN FOR NEXT SESSION: Provide Cervical Spine FOTO patient-reported outcome measure, continue with cervical mobility and isometric strengthening activities. Continue pain neuroscience education, conservative pain management, and address activity tolerance as tolerated.   Per MD referral, pt will return to [facility] for assessment after 6 treatment sessions.  We will fax each physical therapy note to referring provider.   [name], PT, DPT 09/01/2022, 3:38 PM

## 2022-09-09 ENCOUNTER — Ambulatory Visit: Payer: 59

## 2022-09-09 DIAGNOSIS — M542 Cervicalgia: Secondary | ICD-10-CM | POA: Diagnosis not present

## 2022-09-09 NOTE — Therapy (Signed)
OUTPATIENT PHYSICAL THERAPY TREATMENT NOTE   Patient Name: Christina Solis MRN: [id number] DOB:1951-02-29, 72 y.o., female Today's Date: 09/09/2022  PCP: Iona Hansen, NP  REFERRING PROVIDER: Janeece Riggers, FNP   END OF SESSION:   PT End of Session - 09/09/22 1045     Visit Number 2    Number of Visits 13    PT Start Time 1045    PT Stop Time 1130    PT Time Calculation (min) 45 min    Activity Tolerance Patient tolerated treatment well    Behavior During Therapy WFL for tasks assessed/performed             Past Medical History:  Diagnosis Date   Anemia    sickle cell trait   Anxiety    Arthritis    "knees" (10/12/2013)   CHF (congestive heart failure)    Chronic back pain    Chronic bronchitis    "got it q yr when I lived in [place]"   Complication of anesthesia    hard to put under   COPD (chronic obstructive pulmonary disease)    Dysrhythmia    Family history of anesthesia complication    sister hard to wake up   Family history of anesthesia complication    niece have itching   Fibromyalgia    GERD (gastroesophageal reflux disease)    Hyperlipidemia    Hypertension    Irregular heart beat    Migraine    "used to get them alot; not regular anymore" (10/12/2013)   PAD (peripheral artery disease)    Pituitary tumor    followed at [facility]ology; on cabergoline 09/2013   Pneumonia    "twice"   Type II diabetes mellitus    Past Surgical History:  Procedure Laterality Date   ABDOMINAL AORTAGRAM N/A 06/28/2012   Procedure: ABDOMINAL Ronny Flurry;  Surgeon: Pamella Pert, MD;  Location: [facility] CATH LAB;  Service: Cardiovascular;  Laterality: N/A;   APPENDECTOMY     BACK SURGERY     x 2, lower back   BILATERAL CARPAL TUNNEL RELEASE     CARDIAC CATHETERIZATION     CATARACT EXTRACTION Left 2023   CATARACT EXTRACTION Right 2023   CESAREAN SECTION  1974; 1976; 1978   CHOLECYSTECTOMY N/A 10/12/2013   Procedure: LAPAROSCOPIC CHOLECYSTECTOMY WITH  INTRAOPERATIVE CHOLANGIOGRAM;  Surgeon: Wilmon Arms. Corliss Skains, MD;  Location: [facility] OR;  Service: General;  Laterality: N/A;   COLONOSCOPY     FEMORAL ARTERY STENT Left 06/28/2012   SFA   LAPAROSCOPIC CHOLECYSTECTOMY  10/12/2013   LEFT HEART CATHETERIZATION WITH CORONARY ANGIOGRAM N/A 06/28/2012   Procedure: LEFT HEART CATHETERIZATION WITH CORONARY ANGIOGRAM;  Surgeon: Pamella Pert, MD;  Location: [facility] CATH LAB;  Service: Cardiovascular;  Laterality: N/A;   LOWER EXTREMITY ANGIOGRAM N/A 06/28/2012   Procedure: LOWER EXTREMITY ANGIOGRAM;  Surgeon: Pamella Pert, MD;  Location: [facility] CATH LAB;  Service: Cardiovascular;  Laterality: N/A;   PERIPHERAL VASCULAR CATHETERIZATION N/A 09/10/2015   Procedure: Abdominal Aortogram w/Lower Extremity;  Surgeon: Yates Decamp, MD;  Location: [facility] INVASIVE CV LAB;  Service: Cardiovascular;  Laterality: N/A;   POSTERIOR LUMBAR FUSION     REPLACEMENT TOTAL KNEE Left    TONSILLECTOMY     TUBAL LIGATION  1978   Patient Active Problem List   Diagnosis Date Noted   Preop cardiovascular exam 12/12/2021   Type 2 diabetes mellitus with stage 3a chronic kidney disease, with long-term current use of insulin 06/24/2020  Mixed hyperlipidemia 06/24/2020   Asymptomatic bilateral carotid artery stenosis 02/21/2020   Calcification of right carotid artery 12/07/2019   COPD, group B, by GOLD 2017 classification 07/06/2019   Multiple lung nodules 07/06/2019   Diabetes mellitus 05/03/2019   Type 2 diabetes mellitus with diabetic polyneuropathy, with long-term current use of insulin 05/03/2019   Screening for viral disease 03/22/2019   Encounter for current long term use of antiplatelet drug 03/22/2019   Esophageal dysphagia 03/22/2019   Gastroesophageal reflux disease 03/22/2019   Hx of colonic polyps 03/22/2019   Atrial tachycardia    Left bundle branch block    Chronic diastolic CHF (congestive heart failure)    Uncontrolled type 2 diabetes mellitus with complication    CKD  (chronic kidney disease), stage III 02/02/2016   Anemia, unspecified 04/08/2015   Hemorrhoids 04/08/2015   Osteoarthritis of right knee 04/05/2015   HTN (hypertension) 04/05/2015   Neuropathy in diabetes 04/05/2015   Hyperlipidemia 04/05/2015   Pain and swelling of left lower leg 04/05/2015   Carpal tunnel syndrome of left wrist 12/18/2014   Pain in left knee 12/18/2014   Vitamin D deficiency 11/29/2014   Hyperlipidemia associated with type 2 diabetes mellitus 08/31/2014   Chronic cholecystitis with calculus 09/15/2013   Disorder of synovium, tendon, and bursa 09/14/2013   Feces contents abnormal 09/14/2013   Former smoker 09/14/2013   Morbid obesity with BMI of 40.0-44.9, adult 09/06/2013   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 07/10/2013   Constipation 11/08/2012   Insulin dependent diabetes mellitus (HCC) 06/28/2012   PAD (peripheral artery disease) (HCC) 06/28/2012   Intermittent claudication 06/28/2012   Fibromyalgia 06/20/2012   Pain of upper extremity 06/20/2012   Lumbar postlaminectomy syndrome 06/20/2012   Chronic pain 04/20/2012   Migraine 03/31/2012    REFERRING DIAG: Cervicalgia [M54.2]   THERAPY DIAG:  Cervicalgia  Rationale for Evaluation and Treatment Rehabilitation  PERTINENT HISTORY:  PMHX includes Anxiety, CHF, COPD, Fibromyalgia, HTN, Migraines, Type 2 Diabetes, Stage 3 CKD, Diabetic Polyneuropathy, GERD,  Carpal tunnel syndrome, Obesity  PRECAUTIONS: None  SUBJECTIVE:                                                                                                                                                                                      SUBJECTIVE STATEMENT: Patient reports that she has been able to do some cooking over the weekend, but she woke up this morning with her right shoulder in a lot of pain.  PAIN:  Are you having pain? Yes: NPRS scale: 2/10 neck, R shoulder 9/10, L shoulder 7/10 Pain location: posterior  and Rt side of neck  Pain description: dull,  aching, stiffness, & intermittent spasms in bicep Aggravating factors: shoulder pain  Relieving factors: massage, hot compress, pain medication, IA injections     OBJECTIVE: (objective measures completed at initial evaluation unless otherwise dated)   DIAGNOSTIC FINDINGS:  C-Spine MRI 2021 Multilevel degenerative changes with NF stenosis C4-5  PATIENT SURVEYS:  FOTO for cervical functional limitations to be provided at follow up visit  COGNITION: Overall cognitive status: Within functional limits for tasks assessed   POSTURE: rounded shoulders while seated  PALPATION: Patient has moderate tenderness to palpation along C5-C7 spinous process and minimal tenderness with cervical paraspinals.    CERVICAL ROM:   Active ROM A/PROM (deg) eval  Flexion WFL  Extension WFL  Right lateral flexion 40  Left lateral flexion 30  Right rotation WFL  Left rotation WFL   (Blank rows = not tested)  [facility] Adult PT Treatment:                                                DATE: 09/09/2022  Therapeutic Exercise: UBE level 0-1 fwd/backwards, 4' each Seated physioball rolling for shoulder AAROM/stretch  Seated UT stretch, 3 x 30 sec each side  Seated levator stretch, 3 x 30 sec each side  Supine Cervical Retraction 15x with 3 sec hold   Manual Therapy: STM to bilateral UT, levator, scalene and cervical paraspinals  Manual UT, levator stretch with grade I Rt 1st rib mobs   [facility] Adult PT Treatment:                                                DATE: 09/01/2022  Therapeutic Exercise: Supine cervical retraction 2 x 10 with pillow, verbal/visual/tactile cueing for instruction and correct performance  Seated upper trap stretch, 2 x 30 sec bilaterally  Therapeutic Activity: Patient education regarding pain history and pain science education.  Additional patient education as described below.    PATIENT EDUCATION:  Education details: provided  and reviewed initial HEP and goal of cervical exercises. Discussed prognosis and POC.  Person educated: Patient Education method: Explanation, Demonstration, and Handouts Education comprehension: verbalized understanding, returned demonstration, verbal cues required, and tactile cues required  HOME EXERCISE PROGRAM: Access Code: [email or web address] URL: [email or web address] Date: 09/01/2022 Prepared by: [name]  Exercises - Supine Cervical Retraction with Towel  - 2 x daily - 7 x weekly - 2 sets - 10 reps - 3 sec hold - Seated Cervical Retraction  - 2 x daily - 7 x weekly - 2 sets - 10 reps - 3 sec hold - Seated Upper Trapezius Stretch  - 2 x daily - 7 x weekly - 3 reps - 20-30 sec hold  ASSESSMENT:  CLINICAL IMPRESSION:  Patient tolerated initial treatment session well today. She continues to requires some verbal and visual feedback for correct performance of exercises. She responded well to manual therapy at end of session with increased muscle tightness at end of session. We will reassess response to treatment and progress as tolerated.   OBJECTIVE IMPAIRMENTS: decreased activity tolerance, decreased mobility, decreased ROM, decreased strength, impaired UE functional use, postural dysfunction, and pain.   ACTIVITY LIMITATIONS: carrying, lifting, and prolonged reading  PARTICIPATION LIMITATIONS: meal prep, cleaning, and normal recreational activity   PERSONAL  FACTORS: Age, Past/current experiences, Time since onset of injury/illness/exacerbation, and 3+ comorbidities: PMHX includes Anxiety, CHF, COPD, Fibromyalgia, HTN, Migraines, Type 2 Diabetes, Stage 3 CKD, Diabetic Polyneuropathy, GERD,  Carpal tunnel syndrome, Obesity are also affecting patient's functional outcome.   REHAB POTENTIAL: Fair  CLINICAL DECISION MAKING: Evolving/moderate complexity  EVALUATION COMPLEXITY: Moderate   GOALS: Goals reviewed with patient? Yes  SHORT TERM GOALS: Target date:  09/22/2022   Patient will be independent with initial HEP for improved cervical mobility and pain modulation.  Baseline:  Goal status: INITIAL  2.  Patient will demonstrate improved Lt lateral flexion AROM by at least 5 degrees.  Baseline: 30 deg Goal status: INITIAL    LONG TERM GOALS: Target date: 10/17/2022    Patient will report ability to look down for reading and word search puzzle for up to 20 minutes with minimal-to-no pain.  Baseline: unable  Goal status: INITIAL  2.  Patient will demonstrated cervical lateral flexion AROM at least 40 degrees bilaterally with minimal-to-no pain at end range.  Goal status: INITIAL  3.  Patient will report ability to perform at least 60 minutes of normal household chores and ADLs without exacerbation of symptoms.  Goal status: INITIAL  4.  Patient will be independent with HEP for ongoing symptom management and further improvement of activity tolerance.  Goal status: INITIAL   PLAN:  PT FREQUENCY: 2x/week  PT DURATION: 6 weeks  PLANNED INTERVENTIONS: Therapeutic exercises, Therapeutic activity, Neuromuscular re-education, Patient/Family education, Self Care, Joint mobilization, Dry Needling, Electrical stimulation, Cryotherapy, Moist heat, Taping, Traction, Manual therapy, and Re-evaluation  PLAN FOR NEXT SESSION: continue with cervical mobility and isometric strengthening activities. Continue pain neuroscience education, conservative pain management, and address activity tolerance as tolerated.    [name], PT, DPT 09/09/2022, 11:39 AM

## 2022-09-11 ENCOUNTER — Ambulatory Visit: Payer: 59

## 2022-09-11 DIAGNOSIS — M542 Cervicalgia: Secondary | ICD-10-CM

## 2022-09-11 NOTE — Therapy (Signed)
OUTPATIENT PHYSICAL THERAPY TREATMENT NOTE   Patient Name: Christina Solis MRN: [id number] DOB:Aug 22, 1950, 72 y.o., female Today's Date: 09/11/2022  PCP: Iona Hansen, NP  REFERRING PROVIDER: Janeece Riggers, FNP   END OF SESSION:   PT End of Session - 09/11/22 0911     Visit Number 3    Number of Visits 13    PT Start Time 0913    PT Stop Time 1000    PT Time Calculation (min) 47 min    Activity Tolerance Patient tolerated treatment well    Behavior During Therapy WFL for tasks assessed/performed              Past Medical History:  Diagnosis Date   Anemia    sickle cell trait   Anxiety    Arthritis    "knees" (10/12/2013)   CHF (congestive heart failure) (HCC)    Chronic back pain    Chronic bronchitis (HCC)    "got it q yr when I lived in [place]"   Complication of anesthesia    hard to put under   COPD (chronic obstructive pulmonary disease) (HCC)    Dysrhythmia    Family history of anesthesia complication    sister hard to wake up   Family history of anesthesia complication    niece have itching   Fibromyalgia    GERD (gastroesophageal reflux disease)    Hyperlipidemia    Hypertension    Irregular heart beat    Migraine    "used to get them alot; not regular anymore" (10/12/2013)   PAD (peripheral artery disease) (HCC)    Pituitary tumor    followed at [facility] Endocrinology; on cabergoline 09/2013   Pneumonia    "twice"   Type II diabetes mellitus (HCC)    Past Surgical History:  Procedure Laterality Date   ABDOMINAL AORTAGRAM N/A 06/28/2012   Procedure: ABDOMINAL Ronny Flurry;  Surgeon: Pamella Pert, MD;  Location: [facility] CATH LAB;  Service: Cardiovascular;  Laterality: N/A;   APPENDECTOMY     BACK SURGERY     x 2, lower back   BILATERAL CARPAL TUNNEL RELEASE     CARDIAC CATHETERIZATION     CATARACT EXTRACTION Left 2023   CATARACT EXTRACTION Right 2023   CESAREAN SECTION  1974; 1976; 1978   CHOLECYSTECTOMY N/A 10/12/2013   Procedure:  LAPAROSCOPIC CHOLECYSTECTOMY WITH INTRAOPERATIVE CHOLANGIOGRAM;  Surgeon: Wilmon Arms. Corliss Skains, MD;  Location: [facility] OR;  Service: General;  Laterality: N/A;   COLONOSCOPY     FEMORAL ARTERY STENT Left 06/28/2012   SFA   LAPAROSCOPIC CHOLECYSTECTOMY  10/12/2013   LEFT HEART CATHETERIZATION WITH CORONARY ANGIOGRAM N/A 06/28/2012   Procedure: LEFT HEART CATHETERIZATION WITH CORONARY ANGIOGRAM;  Surgeon: Pamella Pert, MD;  Location: [facility] CATH LAB;  Service: Cardiovascular;  Laterality: N/A;   LOWER EXTREMITY ANGIOGRAM N/A 06/28/2012   Procedure: LOWER EXTREMITY ANGIOGRAM;  Surgeon: Pamella Pert, MD;  Location: [facility] CATH LAB;  Service: Cardiovascular;  Laterality: N/A;   PERIPHERAL VASCULAR CATHETERIZATION N/A 09/10/2015   Procedure: Abdominal Aortogram w/Lower Extremity;  Surgeon: Yates Decamp, MD;  Location: [facility] INVASIVE CV LAB;  Service: Cardiovascular;  Laterality: N/A;   POSTERIOR LUMBAR FUSION     REPLACEMENT TOTAL KNEE Left    TONSILLECTOMY     TUBAL LIGATION  1978   Patient Active Problem List   Diagnosis Date Noted   Preop cardiovascular exam 12/12/2021   Type 2 diabetes mellitus with stage 3a chronic kidney disease, with long-term current  use of insulin (HCC) 06/24/2020   Mixed hyperlipidemia 06/24/2020   Asymptomatic bilateral carotid artery stenosis 02/21/2020   Calcification of right carotid artery 12/07/2019   COPD, group B, by GOLD 2017 classification (HCC) 07/06/2019   Multiple lung nodules 07/06/2019   Diabetes mellitus (HCC) 05/03/2019   Type 2 diabetes mellitus with diabetic polyneuropathy, with long-term current use of insulin (HCC) 05/03/2019   Screening for viral disease 03/22/2019   Encounter for current long term use of antiplatelet drug 03/22/2019   Esophageal dysphagia 03/22/2019   Gastroesophageal reflux disease 03/22/2019   Hx of colonic polyps 03/22/2019   Atrial tachycardia    Left bundle branch block    Chronic diastolic CHF (congestive heart failure) (HCC)     Uncontrolled type 2 diabetes mellitus with complication    CKD (chronic kidney disease), stage III (HCC) 02/02/2016   Anemia, unspecified 04/08/2015   Hemorrhoids 04/08/2015   Osteoarthritis of right knee 04/05/2015   HTN (hypertension) 04/05/2015   Neuropathy in diabetes (HCC) 04/05/2015   Hyperlipidemia 04/05/2015   Pain and swelling of left lower leg 04/05/2015   Carpal tunnel syndrome of left wrist 12/18/2014   Pain in left knee 12/18/2014   Vitamin D deficiency 11/29/2014   Hyperlipidemia associated with type 2 diabetes mellitus (HCC) 08/31/2014   Chronic cholecystitis with calculus 09/15/2013   Disorder of synovium, tendon, and bursa 09/14/2013   Feces contents abnormal 09/14/2013   Former smoker 09/14/2013   Morbid obesity with BMI of 40.0-44.9, adult (HCC) 09/06/2013   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) 07/10/2013   Constipation 11/08/2012   Insulin dependent diabetes mellitus (HCC) 06/28/2012   PAD (peripheral artery disease) (HCC) 06/28/2012   Intermittent claudication (HCC) 06/28/2012   Fibromyalgia 06/20/2012   Pain of upper extremity 06/20/2012   Lumbar postlaminectomy syndrome 06/20/2012   Chronic pain 04/20/2012   Migraine 03/31/2012    REFERRING DIAG: Cervicalgia [M54.2]   THERAPY DIAG:  Cervicalgia  Rationale for Evaluation and Treatment Rehabilitation  PERTINENT HISTORY:  PMHX includes Anxiety, CHF, COPD, Fibromyalgia, HTN, Migraines, Type 2 Diabetes, Stage 3 CKD, Diabetic Polyneuropathy, GERD,  Carpal tunnel syndrome, Obesity  PRECAUTIONS: None  SUBJECTIVE:                                                                                                                                                                                      SUBJECTIVE STATEMENT: Patient states that she took a nap after her last visit. She said she woke up feeling better and was able to do some cooking. She states that her both shoulders and her  neck are feeling a little better, but her right  shoulder still hurts worse than the left.   PAIN:  Are you having pain? Yes: NPRS scale: 2/10 neck, R shoulder 9/10, L shoulder 7/10 Pain location: posterior and Rt side of neck  Pain description: dull, aching, stiffness, & intermittent spasms in bicep Aggravating factors: shoulder pain  Relieving factors: massage, hot compress, pain medication, IA injections     OBJECTIVE: (objective measures completed at initial evaluation unless otherwise dated)   DIAGNOSTIC FINDINGS:  C-Spine MRI 2021 Multilevel degenerative changes with NF stenosis C4-5  PATIENT SURVEYS:  FOTO for cervical functional limitations to be provided at follow up visit  COGNITION: Overall cognitive status: Within functional limits for tasks assessed   POSTURE: rounded shoulders while seated  PALPATION: Patient has moderate tenderness to palpation along C5-C7 spinous process and minimal tenderness with cervical paraspinals.    CERVICAL ROM:   Active ROM A/PROM (deg) eval  Flexion WFL  Extension WFL  Right lateral flexion 40  Left lateral flexion 30  Right rotation WFL  Left rotation WFL   (Blank rows = not tested)  [facility] Adult PT Treatment:                                                DATE: 09/11/2022  Therapeutic Exercise: UBE level 2 fwd/backwards, 4' each Seated physioball rolling for shoulder AAROM/stretch  Seated UT stretch, 3 x 30 sec each side  Seated levator stretch, 3 x 30 sec each side  Seated Scap Retraction x 20 Seated Rows with Red Theraband, 2 x 10  Supine Cervical Retraction 15x with 3 sec hold   Manual Therapy: STM to bilateral UT, levator, scalene and cervical paraspinals  Manual UT, levator stretch with grade I Rt 1st rib mobs   [facility] Adult PT Treatment:                                                DATE: 09/09/2022  Therapeutic Exercise: UBE level 0-1 fwd/backwards, 4' each Seated physioball rolling for shoulder AAROM/stretch   Seated UT stretch, 3 x 30 sec each side  Seated levator stretch, 3 x 30 sec each side  Supine Cervical Retraction 15x with 3 sec hold   Manual Therapy: STM to bilateral UT, levator, scalene and cervical paraspinals  Manual UT, levator stretch with grade I Rt 1st rib mobs   [facility] Adult PT Treatment:                                                DATE: 09/01/2022  Therapeutic Exercise: Supine cervical retraction 2 x 10 with pillow, verbal/visual/tactile cueing for instruction and correct performance  Seated upper trap stretch, 2 x 30 sec bilaterally  Therapeutic Activity: Patient education regarding pain history and pain science education.  Additional patient education as described below.    PATIENT EDUCATION:  Education details: provided and reviewed initial HEP and goal of cervical exercises. Discussed prognosis and POC.  Person educated: Patient Education method: Explanation, Demonstration, and Handouts Education comprehension: verbalized understanding, returned demonstration, verbal cues required, and tactile cues required  HOME EXERCISE PROGRAM: Access Code: [email or web address] URL:  [email or web address] Date: 09/01/2022 Prepared by: [name]  Exercises - Supine Cervical Retraction with Towel  - 2 x daily - 7 x weekly - 2 sets - 10 reps - 3 sec hold - Seated Cervical Retraction  - 2 x daily - 7 x weekly - 2 sets - 10 reps - 3 sec hold - Seated Upper Trapezius Stretch  - 2 x daily - 7 x weekly - 3 reps - 20-30 sec hold  ASSESSMENT:  CLINICAL IMPRESSION: Patient was able to tolerate increased resistance with UBE at start of session and initiation of resisted scapular retraction exercises. She benefits from occassional verbal cues and visual feedback for correct performance of exercises. We will continue with manual therapy for pain modulation and decreased muscle tension as indication.    OBJECTIVE IMPAIRMENTS: decreased activity tolerance, decreased mobility,  decreased ROM, decreased strength, impaired UE functional use, postural dysfunction, and pain.   ACTIVITY LIMITATIONS: carrying, lifting, and prolonged reading  PARTICIPATION LIMITATIONS: meal prep, cleaning, and normal recreational activity   PERSONAL FACTORS: Age, Past/current experiences, Time since onset of injury/illness/exacerbation, and 3+ comorbidities: PMHX includes Anxiety, CHF, COPD, Fibromyalgia, HTN, Migraines, Type 2 Diabetes, Stage 3 CKD, Diabetic Polyneuropathy, GERD,  Carpal tunnel syndrome, Obesity are also affecting patient's functional outcome.   REHAB POTENTIAL: Fair  CLINICAL DECISION MAKING: Evolving/moderate complexity  EVALUATION COMPLEXITY: Moderate   GOALS: Goals reviewed with patient? Yes  SHORT TERM GOALS: Target date: 09/22/2022   Patient will be independent with initial HEP for improved cervical mobility and pain modulation.  Baseline:  Goal status: INITIAL  2.  Patient will demonstrate improved Lt lateral flexion AROM by at least 5 degrees.  Baseline: 30 deg Goal status: INITIAL    LONG TERM GOALS: Target date: 10/17/2022    Patient will report ability to look down for reading and word search puzzle for up to 20 minutes with minimal-to-no pain.  Baseline: unable  Goal status: INITIAL  2.  Patient will demonstrated cervical lateral flexion AROM at least 40 degrees bilaterally with minimal-to-no pain at end range.  Goal status: INITIAL  3.  Patient will report ability to perform at least 60 minutes of normal household chores and ADLs without exacerbation of symptoms.  Goal status: INITIAL  4.  Patient will be independent with HEP for ongoing symptom management and further improvement of activity tolerance.  Goal status: INITIAL   PLAN:  PT FREQUENCY: 2x/week  PT DURATION: 6 weeks  PLANNED INTERVENTIONS: Therapeutic exercises, Therapeutic activity, Neuromuscular re-education, Patient/Family education, Self Care, Joint mobilization, Dry  Needling, Electrical stimulation, Cryotherapy, Moist heat, Taping, Traction, Manual therapy, and Re-evaluation  PLAN FOR NEXT SESSION: continue with cervical mobility and isometric strengthening activities. Continue pain neuroscience education, conservative pain management, and address activity tolerance as tolerated. Start NuStep next week    [name], PT, DPT 09/11/2022, 9:56 AM

## 2022-09-16 ENCOUNTER — Ambulatory Visit: Payer: 59

## 2022-09-18 ENCOUNTER — Ambulatory Visit: Payer: 59

## 2022-09-22 ENCOUNTER — Ambulatory Visit: Payer: 59 | Attending: Nurse Practitioner

## 2022-09-22 DIAGNOSIS — M542 Cervicalgia: Secondary | ICD-10-CM | POA: Diagnosis present

## 2022-09-22 NOTE — Therapy (Signed)
OUTPATIENT PHYSICAL THERAPY TREATMENT NOTE   Patient Name: Christina Solis MRN: [id number] DOB:February 28, 1951, 72 y.o., female Today's Date: 09/22/2022  PCP: Iona Hansen, NP  REFERRING PROVIDER: Janeece Riggers, FNP   END OF SESSION:   PT End of Session - 09/22/22 1139     Visit Number 4    Number of Visits 13    PT Start Time 1139   Pt arrived 9 mins late to session   PT Stop Time 1210    PT Time Calculation (min) 31 min    Activity Tolerance Patient tolerated treatment well    Behavior During Therapy WFL for tasks assessed/performed               Past Medical History:  Diagnosis Date   Anemia    sickle cell trait   Anxiety    Arthritis    "knees" (10/12/2013)   CHF (congestive heart failure) (HCC)    Chronic back pain    Chronic bronchitis (HCC)    "got it q yr when I lived in [place]"   Complication of anesthesia    hard to put under   COPD (chronic obstructive pulmonary disease) (HCC)    Dysrhythmia    Family history of anesthesia complication    sister hard to wake up   Family history of anesthesia complication    niece have itching   Fibromyalgia    GERD (gastroesophageal reflux disease)    Hyperlipidemia    Hypertension    Irregular heart beat    Migraine    "used to get them alot; not regular anymore" (10/12/2013)   PAD (peripheral artery disease) (HCC)    Pituitary tumor    followed at [facility] Endocrinology; on cabergoline 09/2013   Pneumonia    "twice"   Type II diabetes mellitus (HCC)    Past Surgical History:  Procedure Laterality Date   ABDOMINAL AORTAGRAM N/A 06/28/2012   Procedure: ABDOMINAL Ronny Flurry;  Surgeon: Pamella Pert, MD;  Location: [facility] CATH LAB;  Service: Cardiovascular;  Laterality: N/A;   APPENDECTOMY     BACK SURGERY     x 2, lower back   BILATERAL CARPAL TUNNEL RELEASE     CARDIAC CATHETERIZATION     CATARACT EXTRACTION Left 2023   CATARACT EXTRACTION Right 2023   CESAREAN SECTION  1974; 1976; 1978    CHOLECYSTECTOMY N/A 10/12/2013   Procedure: LAPAROSCOPIC CHOLECYSTECTOMY WITH INTRAOPERATIVE CHOLANGIOGRAM;  Surgeon: Wilmon Arms. Corliss Skains, MD;  Location: [facility] OR;  Service: General;  Laterality: N/A;   COLONOSCOPY     FEMORAL ARTERY STENT Left 06/28/2012   SFA   LAPAROSCOPIC CHOLECYSTECTOMY  10/12/2013   LEFT HEART CATHETERIZATION WITH CORONARY ANGIOGRAM N/A 06/28/2012   Procedure: LEFT HEART CATHETERIZATION WITH CORONARY ANGIOGRAM;  Surgeon: Pamella Pert, MD;  Location: [facility] CATH LAB;  Service: Cardiovascular;  Laterality: N/A;   LOWER EXTREMITY ANGIOGRAM N/A 06/28/2012   Procedure: LOWER EXTREMITY ANGIOGRAM;  Surgeon: Pamella Pert, MD;  Location: [facility] CATH LAB;  Service: Cardiovascular;  Laterality: N/A;   PERIPHERAL VASCULAR CATHETERIZATION N/A 09/10/2015   Procedure: Abdominal Aortogram w/Lower Extremity;  Surgeon: Yates Decamp, MD;  Location: [facility] INVASIVE CV LAB;  Service: Cardiovascular;  Laterality: N/A;   POSTERIOR LUMBAR FUSION     REPLACEMENT TOTAL KNEE Left    TONSILLECTOMY     TUBAL LIGATION  1978   Patient Active Problem List   Diagnosis Date Noted   Preop cardiovascular exam 12/12/2021   Type 2 diabetes mellitus  with stage 3a chronic kidney disease, with long-term current use of insulin (HCC) 06/24/2020   Mixed hyperlipidemia 06/24/2020   Asymptomatic bilateral carotid artery stenosis 02/21/2020   Calcification of right carotid artery 12/07/2019   COPD, group B, by GOLD 2017 classification (HCC) 07/06/2019   Multiple lung nodules 07/06/2019   Diabetes mellitus (HCC) 05/03/2019   Type 2 diabetes mellitus with diabetic polyneuropathy, with long-term current use of insulin (HCC) 05/03/2019   Screening for viral disease 03/22/2019   Encounter for current long term use of antiplatelet drug 03/22/2019   Esophageal dysphagia 03/22/2019   Gastroesophageal reflux disease 03/22/2019   Hx of colonic polyps 03/22/2019   Atrial tachycardia    Left bundle branch block    Chronic  diastolic CHF (congestive heart failure) (HCC)    Uncontrolled type 2 diabetes mellitus with complication    CKD (chronic kidney disease), stage III (HCC) 02/02/2016   Anemia, unspecified 04/08/2015   Hemorrhoids 04/08/2015   Osteoarthritis of right knee 04/05/2015   HTN (hypertension) 04/05/2015   Neuropathy in diabetes (HCC) 04/05/2015   Hyperlipidemia 04/05/2015   Pain and swelling of left lower leg 04/05/2015   Carpal tunnel syndrome of left wrist 12/18/2014   Pain in left knee 12/18/2014   Vitamin D deficiency 11/29/2014   Hyperlipidemia associated with type 2 diabetes mellitus (HCC) 08/31/2014   Chronic cholecystitis with calculus 09/15/2013   Disorder of synovium, tendon, and bursa 09/14/2013   Feces contents abnormal 09/14/2013   Former smoker 09/14/2013   Morbid obesity with BMI of 40.0-44.9, adult (HCC) 09/06/2013   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) 07/10/2013   Constipation 11/08/2012   Insulin dependent diabetes mellitus (HCC) 06/28/2012   PAD (peripheral artery disease) (HCC) 06/28/2012   Intermittent claudication (HCC) 06/28/2012   Fibromyalgia 06/20/2012   Pain of upper extremity 06/20/2012   Lumbar postlaminectomy syndrome 06/20/2012   Chronic pain 04/20/2012   Migraine 03/31/2012    REFERRING DIAG: Cervicalgia [M54.2]   THERAPY DIAG:  Cervicalgia  Rationale for Evaluation and Treatment Rehabilitation  PERTINENT HISTORY:  PMHX includes Anxiety, CHF, COPD, Fibromyalgia, HTN, Migraines, Type 2 Diabetes, Stage 3 CKD, Diabetic Polyneuropathy, GERD,  Carpal tunnel syndrome, Obesity  PRECAUTIONS: None  SUBJECTIVE:                                                                                                                                                                                      SUBJECTIVE STATEMENT: Patient reports her lower back is bothering her today and that last night her rt thigh was numb for several hours.  Her neck is feeling better today, but shoulders still painful.  PAIN:  Are you having pain? Yes: NPRS scale: 3/10 neck, R shoulder 7/10, L shoulder 7/10 Pain location: posterior and Rt side of neck  Pain description: dull, aching, stiffness, & intermittent spasms in bicep Aggravating factors: shoulder pain  Relieving factors: massage, hot compress, pain medication, IA injections     OBJECTIVE: (objective measures completed at initial evaluation unless otherwise dated)   DIAGNOSTIC FINDINGS:  C-Spine MRI 2021 Multilevel degenerative changes with NF stenosis C4-5  PATIENT SURVEYS:  FOTO for cervical functional limitations to be provided at follow up visit  COGNITION: Overall cognitive status: Within functional limits for tasks assessed   POSTURE: rounded shoulders while seated  PALPATION: Patient has moderate tenderness to palpation along C5-C7 spinous process and minimal tenderness with cervical paraspinals.    CERVICAL ROM:   Active ROM A/PROM (deg) eval  Flexion WFL  Extension WFL  Right lateral flexion 40  Left lateral flexion 30  Right rotation WFL  Left rotation WFL   (Blank rows = not tested)  [facility] Adult PT Treatment:                                                DATE: 09/22/22 Therapeutic Exercise: UBE level 2 fwd/backwards, 3' each Seated UT stretch, 2 x 30 sec each side  Seated levator stretch, 2 x 30 sec each side  Seated double ER with scap retraction RTB 2x10 Seated horizontal abduction RTB 2x10 Seated diagonals RTB x10 BIL Standing shoulder extension RTB 2x10 Standing Rows with Red Theraband, 2 x 10    [facility] Adult PT Treatment:                                                DATE: 09/11/2022  Therapeutic Exercise: UBE level 2 fwd/backwards, 4' each Seated physioball rolling for shoulder AAROM/stretch  Seated UT stretch, 3 x 30 sec each side  Seated levator stretch, 3 x 30 sec each side  Seated Scap Retraction x 20 Seated Rows with Red Theraband, 2  x 10  Supine Cervical Retraction 15x with 3 sec hold   Manual Therapy: STM to bilateral UT, levator, scalene and cervical paraspinals  Manual UT, levator stretch with grade I Rt 1st rib mobs   [facility] Adult PT Treatment:                                                DATE: 09/09/2022  Therapeutic Exercise: UBE level 0-1 fwd/backwards, 4' each Seated physioball rolling for shoulder AAROM/stretch  Seated UT stretch, 3 x 30 sec each side  Seated levator stretch, 3 x 30 sec each side  Supine Cervical Retraction 15x with 3 sec hold   Manual Therapy: STM to bilateral UT, levator, scalene and cervical paraspinals  Manual UT, levator stretch with grade I Rt 1st rib mobs   PATIENT EDUCATION:  Education details: provided and reviewed initial HEP and goal of cervical exercises. Discussed prognosis and POC.  Person educated: Patient Education method: Explanation, Demonstration, and Handouts Education comprehension: verbalized understanding, returned demonstration, verbal cues required, and tactile cues required  HOME EXERCISE PROGRAM: Access Code: [email or web address]  URL: [email or web address] Date: 09/01/2022 Prepared by: [name]  Exercises - Supine Cervical Retraction with Towel  - 2 x daily - 7 x weekly - 2 sets - 10 reps - 3 sec hold - Seated Cervical Retraction  - 2 x daily - 7 x weekly - 2 sets - 10 reps - 3 sec hold - Seated Upper Trapezius Stretch  - 2 x daily - 7 x weekly - 3 reps - 20-30 sec hold  ASSESSMENT:  CLINICAL IMPRESSION: Patient arrives 9 minutes late to appointment, truncating session, reporting continued improvement in neck pain, but continued shoulder and back pain. Session today continued to focus on periscapular strengthening with increased resistance today to good effect. Patient was able to tolerate all prescribed exercises with no adverse effects. Patient continues to benefit from skilled PT services and should be progressed as able to improve functional  independence.    OBJECTIVE IMPAIRMENTS: decreased activity tolerance, decreased mobility, decreased ROM, decreased strength, impaired UE functional use, postural dysfunction, and pain.   ACTIVITY LIMITATIONS: carrying, lifting, and prolonged reading  PARTICIPATION LIMITATIONS: meal prep, cleaning, and normal recreational activity   PERSONAL FACTORS: Age, Past/current experiences, Time since onset of injury/illness/exacerbation, and 3+ comorbidities: PMHX includes Anxiety, CHF, COPD, Fibromyalgia, HTN, Migraines, Type 2 Diabetes, Stage 3 CKD, Diabetic Polyneuropathy, GERD,  Carpal tunnel syndrome, Obesity are also affecting patient's functional outcome.   REHAB POTENTIAL: Fair  CLINICAL DECISION MAKING: Evolving/moderate complexity  EVALUATION COMPLEXITY: Moderate   GOALS: Goals reviewed with patient? Yes  SHORT TERM GOALS: Target date: 09/22/2022   Patient will be independent with initial HEP for improved cervical mobility and pain modulation.  Baseline:  Goal status: MET Pt reports adherence 09/22/22  2.  Patient will demonstrate improved Lt lateral flexion AROM by at least 5 degrees.  Baseline: 30 deg Goal status: INITIAL    LONG TERM GOALS: Target date: 10/17/2022    Patient will report ability to look down for reading and word search puzzle for up to 20 minutes with minimal-to-no pain.  Baseline: unable  Goal status: INITIAL  2.  Patient will demonstrated cervical lateral flexion AROM at least 40 degrees bilaterally with minimal-to-no pain at end range.  Goal status: INITIAL  3.  Patient will report ability to perform at least 60 minutes of normal household chores and ADLs without exacerbation of symptoms.  Goal status: INITIAL  4.  Patient will be independent with HEP for ongoing symptom management and further improvement of activity tolerance.  Goal status: INITIAL   PLAN:  PT FREQUENCY: 2x/week  PT DURATION: 6 weeks  PLANNED INTERVENTIONS: Therapeutic  exercises, Therapeutic activity, Neuromuscular re-education, Patient/Family education, Self Care, Joint mobilization, Dry Needling, Electrical stimulation, Cryotherapy, Moist heat, Taping, Traction, Manual therapy, and Re-evaluation  PLAN FOR NEXT SESSION: continue with cervical mobility and isometric strengthening activities. Continue pain neuroscience education, conservative pain management, and address activity tolerance as tolerated. Start NuStep next week    Berta Minor, PTA 09/22/2022, 12:09 PM

## 2022-09-25 ENCOUNTER — Ambulatory Visit: Payer: 59

## 2022-09-25 DIAGNOSIS — M542 Cervicalgia: Secondary | ICD-10-CM | POA: Diagnosis not present

## 2022-09-25 NOTE — Therapy (Signed)
OUTPATIENT PHYSICAL THERAPY TREATMENT NOTE   Patient Name: Christina Solis MRN: [id number] DOB:1951/05/06, 72 y.o., female Today's Date: 09/25/2022  PCP: Iona Hansen, NP  REFERRING PROVIDER: Janeece Riggers, FNP   END OF SESSION:       Past Medical History:  Diagnosis Date   Anemia    sickle cell trait   Anxiety    Arthritis    "knees" (10/12/2013)   CHF (congestive heart failure) (HCC)    Chronic back pain    Chronic bronchitis (HCC)    "got it q yr when I lived in [place]"   Complication of anesthesia    hard to put under   COPD (chronic obstructive pulmonary disease) (HCC)    Dysrhythmia    Family history of anesthesia complication    sister hard to wake up   Family history of anesthesia complication    niece have itching   Fibromyalgia    GERD (gastroesophageal reflux disease)    Hyperlipidemia    Hypertension    Irregular heart beat    Migraine    "used to get them alot; not regular anymore" (10/12/2013)   PAD (peripheral artery disease) (HCC)    Pituitary tumor    followed at [facility] Endocrinology; on cabergoline 09/2013   Pneumonia    "twice"   Type II diabetes mellitus (HCC)    Past Surgical History:  Procedure Laterality Date   ABDOMINAL AORTAGRAM N/A 06/28/2012   Procedure: ABDOMINAL Ronny Flurry;  Surgeon: Pamella Pert, MD;  Location: [facility] CATH LAB;  Service: Cardiovascular;  Laterality: N/A;   APPENDECTOMY     BACK SURGERY     x 2, lower back   BILATERAL CARPAL TUNNEL RELEASE     CARDIAC CATHETERIZATION     CATARACT EXTRACTION Left 2023   CATARACT EXTRACTION Right 2023   CESAREAN SECTION  1974; 1976; 1978   CHOLECYSTECTOMY N/A 10/12/2013   Procedure: LAPAROSCOPIC CHOLECYSTECTOMY WITH INTRAOPERATIVE CHOLANGIOGRAM;  Surgeon: Wilmon Arms. Corliss Skains, MD;  Location: [facility] OR;  Service: General;  Laterality: N/A;   COLONOSCOPY     FEMORAL ARTERY STENT Left 06/28/2012   SFA   LAPAROSCOPIC CHOLECYSTECTOMY  10/12/2013   LEFT HEART CATHETERIZATION  WITH CORONARY ANGIOGRAM N/A 06/28/2012   Procedure: LEFT HEART CATHETERIZATION WITH CORONARY ANGIOGRAM;  Surgeon: Pamella Pert, MD;  Location: [facility] CATH LAB;  Service: Cardiovascular;  Laterality: N/A;   LOWER EXTREMITY ANGIOGRAM N/A 06/28/2012   Procedure: LOWER EXTREMITY ANGIOGRAM;  Surgeon: Pamella Pert, MD;  Location: [facility] CATH LAB;  Service: Cardiovascular;  Laterality: N/A;   PERIPHERAL VASCULAR CATHETERIZATION N/A 09/10/2015   Procedure: Abdominal Aortogram w/Lower Extremity;  Surgeon: Yates Decamp, MD;  Location: [facility] INVASIVE CV LAB;  Service: Cardiovascular;  Laterality: N/A;   POSTERIOR LUMBAR FUSION     REPLACEMENT TOTAL KNEE Left    TONSILLECTOMY     TUBAL LIGATION  1978   Patient Active Problem List   Diagnosis Date Noted   Preop cardiovascular exam 12/12/2021   Type 2 diabetes mellitus with stage 3a chronic kidney disease, with long-term current use of insulin (HCC) 06/24/2020   Mixed hyperlipidemia 06/24/2020   Asymptomatic bilateral carotid artery stenosis 02/21/2020   Calcification of right carotid artery 12/07/2019   COPD, group B, by GOLD 2017 classification (HCC) 07/06/2019   Multiple lung nodules 07/06/2019   Diabetes mellitus (HCC) 05/03/2019   Type 2 diabetes mellitus with diabetic polyneuropathy, with long-term current use of insulin (HCC) 05/03/2019   Screening for viral disease  03/22/2019   Encounter for current long term use of antiplatelet drug 03/22/2019   Esophageal dysphagia 03/22/2019   Gastroesophageal reflux disease 03/22/2019   Hx of colonic polyps 03/22/2019   Atrial tachycardia    Left bundle branch block    Chronic diastolic CHF (congestive heart failure) (HCC)    Uncontrolled type 2 diabetes mellitus with complication    CKD (chronic kidney disease), stage III (HCC) 02/02/2016   Anemia, unspecified 04/08/2015   Hemorrhoids 04/08/2015   Osteoarthritis of right knee 04/05/2015   HTN (hypertension) 04/05/2015   Neuropathy in diabetes (HCC)  04/05/2015   Hyperlipidemia 04/05/2015   Pain and swelling of left lower leg 04/05/2015   Carpal tunnel syndrome of left wrist 12/18/2014   Pain in left knee 12/18/2014   Vitamin D deficiency 11/29/2014   Hyperlipidemia associated with type 2 diabetes mellitus (HCC) 08/31/2014   Chronic cholecystitis with calculus 09/15/2013   Disorder of synovium, tendon, and bursa 09/14/2013   Feces contents abnormal 09/14/2013   Former smoker 09/14/2013   Morbid obesity with BMI of 40.0-44.9, adult (HCC) 09/06/2013   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) 07/10/2013   Constipation 11/08/2012   Insulin dependent diabetes mellitus (HCC) 06/28/2012   PAD (peripheral artery disease) (HCC) 06/28/2012   Intermittent claudication (HCC) 06/28/2012   Fibromyalgia 06/20/2012   Pain of upper extremity 06/20/2012   Lumbar postlaminectomy syndrome 06/20/2012   Chronic pain 04/20/2012   Migraine 03/31/2012    REFERRING DIAG: Cervicalgia [M54.2]   THERAPY DIAG:  Cervicalgia  Rationale for Evaluation and Treatment Rehabilitation  PERTINENT HISTORY:  PMHX includes Anxiety, CHF, COPD, Fibromyalgia, HTN, Migraines, Type 2 Diabetes, Stage 3 CKD, Diabetic Polyneuropathy, GERD,  Carpal tunnel syndrome, Obesity  PRECAUTIONS: None  SUBJECTIVE:                                                                                                                                                                                      SUBJECTIVE STATEMENT: Patient states that she is feeling about the same. "I have good days and bad days, but I felt pretty good after my last session."   PAIN:  Are you having pain? Yes: NPRS scale: 2/10 neck, R shoulder 4/10, L shoulder 4/10 "I took an Advil"  Pain location: posterior and Rt side of neck  Pain description: dull, aching, stiffness, & intermittent spasms in bicep Aggravating factors: shoulder pain  Relieving factors: massage, hot compress, pain  medication, IA injections     OBJECTIVE: (objective measures completed at initial evaluation unless otherwise dated)   DIAGNOSTIC FINDINGS:  C-Spine MRI 2021 Multilevel degenerative changes with NF stenosis C4-5  PATIENT SURVEYS:  FOTO for cervical functional limitations to be provided at follow up visit  COGNITION: Overall cognitive status: Within functional limits for tasks assessed   POSTURE: rounded shoulders while seated  PALPATION: Patient has moderate tenderness to palpation along C5-C7 spinous process and minimal tenderness with cervical paraspinals.    CERVICAL ROM:   Active ROM A/PROM (deg) eval  Flexion WFL  Extension WFL  Right lateral flexion 40  Left lateral flexion 30  Right rotation WFL  Left rotation WFL   (Blank rows = not tested)  [facility] Adult PT Treatment:                                                DATE: 09/25/2022  Therapeutic Exercise: UBE level 3 fwd/backwards, 3' each Seated UT stretch, 2 x 30 sec each side  Seated levator stretch, 2 x 30 sec each side  Seated double ER with scap retraction GTB 2x10 Seated horizontal abduction GTB 2x10 Seated diagonals GTB x10 BIL Standing shoulder extension GTB 2x10 Standing Rows with GTB, 2 x 10  Shoulder roll forward/back x 15 each   Manual Therapy: STM to bilateral UT, levator, scalene and cervical paraspinals  Manual UT, levator stretch with grade I Rt 1st rib mobs  [facility] Adult PT Treatment:                                                DATE: 09/22/22 Therapeutic Exercise: UBE level 2 fwd/backwards, 3' each Seated UT stretch, 2 x 30 sec each side  Seated levator stretch, 2 x 30 sec each side  Seated double ER with scap retraction RTB 2x10 Seated horizontal abduction RTB 2x10 Seated diagonals RTB x10 BIL Standing shoulder extension RTB 2x10 Standing Rows with Red Theraband, 2 x 10    [facility] Adult PT Treatment:                                                DATE: 09/11/2022  Therapeutic  Exercise: UBE level 2 fwd/backwards, 4' each Seated physioball rolling for shoulder AAROM/stretch  Seated UT stretch, 3 x 30 sec each side  Seated levator stretch, 3 x 30 sec each side  Seated Scap Retraction x 20 Seated Rows with Red Theraband, 2 x 10  Supine Cervical Retraction 15x with 3 sec hold   Manual Therapy: STM to bilateral UT, levator, scalene and cervical paraspinals  Manual UT, levator stretch with grade I Rt 1st rib mobs   PATIENT EDUCATION:  Education details: provided and reviewed initial HEP and goal of cervical exercises. Discussed prognosis and POC.  Person educated: Patient Education method: Explanation, Demonstration, and Handouts Education comprehension: verbalized understanding, returned demonstration, verbal cues required, and tactile cues required  HOME EXERCISE PROGRAM: Access Code: [email or web address] URL: [email or web address] Date: 09/01/2022 Prepared by: [name]  Exercises - Supine Cervical Retraction with Towel  - 2 x daily - 7 x weekly - 2 sets - 10 reps - 3 sec hold - Seated Cervical Retraction  - 2 x daily - 7 x weekly - 2 sets - 10 reps - 3 sec hold - Seated  Upper Trapezius Stretch  - 2 x daily - 7 x weekly - 3 reps - 20-30 sec hold  ASSESSMENT:  CLINICAL IMPRESSION: Patient was able to tolerate increased resistance with all exercises today without exacerbation of symptoms. She did have some muscle fatigue at end of session that was alleviated with mobility activities and manual therapy. We will continue to progress towards functional activities as tolerated.    OBJECTIVE IMPAIRMENTS: decreased activity tolerance, decreased mobility, decreased ROM, decreased strength, impaired UE functional use, postural dysfunction, and pain.   ACTIVITY LIMITATIONS: carrying, lifting, and prolonged reading  PARTICIPATION LIMITATIONS: meal prep, cleaning, and normal recreational activity   PERSONAL FACTORS: Age, Past/current experiences, Time  since onset of injury/illness/exacerbation, and 3+ comorbidities: PMHX includes Anxiety, CHF, COPD, Fibromyalgia, HTN, Migraines, Type 2 Diabetes, Stage 3 CKD, Diabetic Polyneuropathy, GERD,  Carpal tunnel syndrome, Obesity are also affecting patient's functional outcome.   REHAB POTENTIAL: Fair  CLINICAL DECISION MAKING: Evolving/moderate complexity  EVALUATION COMPLEXITY: Moderate   GOALS: Goals reviewed with patient? Yes  SHORT TERM GOALS: Target date: 09/22/2022   Patient will be independent with initial HEP for improved cervical mobility and pain modulation.  Baseline:  Goal status: MET Pt reports adherence 09/22/22  2.  Patient will demonstrate improved Lt lateral flexion AROM by at least 5 degrees.  Baseline: 30 deg Goal status: INITIAL    LONG TERM GOALS: Target date: 10/17/2022    Patient will report ability to look down for reading and word search puzzle for up to 20 minutes with minimal-to-no pain.  Baseline: unable  Goal status: INITIAL  2.  Patient will demonstrated cervical lateral flexion AROM at least 40 degrees bilaterally with minimal-to-no pain at end range.  Goal status: INITIAL  3.  Patient will report ability to perform at least 60 minutes of normal household chores and ADLs without exacerbation of symptoms.  Goal status: INITIAL  4.  Patient will be independent with HEP for ongoing symptom management and further improvement of activity tolerance.  Goal status: INITIAL   PLAN:  PT FREQUENCY: 2x/week  PT DURATION: 6 weeks  PLANNED INTERVENTIONS: Therapeutic exercises, Therapeutic activity, Neuromuscular re-education, Patient/Family education, Self Care, Joint mobilization, Dry Needling, Electrical stimulation, Cryotherapy, Moist heat, Taping, Traction, Manual therapy, and Re-evaluation  PLAN FOR NEXT SESSION: continue with cervical mobility and isometric strengthening activities. Continue pain neuroscience education, conservative pain management, and  address activity tolerance as tolerated.     [name], PT, DPT 09/25/2022, 8:18 AM

## 2022-10-07 ENCOUNTER — Ambulatory Visit: Payer: 59

## 2022-10-07 DIAGNOSIS — M542 Cervicalgia: Secondary | ICD-10-CM

## 2022-10-07 NOTE — Therapy (Addendum)
PHYSICAL THERAPY DISCHARGE SUMMARY  Visits from Start of Care: 6  Patient goals were not met. Patient is being discharged due to not returning since the last visit.   [name], PT, DPT  11/25/2022    OUTPATIENT PHYSICAL THERAPY TREATMENT NOTE   Patient Name: Christina Solis MRN: [id number] DOB:Sep 25, 1950, 72 y.o., female Today's Date: 10/07/2022  PCP: Iona Hansen, NP  REFERRING PROVIDER: Janeece Riggers, FNP   END OF SESSION:   PT End of Session - 10/07/22 1043     Visit Number 6    Number of Visits 13    PT Start Time 1045    PT Stop Time 1123    PT Time Calculation (min) 38 min    Activity Tolerance Patient tolerated treatment well    Behavior During Therapy WFL for tasks assessed/performed              Past Medical History:  Diagnosis Date   Anemia    sickle cell trait   Anxiety    Arthritis    "knees" (10/12/2013)   CHF (congestive heart failure) (HCC)    Chronic back pain    Chronic bronchitis (HCC)    "got it q yr when I lived in [place]"   Complication of anesthesia    hard to put under   COPD (chronic obstructive pulmonary disease) (HCC)    Dysrhythmia    Family history of anesthesia complication    sister hard to wake up   Family history of anesthesia complication    niece have itching   Fibromyalgia    GERD (gastroesophageal reflux disease)    Hyperlipidemia    Hypertension    Irregular heart beat    Migraine    "used to get them alot; not regular anymore" (10/12/2013)   PAD (peripheral artery disease) [facility])    Pituitary tumor    followed at [facility] Endocrinology; on cabergoline 09/2013   Pneumonia    "twice"   Type II diabetes mellitus (HCC)    Past Surgical History:  Procedure Laterality Date   ABDOMINAL AORTAGRAM N/A 06/28/2012   Procedure: ABDOMINAL Ronny Flurry;  Surgeon: Pamella Pert, MD;  Location: [facility] CATH LAB;  Service: Cardiovascular;  Laterality: N/A;   APPENDECTOMY     BACK SURGERY     x 2, lower back    BILATERAL CARPAL TUNNEL RELEASE     CARDIAC CATHETERIZATION     CATARACT EXTRACTION Left 2023   CATARACT EXTRACTION Right 2023   CESAREAN SECTION  1974; 1976; 1978   CHOLECYSTECTOMY N/A 10/12/2013   Procedure: LAPAROSCOPIC CHOLECYSTECTOMY WITH INTRAOPERATIVE CHOLANGIOGRAM;  Surgeon: Wilmon Arms. Corliss Skains, MD;  Location: [facility] OR;  Service: General;  Laterality: N/A;   COLONOSCOPY     FEMORAL ARTERY STENT Left 06/28/2012   SFA   LAPAROSCOPIC CHOLECYSTECTOMY  10/12/2013   LEFT HEART CATHETERIZATION WITH CORONARY ANGIOGRAM N/A 06/28/2012   Procedure: LEFT HEART CATHETERIZATION WITH CORONARY ANGIOGRAM;  Surgeon: Pamella Pert, MD;  Location: [facility] CATH LAB;  Service: Cardiovascular;  Laterality: N/A;   LOWER EXTREMITY ANGIOGRAM N/A 06/28/2012   Procedure: LOWER EXTREMITY ANGIOGRAM;  Surgeon: Pamella Pert, MD;  Location: [facility] CATH LAB;  Service: Cardiovascular;  Laterality: N/A;   PERIPHERAL VASCULAR CATHETERIZATION N/A 09/10/2015   Procedure: Abdominal Aortogram w/Lower Extremity;  Surgeon: Yates Decamp, MD;  Location: [facility] INVASIVE CV LAB;  Service: Cardiovascular;  Laterality: N/A;   POSTERIOR LUMBAR FUSION     REPLACEMENT TOTAL KNEE Left  TONSILLECTOMY     TUBAL LIGATION  1978   Patient Active Problem List   Diagnosis Date Noted   Preop cardiovascular exam 12/12/2021   Type 2 diabetes mellitus with stage 3a chronic kidney disease, with long-term current use of insulin (HCC) 06/24/2020   Mixed hyperlipidemia 06/24/2020   Asymptomatic bilateral carotid artery stenosis 02/21/2020   Calcification of right carotid artery 12/07/2019   COPD, group B, by GOLD 2017 classification (HCC) 07/06/2019   Multiple lung nodules 07/06/2019   Diabetes mellitus (HCC) 05/03/2019   Type 2 diabetes mellitus with diabetic polyneuropathy, with long-term current use of insulin (HCC) 05/03/2019   Screening for viral disease 03/22/2019   Encounter for current long term use of antiplatelet drug 03/22/2019   Esophageal  dysphagia 03/22/2019   Gastroesophageal reflux disease 03/22/2019   Hx of colonic polyps 03/22/2019   Atrial tachycardia    Left bundle branch block    Chronic diastolic CHF (congestive heart failure) (HCC)    Uncontrolled type 2 diabetes mellitus with complication    CKD (chronic kidney disease), stage III (HCC) 02/02/2016   Anemia, unspecified 04/08/2015   Hemorrhoids 04/08/2015   Osteoarthritis of right knee 04/05/2015   HTN (hypertension) 04/05/2015   Neuropathy in diabetes (HCC) 04/05/2015   Hyperlipidemia 04/05/2015   Pain and swelling of left lower leg 04/05/2015   Carpal tunnel syndrome of left wrist 12/18/2014   Pain in left knee 12/18/2014   Vitamin D deficiency 11/29/2014   Hyperlipidemia associated with type 2 diabetes mellitus (HCC) 08/31/2014   Chronic cholecystitis with calculus 09/15/2013   Disorder of synovium, tendon, and bursa 09/14/2013   Feces contents abnormal 09/14/2013   Former smoker 09/14/2013   Morbid obesity with BMI of 40.0-44.9, adult (HCC) 09/06/2013   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) 07/10/2013   Constipation 11/08/2012   Insulin dependent diabetes mellitus (HCC) 06/28/2012   PAD (peripheral artery disease) (HCC) 06/28/2012   Intermittent claudication (HCC) 06/28/2012   Fibromyalgia 06/20/2012   Pain of upper extremity 06/20/2012   Lumbar postlaminectomy syndrome 06/20/2012   Chronic pain 04/20/2012   Migraine 03/31/2012    REFERRING DIAG: Cervicalgia [M54.2]   THERAPY DIAG:  Cervicalgia  Rationale for Evaluation and Treatment Rehabilitation  PERTINENT HISTORY:  PMHX includes Anxiety, CHF, COPD, Fibromyalgia, HTN, Migraines, Type 2 Diabetes, Stage 3 CKD, Diabetic Polyneuropathy, GERD,  Carpal tunnel syndrome, Obesity  PRECAUTIONS: None  SUBJECTIVE:                                                                                                                                                                                       SUBJECTIVE STATEMENT: Patient  reports increased pain in her neck and Rt shoulder that started on Sunday.   PAIN:  Are you having pain? Yes: NPRS scale: 6/10 neck, R shoulder 6/10, L shoulder 4/10 "I took an Advil"  Pain location: posterior and Rt side of neck  Pain description: dull, aching, stiffness, & intermittent spasms in bicep Aggravating factors: shoulder pain  Relieving factors: massage, hot compress, pain medication, IA injections     OBJECTIVE: (objective measures completed at initial evaluation unless otherwise dated)   DIAGNOSTIC FINDINGS:  C-Spine MRI 2021 Multilevel degenerative changes with NF stenosis C4-5  PATIENT SURVEYS:  FOTO for cervical functional limitations to be provided at follow up visit  COGNITION: Overall cognitive status: Within functional limits for tasks assessed   POSTURE: rounded shoulders while seated  PALPATION: Patient has moderate tenderness to palpation along C5-C7 spinous process and minimal tenderness with cervical paraspinals.    CERVICAL ROM:   Active ROM A/PROM (deg) eval  Flexion WFL  Extension WFL  Right lateral flexion 40  Left lateral flexion 30  Right rotation WFL  Left rotation WFL   (Blank rows = not tested)  [facility] Adult PT Treatment:                                                DATE: 10/07/22 Therapeutic Exercise: UBE level 3 fwd/backwards, 3' each Seated UT stretch, 2 x 30 sec each side  Seated levator stretch, 2 x 30 sec each side  Seated scaption to shoulder height 1# 2x10  Seated double ER with scap retraction GTB 2x10 Seated horizontal abduction GTB 2x10 Seated diagonals GTB 2x10 BIL Standing shoulder extension GTB 2x10 Standing Rows with GTB, 2 x 10  Shoulder roll forward/back x 15 each  Cervical AROM flex/ext x10  [facility] Adult PT Treatment:                                                DATE: 09/25/2022  Therapeutic Exercise: UBE level 3 fwd/backwards, 3' each Seated UT stretch,  2 x 30 sec each side  Seated levator stretch, 2 x 30 sec each side  Seated double ER with scap retraction GTB 2x10 Seated horizontal abduction GTB 2x10 Seated diagonals GTB x10 BIL Standing shoulder extension GTB 2x10 Standing Rows with GTB, 2 x 10  Shoulder roll forward/back x 15 each   Manual Therapy: STM to bilateral UT, levator, scalene and cervical paraspinals  Manual UT, levator stretch with grade I Rt 1st rib mobs  [facility] Adult PT Treatment:                                                DATE: 09/22/22 Therapeutic Exercise: UBE level 2 fwd/backwards, 3' each Seated UT stretch, 2 x 30 sec each side  Seated levator stretch, 2 x 30 sec each side  Seated double ER with scap retraction RTB 2x10 Seated horizontal abduction RTB 2x10 Seated diagonals RTB x10 BIL Standing shoulder extension RTB 2x10 Standing Rows with Red Theraband, 2 x 10     PATIENT EDUCATION:  Education details: provided and reviewed initial HEP and  goal of cervical exercises. Discussed prognosis and POC.  Person educated: Patient Education method: Explanation, Demonstration, and Handouts Education comprehension: verbalized understanding, returned demonstration, verbal cues required, and tactile cues required  HOME EXERCISE PROGRAM: Access Code: [email or web address] URL: [email or web address] Date: 09/01/2022 Prepared by: [name]  Exercises - Supine Cervical Retraction with Towel  - 2 x daily - 7 x weekly - 2 sets - 10 reps - 3 sec hold - Seated Cervical Retraction  - 2 x daily - 7 x weekly - 2 sets - 10 reps - 3 sec hold - Seated Upper Trapezius Stretch  - 2 x daily - 7 x weekly - 3 reps - 20-30 sec hold  ASSESSMENT:  CLINICAL IMPRESSION: Patient presents to PT reporting increased pain in neck and particularly in the Rt shoulder that she states started on Sunday without any particular exacerbation. Session today continued to focus on RTC and periscapular strengthening. Patient was able to tolerate  all prescribed exercises with no adverse effects. Patient continues to benefit from skilled PT services and should be progressed as able to improve functional independence.   OBJECTIVE IMPAIRMENTS: decreased activity tolerance, decreased mobility, decreased ROM, decreased strength, impaired UE functional use, postural dysfunction, and pain.   ACTIVITY LIMITATIONS: carrying, lifting, and prolonged reading  PARTICIPATION LIMITATIONS: meal prep, cleaning, and normal recreational activity   PERSONAL FACTORS: Age, Past/current experiences, Time since onset of injury/illness/exacerbation, and 3+ comorbidities: PMHX includes Anxiety, CHF, COPD, Fibromyalgia, HTN, Migraines, Type 2 Diabetes, Stage 3 CKD, Diabetic Polyneuropathy, GERD,  Carpal tunnel syndrome, Obesity are also affecting patient's functional outcome.   REHAB POTENTIAL: Fair  CLINICAL DECISION MAKING: Evolving/moderate complexity  EVALUATION COMPLEXITY: Moderate   GOALS: Goals reviewed with patient? Yes  SHORT TERM GOALS: Target date: 09/22/2022   Patient will be independent with initial HEP for improved cervical mobility and pain modulation.  Baseline:  Goal status: MET Pt reports adherence 09/22/22  2.  Patient will demonstrate improved Lt lateral flexion AROM by at least 5 degrees.  Baseline: 30 deg Goal status: INITIAL    LONG TERM GOALS: Target date: 10/17/2022    Patient will report ability to look down for reading and word search puzzle for up to 20 minutes with minimal-to-no pain.  Baseline: unable  Goal status: INITIAL  2.  Patient will demonstrated cervical lateral flexion AROM at least 40 degrees bilaterally with minimal-to-no pain at end range.  Goal status: INITIAL  3.  Patient will report ability to perform at least 60 minutes of normal household chores and ADLs without exacerbation of symptoms.  Goal status: INITIAL  4.  Patient will be independent with HEP for ongoing symptom management and further  improvement of activity tolerance.  Goal status: INITIAL   PLAN:  PT FREQUENCY: 2x/week  PT DURATION: 6 weeks  PLANNED INTERVENTIONS: Therapeutic exercises, Therapeutic activity, Neuromuscular re-education, Patient/Family education, Self Care, Joint mobilization, Dry Needling, Electrical stimulation, Cryotherapy, Moist heat, Taping, Traction, Manual therapy, and Re-evaluation  PLAN FOR NEXT SESSION: continue with cervical mobility and isometric strengthening activities. Continue pain neuroscience education, conservative pain management, and address activity tolerance as tolerated.     Berta Minor, PTA 10/07/2022, 11:29 AM

## 2022-10-14 ENCOUNTER — Ambulatory Visit: Payer: 59

## 2022-10-16 ENCOUNTER — Ambulatory Visit: Payer: 59 | Admitting: Physical Therapy

## 2022-10-19 ENCOUNTER — Other Ambulatory Visit: Payer: Self-pay | Admitting: Nurse Practitioner

## 2022-10-19 DIAGNOSIS — E2839 Other primary ovarian failure: Secondary | ICD-10-CM

## 2022-10-19 DIAGNOSIS — Z1231 Encounter for screening mammogram for malignant neoplasm of breast: Secondary | ICD-10-CM

## 2022-10-20 ENCOUNTER — Ambulatory Visit: Payer: 59

## 2022-10-22 ENCOUNTER — Ambulatory Visit: Payer: 59 | Attending: Nurse Practitioner

## 2022-10-22 ENCOUNTER — Telehealth: Payer: Self-pay

## 2022-10-22 NOTE — Telephone Encounter (Signed)
LVM for patient at 11:20am regarding missed appointment at 10:45 today and offered later appointment times today. - MJ

## 2022-11-25 ENCOUNTER — Ambulatory Visit (INDEPENDENT_AMBULATORY_CARE_PROVIDER_SITE_OTHER): Payer: 59 | Admitting: Internal Medicine

## 2022-11-25 ENCOUNTER — Encounter: Payer: Self-pay | Admitting: Internal Medicine

## 2022-11-25 VITALS — BP 122/70 | HR 80 | Ht 62.0 in | Wt 211.0 lb

## 2022-11-25 DIAGNOSIS — E1142 Type 2 diabetes mellitus with diabetic polyneuropathy: Secondary | ICD-10-CM

## 2022-11-25 DIAGNOSIS — Z794 Long term (current) use of insulin: Secondary | ICD-10-CM

## 2022-11-25 DIAGNOSIS — E781 Pure hyperglyceridemia: Secondary | ICD-10-CM | POA: Diagnosis not present

## 2022-11-25 LAB — POCT GLYCOSYLATED HEMOGLOBIN (HGB A1C): Hemoglobin A1C: 6.3 % — AB (ref 4.0–5.6)

## 2022-11-25 MED ORDER — ROSUVASTATIN CALCIUM 40 MG PO TABS: 40.0000 mg | ORAL_TABLET | Freq: Every day | ORAL | 3 refills | Status: DC

## 2022-11-25 MED ORDER — HUMALOG MIX 75/25 KWIKPEN (75-25) 100 UNIT/ML ~~LOC~~ SUPN: 16.0000 [IU] | PEN_INJECTOR | Freq: Two times a day (BID) | SUBCUTANEOUS | 3 refills | Status: DC

## 2022-11-25 MED ORDER — DAPAGLIFLOZIN PROPANEDIOL 10 MG PO TABS: 10.0000 mg | ORAL_TABLET | Freq: Every day | ORAL | 3 refills | Status: DC

## 2022-11-25 MED ORDER — ICOSAPENT ETHYL 1 G PO CAPS: 2.0000 g | ORAL_CAPSULE | Freq: Two times a day (BID) | ORAL | 2 refills | Status: DC

## 2022-11-25 MED ORDER — TIRZEPATIDE 7.5 MG/0.5ML ~~LOC~~ SOAJ: 7.5000 mg | SUBCUTANEOUS | 3 refills | Status: DC

## 2022-11-25 NOTE — Progress Notes (Signed)
Name: Christina Solis  Age/ Sex: 72 y.o., female   MRN/ DOB: [id number], 08-27-1950     PCP: Iona Hansen, NP   Reason for Endocrinology Evaluation: Type 2 Diabetes Mellitus  Initial Endocrine Consultative Visit:  05/03/2019    PATIENT IDENTIFIER: Christina Solis is a 72 y.o. female with a past medical history of HTN, COPD, and T2DM. The patient has followed with Endocrinology clinic since 05/03/2019 for consultative assistance with management of her diabetes.  DIABETIC HISTORY:  Christina Solis was diagnosed with T2 DM in 2008 .She was on metformin that was stopped due to CKD, has been on insulin since 2009. Her hemoglobin A1c has ranged from 6.9% in 2015, peaking at 7.4% in 2020.  On her initial visit to our clinic she had an A1c of 7.4%, she was on Humalog mix, which was continued and adjusted.   Started Ozempic 12/2021  Switch Ozempic to [facility] 05/2022 due to insurance coverage  PITUITARY TUMOR :  She was first diagnosed with pituitary adenoma in 2006 . She was put on medication for ~ 8 month but when her level normalized ,the medicine was stopped. She had galactorrhea up to 2018.  Repeat prolactin in 2022 was normal with appropriately elevated FSH and normal IGF  Prolactin was elevated in 2023   She is a retired Engineer, civil (consulting) Lives with daughter SUBJECTIVE:   During the last visit (05/22/2022): A1c 6.3%      Today (11/25/2022): Christina Solis is here for follow-up on diabetes management. She checks her blood sugars occasionally.  The patient has not had hypoglycemic episodes since the last clinic visit.   She has been noted with weight loss   Denies nausea, vomiting  Denies  diarrhea , but has occasional constipation that she attributes to pain meds  Denies headaches  No galactorrhea  She had cataract surgeries in the fall 2023   HOME DIABETES REGIMEN:  Humalog mix 20 units BID Farxiga 10 mg daily  Mounjaro 5 mg weekly Rosuvastatin 20 mg daily   Statin: Yes ACE-I/ARB:  Yes    METER DOWNLOAD SUMMARY: Date range evaluated: 6/11-7/02/2023 Average Number Tests = 18 Overall Mean FS Glucose = 130   BG Ranges Low = 73 High = 187   Hypoglycemic Events/30 Days: BG < 50 = 0 Episodes of symptomatic severe hypoglycemia = 0    DIABETIC COMPLICATIONS: Microvascular complications:  Neuropathy, CKD III, right eye cataract  Denies: retinopathy Last eye exam: 02/2022   Macrovascular complications:  CHF, PVD Denies: CAD, CVA    HISTORY:  Past Medical History:  Past Medical History:  Diagnosis Date   Anemia    sickle cell trait   Anxiety    Arthritis    "knees" (10/12/2013)   CHF (congestive heart failure) (HCC)    Chronic back pain    Chronic bronchitis (HCC)    "got it q yr when I lived in [place]"   Complication of anesthesia    hard to put under   COPD (chronic obstructive pulmonary disease) (HCC)    Dysrhythmia    Family history of anesthesia complication    sister hard to wake up   Family history of anesthesia complication    niece have itching   Fibromyalgia    GERD (gastroesophageal reflux disease)    Hyperlipidemia    Hypertension    Irregular heart beat    Migraine    "used to get them alot; not regular anymore" (10/12/2013)   PAD (peripheral artery disease) (  [facility])    Pituitary tumor    followed at [facility]; on cabergoline 09/2013   Pneumonia    "twice"   Type II diabetes mellitus (HCC)    Past Surgical History:  Past Surgical History:  Procedure Laterality Date   ABDOMINAL AORTAGRAM N/A 06/28/2012   Procedure: ABDOMINAL Ronny Flurry;  Surgeon: Pamella Pert, MD;  Location: [facility] CATH LAB;  Service: Cardiovascular;  Laterality: N/A;   APPENDECTOMY     BACK SURGERY     x 2, lower back   BILATERAL CARPAL TUNNEL RELEASE     CARDIAC CATHETERIZATION     CATARACT EXTRACTION Left 2023   CATARACT EXTRACTION Right 2023   CESAREAN SECTION  1974; 1976; 1978   CHOLECYSTECTOMY N/A 10/12/2013   Procedure: LAPAROSCOPIC  CHOLECYSTECTOMY WITH INTRAOPERATIVE CHOLANGIOGRAM;  Surgeon: Wilmon Arms. Corliss Skains, MD;  Location: [facility] OR;  Service: General;  Laterality: N/A;   COLONOSCOPY     FEMORAL ARTERY STENT Left 06/28/2012   SFA   LAPAROSCOPIC CHOLECYSTECTOMY  10/12/2013   LEFT HEART CATHETERIZATION WITH CORONARY ANGIOGRAM N/A 06/28/2012   Procedure: LEFT HEART CATHETERIZATION WITH CORONARY ANGIOGRAM;  Surgeon: Pamella Pert, MD;  Location: [facility] CATH LAB;  Service: Cardiovascular;  Laterality: N/A;   LOWER EXTREMITY ANGIOGRAM N/A 06/28/2012   Procedure: LOWER EXTREMITY ANGIOGRAM;  Surgeon: Pamella Pert, MD;  Location: [facility] CATH LAB;  Service: Cardiovascular;  Laterality: N/A;   PERIPHERAL VASCULAR CATHETERIZATION N/A 09/10/2015   Procedure: Abdominal Aortogram w/Lower Extremity;  Surgeon: Yates Decamp, MD;  Location: [facility] INVASIVE CV LAB;  Service: Cardiovascular;  Laterality: N/A;   POSTERIOR LUMBAR FUSION     REPLACEMENT TOTAL KNEE Left    TONSILLECTOMY     TUBAL LIGATION  1978   Social History:  reports that she quit smoking about 7 years ago. Her smoking use included cigarettes. She started smoking about 22 years ago. She has a 23.00 pack-year smoking history. She has never used smokeless tobacco. She reports that she does not drink alcohol and does not use drugs. Family History:  Family History  Problem Relation Age of Onset   Diabetes Mother    Kidney disease Mother    Cancer Father    Pancreatic cancer Father    Kidney disease Brother    Cancer Brother    Colon cancer Neg Hx    Inflammatory bowel disease Neg Hx    Esophageal cancer Neg Hx    Liver disease Neg Hx    Rectal cancer Neg Hx    Stomach cancer Neg Hx      HOME MEDICATIONS: Allergies as of 11/25/2022       Reactions   Coconut (cocos Nucifera) Hives        Medication List        Accurate as of November 25, 2022 10:36 AM. If you have any questions, ask your nurse or doctor.          albuterol 108 (90 Base) MCG/ACT inhaler Commonly  known as: VENTOLIN HFA Inhale 1 puff into the lungs every 6 (six) hours as needed for wheezing or shortness of breath. ProAir   albuterol (2.5 MG/3ML) 0.083% nebulizer solution Commonly known as: PROVENTIL Take 2.5 mg by nebulization every 6 (six) hours as needed for wheezing or shortness of breath.   budesonide-formoterol 160-4.5 MCG/ACT inhaler Commonly known as: SYMBICORT Inhale 2 puffs into the lungs 2 (two) times daily.   cetirizine-pseudoephedrine 5-120 MG tablet Commonly known as: ZYRTEC-D Take 1 tablet by mouth daily.   clopidogrel  75 MG tablet Commonly known as: PLAVIX Take 75 mg by mouth daily.   dapagliflozin propanediol 10 MG Tabs tablet Commonly known as: Farxiga Take 1 tablet (10 mg total) by mouth daily before breakfast.   diclofenac Sodium 1 % Gel Commonly known as: VOLTAREN Apply topically.   dicyclomine 20 MG tablet Commonly known as: BENTYL Take 20 mg by mouth 4 (four) times daily as needed for spasms.   ferrous sulfate 325 (65 FE) MG tablet Take 325 mg by mouth daily with breakfast.   HumaLOG Mix 75/25 KwikPen (75-25) 100 UNIT/ML Kwikpen Generic drug: Insulin Lispro Prot & Lispro Inject 20 Units into the skin daily with breakfast AND 18 Units daily with supper.   icosapent Ethyl 1 g capsule Commonly known as: VASCEPA Take 2 capsules (2 g total) by mouth 2 (two) times daily.   Insulin Pen Needle 32G X 4 MM Misc 1 Device by Does not apply route in the morning and at bedtime.   lisinopril-hydrochlorothiazide 20-12.5 MG tablet Commonly known as: ZESTORETIC Take 1 tablet by mouth daily.   lubiprostone 24 MCG capsule Commonly known as: AMITIZA Take 24 mcg by mouth 2 (two) times daily with a meal.   mometasone 50 MCG/ACT nasal spray Commonly known as: NASONEX Place 2 sprays into the nose as needed.   multivitamin capsule Take 1 capsule by mouth daily.   naproxen 250 MG tablet Commonly known as: NAPROSYN Take 250 mg by mouth 2 (two) times  daily.   Olopatadine HCl 0.2 % Soln Apply 1 drop to eye daily. What changed:  when to take this reasons to take this   omeprazole 40 MG capsule Commonly known as: PRILOSEC TAKE 1 CAPSULE(40 MG) BY MOUTH DAILY   oxyCODONE-acetaminophen 10-325 MG tablet Commonly known as: PERCOCET Take 1 tablet by mouth every 4 (four) hours as needed.   pregabalin 200 MG capsule Commonly known as: LYRICA Take 200 mg by mouth 3 (three) times daily.   promethazine 25 MG tablet Commonly known as: PHENERGAN Take 25 mg by mouth every 6 (six) hours as needed for nausea or vomiting.   rosuvastatin 40 MG tablet Commonly known as: CRESTOR Take 1 tablet (40 mg total) by mouth daily.   Spiriva Respimat 2.5 MCG/ACT Aers Generic drug: Tiotropium Bromide Monohydrate INHALE 2 PUFFS INTO THE LUNGS DAILY   temazepam 7.5 MG capsule Commonly known as: RESTORIL Take 7.5 mg by mouth daily.   tirzepatide 5 MG/0.5ML Pen Commonly known as: MOUNJARO Inject 5 mg into the skin once a week.   tiZANidine 4 MG tablet Commonly known as: ZANAFLEX Take 4 mg by mouth 3 (three) times daily as needed.   Vitamin D (Ergocalciferol) 1.25 MG (50000 UNIT) Caps capsule Commonly known as: DRISDOL Take 50,000 Units by mouth every Tuesday.         OBJECTIVE:   Vital Signs: BP 122/70 (BP Location: Left Arm, Patient Position: Sitting, Cuff Size: Large)   Pulse 80   Ht 5\' 2"  (1.575 m)   Wt 211 lb (95.7 kg)   SpO2 99%   BMI 38.59 kg/m   Wt Readings from Last 3 Encounters:  11/25/22 211 lb (95.7 kg)  06/08/22 221 lb 6.4 oz (100.4 kg)  05/22/22 221 lb (100.2 kg)     Exam: General: Pt appears well and is in NAD  Lungs: Clear with good BS bilat   Heart: RRR   Extremities: No pretibial edema.   Neuro: MS is good with appropriate affect, pt is alert and Ox3  DM foot exam: 11/25/2022   The skin of the feet is intact without sores or ulcerations. The pedal pulses are 1+ on right and 1+ on left. The sensation  is intact to a screening 5.07, 10 gram monofilament bilaterally    DATA REVIEWED:  Lab Results  Component Value Date   HGBA1C 6.3 (A) 05/22/2022   HGBA1C 7.6 (A) 12/30/2021   HGBA1C 6.6 (A) 06/25/2021    Latest Reference Range & Units 12/30/21 13:31  Sodium 135 - 145 mEq/L 139  Potassium 3.5 - 5.1 mEq/L 3.9  Chloride 96 - 112 mEq/L 106  CO2 19 - 32 mEq/L 28  Glucose 70 - 99 mg/dL 93  BUN 6 - 23 mg/dL 22  Creatinine 1.61 - 0.96 mg/dL 0.45  Calcium 8.4 - 40.9 mg/dL 9.2  GFR >81.19 mL/min 48.06 (L)  Total CHOL/HDL Ratio  5  Cholesterol 0 - 200 mg/dL 147  HDL Cholesterol >82.95 mg/dL 62.13 (L)  LDL (calc) 0 - 99 mg/dL 85  NonHDL  086.57  Triglycerides 0.0 - 149.0 mg/dL 846.9 (H)  VLDL 0.0 - 62.9 mg/dL 52.8  Prolactin ng/mL 41.3  TSH 0.35 - 5.50 uIU/mL 2.36  T4,Free(Direct) 0.60 - 1.60 ng/dL 2.44    Latest Reference Range & Units 06/25/21 14:04  Creatinine,U mg/dL 01.0  Microalb, Ur 0.0 - 1.9 mg/dL <2.7  MICROALB/CREAT RATIO 0.0 - 30.0 mg/g 1.0   ASSESSMENT / PLAN / RECOMMENDATIONS:   1) Type 2 Diabetes Mellitus, Optimally controlled, With neuropathic, CKD III and macrovascular complications - Most recent A1c of 6.3 %. Goal A1c < 7.0 %.    - A1c is optimal -I praised the patient on weight loss, will increase Mounjaro -Will decrease insulin preemptively to prevent hypoglycemia  MEDICATIONS: -Decrease Humalog Mix  16 units with Breakfast and 16 units with supper -Continue Farxiga 10 mg  Daily  -Increase Mounjaro 7.5 mg weekly  EDUCATION / INSTRUCTIONS: BG monitoring instructions: Patient is instructed to check her blood sugars 2 times a day, fasting and dinner. Call [facility] if: BG persistently < 70  I reviewed the Rule of 15 for the treatment of hypoglycemia in detail with the patient. Literature supplied.   2) Diabetic complications:  Eye: Does not have known diabetic retinopathy.  Neuro/ Feet: Does have known diabetic peripheral neuropathy .   Renal: Patient does have known baseline CKD. She   is on an ACEI/ARB at present.     3) Dyslipidemia:  - LDL at goal with rosuvastatin -TG has trended down from 381 to 158 mg/dL in the past    Medications Continue rosuvastatin 20 mg daily  Continue Vascepa 2 g twice daily   4) Pituitary Microadenoma :  - Per pt, she has been diagnosed with pituitary adenoma in 2006. No prior sx . 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 - Pituitary hormone check was normal 06/2020  -She has slight elevation of prolactin in February 2023 but repeat testing 12/2021 shows normal prolactin and TFTs - MRI showed 4x 2 mm pituitary adenoma (06/2020) -Denies galactorrhea or local symptoms     F/U in 6 months    Signed electronically by: Lyndle Herrlich, MD  [facility] Endocrinology  [facility] [address] [place], [place] [address] Phone: [phone] FAX: [phone]   CC: Iona Hansen, NP [address] [place] [address] Phone:  [phone]  Fax: [phone]  Return to Endocrinology clinic as below: Future Appointments  Date Time Provider Department Center  12/07/2022  8:45 AM Patwardhan, Anabel Bene, MD [facility] None

## 2022-11-25 NOTE — Patient Instructions (Signed)
-   Increase Mounjaro 7.5 mg once weekly - Decrease Humalog Mix  16 units with Breakfast and 16 units with supper - Continue Farxiga 10 mg, 1 tablet every morning    HOW TO TREAT LOW BLOOD SUGARS (Blood sugar LESS THAN 70 MG/DL) Please follow the RULE OF 15 for the treatment of hypoglycemia treatment (when your (blood sugars are less than 70 mg/dL)   STEP 1: Take 15 grams of carbohydrates when your blood sugar is low, which includes:  3-4 GLUCOSE TABS  OR 3-4 OZ OF JUICE OR REGULAR SODA OR ONE TUBE OF GLUCOSE GEL    STEP 2: RECHECK blood sugar in 15 MINUTES STEP 3: If your blood sugar is still low at the 15 minute recheck --> then, go back to STEP 1 and treat AGAIN with another 15 grams of carbohydrates.

## 2022-12-04 ENCOUNTER — Other Ambulatory Visit (HOSPITAL_COMMUNITY): Payer: Self-pay | Admitting: Cardiology

## 2022-12-05 LAB — LIPID PANEL W/O CHOL/HDL RATIO
Cholesterol, Total: 121 mg/dL (ref 100–199)
HDL: 26 mg/dL — ABNORMAL LOW (ref >39)
LDL Chol Calc (NIH): 58 mg/dL (ref 0–99)
Triglycerides: 230 mg/dL — ABNORMAL HIGH (ref 0–149)
VLDL Cholesterol Cal: 37 mg/dL (ref 5–40)

## 2022-12-07 ENCOUNTER — Ambulatory Visit: Payer: 59 | Admitting: Cardiology

## 2022-12-07 ENCOUNTER — Encounter: Payer: Self-pay | Admitting: Cardiology

## 2022-12-07 VITALS — BP 124/64 | HR 80 | Resp 16 | Ht 62.0 in | Wt 209.0 lb

## 2022-12-07 DIAGNOSIS — Z794 Long term (current) use of insulin: Secondary | ICD-10-CM

## 2022-12-07 DIAGNOSIS — I739 Peripheral vascular disease, unspecified: Secondary | ICD-10-CM

## 2022-12-07 MED ORDER — CLOPIDOGREL BISULFATE 75 MG PO TABS: 75.0000 mg | ORAL_TABLET | Freq: Every day | ORAL | 3 refills | Status: DC

## 2022-12-07 MED ORDER — ROSUVASTATIN CALCIUM 40 MG PO TABS: 40.0000 mg | ORAL_TABLET | Freq: Every day | ORAL | 3 refills | Status: DC

## 2022-12-07 NOTE — Progress Notes (Signed)
Patient referred by Iona Hansen, NP for carotid artery calcification  Subjective:   Christina Solis, female    DOB: [birth year], 72 y.o.   MRN: [id number]   Chief Complaint  Patient presents with   PAD   Follow-up    6 month     HPI  72 y/o [place] American female with hypertension, hyperlipidemia, prior tobacco abuse, mod carotid artery stenosis, PAD  Patient is doing well, denies chest pain, shortness of breath, palpitations, leg edema, orthopnea, PND, TIA/syncope. She walks in the aisles of stores without any claudication symptoms. She only has right knee pain on walking.  Reviewed recent test results with the patient, details below.     Current Outpatient Medications:    albuterol (PROVENTIL) (2.5 MG/3ML) 0.083% nebulizer solution, Take 2.5 mg by nebulization every 6 (six) hours as needed for wheezing or shortness of breath., Disp: , Rfl:    albuterol (VENTOLIN HFA) 108 (90 Base) MCG/ACT inhaler, Inhale 1 puff into the lungs every 6 (six) hours as needed for wheezing or shortness of breath. ProAir, Disp: , Rfl:    budesonide-formoterol (SYMBICORT) 160-4.5 MCG/ACT inhaler, Inhale 2 puffs into the lungs 2 (two) times daily., Disp: , Rfl:    cetirizine-pseudoephedrine (ZYRTEC-D) 5-120 MG tablet, Take 1 tablet by mouth daily., Disp: 15 tablet, Rfl: 0   clopidogrel (PLAVIX) 75 MG tablet, Take 75 mg by mouth daily. , Disp: , Rfl: 1   dapagliflozin propanediol (FARXIGA) 10 MG TABS tablet, Take 1 tablet (10 mg total) by mouth daily before breakfast., Disp: 90 tablet, Rfl: 3   diclofenac Sodium (VOLTAREN) 1 % GEL, Apply topically., Disp: , Rfl:    dicyclomine (BENTYL) 20 MG tablet, Take 20 mg by mouth 4 (four) times daily as needed for spasms., Disp: , Rfl:    ferrous sulfate 325 (65 FE) MG tablet, Take 325 mg by mouth daily with breakfast. , Disp: , Rfl:    HUMALOG MIX 75/25 KWIKPEN (75-25) 100 UNIT/ML KwikPen, Inject 16 Units into the skin in the morning and at bedtime., Disp:  45 mL, Rfl: 3   icosapent Ethyl (VASCEPA) 1 g capsule, Take 2 capsules (2 g total) by mouth 2 (two) times daily., Disp: 360 capsule, Rfl: 2   lisinopril-hydrochlorothiazide (PRINZIDE,ZESTORETIC) 20-12.5 MG per tablet, Take 1 tablet by mouth daily. , Disp: , Rfl:    lubiprostone (AMITIZA) 24 MCG capsule, Take 24 mcg by mouth 2 (two) times daily with a meal., Disp: , Rfl:    mometasone (NASONEX) 50 MCG/ACT nasal spray, Place 2 sprays into the nose as needed., Disp: , Rfl:    Multiple Vitamin (MULTIVITAMIN) capsule, Take 1 capsule by mouth daily., Disp: , Rfl:    naproxen (NAPROSYN) 250 MG tablet, Take 250 mg by mouth 2 (two) times daily., Disp: , Rfl:    Olopatadine HCl 0.2 % SOLN, Apply 1 drop to eye daily. (Patient taking differently: Apply 1 drop to eye as needed.), Disp: 2.5 mL, Rfl: 0   omeprazole (PRILOSEC) 40 MG capsule, TAKE 1 CAPSULE(40 MG) BY MOUTH DAILY, Disp: 90 capsule, Rfl: 2   oxyCODONE-acetaminophen (PERCOCET) 10-325 MG tablet, Take 1 tablet by mouth every 4 (four) hours as needed., Disp: , Rfl:    pregabalin (LYRICA) 200 MG capsule, Take 200 mg by mouth 3 (three) times daily., Disp: , Rfl:    promethazine (PHENERGAN) 25 MG tablet, Take 25 mg by mouth every 6 (six) hours as needed for nausea or vomiting. , Disp: , Rfl:  rosuvastatin (CRESTOR) 40 MG tablet, Take 1 tablet (40 mg total) by mouth daily., Disp: 90 tablet, Rfl: 3   SPIRIVA RESPIMAT 2.5 MCG/ACT AERS, INHALE 2 PUFFS INTO THE LUNGS DAILY, Disp: 4 g, Rfl: 5   temazepam (RESTORIL) 7.5 MG capsule, Take 7.5 mg by mouth daily., Disp: , Rfl:    tirzepatide (MOUNJARO) 7.5 MG/0.5ML Pen, Inject 7.5 mg into the skin once a week., Disp: 6 mL, Rfl: 3   tiZANidine (ZANAFLEX) 4 MG tablet, Take 4 mg by mouth 3 (three) times daily as needed., Disp: , Rfl:    Vitamin D, Ergocalciferol, (DRISDOL) 50000 UNITS CAPS capsule, Take 50,000 Units by mouth every Tuesday., Disp: , Rfl:    Insulin Pen Needle 32G X 4 MM MISC, 1 Device by Does not apply  route in the morning and at bedtime., Disp: 200 each, Rfl: 3  Cardiovascular and other pertinent studies:  EKG 12/07/2022: Sinus rhythm 75 bpm Left bundle branch block  Lower Extremity Arterial Duplex 05/01/2022:  Diffuse dampened monophasic waveform pattern of the entire right lower  extremity may suggest hemodynamically proximal (CIA) stenosis. Severe  damping also n noted in the right mid SFA suggests >50% stenosis in the  proximal right SFA.  Left mid superficial femoral artery to distal superficial femoral artery  stent noted. There is ISR noted approximately 50%.  This exam reveals moderately decreased perfusion of the right lower  extremity, noted at the dorsalis pedis artery level (ABI 0.71) with  severely abnormal monophasic waveform pattern..   This exam reveals moderately decreased perfusion of the left lower  extremity, noted at the post tibial artery level (ABI 0.69) with severely  abnormal monophasic waveform pattern..  Compared to the study done on 08/06/2015, no significant change. Compared  to ABI only study 02/29/2020, right ABI 0.75, left ABI 1.00 with mildly  abnormal biphasic waveform pattern.   Carotid artery duplex 04/29/2022: Duplex suggests stenosis in the right internal carotid artery (50-69%), upper end of spectrum. Right ECA stenosis of <50%. Duplex suggests stenosis in the left internal carotid artery (50-69%), lower end of spectrum. Antegrade right vertebral artery flow. Antegrade left vertebral artery flow. Compared to 02/04/2021, no significant change. Follow up in six months is appropriate if clinically indicated.  Carotid artery duplex 02/04/2021:  Duplex suggests stenosis in the right internal carotid artery (50-69%).  Duplex suggests stenosis in the right external carotid artery (<50%).  Duplex suggests stenosis in the left internal carotid artery (50-69%).  Duplex suggests stenosis in the left external carotid artery (<50%).  Antegrade right  vertebral artery flow. Antegrade left vertebral artery  flow.  Compared to the study done on 09/10/2020, no significant change. Follow up  in six months is appropriate if clinically indicated.  ABI 02/28/2020:  This exam reveals moderately decreased perfusion of the right lower  extremity, noted at the dorsalis pedis and post tibial artery level (ABI  0.75) and normal perfusion of the left lower extremity (ABI 1.00).  There is mildly abnormal biphasic waveform at the ankles.  Recent labs: 08/12/2022: Glucose 86, BUN/Cr 19/1.36. EGFR 42. Na/K 142/4.3. Rest of the CMP normal H/H 12/36. MCV 93. Platelets 240 Chol 114, TG 168, HDL 27, LDL 62  12/30/2021: Glucose 93, BUN/Cr 22/1.16. EGFR 48. Na/K 139/3.9.  H/H 145/158. MCV 28. Platelets 85  Latest Reference Range & Units 06/25/21 13:43 12/30/21 13:09 05/22/22 11:43  Hemoglobin A1C 4.0 - 5.6 % 6.6 ! 7.6 ! 6.3 !    02/21/2021: Glucose 94, BUN/Cr 24/1.15. EGFR  48. Na/K 142/3.8.  H/H 12/39. MCV 94. Platelets 250 Chol 121, TG 297, HDL 21, LDL 41 HbA1C 6.8%   Review of Systems  Cardiovascular:  Negative for chest pain, dyspnea on exertion, leg swelling, palpitations and syncope.         Vitals:   12/07/22 0821  BP: 124/64  Pulse: 80  Resp: 16  SpO2: 95%     Body mass index is 38.23 kg/m. Filed Weights   12/07/22 0821  Weight: 209 lb (94.8 kg)     Objective:   Physical Exam Vitals and nursing note reviewed.  Constitutional:      General: She is not in acute distress. Neck:     Vascular: No JVD.  Cardiovascular:     Rate and Rhythm: Normal rate and regular rhythm.     Pulses:          Femoral pulses are 0 on the right side and 0 on the left side.      Popliteal pulses are 1+ on the right side and 1+ on the left side.       Dorsalis pedis pulses are 1+ on the right side and 1+ on the left side.       Posterior tibial pulses are 0 on the right side and 0 on the left side.     Heart sounds: Normal heart sounds. No  murmur heard. Pulmonary:     Effort: Pulmonary effort is normal.     Breath sounds: Normal breath sounds. No wheezing or rales.  Musculoskeletal:     Right lower leg: No edema.     Left lower leg: No edema.            Assessment & Recommendations:   72 y/o [place] female with hypertension, hyperlipidemia, prior tobacco abuse, mod carotid artery stenosis, PAD  Carotid artery stenosis: Mod b/l carotid artery stenoses. Asymptomatic. Continue Plavix, statin With ongoing use of Plavix, stopped omeprazole.  If PPI needed, could consider pantoprazole. Carotid duplex in 04/2023.  PAD: Multilevel disease, likely inflow and outflow. No claudication. No CLI. Continue medical management. Continue Plavix, Crestor 40 mg daily.  F/u in 1 year    Elder Negus, MD Pager: [phone] Office: [phone]

## 2022-12-17 HISTORY — PX: CARPAL TUNNEL RELEASE: SHX101

## 2022-12-18 ENCOUNTER — Encounter: Payer: Self-pay | Admitting: Nurse Practitioner

## 2022-12-25 ENCOUNTER — Other Ambulatory Visit: Payer: Self-pay | Admitting: Orthopedic Surgery

## 2022-12-25 DIAGNOSIS — M25511 Pain in right shoulder: Secondary | ICD-10-CM

## 2023-01-13 ENCOUNTER — Ambulatory Visit: Payer: 59

## 2023-01-22 ENCOUNTER — Ambulatory Visit
Admission: RE | Admit: 2023-01-22 | Discharge: 2023-01-22 | Disposition: A | Discharge status: Home / Self Care | Payer: 59 | Source: Ambulatory Visit | Attending: Orthopedic Surgery | Admitting: Orthopedic Surgery

## 2023-01-22 ENCOUNTER — Ambulatory Visit: Payer: Self-pay | Admitting: Surgery

## 2023-01-22 DIAGNOSIS — M25511 Pain in right shoulder: Secondary | ICD-10-CM

## 2023-01-29 ENCOUNTER — Inpatient Hospital Stay: Admission: RE | Admit: 2023-01-29 | Payer: 59 | Source: Ambulatory Visit

## 2023-03-19 ENCOUNTER — Telehealth: Payer: Self-pay | Admitting: *Deleted

## 2023-03-19 ENCOUNTER — Telehealth: Payer: Self-pay | Admitting: Cardiology

## 2023-03-19 NOTE — Telephone Encounter (Signed)
   Name: Christina Solis  DOB: Jul 03, 1950  MRN: [id number]  Primary Cardiologist: Elder Negus, MD   Preoperative team, please contact this patient and set up a phone call appointment for further preoperative risk assessment. Please obtain consent and complete medication review. Thank you for your help.  I confirm that guidance regarding antiplatelet and oral anticoagulation therapy has been completed and, if necessary, noted below.  Per office protocol, he may hold Plavix for 5 days prior to procedure and should resume as soon as hemodynamically stable postoperatively.   I also confirmed the patient resides in the state of [place]. As per [facility] Medical Board telemedicine laws, the patient must reside in the state in which the provider is licensed.   Carlos Levering, NP 03/19/2023, 12:28 PM [facility]

## 2023-03-19 NOTE — Telephone Encounter (Signed)
Pt has been informed per the pre op app  to stop Plavix as of today 03/19/23, pt verbalized understanding to plan of care.

## 2023-03-19 NOTE — Telephone Encounter (Signed)
   Pre-operative Risk Assessment    Patient Name: Christina Solis  DOB: 1950/12/09 MRN: [id number]     URGENT REQUEST  Request for Surgical Clearance    Procedure:   Shoulder Arthroscopy, rotator cuff repair   Date of Surgery:  Clearance 03/23/23                                 Surgeon:  Dr. Ave Filter  Surgeon's Group or Practice Name:  [facility]    Phone number:  [phone] Fax number:  [phone]   Type of Clearance Requested:   - Medical  - Pharmacy:  Hold Clopidogrel (Plavix)     Type of Anesthesia:   choice    Additional requests/questions:    Alben Spittle   03/19/2023, 11:56 AM

## 2023-03-19 NOTE — Telephone Encounter (Signed)
Per pre op APP to add pt to 03/22/23 at 3:20 due to med hold and date of procedure. Med rec and consent are done.

## 2023-03-19 NOTE — Telephone Encounter (Signed)
Per pre op APP to add pt to 03/22/23 at 3:20 due to med hold and date of procedure. Med rec and consent are done.     Patient Consent for Virtual Visit        Christina Solis has provided verbal consent on 03/19/2023 for a virtual visit (video or telephone).   CONSENT FOR VIRTUAL VISIT FOR:  Christina Solis  By participating in this virtual visit I agree to the following:  I hereby voluntarily request, consent and authorize [facility] and its employed or contracted physicians, physician assistants, nurse practitioners or other licensed health care professionals (the Practitioner), to provide me with telemedicine health care services (the "Services") as deemed necessary by the treating Practitioner. I acknowledge and consent to receive the Services by the Practitioner via telemedicine. I understand that the telemedicine visit will involve communicating with the Practitioner through live audiovisual communication technology and the disclosure of certain medical information by electronic transmission. I acknowledge that I have been given the opportunity to request an in-person assessment or other available alternative prior to the telemedicine visit and am voluntarily participating in the telemedicine visit.  I understand that I have the right to withhold or withdraw my consent to the use of telemedicine in the course of my care at any time, without affecting my right to future care or treatment, and that the Practitioner or I may terminate the telemedicine visit at any time. I understand that I have the right to inspect all information obtained and/or recorded in the course of the telemedicine visit and may receive copies of available information for a reasonable fee.  I understand that some of the potential risks of receiving the Services via telemedicine include:  Delay or interruption in medical evaluation due to technological equipment failure or disruption; Information transmitted may not  be sufficient (e.g. poor resolution of images) to allow for appropriate medical decision making by the Practitioner; and/or  In rare instances, security protocols could fail, causing a breach of personal health information.  Furthermore, I acknowledge that it is my responsibility to provide information about my medical history, conditions and care that is complete and accurate to the best of my ability. I acknowledge that Practitioner's advice, recommendations, and/or decision may be based on factors not within their control, such as incomplete or inaccurate data provided by me or distortions of diagnostic images or specimens that may result from electronic transmissions. I understand that the practice of medicine is not an exact science and that Practitioner makes no warranties or guarantees regarding treatment outcomes. I acknowledge that a copy of this consent can be made available to me via my patient portal [facility] MyChart), or I can request a printed copy by calling the office of [facility].    I understand that my insurance will be billed for this visit.   I have read or had this consent read to me. I understand the contents of this consent, which adequately explains the benefits and risks of the Services being provided via telemedicine.  I have been provided ample opportunity to ask questions regarding this consent and the Services and have had my questions answered to my satisfaction. I give my informed consent for the services to be provided through the use of telemedicine in my medical care

## 2023-03-22 ENCOUNTER — Ambulatory Visit: Payer: 59 | Attending: Cardiology | Admitting: Student

## 2023-03-22 NOTE — Progress Notes (Signed)
Virtual Visit via Telephone Note   Because of Christina Solis's co-morbid illnesses, she is at least at moderate risk for complications without adequate follow up.  This format is felt to be most appropriate for this patient at this time.  The patient did not have access to video technology/had technical difficulties with video requiring transitioning to audio format only (telephone).  All issues noted in this document were discussed and addressed.  No physical exam could be performed with this format.  Please refer to the patient's chart for her consent to telehealth for [facility].  Evaluation Performed:  Preoperative cardiovascular risk assessment _____________   Date:  03/22/2023   Patient ID:  Christina Solis, DOB 1951/04/30, MRN [id number] Patient Location:  Home Provider location:   Office  Primary Care Provider:  Iona Hansen, NP Primary Cardiologist:  Elder Negus, MD  Chief Complaint / Patient Profile   72 y.o. y/o female with a h/o chronic diastolic heart failure, PAD s/p PTA and stenting of left SFA February 2014, LBBB, atrial tachycardia, carotid artery stenosis, hypertension, hyperlipidemia, COPD, GERD, IDDM, CKD stage III, fibromyalgia who is pending shoulder arthroscopy/rotator cuff repair by Dr. Ave Filter on 03/23/2023 and presents today for telephonic preoperative cardiovascular risk assessment.  History of Present Illness    Christina Solis is a 72 y.o. female who presents via audio/video conferencing for a telehealth visit today.  Pt was last seen in cardiology clinic on 12/07/2022 by Dr. Rosemary Holms.  At that time ADAN BEAL was stable from a cardiac standpoint.  The patient is now pending procedure as outlined above. Since her last visit, she is doing well. Patient denies shortness of breath, dyspnea on exertion, lower extremity edema, orthopnea or PND. No chest pain, pressure, or tightness. No palpitations. She stays active performing light to  moderate household activities. She is also caregiver for her great grandchildren ages 58-11.   Past Medical History    Past Medical History:  Diagnosis Date   Anemia    sickle cell trait   Anxiety    Arthritis    "knees" (10/12/2013)   CHF (congestive heart failure) (HCC)    Chronic back pain    Chronic bronchitis (HCC)    "got it q yr when I lived in [place]"   Complication of anesthesia    hard to put under   COPD (chronic obstructive pulmonary disease) (HCC)    Dysrhythmia    Family history of anesthesia complication    sister hard to wake up   Family history of anesthesia complication    niece have itching   Fibromyalgia    GERD (gastroesophageal reflux disease)    Hyperlipidemia    Hypertension    Irregular heart beat    Migraine    "used to get them alot; not regular anymore" (10/12/2013)   PAD (peripheral artery disease) (HCC)    Pituitary tumor    followed at [facility]; on cabergoline 09/2013   Pneumonia    "twice"   Type II diabetes mellitus (HCC)    Past Surgical History:  Procedure Laterality Date   ABDOMINAL AORTAGRAM N/A 06/28/2012   Procedure: ABDOMINAL Ronny Flurry;  Surgeon: Christina Pert, MD;  Location: [facility] CATH LAB;  Service: Cardiovascular;  Laterality: N/A;   APPENDECTOMY     BACK SURGERY     x 2, lower back   BILATERAL CARPAL TUNNEL RELEASE     CARDIAC CATHETERIZATION     CATARACT EXTRACTION Left 2023  CATARACT EXTRACTION Right 2023   CESAREAN SECTION  1974; 1976; 1978   CHOLECYSTECTOMY N/A 10/12/2013   Procedure: LAPAROSCOPIC CHOLECYSTECTOMY WITH INTRAOPERATIVE CHOLANGIOGRAM;  Surgeon: Wilmon Arms. Corliss Skains, MD;  Location: [facility] OR;  Service: General;  Laterality: N/A;   COLONOSCOPY     FEMORAL ARTERY STENT Left 06/28/2012   SFA   LAPAROSCOPIC CHOLECYSTECTOMY  10/12/2013   LEFT HEART CATHETERIZATION WITH CORONARY ANGIOGRAM N/A 06/28/2012   Procedure: LEFT HEART CATHETERIZATION WITH CORONARY ANGIOGRAM;  Surgeon: Christina Pert, MD;   Location: [facility] CATH LAB;  Service: Cardiovascular;  Laterality: N/A;   LOWER EXTREMITY ANGIOGRAM N/A 06/28/2012   Procedure: LOWER EXTREMITY ANGIOGRAM;  Surgeon: Christina Pert, MD;  Location: [facility] CATH LAB;  Service: Cardiovascular;  Laterality: N/A;   PERIPHERAL VASCULAR CATHETERIZATION N/A 09/10/2015   Procedure: Abdominal Aortogram w/Lower Extremity;  Surgeon: Yates Decamp, MD;  Location: [facility] INVASIVE CV LAB;  Service: Cardiovascular;  Laterality: N/A;   POSTERIOR LUMBAR FUSION     REPLACEMENT TOTAL KNEE Left    TONSILLECTOMY     TUBAL LIGATION  1978    Allergies  Allergies  Allergen Reactions   Coconut (Cocos Nucifera) Hives    Home Medications    Prior to Admission medications   Medication Sig Start Date End Date Taking? Authorizing Provider  albuterol (PROVENTIL) (2.5 MG/3ML) 0.083% nebulizer solution Take 2.5 mg by nebulization every 6 (six) hours as needed for wheezing or shortness of breath. 06/30/15   [provider]  albuterol (VENTOLIN HFA) 108 (90 Base) MCG/ACT inhaler Inhale 1 puff into the lungs every 6 (six) hours as needed for wheezing or shortness of breath. ProAir    [provider]  budesonide-formoterol (SYMBICORT) 160-4.5 MCG/ACT inhaler Inhale 2 puffs into the lungs 2 (two) times daily.    [provider]  cetirizine-pseudoephedrine (ZYRTEC-D) 5-120 MG tablet Take 1 tablet by mouth daily. 08/27/17   Cathie Hoops, Amy V, PA-C  clopidogrel (PLAVIX) 75 MG tablet Take 1 tablet (75 mg total) by mouth daily. 12/07/22   Patwardhan, Anabel Bene, MD  dapagliflozin propanediol (FARXIGA) 10 MG TABS tablet Take 1 tablet (10 mg total) by mouth daily before breakfast. 11/25/22   Shamleffer, Konrad Dolores, MD  diclofenac Sodium (VOLTAREN) 1 % GEL Apply topically.    [provider]  dicyclomine (BENTYL) 20 MG tablet Take 20 mg by mouth 4 (four) times daily as needed for spasms. 09/06/13   [provider]  ferrous sulfate 325 (65 FE) MG tablet Take 325 mg  by mouth daily with breakfast.  07/30/15   [provider]  HUMALOG MIX 75/25 KWIKPEN (75-25) 100 UNIT/ML KwikPen Inject 16 Units into the skin in the morning and at bedtime. 11/25/22   Shamleffer, Konrad Dolores, MD  icosapent Ethyl (VASCEPA) 1 g capsule Take 2 capsules (2 g total) by mouth 2 (two) times daily. 11/25/22   Shamleffer, Konrad Dolores, MD  Insulin Pen Needle 32G X 4 MM MISC 1 Device by Does not apply route in the morning and at bedtime. 05/22/22   Shamleffer, Konrad Dolores, MD  lisinopril-hydrochlorothiazide (PRINZIDE,ZESTORETIC) 20-12.5 MG per tablet Take 1 tablet by mouth daily.     [provider]  lubiprostone (AMITIZA) 24 MCG capsule Take 24 mcg by mouth 2 (two) times daily with a meal.    [provider]  mometasone (NASONEX) 50 MCG/ACT nasal spray Place 2 sprays into the nose as needed. 09/18/21   [provider]  Multiple Vitamin (MULTIVITAMIN) capsule Take 1 capsule by mouth daily.  [provider]  naproxen (NAPROSYN) 250 MG tablet Take 250 mg by mouth 2 (two) times daily. 09/13/21   [provider]  Olopatadine HCl 0.2 % SOLN Apply 1 drop to eye daily. Patient taking differently: Apply 1 drop to eye as needed. 08/27/17   Belinda Fisher, PA-C  oxyCODONE-acetaminophen (PERCOCET) 10-325 MG tablet Take 1 tablet by mouth every 4 (four) hours as needed. 04/07/19   [provider]  pregabalin (LYRICA) 200 MG capsule Take 200 mg by mouth 3 (three) times daily.    [provider]  promethazine (PHENERGAN) 25 MG tablet Take 25 mg by mouth every 6 (six) hours as needed for nausea or vomiting.  07/30/15   [provider]  rosuvastatin (CRESTOR) 40 MG tablet Take 1 tablet (40 mg total) by mouth daily. 12/07/22   Patwardhan, Anabel Bene, MD  SPIRIVA RESPIMAT 2.5 MCG/ACT AERS INHALE 2 PUFFS INTO THE LUNGS DAILY 10/17/19   Luciano Cutter, MD  temazepam (RESTORIL) 7.5 MG capsule Take 7.5 mg by mouth daily.    [provider]  tirzepatide Greggory Keen) 7.5 MG/0.5ML Pen Inject 7.5 mg into the skin once a week. 11/25/22   Shamleffer, Konrad Dolores, MD  tiZANidine (ZANAFLEX) 4 MG tablet Take 4 mg by mouth 3 (three) times daily as needed. 09/25/21   [provider]  Vitamin D, Ergocalciferol, (DRISDOL) 50000 UNITS CAPS capsule Take 50,000 Units by mouth every Tuesday.    [provider]    Physical Exam    Vital Signs:  AHUVA POYNOR does not have vital signs available for review today.  Given telephonic nature of communication, physical exam is limited. AAOx3. NAD. Normal affect.  Speech and respirations are unlabored.  Accessory Clinical Findings    None  Assessment & Plan    Primary Cardiologist: Elder Negus, MD  Preoperative cardiovascular risk assessment.  Shoulder arthroscopy/rotator cuff repair with Dr. Ave Filter on 03/23/2023.  Chart reviewed as part of pre-operative protocol coverage. According to the RCRI, patient has a 6.6% risk of MACE. Patient reports activity equivalent to 4.0 METS (performs light to moderate household activities, is caregiver for her great grandchildren ages 67-11).   Given past medical history and time since last visit, based on ACC/AHA guidelines, MARYCRUZ BOEHNER would be at acceptable risk for the planned procedure without further cardiovascular testing.   Patient was advised that if she develops new symptoms prior to surgery to contact our office to arrange a follow-up appointment.  she verbalized understanding.  Per office protocol, he may hold Plavix for 5 days prior to procedure and should resume as soon as hemodynamically stable postoperatively.   I will route this recommendation to the requesting party via Epic fax function.  Please call with questions.  Time:   Today, I have spent 5 minutes with the patient with telehealth technology discussing medical history, symptoms, and management plan.     Carlos Levering,  NP  03/22/2023, 10:24 AM

## 2023-04-25 ENCOUNTER — Other Ambulatory Visit: Payer: Self-pay | Admitting: Internal Medicine

## 2023-04-29 ENCOUNTER — Inpatient Hospital Stay: Admission: RE | Admit: 2023-04-29 | Payer: 59 | Source: Ambulatory Visit

## 2023-05-28 ENCOUNTER — Ambulatory Visit: Payer: 59 | Admitting: Internal Medicine

## 2023-05-28 NOTE — Progress Notes (Deleted)
 Name: Christina Solis  Age/ Sex: 73 y.o., female   MRN/ DOB: [id number], Dec 26, 1950     PCP: Joshua Santana CROME, NP   Reason for Endocrinology Evaluation: Type 2 Diabetes Mellitus  Initial Endocrine Consultative Visit:  05/03/2019    PATIENT IDENTIFIER: Christina Solis is a 73 y.o. female with a past medical history of HTN, COPD, and T2DM. The patient has followed with Endocrinology clinic since 05/03/2019 for consultative assistance with management of her diabetes.  DIABETIC HISTORY:  Christina Solis was diagnosed with T2 DM in 2008 .She was on metformin that was stopped due to CKD, has been on insulin  since 2009. Her hemoglobin A1c has ranged from 6.9% in 2015, peaking at 7.4% in 2020.  On her initial visit to our clinic she had an A1c of 7.4%, she was on Humalog  mix, which was continued and adjusted.   Started Ozempic  12/2021  Switch Ozempic  to Mounjaro  05/2022 due to insurance coverage  PITUITARY TUMOR :  She was first diagnosed with pituitary adenoma in 2006 . She was put on medication for ~ 8 month but when her level normalized ,the medicine was stopped. She had galactorrhea up to 2018.  Repeat prolactin in 2022 was normal with appropriately elevated FSH and normal IGF  Prolactin was elevated in 2023   She is a retired engineer, civil (consulting) Lives with daughter SUBJECTIVE:   During the last visit (11/25/2022): A1c 6.3%      Today (05/28/2023): Christina Solis is here for follow-up on diabetes management. She checks her blood sugars occasionally.  The patient has not had hypoglycemic episodes since the last clinic visit.   She has been noted with weight loss   Denies nausea, vomiting  Denies  diarrhea , but has occasional constipation that she attributes to pain meds  Denies headaches  No galactorrhea  She had cataract surgeries in the fall 2023   HOME DIABETES REGIMEN:  Humalog  mix 16 units BID Farxiga  10 mg daily  Mounjaro  7.5 mg weekly Rosuvastatin  20 mg daily   Statin:  Yes ACE-I/ARB: Yes    METER DOWNLOAD SUMMARY: Date range evaluated: 6/11-7/02/2023 Average Number Tests = 18 Overall Mean FS Glucose = 130   BG Ranges Low = 73 High = 187   Hypoglycemic Events/30 Days: BG < 50 = 0 Episodes of symptomatic severe hypoglycemia = 0    DIABETIC COMPLICATIONS: Microvascular complications:  Neuropathy, CKD III, right eye cataract  Denies: retinopathy Last eye exam: 02/2022   Macrovascular complications:  CHF, PVD Denies: CAD, CVA    HISTORY:  Past Medical History:  Past Medical History:  Diagnosis Date   Anemia    sickle cell trait   Anxiety    Arthritis    knees (10/12/2013)   CHF (congestive heart failure) (HCC)    Chronic back pain    Chronic bronchitis (HCC)    got it q yr when I lived in [place]   Complication of anesthesia    hard to put under   COPD (chronic obstructive pulmonary disease) (HCC)    Dysrhythmia    Family history of anesthesia complication    sister hard to wake up   Family history of anesthesia complication    niece have itching   Fibromyalgia    GERD (gastroesophageal reflux disease)    Hyperlipidemia    Hypertension    Irregular heart beat    Migraine    used to get them alot; not regular anymore (10/12/2013)   PAD (peripheral artery disease) (  [facility])    Pituitary tumor    followed at [facility]; on cabergoline  09/2013   Pneumonia    twice   Type II diabetes mellitus [facility])    Past Surgical History:  Past Surgical History:  Procedure Laterality Date   ABDOMINAL AORTAGRAM N/A 06/28/2012   Procedure: ABDOMINAL EZELLA;  Surgeon: Erick JONELLE Bergamo, MD;  Location: [facility] CATH LAB;  Service: Cardiovascular;  Laterality: N/A;   APPENDECTOMY     BACK SURGERY     x 2, lower back   BILATERAL CARPAL TUNNEL RELEASE     CARDIAC CATHETERIZATION     CATARACT EXTRACTION Left 2023   CATARACT EXTRACTION Right 2023   CESAREAN SECTION  1974; 1976; 1978   CHOLECYSTECTOMY N/A 10/12/2013    Procedure: LAPAROSCOPIC CHOLECYSTECTOMY WITH INTRAOPERATIVE CHOLANGIOGRAM;  Surgeon: Donnice POUR. Belinda, MD;  Location: [facility] OR;  Service: General;  Laterality: N/A;   COLONOSCOPY     FEMORAL ARTERY STENT Left 06/28/2012   SFA   LAPAROSCOPIC CHOLECYSTECTOMY  10/12/2013   LEFT HEART CATHETERIZATION WITH CORONARY ANGIOGRAM N/A 06/28/2012   Procedure: LEFT HEART CATHETERIZATION WITH CORONARY ANGIOGRAM;  Surgeon: Erick JONELLE Bergamo, MD;  Location: [facility] CATH LAB;  Service: Cardiovascular;  Laterality: N/A;   LOWER EXTREMITY ANGIOGRAM N/A 06/28/2012   Procedure: LOWER EXTREMITY ANGIOGRAM;  Surgeon: Erick JONELLE Bergamo, MD;  Location: [facility] CATH LAB;  Service: Cardiovascular;  Laterality: N/A;   PERIPHERAL VASCULAR CATHETERIZATION N/A 09/10/2015   Procedure: Abdominal Aortogram w/Lower Extremity;  Surgeon: Gordy Bergamo, MD;  Location: [facility] INVASIVE CV LAB;  Service: Cardiovascular;  Laterality: N/A;   POSTERIOR LUMBAR FUSION     REPLACEMENT TOTAL KNEE Left    TONSILLECTOMY     TUBAL LIGATION  1978   Social History:  reports that she quit smoking about 7 years ago. Her smoking use included cigarettes. She started smoking about 22 years ago. She has a 23 pack-year smoking history. She has never used smokeless tobacco. She reports that she does not drink alcohol and does not use drugs. Family History:  Family History  Problem Relation Age of Onset   Diabetes Mother    Kidney disease Mother    Cancer Father    Pancreatic cancer Father    Kidney disease Brother    Cancer Brother    Colon cancer Neg Hx    Inflammatory bowel disease Neg Hx    Esophageal cancer Neg Hx    Liver disease Neg Hx    Rectal cancer Neg Hx    Stomach cancer Neg Hx      HOME MEDICATIONS: Allergies as of 05/28/2023       Reactions   Coconut (cocos Nucifera) Hives        Medication List        Accurate as of May 28, 2023  7:18 AM. If you have any questions, ask your nurse or doctor.          albuterol  108 (90 Base)  MCG/ACT inhaler Commonly known as: VENTOLIN  HFA Inhale 1 puff into the lungs every 6 (six) hours as needed for wheezing or shortness of breath. ProAir    albuterol  (2.5 MG/3ML) 0.083% nebulizer solution Commonly known as: PROVENTIL  Take 2.5 mg by nebulization every 6 (six) hours as needed for wheezing or shortness of breath.   budesonide -formoterol  160-4.5 MCG/ACT inhaler Commonly known as: SYMBICORT  Inhale 2 puffs into the lungs 2 (two) times daily.   cetirizine -pseudoephedrine  5-120 MG tablet Commonly known as: ZYRTEC -D Take 1 tablet by mouth daily.  clopidogrel  75 MG tablet Commonly known as: PLAVIX  Take 1 tablet (75 mg total) by mouth daily.   dapagliflozin  propanediol 10 MG Tabs tablet Commonly known as: Farxiga  Take 1 tablet (10 mg total) by mouth daily before breakfast.   diclofenac Sodium 1 % Gel Commonly known as: VOLTAREN Apply topically.   dicyclomine  20 MG tablet Commonly known as: BENTYL  Take 20 mg by mouth 4 (four) times daily as needed for spasms.   ferrous sulfate  325 (65 FE) MG tablet Take 325 mg by mouth daily with breakfast.   HumaLOG  Mix 75/25 KwikPen (75-25) 100 UNIT/ML KwikPen Generic drug: Insulin  Lispro Prot & Lispro Inject 16 Units into the skin in the morning and at bedtime.   icosapent  Ethyl 1 g capsule Commonly known as: VASCEPA  Take 2 capsules (2 g total) by mouth 2 (two) times daily.   Insulin  Pen Needle 32G X 4 MM Misc 1 Device by Does not apply route in the morning and at bedtime.   lisinopril -hydrochlorothiazide  20-12.5 MG tablet Commonly known as: ZESTORETIC  Take 1 tablet by mouth daily.   lubiprostone 24 MCG capsule Commonly known as: AMITIZA Take 24 mcg by mouth 2 (two) times daily with a meal.   mometasone  50 MCG/ACT nasal spray Commonly known as: NASONEX  Place 2 sprays into the nose as needed.   multivitamin capsule Take 1 capsule by mouth daily.   naproxen 250 MG tablet Commonly known as: NAPROSYN Take 250 mg by  mouth 2 (two) times daily.   Olopatadine  HCl 0.2 % Soln Apply 1 drop to eye daily. What changed:  when to take this reasons to take this   oxyCODONE -acetaminophen  10-325 MG tablet Commonly known as: PERCOCET Take 1 tablet by mouth every 4 (four) hours as needed.   pregabalin  200 MG capsule Commonly known as: LYRICA  Take 200 mg by mouth 3 (three) times daily.   promethazine  25 MG tablet Commonly known as: PHENERGAN  Take 25 mg by mouth every 6 (six) hours as needed for nausea or vomiting.   rosuvastatin  40 MG tablet Commonly known as: CRESTOR  Take 1 tablet (40 mg total) by mouth daily.   Spiriva  Respimat 2.5 MCG/ACT Aers Generic drug: Tiotropium Bromide Monohydrate  INHALE 2 PUFFS INTO THE LUNGS DAILY   temazepam 7.5 MG capsule Commonly known as: RESTORIL Take 7.5 mg by mouth daily.   tirzepatide  7.5 MG/0.5ML Pen Commonly known as: MOUNJARO  Inject 7.5 mg into the skin once a week.   tiZANidine  4 MG tablet Commonly known as: ZANAFLEX  Take 4 mg by mouth 3 (three) times daily as needed.   Vitamin D (Ergocalciferol) 1.25 MG (50000 UNIT) Caps capsule Commonly known as: DRISDOL Take 50,000 Units by mouth every Tuesday.         OBJECTIVE:   Vital Signs: There were no vitals taken for this visit.  Wt Readings from Last 3 Encounters:  12/07/22 209 lb (94.8 kg)  11/25/22 211 lb (95.7 kg)  06/08/22 221 lb 6.4 oz (100.4 kg)     Exam: General: Pt appears well and is in NAD  Lungs: Clear with good BS bilat   Heart: RRR   Extremities: No pretibial edema.   Neuro: MS is good with appropriate affect, pt is alert and Ox3    DM foot exam: 11/25/2022   The skin of the feet is intact without sores or ulcerations. The pedal pulses are 1+ on right and 1+ on left. The sensation is intact to a screening 5.07, 10 gram monofilament bilaterally    DATA REVIEWED:  Lab Results  Component Value Date   HGBA1C 6.3 (A) 11/25/2022   HGBA1C 6.3 (A) 05/22/2022   HGBA1C 7.6 (A)  12/30/2021    Latest Reference Range & Units 12/30/21 13:31  Sodium 135 - 145 mEq/L 139  Potassium 3.5 - 5.1 mEq/L 3.9  Chloride 96 - 112 mEq/L 106  CO2 19 - 32 mEq/L 28  Glucose 70 - 99 mg/dL 93  BUN 6 - 23 mg/dL 22  Creatinine 9.59 - 8.79 mg/dL 8.83  Calcium  8.4 - 10.5 mg/dL 9.2  GFR >39.99 mL/min 48.06 (L)  Total CHOL/HDL Ratio  5  Cholesterol 0 - 200 mg/dL 854  HDL Cholesterol >60.99 mg/dL 71.99 (L)  LDL (calc) 0 - 99 mg/dL 85  NonHDL  882.98  Triglycerides 0.0 - 149.0 mg/dL 841.9 (H)  VLDL 0.0 - 59.9 mg/dL 68.3  Prolactin ng/mL 79.4  TSH 0.35 - 5.50 uIU/mL 2.36  T4,Free(Direct) 0.60 - 1.60 ng/dL 9.33    Latest Reference Range & Units 06/25/21 14:04  Creatinine,U mg/dL 27.8  Microalb, Ur 0.0 - 1.9 mg/dL <9.2  MICROALB/CREAT RATIO 0.0 - 30.0 mg/g 1.0   ASSESSMENT / PLAN / RECOMMENDATIONS:   1) Type 2 Diabetes Mellitus, Optimally controlled, With neuropathic, CKD III and macrovascular complications - Most recent A1c of 6.3 %. Goal A1c < 7.0 %.    - A1c is optimal -I praised the patient on weight loss, will increase Mounjaro  -Will decrease insulin  preemptively to prevent hypoglycemia  MEDICATIONS: -Decrease Humalog  Mix  16 units with Breakfast and 16 units with supper -Continue Farxiga  10 mg  Daily  -Increase Mounjaro  7.5 mg weekly  EDUCATION / INSTRUCTIONS: BG monitoring instructions: Patient is instructed to check her blood sugars 2 times a day, fasting and dinner. Call [facility] if: BG persistently < 70  I reviewed the Rule of 15 for the treatment of hypoglycemia in detail with the patient. Literature supplied.   2) Diabetic complications:  Eye: Does not have known diabetic retinopathy.  Neuro/ Feet: Does have known diabetic peripheral neuropathy .  Renal: Patient does have known baseline CKD. She   is on an ACEI/ARB at present.     3) Dyslipidemia:  - LDL at goal with rosuvastatin  -TG has trended down from 381 to 158 mg/dL in the past     Medications Continue rosuvastatin  20 mg daily  Continue Vascepa  2 g twice daily   4) Pituitary Microadenoma :  - Per pt, she has been diagnosed with pituitary adenoma in 2006. No prior sx . 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 - Pituitary hormone check was normal 06/2020  -She has slight elevation of prolactin in February 2023 but repeat testing 12/2021 shows normal prolactin and TFTs - MRI showed 4x 2 mm pituitary adenoma (06/2020) -Denies galactorrhea or local symptoms     F/U in 6 months    Signed electronically by: Stefano Redgie Butts, MD  [facility]  [facility] [address] [place] [place], [place] [address] Phone: [phone] FAX: [phone]   CC: Joshua Santana CROME, NP [address] [place] [address] Phone: [phone]  Fax: [phone]  Return to Endocrinology clinic as below: Future Appointments  Date Time Provider Department Center  05/28/2023  9:10 AM Kyoko Elsea, Donell Redgie, MD [id number] None

## 2023-12-04 IMAGING — DX DG CHEST 1V PORT
1 series · 1 of 1 positions shown · non-contrast
Comparison: Radiograph 09/23/2018

CLINICAL DATA: Chest pain

EXAM:
PORTABLE CHEST 1 VIEW

[chest ap]
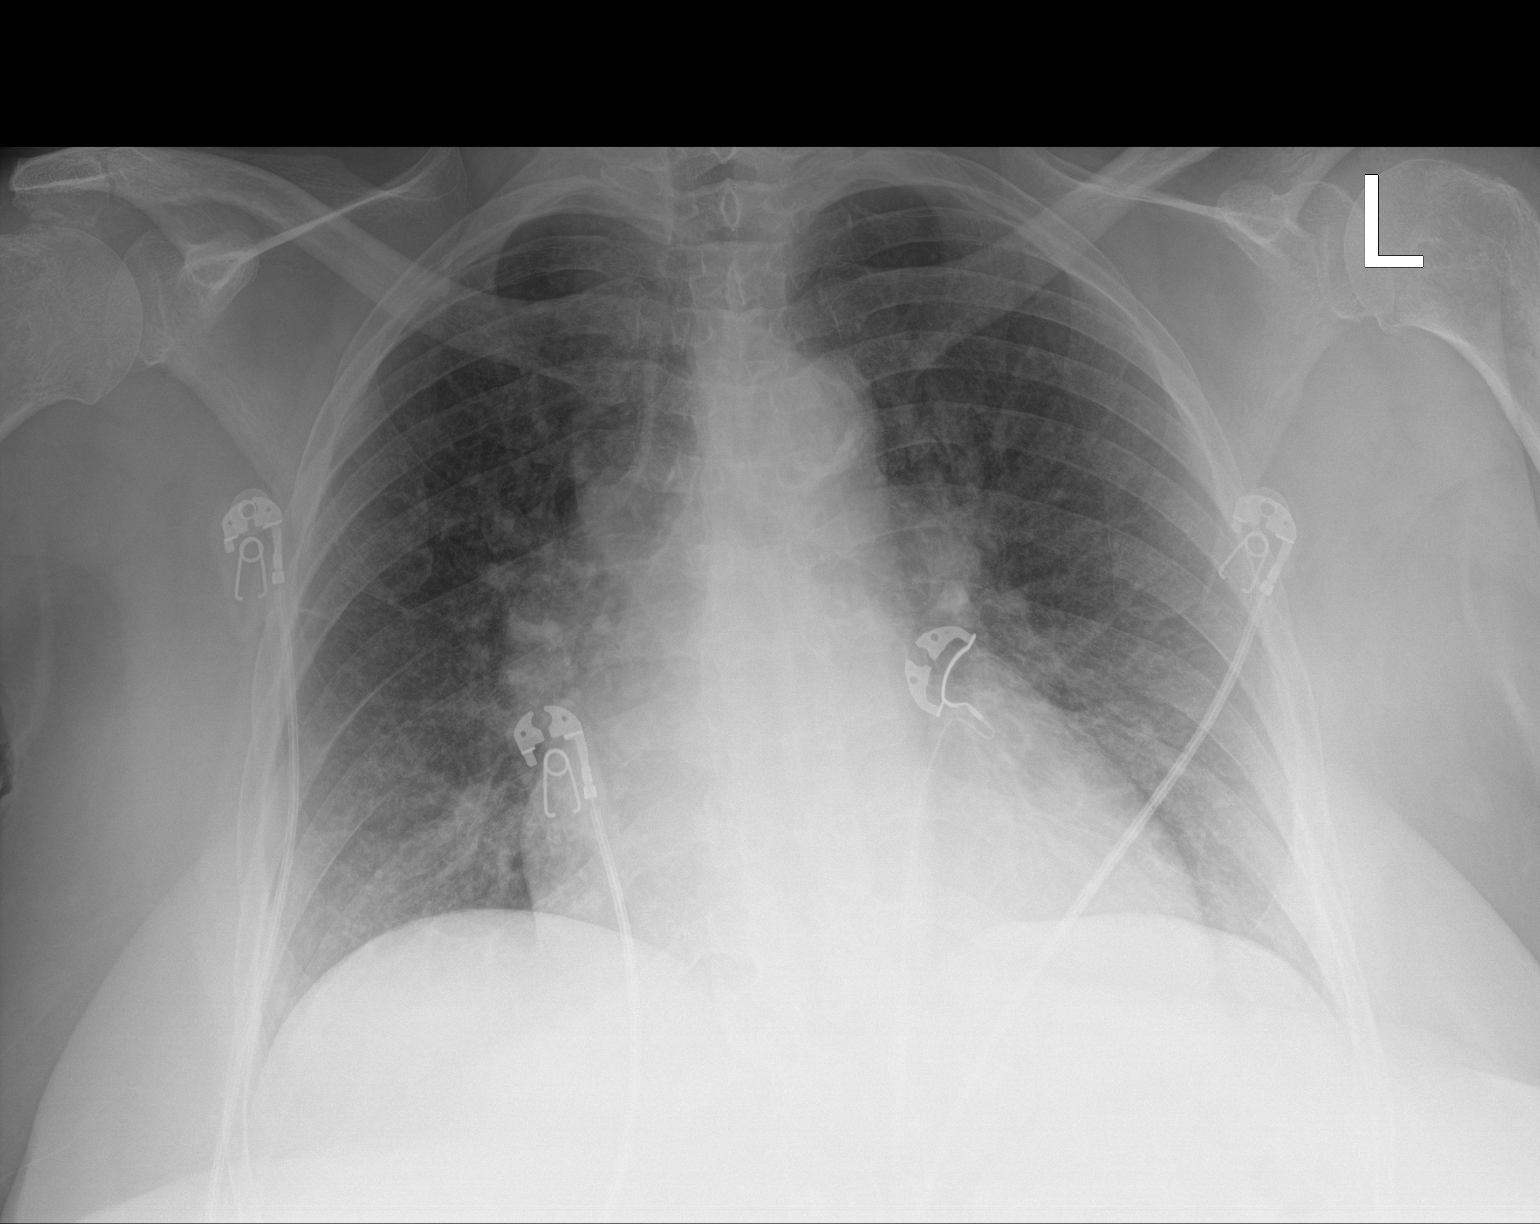

[1 of 1 positions shown; findings below may reference images not displayed]

FINDINGS: Unchanged cardiomediastinal silhouette. There is no focal airspace
consolidation. There is no pleural effusion. No pneumothorax.
Bilateral shoulder degenerative changes. There is no acute osseous
abnormality.
IMPRESSION: No evidence of acute cardiopulmonary disease.

## 2023-12-08 ENCOUNTER — Ambulatory Visit: Payer: Self-pay | Admitting: Cardiology

## 2023-12-17 ENCOUNTER — Other Ambulatory Visit: Payer: Self-pay | Admitting: Internal Medicine

## 2023-12-17 ENCOUNTER — Telehealth: Payer: Self-pay

## 2023-12-17 DIAGNOSIS — Z794 Long term (current) use of insulin: Secondary | ICD-10-CM

## 2023-12-17 NOTE — Telephone Encounter (Signed)
 Can we contact patient to schedule follow up.

## 2023-12-21 ENCOUNTER — Encounter: Payer: Self-pay | Admitting: Internal Medicine

## 2023-12-21 ENCOUNTER — Ambulatory Visit (INDEPENDENT_AMBULATORY_CARE_PROVIDER_SITE_OTHER): Admitting: Internal Medicine

## 2023-12-21 VITALS — BP 104/68 | HR 94 | Ht 62.0 in | Wt 209.0 lb

## 2023-12-21 DIAGNOSIS — D352 Benign neoplasm of pituitary gland: Secondary | ICD-10-CM

## 2023-12-21 DIAGNOSIS — E1142 Type 2 diabetes mellitus with diabetic polyneuropathy: Secondary | ICD-10-CM | POA: Diagnosis not present

## 2023-12-21 DIAGNOSIS — E1159 Type 2 diabetes mellitus with other circulatory complications: Secondary | ICD-10-CM | POA: Diagnosis not present

## 2023-12-21 DIAGNOSIS — E1122 Type 2 diabetes mellitus with diabetic chronic kidney disease: Secondary | ICD-10-CM | POA: Diagnosis not present

## 2023-12-21 DIAGNOSIS — E782 Mixed hyperlipidemia: Secondary | ICD-10-CM | POA: Diagnosis not present

## 2023-12-21 DIAGNOSIS — Z794 Long term (current) use of insulin: Secondary | ICD-10-CM | POA: Diagnosis not present

## 2023-12-21 DIAGNOSIS — N1831 Chronic kidney disease, stage 3a: Secondary | ICD-10-CM

## 2023-12-21 LAB — POCT GLYCOSYLATED HEMOGLOBIN (HGB A1C): Hemoglobin A1C: 6 % — AB (ref 4.0–5.6)

## 2023-12-21 MED ORDER — INSULIN PEN NEEDLE 32G X 4 MM MISC: 1.0000 | Freq: Two times a day (BID) | 3 refills | Status: AC

## 2023-12-21 MED ORDER — INSULIN LISPRO PROT & LISPRO (75-25 MIX) 100 UNIT/ML KWIKPEN: 12.0000 [IU] | PEN_INJECTOR | Freq: Two times a day (BID) | SUBCUTANEOUS | 3 refills | Status: DC

## 2023-12-21 MED ORDER — DAPAGLIFLOZIN PROPANEDIOL 10 MG PO TABS: 10.0000 mg | ORAL_TABLET | Freq: Every day | ORAL | 3 refills | Status: AC

## 2023-12-21 MED ORDER — TIRZEPATIDE 10 MG/0.5ML ~~LOC~~ SOAJ
10.0000 mg | SUBCUTANEOUS | 3 refills | Status: AC
Start: 2023-12-21 — End: ?

## 2023-12-21 NOTE — Progress Notes (Unsigned)
 Name: Christina Solis  Age/ Sex: 73 y.o., female   MRN/ DOB: [id number], 03-10-1951     PCP: Joshua Santana CROME, NP   Reason for Endocrinology Evaluation: Type 2 Diabetes Mellitus  Initial Endocrine Consultative Visit:  05/03/2019    PATIENT IDENTIFIER: [name] is a 73 y.o. female with a past medical history of HTN, COPD, and T2DM. The patient has followed with Endocrinology clinic since 05/03/2019 for consultative assistance with management of her diabetes.  DIABETIC HISTORY:  Christina Solis was diagnosed with T2 DM in 2008 .She was on metformin that was stopped due to CKD, has been on insulin  since 2009. Her hemoglobin A1c has ranged from 6.9% in 2015, peaking at 7.4% in 2020.  On her initial visit to our clinic she had an A1c of 7.4%, she was on Humalog  mix, which was continued and adjusted.   Started Ozempic  12/2021  Switch Ozempic  to Mounjaro  05/2022 due to insurance coverage  PITUITARY TUMOR :  She was first diagnosed with pituitary adenoma in 2006 . She was put on medication for ~ 8 month but when her level normalized ,the medicine was stopped. She had galactorrhea up to 2018.  Repeat prolactin in 2022 was normal with appropriately elevated FSH and normal IGF  Prolactin was elevated in 2023   She is a retired Engineer, civil (consulting) Lives with daughter SUBJECTIVE:   During the last visit (11/25/2022): A1c 6.3%      Today (12/21/2023): Christina Solis is here for follow-up on diabetes management. She checks her blood sugars 2x daliy.  The patient has not had hypoglycemic episodes since the last clinic visit.    She has NOT been to our clinic in 13 months.   Patient [facility] [place] Denies nausea or vomiting  Has chronic constipation due to pain meds    HOME DIABETES REGIMEN:  Humalog  mix 16 units BID Farxiga  10 mg daily  Mounjaro  7.5 mg weekly Rosuvastatin  20 mg daily   Statin: Yes ACE-I/ARB: Yes    METER DOWNLOAD SUMMARY:  Date range evaluated: 7/7-12/21/2023 Average Number Tests = 41 Overall Mean FS Glucose = 134   BG Ranges Low = 52 High = 285   Hypoglycemic Events/30 Days: BG < 50 = 0 Episodes of symptomatic severe hypoglycemia = 0    DIABETIC COMPLICATIONS: Microvascular complications:  Neuropathy, CKD III, right eye cataract  Denies: retinopathy Last eye exam: 11/2023   Macrovascular complications:  CHF, PVD Denies: CAD, CVA    HISTORY:  Past Medical History:  Past Medical History:  Diagnosis Date   Anemia    sickle cell trait   Anxiety    Arthritis    knees (10/12/2013)   CHF (congestive heart failure) (HCC)    Chronic back pain    Chronic bronchitis (HCC)    got it q yr when I lived in [place]   Complication of anesthesia    hard to put under   COPD (chronic obstructive pulmonary disease) (HCC)    Dysrhythmia    Family history of anesthesia complication    sister hard to wake up   Family history of anesthesia complication    niece have itching   Fibromyalgia    GERD (gastroesophageal reflux disease)    Hyperlipidemia    Hypertension    Irregular heart beat    Migraine    used to get them alot; not regular anymore (10/12/2013)   PAD (peripheral artery disease) (HCC)  Pituitary tumor    followed at [facility]; on cabergoline  09/2013   Pneumonia    twice   Type II diabetes mellitus [facility])    Past Surgical History:  Past Surgical History:  Procedure Laterality Date   ABDOMINAL AORTAGRAM N/A 06/28/2012   Procedure: ABDOMINAL EZELLA;  Surgeon: Erick JONELLE Bergamo, MD;  Location: [facility] CATH LAB;  Service: Cardiovascular;  Laterality: N/A;   APPENDECTOMY     BACK SURGERY     x 2, lower back   BILATERAL CARPAL TUNNEL RELEASE     CARDIAC CATHETERIZATION     CATARACT EXTRACTION Left 2023   CATARACT EXTRACTION Right 2023   CESAREAN SECTION  1974; 1976; 1978   CHOLECYSTECTOMY N/A 10/12/2013   Procedure: LAPAROSCOPIC CHOLECYSTECTOMY WITH INTRAOPERATIVE  CHOLANGIOGRAM;  Surgeon: Donnice POUR. Belinda, MD;  Location: [facility] OR;  Service: General;  Laterality: N/A;   COLONOSCOPY     FEMORAL ARTERY STENT Left 06/28/2012   SFA   LAPAROSCOPIC CHOLECYSTECTOMY  10/12/2013   LEFT HEART CATHETERIZATION WITH CORONARY ANGIOGRAM N/A 06/28/2012   Procedure: LEFT HEART CATHETERIZATION WITH CORONARY ANGIOGRAM;  Surgeon: Erick JONELLE Bergamo, MD;  Location: [facility] CATH LAB;  Service: Cardiovascular;  Laterality: N/A;   LOWER EXTREMITY ANGIOGRAM N/A 06/28/2012   Procedure: LOWER EXTREMITY ANGIOGRAM;  Surgeon: Erick JONELLE Bergamo, MD;  Location: [facility] CATH LAB;  Service: Cardiovascular;  Laterality: N/A;   PERIPHERAL VASCULAR CATHETERIZATION N/A 09/10/2015   Procedure: Abdominal Aortogram w/Lower Extremity;  Surgeon: Gordy Bergamo, MD;  Location: [facility] INVASIVE CV LAB;  Service: Cardiovascular;  Laterality: N/A;   POSTERIOR LUMBAR FUSION     REPLACEMENT TOTAL KNEE Left    TONSILLECTOMY     TUBAL LIGATION  1978   Social History:  reports that she quit smoking about 8 years ago. Her smoking use included cigarettes. She started smoking about 23 years ago. She has a 23 pack-year smoking history. She has never used smokeless tobacco. She reports that she does not drink alcohol and does not use drugs. Family History:  Family History  Problem Relation Age of Onset   Diabetes Mother    Kidney disease Mother    Cancer Father    Pancreatic cancer Father    Kidney disease Brother    Cancer Brother    Colon cancer Neg Hx    Inflammatory bowel disease Neg Hx    Esophageal cancer Neg Hx    Liver disease Neg Hx    Rectal cancer Neg Hx    Stomach cancer Neg Hx      HOME MEDICATIONS: Allergies as of 12/21/2023       Reactions   Coconut (cocos Nucifera) Hives        Medication List        Accurate as of December 21, 2023  1:16 PM. If you have any questions, ask your nurse or doctor.          albuterol  108 (90 Base) MCG/ACT inhaler Commonly known as: VENTOLIN  HFA Inhale 1 puff  into the lungs every 6 (six) hours as needed for wheezing or shortness of breath. ProAir    albuterol  (2.5 MG/3ML) 0.083% nebulizer solution Commonly known as: PROVENTIL  Take 2.5 mg by nebulization every 6 (six) hours as needed for wheezing or shortness of breath.   budesonide -formoterol  160-4.5 MCG/ACT inhaler Commonly known as: SYMBICORT  Inhale 2 puffs into the lungs 2 (two) times daily.   cetirizine -pseudoephedrine  5-120 MG tablet Commonly known as: ZYRTEC -D Take 1 tablet by mouth daily.   clopidogrel  75 MG tablet  Commonly known as: PLAVIX  Take 1 tablet (75 mg total) by mouth daily.   dapagliflozin  propanediol 10 MG Tabs tablet Commonly known as: FARXIGA  TAKE 1 TABLET(10 MG) BY MOUTH DAILY BEFORE BREAKFAST   diclofenac Sodium 1 % Gel Commonly known as: VOLTAREN Apply topically.   dicyclomine  20 MG tablet Commonly known as: BENTYL  Take 20 mg by mouth 4 (four) times daily as needed for spasms.   ferrous sulfate  325 (65 FE) MG tablet Take 325 mg by mouth daily with breakfast.   HumaLOG  Mix 75/25 KwikPen (75-25) 100 UNIT/ML KwikPen Generic drug: Insulin  Lispro Prot & Lispro Inject 16 Units into the skin in the morning and at bedtime.   icosapent  Ethyl 1 g capsule Commonly known as: VASCEPA  Take 2 capsules (2 g total) by mouth 2 (two) times daily.   Insulin  Pen Needle 32G X 4 MM Misc 1 Device by Does not apply route in the morning and at bedtime.   lisinopril -hydrochlorothiazide  20-12.5 MG tablet Commonly known as: ZESTORETIC  Take 1 tablet by mouth daily.   lubiprostone 24 MCG capsule Commonly known as: AMITIZA Take 24 mcg by mouth 2 (two) times daily with a meal.   mometasone  50 MCG/ACT nasal spray Commonly known as: NASONEX  Place 2 sprays into the nose as needed.   Mounjaro  7.5 MG/0.5ML Pen Generic drug: tirzepatide  ADMINISTER 7.5 MG UNDER THE SKIN 1 TIME A WEEK   multivitamin capsule Take 1 capsule by mouth daily.   naproxen 250 MG tablet Commonly known  as: NAPROSYN Take 250 mg by mouth 2 (two) times daily.   Olopatadine  HCl 0.2 % Soln Apply 1 drop to eye daily. What changed:  when to take this reasons to take this   oxyCODONE -acetaminophen  10-325 MG tablet Commonly known as: PERCOCET Take 1 tablet by mouth every 4 (four) hours as needed.   pregabalin  200 MG capsule Commonly known as: LYRICA  Take 200 mg by mouth 3 (three) times daily.   promethazine  25 MG tablet Commonly known as: PHENERGAN  Take 25 mg by mouth every 6 (six) hours as needed for nausea or vomiting.   rosuvastatin  40 MG tablet Commonly known as: CRESTOR  Take 1 tablet (40 mg total) by mouth daily.   Spiriva  Respimat 2.5 MCG/ACT Aers Generic drug: Tiotropium Bromide Monohydrate  INHALE 2 PUFFS INTO THE LUNGS DAILY   temazepam 7.5 MG capsule Commonly known as: RESTORIL Take 7.5 mg by mouth daily.   tiZANidine  4 MG tablet Commonly known as: ZANAFLEX  Take 4 mg by mouth 3 (three) times daily as needed.   Vitamin D (Ergocalciferol) 1.25 MG (50000 UNIT) Caps capsule Commonly known as: DRISDOL Take 50,000 Units by mouth every Tuesday.         OBJECTIVE:   Vital Signs: BP 104/68 (BP Location: Left Arm, Patient Position: Sitting, Cuff Size: Normal)   Pulse 94   Ht 5' 2 (1.575 m)   Wt 209 lb (94.8 kg)   SpO2 95%   BMI 38.23 kg/m   Wt Readings from Last 3 Encounters:  12/21/23 209 lb (94.8 kg)  12/07/22 209 lb (94.8 kg)  11/25/22 211 lb (95.7 kg)     Exam: General: Pt appears well and is in NAD  Lungs: Clear with good BS bilat   Heart: RRR   Extremities: No pretibial edema.   Neuro: MS is good with appropriate affect, pt is alert and Ox3    DM foot exam: 12/21/2023   The skin of the feet is intact without sores or ulcerations. The pedal pulses are  1+ on right and 1+ on left. The sensation is intact to a screening 5.07, 10 gram monofilament bilaterally    DATA REVIEWED:  Lab Results  Component Value Date   HGBA1C 6.3 (A) 11/25/2022    HGBA1C 6.3 (A) 05/22/2022   HGBA1C 7.6 (A) 12/30/2021     Labs @ Care Everywhere/20 06/2023  Sodium 142 Potassium 4.8 BUN 26 Creatinine 1.25 GFR 46    ASSESSMENT / PLAN / RECOMMENDATIONS:   1) Type 2 Diabetes Mellitus, Optimally controlled, With neuropathic, CKD III and macrovascular complications - Most recent A1c of 6.0 %. Goal A1c < 7.0 %.    - A1c is optimal - Patient has been noted with hypoglycemia, will decrease insulin  as below, emphasized the importance of taking insulin  before the meal - I will increase Mounjaro   MEDICATIONS: -Decrease Humalog  Mix  12 units with Breakfast and 12 units with supper -Continue Farxiga  10 mg  Daily  -Increase Mounjaro  10 mg weekly  EDUCATION / INSTRUCTIONS: BG monitoring instructions: Patient is instructed to check her blood sugars 2 times a day, fasting and dinner. Call [facility] if: BG persistently < 70  I reviewed the Rule of 15 for the treatment of hypoglycemia in detail with the patient. Literature supplied.   2) Diabetic complications:  Eye: Does not have known diabetic retinopathy.  Neuro/ Feet: Does have known diabetic peripheral neuropathy .  Renal: Patient does have known baseline CKD. She   is on an ACEI/ARB at present.     3) Dyslipidemia:  - Lipid panel****  Medications Continue rosuvastatin  20 mg daily  Continue Vascepa  2 g twice daily   4) Pituitary Microadenoma :  - Per pt, she has been diagnosed with pituitary adenoma in 2006. No prior sx . 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 - Pituitary hormone check was normal 06/2020  -She has slight elevation of prolactin in February 2023 but repeat testing 12/2021 shows normal prolactin and TFTs - MRI showed 4x 2 mm pituitary adenoma (06/2020) - Asymptomatic - Prolactin and TFTs***    F/U in 6 months    Signed electronically by: Stefano Redgie Butts,  MD  [facility] Endocrinology  [facility] [address] [place], [place] [address] Phone: [phone] FAX: [phone]   CC: Joshua Santana CROME, NP [address] [place] [address] Phone: [phone]  Fax: [phone]  Return to Endocrinology clinic as below: Future Appointments  Date Time Provider Department Center  12/21/2023  1:40 PM Jovian Solis, Donell Redgie, MD [id number] None

## 2023-12-21 NOTE — Patient Instructions (Signed)
-   Increase Mounjaro  10 mg once weekly - Decrease Humalog  Mix  12 units with Breakfast and 12 units with supper - Continue Farxiga  10 mg, 1 tablet every morning    HOW TO TREAT LOW BLOOD SUGARS (Blood sugar LESS THAN 70 MG/DL) Please follow the RULE OF 15 for the treatment of hypoglycemia treatment (when your (blood sugars are less than 70 mg/dL)   STEP 1: Take 15 grams of carbohydrates when your blood sugar is low, which includes:  3-4 GLUCOSE TABS  OR 3-4 OZ OF JUICE OR REGULAR SODA OR ONE TUBE OF GLUCOSE GEL    STEP 2: RECHECK blood sugar in 15 MINUTES STEP 3: If your blood sugar is still low at the 15 minute recheck --> then, go back to STEP 1 and treat AGAIN with another 15 grams of carbohydrates.

## 2023-12-22 ENCOUNTER — Ambulatory Visit: Payer: Self-pay | Admitting: Internal Medicine

## 2023-12-22 LAB — LIPID PANEL
Cholesterol: 119 mg/dL (ref <200)
HDL: 34 mg/dL — ABNORMAL LOW (ref >=50)
LDL Cholesterol (Calc): 60 mg/dL
Non-HDL Cholesterol (Calc): 85 mg/dL (ref <130)
Total CHOL/HDL Ratio: 3.5 (calc) (ref <5.0)
Triglycerides: 178 mg/dL — ABNORMAL HIGH (ref <150)

## 2023-12-22 LAB — MICROALBUMIN / CREATININE URINE RATIO
Creatinine, Urine: 195 mg/dL (ref 20–275)
Microalb Creat Ratio: 1 mg/g{creat} (ref <30)
Microalb, Ur: 0.2 mg/dL

## 2023-12-22 LAB — BASIC METABOLIC PANEL WITH GFR
BUN/Creatinine Ratio: 10 (calc) (ref 6–22)
BUN: 13 mg/dL (ref 7–25)
CO2: 28 mmol/L (ref 20–32)
Calcium: 8.9 mg/dL (ref 8.6–10.4)
Chloride: 103 mmol/L (ref 98–110)
Creat: 1.27 mg/dL — ABNORMAL HIGH (ref 0.60–1.00)
Glucose, Bld: 78 mg/dL (ref 65–99)
Potassium: 4.6 mmol/L (ref 3.5–5.3)
Sodium: 140 mmol/L (ref 135–146)
eGFR: 45 mL/min/1.73m2 — ABNORMAL LOW (ref >=60)

## 2023-12-22 LAB — PROLACTIN: Prolactin: 63.2 ng/mL — ABNORMAL HIGH

## 2023-12-22 LAB — TSH: TSH: 3.01 m[IU]/L (ref 0.40–4.50)

## 2023-12-22 LAB — T4, FREE: Free T4: 0.8 ng/dL (ref 0.8–1.8)

## 2023-12-22 MED ORDER — CABERGOLINE 0.5 MG PO TABS: 0.2500 mg | ORAL_TABLET | ORAL | 3 refills | Status: AC

## 2023-12-22 NOTE — Telephone Encounter (Signed)
 Please let the patient know that her prolactin level is high again, I would suggest we start her on cabergoline  at half a tablet twice a week (for example every Sunday and Wednesday)    Kidney function is stable  Bad cholesterol looks great Urine test is normal   Thanks

## 2024-01-06 ENCOUNTER — Other Ambulatory Visit: Payer: Self-pay | Admitting: Cardiology

## 2024-01-07 ENCOUNTER — Other Ambulatory Visit: Payer: Self-pay | Admitting: Cardiology

## 2024-01-10 ENCOUNTER — Other Ambulatory Visit: Payer: Self-pay

## 2024-01-10 DIAGNOSIS — E1142 Type 2 diabetes mellitus with diabetic polyneuropathy: Secondary | ICD-10-CM

## 2024-01-10 MED ORDER — ROSUVASTATIN CALCIUM 40 MG PO TABS: 40.0000 mg | ORAL_TABLET | Freq: Every day | ORAL | 1 refills | Status: AC

## 2024-01-10 NOTE — Telephone Encounter (Signed)
 Prescription sent per fax request from [facility]

## 2024-02-10 NOTE — Progress Notes (Signed)
 Red ruSpoke with the Patient today. Red Rule verified YES. The purpose of the call was to review and discuss upcoming appointments and health maintenance screenings due within the next year.  Interpreter Services: Assistance used during this phone call. No   Health Maintenance  Topic Date Due  . Colorectal Cancer Screening  Never done  . Mammogram  Never done  . Diabetes Lipid Profile  Never done  . Diabetes Foot Exam  Never done  . Zoster Vaccine (1 of 2) Never done  . DTaP/Tdap/Td Vaccines (2 - Td or Tdap) 05/18/2010  . DEXA Scan  Never done  . Diabetes Hemoglobin A1C  08/01/2019  . Diabetes Eye Exam  01/17/2020  . Diabetes GFR/EGFR  02/01/2020  . Diabetes Annual Microalbumin/Creatinine Ratio  02/01/2020  . Creatinine Level  05/02/2020  . Potassium Level  05/02/2020  . BUN  05/02/2020  . Pneumococcal Vaccine: 50+ Years (3 of 3 - PCV20 or PCV21) 12/29/2022  . Medicare Annual Wellness  08/11/2023  . Influenza Vaccine (1) 01/17/2024  . RSV Adult and Pregnancy (1 - 1-dose 75+ series) 05/09/2027  . Meningococcal B Vaccine  Aged Out  . Hepatitis A Vaccine  Aged Out  . Meningococcal Conjugate Vaccine  Aged Out       Health Maintenance Reviewed: MEDICARE ANNUAL WELLNESS VISIT SCREENING MAMMOGRAM  Appointments Scheduled: No visit scheduled  Referrals Placed During This Encounter: N/A  SDOH / Community Referrals Placed This Encounter: N/A  Comments: per patient PCP @ [facility] Santana Molt. AWV March 2025 & Mammo Sept 2025 & Bone Density Sept 2025   ROI: NA  Care Coordination Note: This chart has been reviewed by a [facility] Specialist to identify and address outstanding health maintenance needs, with the goal of closing quality care gaps and supporting preventive care efforts.

## 2024-03-01 ENCOUNTER — Telehealth: Payer: Self-pay

## 2024-03-01 NOTE — Telephone Encounter (Signed)
 Contact patient for appointment per her request

## 2024-03-03 ENCOUNTER — Encounter: Payer: Self-pay | Admitting: Internal Medicine

## 2024-03-03 ENCOUNTER — Other Ambulatory Visit

## 2024-03-03 ENCOUNTER — Ambulatory Visit (INDEPENDENT_AMBULATORY_CARE_PROVIDER_SITE_OTHER): Admitting: Internal Medicine

## 2024-03-03 VITALS — BP 120/72 | HR 76 | Ht 62.0 in | Wt 197.0 lb

## 2024-03-03 DIAGNOSIS — E1159 Type 2 diabetes mellitus with other circulatory complications: Secondary | ICD-10-CM

## 2024-03-03 DIAGNOSIS — Z794 Long term (current) use of insulin: Secondary | ICD-10-CM | POA: Diagnosis not present

## 2024-03-03 DIAGNOSIS — D352 Benign neoplasm of pituitary gland: Secondary | ICD-10-CM

## 2024-03-03 DIAGNOSIS — E1142 Type 2 diabetes mellitus with diabetic polyneuropathy: Secondary | ICD-10-CM | POA: Diagnosis not present

## 2024-03-03 DIAGNOSIS — N1831 Chronic kidney disease, stage 3a: Secondary | ICD-10-CM

## 2024-03-03 DIAGNOSIS — E1122 Type 2 diabetes mellitus with diabetic chronic kidney disease: Secondary | ICD-10-CM

## 2024-03-03 DIAGNOSIS — E782 Mixed hyperlipidemia: Secondary | ICD-10-CM

## 2024-03-03 LAB — POCT GLUCOSE (DEVICE FOR HOME USE): Glucose Fasting, POC: 87 mg/dL (ref 70–99)

## 2024-03-03 LAB — POCT GLYCOSYLATED HEMOGLOBIN (HGB A1C): Hemoglobin A1C: 5.5 % (ref 4.0–5.6)

## 2024-03-03 LAB — PROLACTIN: Prolactin: 9.4 ng/mL

## 2024-03-03 MED ORDER — INSULIN LISPRO PROT & LISPRO (75-25 MIX) 100 UNIT/ML KWIKPEN: 8.0000 [IU] | PEN_INJECTOR | Freq: Two times a day (BID) | SUBCUTANEOUS | Status: AC

## 2024-03-03 NOTE — Patient Instructions (Signed)
-   Continue  Mounjaro  10 mg once weekly - Decrease Humalog  Mix  8 units with Breakfast and 8 units with supper - Continue Farxiga  10 mg, 1 tablet every morning    HOW TO TREAT LOW BLOOD SUGARS (Blood sugar LESS THAN 70 MG/DL) Please follow the RULE OF 15 for the treatment of hypoglycemia treatment (when your (blood sugars are less than 70 mg/dL)   STEP 1: Take 15 grams of carbohydrates when your blood sugar is low, which includes:  3-4 GLUCOSE TABS  OR 3-4 OZ OF JUICE OR REGULAR SODA OR ONE TUBE OF GLUCOSE GEL    STEP 2: RECHECK blood sugar in 15 MINUTES STEP 3: If your blood sugar is still low at the 15 minute recheck --> then, go back to STEP 1 and treat AGAIN with another 15 grams of carbohydrates.

## 2024-03-03 NOTE — Progress Notes (Unsigned)
 Name: Christina Solis  Age/ Sex: 73 y.o., female   MRN/ DOB: [id number], [birth year]     PCP: Christina Santana CROME, NP   Reason for Endocrinology Evaluation: Type 2 Diabetes Mellitus  Initial Endocrine Consultative Visit:  05/03/2019    PATIENT IDENTIFIER: [name] is a 73 y.o. female with a past medical history of HTN, COPD, and T2DM. The patient has followed with Endocrinology clinic since 05/03/2019 for consultative assistance with management of her diabetes.  DIABETIC HISTORY:  Christina Solis was diagnosed with T2 DM in 2008 .She was on metformin that was stopped due to CKD, has been on insulin  since 2009. Her hemoglobin A1c has ranged from 6.9% in 2015, peaking at 7.4% in 2020.  On her initial visit to our clinic she had an A1c of 7.4%, she was on Humalog  mix, which was continued and adjusted.   Started Ozempic  12/2021  Switch Ozempic  to Mounjaro  05/2022 due to insurance coverage  PITUITARY TUMOR :  She was first diagnosed with pituitary adenoma in 2006 . She was put on medication for ~ 8 month but when her level normalized ,the medicine was stopped. She had galactorrhea up to 2018.  Repeat prolactin in 2022 was normal with appropriately elevated FSH and normal IGF  Prolactin was elevated in 2023  Patient was restarted on cabergoline  in August, 2025 with elevated prolactin at 63.2 NG/mL    She is a retired Engineer, civil (consulting) Lives with daughter SUBJECTIVE:   During the last visit (12/21/2023): A1c 6.3%      Today (03/03/2024): Christina Solis is here for follow-up on diabetes management. She checks her blood sugars 3x daliy.  The patient has  had hypoglycemic episodes since the last clinic visit. She is symptomatic   The patient establish care with [facility], for COPD and multiple lung nodules follow-up  Patient continues [facility] [place] No nausea or vomiting  Has chronic constipation due to pain meds  No nipple  discharge unless with expression Has vaginal bleed last week, saw gynecology yesterday, pending ultrasound  The patient does endorse new onset sharp headaches  HOME ENDOCRINE REGIMEN:  Humalog  mix 12 units BID Farxiga  10 mg daily  Mounjaro  10 mg weekly Rosuvastatin  40 mg daily Cabergoline  0.5 mg, half a tablet twice weekly ( Mondays and Fridays)    Statin: Yes ACE-I/ARB: Yes    METER DOWNLOAD SUMMARY: n/a    DIABETIC COMPLICATIONS: Microvascular complications:  Neuropathy, CKD III, right eye cataract  Denies: retinopathy Last eye exam: 11/2023   Macrovascular complications:  CHF, PVD Denies: CAD, CVA    HISTORY:  Past Medical History:  Past Medical History:  Diagnosis Date   Anemia    sickle cell trait   Anxiety    Arthritis    knees (10/12/2013)   CHF (congestive heart failure) (HCC)    Chronic back pain    Chronic bronchitis (HCC)    got it q yr when I lived in [place]   Complication of anesthesia    hard to put under   COPD (chronic obstructive pulmonary disease) (HCC)    Dysrhythmia    Family history of anesthesia complication    sister hard to wake up   Family history of anesthesia complication    niece have itching   Fibromyalgia    GERD (gastroesophageal reflux disease)    Hyperlipidemia    Hypertension    Irregular heart beat    Migraine  used to get them alot; not regular anymore (10/12/2013)   PAD (peripheral artery disease)    Pituitary tumor    followed at [facility]; on cabergoline  09/2013   Pneumonia    twice   Type II diabetes mellitus [facility])    Past Surgical History:  Past Surgical History:  Procedure Laterality Date   ABDOMINAL AORTAGRAM N/A 06/28/2012   Procedure: ABDOMINAL EZELLA;  Surgeon: Erick JONELLE Bergamo, MD;  Location: [facility] CATH LAB;  Service: Cardiovascular;  Laterality: N/A;   APPENDECTOMY     BACK SURGERY     x 2, lower back   BILATERAL CARPAL TUNNEL RELEASE     CARDIAC CATHETERIZATION      CATARACT EXTRACTION Left 2023   CATARACT EXTRACTION Right 2023   CESAREAN SECTION  1974; 1976; 1978   CHOLECYSTECTOMY N/A 10/12/2013   Procedure: LAPAROSCOPIC CHOLECYSTECTOMY WITH INTRAOPERATIVE CHOLANGIOGRAM;  Surgeon: Donnice POUR. Belinda, MD;  Location: [facility] OR;  Service: General;  Laterality: N/A;   COLONOSCOPY     FEMORAL ARTERY STENT Left 06/28/2012   SFA   LAPAROSCOPIC CHOLECYSTECTOMY  10/12/2013   LEFT HEART CATHETERIZATION WITH CORONARY ANGIOGRAM N/A 06/28/2012   Procedure: LEFT HEART CATHETERIZATION WITH CORONARY ANGIOGRAM;  Surgeon: Erick JONELLE Bergamo, MD;  Location: [facility] CATH LAB;  Service: Cardiovascular;  Laterality: N/A;   LOWER EXTREMITY ANGIOGRAM N/A 06/28/2012   Procedure: LOWER EXTREMITY ANGIOGRAM;  Surgeon: Erick JONELLE Bergamo, MD;  Location: [facility] CATH LAB;  Service: Cardiovascular;  Laterality: N/A;   PERIPHERAL VASCULAR CATHETERIZATION N/A 09/10/2015   Procedure: Abdominal Aortogram w/Lower Extremity;  Surgeon: Gordy Bergamo, MD;  Location: [facility] INVASIVE CV LAB;  Service: Cardiovascular;  Laterality: N/A;   POSTERIOR LUMBAR FUSION     REPLACEMENT TOTAL KNEE Left    TONSILLECTOMY     TUBAL LIGATION  1978   Social History:  reports that she quit smoking about 8 years ago. Her smoking use included cigarettes. She started smoking about 23 years ago. She has a 23 pack-year smoking history. She has never used smokeless tobacco. She reports that she does not drink alcohol and does not use drugs. Family History:  Family History  Problem Relation Age of Onset   Diabetes Mother    Kidney disease Mother    Cancer Father    Pancreatic cancer Father    Kidney disease Brother    Cancer Brother    Colon cancer Neg Hx    Inflammatory bowel disease Neg Hx    Esophageal cancer Neg Hx    Liver disease Neg Hx    Rectal cancer Neg Hx    Stomach cancer Neg Hx      HOME MEDICATIONS: Allergies as of 03/03/2024       Reactions   Coconut (cocos Nucifera) Hives        Medication List         Accurate as of March 03, 2024 12:01 PM. If you have any questions, ask your nurse or doctor.          albuterol  108 (90 Base) MCG/ACT inhaler Commonly known as: VENTOLIN  HFA Inhale 1 puff into the lungs every 6 (six) hours as needed for wheezing or shortness of breath. ProAir    albuterol  (2.5 MG/3ML) 0.083% nebulizer solution Commonly known as: PROVENTIL  Take 2.5 mg by nebulization every 6 (six) hours as needed for wheezing or shortness of breath.   budesonide -formoterol  160-4.5 MCG/ACT inhaler Commonly known as: SYMBICORT  Inhale 2 puffs into the lungs 2 (two) times daily.   cabergoline  0.5 MG  tablet Commonly known as: DOSTINEX  Take 0.5 tablets (0.25 mg total) by mouth 2 (two) times a week.   cetirizine -pseudoephedrine  5-120 MG tablet Commonly known as: ZYRTEC -D Take 1 tablet by mouth daily.   clopidogrel  75 MG tablet Commonly known as: PLAVIX  TAKE 1 TABLET(75 MG) BY MOUTH DAILY   dapagliflozin  propanediol 10 MG Tabs tablet Commonly known as: FARXIGA  Take 1 tablet (10 mg total) by mouth daily.   diclofenac Sodium 1 % Gel Commonly known as: VOLTAREN Apply topically.   dicyclomine  20 MG tablet Commonly known as: BENTYL  Take 20 mg by mouth 4 (four) times daily as needed for spasms.   ferrous sulfate  325 (65 FE) MG tablet Take 325 mg by mouth daily with breakfast.   icosapent  Ethyl 1 g capsule Commonly known as: VASCEPA  Take 2 capsules (2 g total) by mouth 2 (two) times daily.   Insulin  Lispro Prot & Lispro (75-25) 100 UNIT/ML Kwikpen Commonly known as: HumaLOG  Mix 75/25 KwikPen Inject 12 Units into the skin in the morning and at bedtime.   Insulin  Pen Needle 32G X 4 MM Misc 1 Device by Does not apply route in the morning and at bedtime.   lisinopril -hydrochlorothiazide  20-12.5 MG tablet Commonly known as: ZESTORETIC  Take 1 tablet by mouth daily.   lubiprostone 24 MCG capsule Commonly known as: AMITIZA Take 24 mcg by mouth 2 (two) times daily with a  meal.   mometasone  50 MCG/ACT nasal spray Commonly known as: NASONEX  Place 2 sprays into the nose as needed.   multivitamin capsule Take 1 capsule by mouth daily.   naproxen 250 MG tablet Commonly known as: NAPROSYN Take 250 mg by mouth 2 (two) times daily.   Olopatadine  HCl 0.2 % Soln Apply 1 drop to eye daily. What changed:  when to take this reasons to take this   oxyCODONE -acetaminophen  10-325 MG tablet Commonly known as: PERCOCET Take 1 tablet by mouth every 4 (four) hours as needed.   pregabalin  200 MG capsule Commonly known as: LYRICA  Take 200 mg by mouth 3 (three) times daily.   promethazine  25 MG tablet Commonly known as: PHENERGAN  Take 25 mg by mouth every 6 (six) hours as needed for nausea or vomiting.   rosuvastatin  40 MG tablet Commonly known as: CRESTOR  Take 1 tablet (40 mg total) by mouth daily.   Spiriva  Respimat 2.5 MCG/ACT Aers Generic drug: Tiotropium Bromide INHALE 2 PUFFS INTO THE LUNGS DAILY   temazepam 7.5 MG capsule Commonly known as: RESTORIL Take 7.5 mg by mouth daily.   tirzepatide  10 MG/0.5ML Pen Commonly known as: MOUNJARO  Inject 10 mg into the skin once a week.   tiZANidine  4 MG tablet Commonly known as: ZANAFLEX  Take 4 mg by mouth 3 (three) times daily as needed.   Vitamin D (Ergocalciferol) 1.25 MG (50000 UNIT) Caps capsule Commonly known as: DRISDOL Take 50,000 Units by mouth every Tuesday.         OBJECTIVE:   Vital Signs: BP 120/72 (BP Location: Left Arm, Patient Position: Sitting, Cuff Size: Normal)   Pulse 76   Ht 5' 2 (1.575 m)   Wt 197 lb (89.4 kg)   SpO2 98%   BMI 36.03 kg/m   Wt Readings from Last 3 Encounters:  03/03/24 197 lb (89.4 kg)  12/21/23 209 lb (94.8 kg)  12/07/22 209 lb (94.8 kg)     Exam: General: Pt appears well and is in NAD  Lungs: Clear with good BS bilat   Heart: RRR   Extremities: No pretibial edema.  Neuro: MS is good with appropriate affect, pt is alert and Ox3    DM foot  exam: 12/21/2023   The skin of the feet is intact without sores or ulcerations. The pedal pulses are 1+ on right and 1+ on left. The sensation is intact to a screening 5.07, 10 gram monofilament bilaterally    DATA REVIEWED:  Lab Results  Component Value Date   HGBA1C 5.5 03/03/2024   HGBA1C 6.0 (A) 12/21/2023   HGBA1C 6.3 (A) 11/25/2022    Latest Reference Range & Units 03/03/24 12:32  Prolactin ng/mL 9.4      Latest Reference Range & Units 12/21/23 13:32  Sodium 135 - 146 mmol/L 140  Potassium 3.5 - 5.3 mmol/L 4.6  Chloride 98 - 110 mmol/L 103  CO2 20 - 32 mmol/L 28  Glucose 65 - 99 mg/dL 78  BUN 7 - 25 mg/dL 13  Creatinine 9.39 - 8.99 mg/dL 8.72 (H)  Calcium  8.6 - 10.4 mg/dL 8.9  BUN/Creatinine Ratio 6 - 22 (calc) 10  eGFR > OR = 60 mL/min/1.35m2 45 (L)    Latest Reference Range & Units 12/21/23 13:32  Total CHOL/HDL Ratio <5.0 (calc) 3.5  Cholesterol <200 mg/dL 880  HDL Cholesterol > OR = 50 mg/dL 34 (L)  LDL Cholesterol (Calc) mg/dL (calc) 60  Non-HDL Cholesterol (Calc) <130 mg/dL (calc) 85  Triglycerides <150 mg/dL 821 (H)      Latest Reference Range & Units 12/21/23 13:42  Microalb, Ur mg/dL 0.2  MICROALB/CREAT RATIO <30 mg/g creat 1  Creatinine, Urine 20 - 275 mg/dL 804   In office BG 87 MGs/DL   ASSESSMENT / PLAN / RECOMMENDATIONS:   1) Type 2 Diabetes Mellitus, Optimally controlled, With neuropathic, CKD III and macrovascular complications - Most recent A1c of 5.5 %. Goal A1c < 7.0 %.    - A1c is is low, no glucose data today, but she does endorse rare hypoglycemia during the day, she has been taking Humalog  mix on average once daily, her BG in the office (fasting) 87 MGs/DL, I will decrease her insulin  makes and she will take it once daily with her biggest meal - We have opted to remain on current dose of Mounjaro  MEDICATIONS: -Decrease Humalog  Mix  8 units with Breakfast and 8 units with supper -Continue Farxiga  10 mg  Daily  - Continue Mounjaro   10 mg weekly  EDUCATION / INSTRUCTIONS: BG monitoring instructions: Patient is instructed to check her blood sugars 2 times a day, fasting and dinner. Call [facility] if: BG persistently < 70  I reviewed the Rule of 15 for the treatment of hypoglycemia in detail with the patient. Literature supplied.   2) Diabetic complications:  Eye: Does not have known diabetic retinopathy.  Neuro/ Feet: Does have known diabetic peripheral neuropathy .  Renal: Patient does have known baseline CKD. She   is on an ACEI/ARB at present.     3) Dyslipidemia:  - Lipid panel shows LDL at goal, but triglycerides are slightly elevated, due to nonfasting status - She was on Vascepa , but the patient has not had it in months  Medications Continue rosuvastatin  20 mg daily    4) Pituitary Microadenoma :  - Per pt, she has been diagnosed with pituitary adenoma in 2006. No prior sx . 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 - Pituitary hormone check was normal 06/2020  -She has slight  elevation of prolactin in February 2023 but repeat testing 12/2021 shows normal prolactin and TFTs - MRI showed 4x 2 mm pituitary adenoma (06/2020) - Patient endorses new onset headaches and would like to have a pituitary scan - Prolactin is within normal range, no change   Medication Continue cabergoline  0.5 mg, half a tablet twice weekly    F/U in 6 months    Signed electronically by: Stefano Redgie Butts, MD  [facility]  [facility] [address] [place] [place], [place] [address] Phone: [phone] FAX: [phone]   CC: Christina Santana CROME, NP [address] [place] [address] Phone: [phone]  Fax: [phone]  Return to Endocrinology clinic as below: Future Appointments  Date Time Provider Department Center  06/22/2024 10:30 AM Jalani Cullifer, Donell Redgie, MD  [facility] None

## 2024-03-06 ENCOUNTER — Ambulatory Visit: Payer: Self-pay | Admitting: Internal Medicine

## 2024-03-23 ENCOUNTER — Other Ambulatory Visit: Payer: Self-pay | Admitting: Obstetrics and Gynecology

## 2024-03-23 DIAGNOSIS — N838 Other noninflammatory disorders of ovary, fallopian tube and broad ligament: Secondary | ICD-10-CM

## 2024-03-27 ENCOUNTER — Telehealth: Payer: Self-pay | Admitting: *Deleted

## 2024-03-27 NOTE — Telephone Encounter (Signed)
 Spoke with the patient regarding the referral to GYN oncology. Patient scheduled as new patient with Dr Eldonna on 11/17 at 11:15 am.  Patient given an arrival time of 10:45 am.  Explained to the patient the the doctor will perform a pelvic exam at this visit. Patient given the policy that only one visitor allowed and that visitor must be over 16 yrs are allowed in the Cancer Center. Patient given the address/phone number for the clinic and that the center offers free valet service. Patient aware that masks optional.

## 2024-04-02 ENCOUNTER — Other Ambulatory Visit

## 2024-04-03 ENCOUNTER — Inpatient Hospital Stay: Admitting: Gynecologic Oncology

## 2024-04-03 ENCOUNTER — Encounter: Payer: Self-pay | Admitting: Psychiatry

## 2024-04-03 ENCOUNTER — Inpatient Hospital Stay: Attending: Psychiatry | Admitting: Psychiatry

## 2024-04-03 ENCOUNTER — Telehealth: Payer: Self-pay | Admitting: *Deleted

## 2024-04-03 VITALS — BP 136/70

## 2024-04-03 VITALS — BP 114/47 | HR 87 | Temp 98.6°F | Resp 19 | Ht 62.0 in | Wt 201.0 lb

## 2024-04-03 DIAGNOSIS — Z7951 Long term (current) use of inhaled steroids: Secondary | ICD-10-CM | POA: Insufficient documentation

## 2024-04-03 DIAGNOSIS — Z791 Long term (current) use of non-steroidal anti-inflammatories (NSAID): Secondary | ICD-10-CM | POA: Diagnosis not present

## 2024-04-03 DIAGNOSIS — Z7902 Long term (current) use of antithrombotics/antiplatelets: Secondary | ICD-10-CM | POA: Insufficient documentation

## 2024-04-03 DIAGNOSIS — E280 Estrogen excess: Secondary | ICD-10-CM | POA: Diagnosis not present

## 2024-04-03 DIAGNOSIS — Z7985 Long-term (current) use of injectable non-insulin antidiabetic drugs: Secondary | ICD-10-CM | POA: Insufficient documentation

## 2024-04-03 DIAGNOSIS — I110 Hypertensive heart disease with heart failure: Secondary | ICD-10-CM | POA: Diagnosis not present

## 2024-04-03 DIAGNOSIS — Z803 Family history of malignant neoplasm of breast: Secondary | ICD-10-CM | POA: Insufficient documentation

## 2024-04-03 DIAGNOSIS — G8929 Other chronic pain: Secondary | ICD-10-CM | POA: Insufficient documentation

## 2024-04-03 DIAGNOSIS — J449 Chronic obstructive pulmonary disease, unspecified: Secondary | ICD-10-CM | POA: Diagnosis not present

## 2024-04-03 DIAGNOSIS — M797 Fibromyalgia: Secondary | ICD-10-CM | POA: Diagnosis not present

## 2024-04-03 DIAGNOSIS — M549 Dorsalgia, unspecified: Secondary | ICD-10-CM | POA: Insufficient documentation

## 2024-04-03 DIAGNOSIS — D3912 Neoplasm of uncertain behavior of left ovary: Secondary | ICD-10-CM | POA: Diagnosis present

## 2024-04-03 DIAGNOSIS — N898 Other specified noninflammatory disorders of vagina: Secondary | ICD-10-CM | POA: Insufficient documentation

## 2024-04-03 DIAGNOSIS — Z8051 Family history of malignant neoplasm of kidney: Secondary | ICD-10-CM | POA: Insufficient documentation

## 2024-04-03 DIAGNOSIS — Z7984 Long term (current) use of oral hypoglycemic drugs: Secondary | ICD-10-CM | POA: Diagnosis not present

## 2024-04-03 DIAGNOSIS — N838 Other noninflammatory disorders of ovary, fallopian tube and broad ligament: Secondary | ICD-10-CM

## 2024-04-03 DIAGNOSIS — D573 Sickle-cell trait: Secondary | ICD-10-CM | POA: Insufficient documentation

## 2024-04-03 DIAGNOSIS — Z794 Long term (current) use of insulin: Secondary | ICD-10-CM | POA: Insufficient documentation

## 2024-04-03 DIAGNOSIS — R9389 Abnormal findings on diagnostic imaging of other specified body structures: Secondary | ICD-10-CM

## 2024-04-03 DIAGNOSIS — Z79891 Long term (current) use of opiate analgesic: Secondary | ICD-10-CM | POA: Diagnosis not present

## 2024-04-03 DIAGNOSIS — Z79899 Other long term (current) drug therapy: Secondary | ICD-10-CM | POA: Diagnosis not present

## 2024-04-03 DIAGNOSIS — F419 Anxiety disorder, unspecified: Secondary | ICD-10-CM | POA: Diagnosis not present

## 2024-04-03 DIAGNOSIS — K219 Gastro-esophageal reflux disease without esophagitis: Secondary | ICD-10-CM | POA: Insufficient documentation

## 2024-04-03 DIAGNOSIS — M1990 Unspecified osteoarthritis, unspecified site: Secondary | ICD-10-CM | POA: Insufficient documentation

## 2024-04-03 DIAGNOSIS — E785 Hyperlipidemia, unspecified: Secondary | ICD-10-CM | POA: Insufficient documentation

## 2024-04-03 DIAGNOSIS — E119 Type 2 diabetes mellitus without complications: Secondary | ICD-10-CM | POA: Diagnosis not present

## 2024-04-03 DIAGNOSIS — R978 Other abnormal tumor markers: Secondary | ICD-10-CM | POA: Insufficient documentation

## 2024-04-03 DIAGNOSIS — Z800 Family history of malignant neoplasm of digestive organs: Secondary | ICD-10-CM | POA: Diagnosis not present

## 2024-04-03 DIAGNOSIS — E1142 Type 2 diabetes mellitus with diabetic polyneuropathy: Secondary | ICD-10-CM

## 2024-04-03 DIAGNOSIS — N950 Postmenopausal bleeding: Secondary | ICD-10-CM | POA: Insufficient documentation

## 2024-04-03 DIAGNOSIS — Z807 Family history of other malignant neoplasms of lymphoid, hematopoietic and related tissues: Secondary | ICD-10-CM | POA: Insufficient documentation

## 2024-04-03 DIAGNOSIS — Z87891 Personal history of nicotine dependence: Secondary | ICD-10-CM | POA: Insufficient documentation

## 2024-04-03 DIAGNOSIS — I5032 Chronic diastolic (congestive) heart failure: Secondary | ICD-10-CM

## 2024-04-03 NOTE — Progress Notes (Signed)
 GYNECOLOGIC ONCOLOGY NEW PATIENT CONSULTATION  Date of Service: 04/03/2024 Referring Provider: Evalene Smiles, MD   ASSESSMENT AND PLAN: Christina Solis is a 73 y.o. woman with postmenopausal bleeding, thickened endometrium, and a complex adnexal mass with a normal inhibin B, mildly elevated inhibin A, and elevated estradiol.  We reviewed that the exact etiology of the pelvic mass is unclear, but could include a benign, borderline, or malignant process.  The recommended treatment is surgical excision to make a definitive diagnosis. Because the mass is relatively small, we feel that a minimally invasive approach is feasible, using robotic assistance.   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  Endometrial biopsy today to evaluate for any malignancy or premalignancy of the endometrium that would impact surgical procedure planning.  Patient is scheduled for CT abdomen/pelvis tomorrow.  Will follow-up with these results.  In the event of malignancy or borderline tumor on frozen section, we will perform indicated staging procedures. We discussed that these procedures may include omentectomy pelvic and/or para-aortic lymphadenectomy, peritoneal biopsies. We would also remove any tissue concerning for metastatic disease which could require additional procedures including bowel surgery.  Given her thickened endometrium and postmenopausal bleeding, we also reviewed the possibility of a malignancy or premalignancy inside the uterus.  Endometrial biopsy obtained today.  Will follow-up the results.  Pending these results we may consider sentinel lymph node evaluation and biopsy at time of the surgery.  Sentinel lymph node approach and limitations were reviewed.  Reviewed failure rate and possible need for lymphadenectomy pending intraoperative findings.  Patient was  consented for: Robotic assisted total laparoscopic hysterectomy, bilateral salpingo-oophorectomy, possible sentinel lymph node evaluation and biopsy, possible lymphadenectomy and staging on 04/25/2024.  The risks of surgery were discussed in detail and she understands these to including but not limited to bleeding requiring a blood transfusion, infection, injury to adjacent organs (including but not limited to the bowels, bladder, ureters, nerves, blood vessels), thromboembolic events, wound separation, hernia, vaginal cuff separation, possible risk of lymphedema and lymphocyst if lymphadenectomy performed, unforseen complication, possible need for re-exploration, and medical complications such as heart attack, stroke, pneumonia.  Reviewed that she may be at increased risk of an intraoperative complication due to her prior surgical history, or a medical complication given her medical comorbidities.  If the patient experiences any of these events, she understands that her hospitalization or recovery may be prolonged and that she may need to take additional medications for a prolonged period. The patient will receive DVT and antibiotic prophylaxis as indicated. She voiced a clear understanding. She had the opportunity to ask questions and informed consent was obtained today. She wishes to proceed.  Plan for patient to stay overnight for observation postop given her medical comorbidities. Given her history of cardiac disease and COPD, will obtain preoperative clearance from cardiology and analogy.  Will need to hold Plavix  preoperatively. Follow-up CT abdomen/pelvis results.  Follow-up endometrial biopsy results. Her METs are >4.  All preoperative instructions were reviewed. Postoperative expectations were also reviewed. Written handouts were provided to the patient.  Given family history of pancreatic cancer, referral to genetics placed.    A copy of this note was sent to the patient's referring  provider.  Hoy Masters, MD Gynecologic Oncology   Medical Decision Making I personally spent  TOTAL 60 minutes face-to-face and non-face-to-face in the care of this patient, which includes  all pre, intra, and post visit time on the date of service.   ------------  CC: Postmenopausal bleeding, thickened endometrium, pelvic mass  HISTORY OF PRESENT ILLNESS:  Christina Solis is a 73 y.o. woman who is seen in consultation at the request of Evalene Smiles, M for evaluation of complex pelvic mass along with postmenopausal bleeding and thickened endometrium.  Patient presented to Ob/Gyn on 03/02/24 for evaluation of PMB. Had one week of bleeding in September and then again in October. She also reports that she had an isolated episode in PMB. An endometrial biopsy was attempted but discontinued due to stenotic cervix and patient intolerance. She followed up on 03/23/2024 at which time a pelvic ultrasound noted a thickened and heterogeneous endometrium with endometrial stripe measuring 17.2 mm.  The right ovary was not visualized.  The left ovary noted a complex cystic mass containing solid-appearing areas with increased blood flow, measuring 8.5 x 6 cm.  No free fluid was seen.    Tumor markers noted a normal CEA of 2.3, normal AFP of 3.4, normal CA 19-9 of less than 2, and a normal CA125 of 16.8.  Inhibin B was less than 7.0 and inhibin A was barely elevated at 4.2.  Estradiol was noted to be elevated in a postmenopausal woman at 102.  LDH was within normal limits at 173.  Today patient endorses the history as above.  Reports that she does not currently have bleeding but does have some ongoing vaginal discharge.  She has also noted left lower quadrant pain for the past few weeks that she describes as aching and throbbing.  She has taken a Percocet for it recently.  She otherwise denies abdominal bloating, early satiety, significant weight loss, change in bowel or bladder habits.  In terms of her  medical comorbidities, she reports that for her COPD she does not need to use albuterol  frequently, maybe only about 1 time a month.  She follows with Hadassah Alt, NP with [facility].  She takes Percocet 10 mg 3 times a day for chronic back pain.  She follows with Harlene Biles at [facility].  She was last seen by cardiology a year ago.  She follows with Newman Lawrence, MD and Gearline Hila, NP with [facility].  She is on Plavix .  In terms of her diabetes her last A1c was on 03/03/2024 and was 5.5.  In terms of her family history of multiple cancers including multiple family members with pancreatic cancer, she is not aware of any genetic testing in her family.   PAST MEDICAL HISTORY: Past Medical History:  Diagnosis Date   Anemia    sickle cell trait   Anxiety    Arthritis    knees (10/12/2013)   CHF (congestive heart failure) (HCC)    Chronic back pain    Chronic bronchitis (HCC)    got it q yr when I lived in [place]   Complication of anesthesia    hard to put under   COPD (chronic obstructive pulmonary disease) (HCC)    Dysrhythmia    Family history of anesthesia complication    sister hard to wake up   Family history of anesthesia complication    niece have itching   Fibromyalgia    GERD (gastroesophageal reflux disease)    Hyperlipidemia    Hypertension    Irregular heart beat    Migraine    used to get them alot; not regular anymore (10/12/2013)   PAD (peripheral  artery disease)    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 CATH LAB;  Service: Cardiovascular;  Laterality: N/A;   APPENDECTOMY     BACK SURGERY     x 2, lower back   BILATERAL  CARPAL TUNNEL RELEASE     CARDIAC CATHETERIZATION     CATARACT EXTRACTION Left 2023   CATARACT EXTRACTION Right 2023   CESAREAN SECTION  1974; 1976; 1978   CHOLECYSTECTOMY N/A 10/12/2013   Procedure: LAPAROSCOPIC CHOLECYSTECTOMY WITH INTRAOPERATIVE CHOLANGIOGRAM;  Surgeon: Donnice POUR. Belinda, MD;  Location: [facility] OR;  Service: General;  Laterality: N/A;   COLONOSCOPY     FEMORAL ARTERY STENT Left 06/28/2012   SFA   LAPAROSCOPIC CHOLECYSTECTOMY  10/12/2013   LEFT HEART CATHETERIZATION WITH CORONARY ANGIOGRAM N/A 06/28/2012   Procedure: LEFT HEART CATHETERIZATION WITH CORONARY ANGIOGRAM;  Surgeon: Erick JONELLE Bergamo, MD;  Location: [facility] CATH LAB;  Service: Cardiovascular;  Laterality: N/A;   LOWER EXTREMITY ANGIOGRAM N/A 06/28/2012   Procedure: LOWER EXTREMITY ANGIOGRAM;  Surgeon: Erick JONELLE Bergamo, MD;  Location: [facility] CATH LAB;  Service: Cardiovascular;  Laterality: N/A;   PERIPHERAL VASCULAR CATHETERIZATION N/A 09/10/2015   Procedure: Abdominal Aortogram w/Lower Extremity;  Surgeon: Gordy Bergamo, MD;  Location: [facility] INVASIVE CV LAB;  Service: Cardiovascular;  Laterality: N/A;   POSTERIOR LUMBAR FUSION     REPLACEMENT TOTAL KNEE Left    TONSILLECTOMY     TUBAL LIGATION  1978    OB/GYN HISTORY: OB History  Gravida Para Term Preterm AB Living  3 3 3   3   SAB IAB Ectopic Multiple Live Births      3    # Outcome Date GA Lbr Len/2nd Weight Sex Type Anes PTL Lv  3 Term      CS-Unspec   LIV  2 Term      CS-Unspec   LIV  1 Term      CS-Unspec   LIV      Age at menarche: 9 Age at menopause: 31 Hx of HRT: no Hx of STI: no Last pap: Thinks she had in August with PCP, normal per pt History of abnormal pap smears: no  SCREENING STUDIES:  Last mammogram: 02/2024 Last colonoscopy: 2018  MEDICATIONS:  Current Outpatient Medications:    albuterol  (PROVENTIL ) (2.5 MG/3ML) 0.083% nebulizer solution, Take 2.5 mg by nebulization every 6 (six) hours as needed for wheezing or shortness of breath., Disp: ,  Rfl:    albuterol  (VENTOLIN  HFA) 108 (90 Base) MCG/ACT inhaler, Inhale 1 puff into the lungs every 6 (six) hours as needed for wheezing or shortness of breath. ProAir , Disp: , Rfl:    budesonide -formoterol  (SYMBICORT ) 160-4.5 MCG/ACT inhaler, Inhale 2 puffs into the lungs 2 (two) times daily., Disp: , Rfl:    cabergoline  (DOSTINEX ) 0.5 MG tablet, Take 0.5 tablets (0.25 mg total) by mouth 2 (two) times a week., Disp: 13 tablet, Rfl: 3   cetirizine -pseudoephedrine  (ZYRTEC -D) 5-120 MG tablet, Take 1 tablet by mouth daily., Disp: 15 tablet, Rfl: 0   clopidogrel  (PLAVIX ) 75 MG tablet, TAKE 1 TABLET(75 MG) BY MOUTH DAILY, Disp: 90 tablet, Rfl: 0   dapagliflozin  propanediol (FARXIGA ) 10 MG TABS tablet, Take 1 tablet (10 mg total)  by mouth daily., Disp: 90 tablet, Rfl: 3   diclofenac Sodium (VOLTAREN) 1 % GEL, Apply topically., Disp: , Rfl:    ferrous sulfate  325 (65 FE) MG tablet, Take 325 mg by mouth daily with breakfast. , Disp: , Rfl:    Insulin  Lispro Prot & Lispro (HUMALOG  MIX 75/25 KWIKPEN) (75-25) 100 UNIT/ML Kwikpen, Inject 8 Units into the skin in the morning and at bedtime., Disp: , Rfl:    Insulin  Pen Needle 32G X 4 MM MISC, 1 Device by Does not apply route in the morning and at bedtime., Disp: 200 each, Rfl: 3   lisinopril -hydrochlorothiazide  (PRINZIDE ,ZESTORETIC ) 20-12.5 MG per tablet, Take 1 tablet by mouth daily. , Disp: , Rfl:    lubiprostone (AMITIZA) 24 MCG capsule, Take 24 mcg by mouth 2 (two) times daily with a meal., Disp: , Rfl:    mometasone  (NASONEX ) 50 MCG/ACT nasal spray, Place 2 sprays into the nose as needed., Disp: , Rfl:    Multiple Vitamin (MULTIVITAMIN) capsule, Take 1 capsule by mouth daily., Disp: , Rfl:    naproxen (NAPROSYN) 250 MG tablet, Take 250 mg by mouth 2 (two) times daily., Disp: , Rfl:    Olopatadine  HCl 0.2 % SOLN, Apply 1 drop to eye daily. (Patient taking differently: Apply 1 drop to eye as needed.), Disp: 2.5 mL, Rfl: 0   oxyCODONE -acetaminophen  (PERCOCET)  10-325 MG tablet, Take 1 tablet by mouth every 4 (four) hours as needed., Disp: , Rfl:    pregabalin  (LYRICA ) 200 MG capsule, Take 200 mg by mouth 3 (three) times daily., Disp: , Rfl:    promethazine  (PHENERGAN ) 25 MG tablet, Take 25 mg by mouth every 6 (six) hours as needed for nausea or vomiting. , Disp: , Rfl:    rosuvastatin  (CRESTOR ) 40 MG tablet, Take 1 tablet (40 mg total) by mouth daily., Disp: 90 tablet, Rfl: 1   SPIRIVA  RESPIMAT 2.5 MCG/ACT AERS, INHALE 2 PUFFS INTO THE LUNGS DAILY, Disp: 4 g, Rfl: 5   temazepam (RESTORIL) 7.5 MG capsule, Take 7.5 mg by mouth daily., Disp: , Rfl:    tirzepatide  (MOUNJARO ) 10 MG/0.5ML Pen, Inject 10 mg into the skin once a week., Disp: 6 mL, Rfl: 3   tiZANidine  (ZANAFLEX ) 4 MG tablet, Take 4 mg by mouth 3 (three) times daily as needed., Disp: , Rfl:    Vitamin D, Ergocalciferol, (DRISDOL) 50000 UNITS CAPS capsule, Take 50,000 Units by mouth every Tuesday., Disp: , Rfl:   ALLERGIES: Allergies  Allergen Reactions   Coconut (Cocos Nucifera) Hives    FAMILY HISTORY: Family History  Problem Relation Age of Onset   Diabetes Mother    Kidney disease Mother    Pancreatic cancer Father    Kidney cancer Sister    Pancreatic cancer Sister    Kidney disease Brother    Lymphoma Brother    Pancreatic cancer Brother    Liver cancer Brother    Breast cancer Niece    Colon cancer Neg Hx    Inflammatory bowel disease Neg Hx    Esophageal cancer Neg Hx    Liver disease Neg Hx    Rectal cancer Neg Hx    Stomach cancer Neg Hx    Ovarian cancer Neg Hx     SOCIAL HISTORY: Social History   Socioeconomic History   Marital status: Divorced    Spouse name: Not on file   Number of children: 3   Years of education: 16   Highest education level: Not on file  Occupational History  Comment: retired [employer]  Tobacco Use   Smoking status: Former    Current packs/day: 0.00    Average packs/day: 0.5 packs/day for 46.0 years (23.0 ttl pk-yrs)    Types:  Cigarettes    Start date: 06/20/2000    Quit date: 09/29/2015    Years since quitting: 8.5   Smokeless tobacco: Never  Vaping Use   Vaping status: Never Used  Substance and Sexual Activity   Alcohol use: No    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   Drug use: No   Sexual activity: Not Currently  Other Topics Concern   Not on file  Social History Narrative   Lives with daughter    caffeine- none   Social Drivers of Corporate Investment Banker Strain: Not on file  Food Insecurity: Not on file  Transportation Needs: Not on file  Physical Activity: Not on file  Stress: Not on file  Social Connections: Unknown (09/20/2021)   [facility]   Social Network    Social Network: Not on file  Intimate Partner Violence: Unknown (08/21/2021)   Received from [facility]   HITS    Physically Hurt: Not on file    Insult or Talk Down To: Not on file    Threaten Physical Harm: Not on file    Scream or Curse: Not on file    REVIEW OF SYSTEMS: New patient intake form was reviewed.  Complete 10-system review is negative except for the following: Menstrual problems, joint pain, left sided pelvic pain, vaginal discharge  PHYSICAL EXAM: BP (!) 114/47 (BP Location: Right Arm, Patient Position: Sitting)   Pulse 87   Temp 98.6 F (37 C) (Oral)   Resp 19   Ht 5' 2 (1.575 m)   Wt 201 lb (91.2 kg)   SpO2 99%   BMI 36.76 kg/m  Constitutional: No acute distress. Neuro/Psych: Alert, oriented.  Head and Neck: Normocephalic, atraumatic. Neck symmetric without masses. Sclera anicteric.  Respiratory: Normal work of breathing. Clear to auscultation bilaterally. Cardiovascular: Regular rate and rhythm, no murmurs, rubs, or gallops. Abdomen: Normoactive bowel sounds. Soft, non-distended, non-tender to palpation. No masses appreciated.  Midline infraumbilical incision. Extremities: Grossly normal range of motion. Warm, well perfused. No edema bilaterally. Skin: No rashes or  lesions. Lymphatic: No cervical, supraclavicular, or inguinal adenopathy. Genitourinary: External genitalia without lesions. Urethral meatus without lesions or prolapse. On speculum exam, vagina and cervix without lesions.  Beach discharge in vagina.  Bimanual exam reveals normal cervix and small mobile uterus.  Left adnexal mass palpated. Exam chaperoned by Eleanor Epps, NP  EMB Procedure: After appropriate verbal informed consent was obtained, a timeout was performed. A sterile speculum was placed in the vagina, and the area was cleaned with betadine x3.  1 mL of 2% lidocaine  was injected into the anterior lip of the cervix.  A single-tooth tenaculum was placed on the anterior lip of the cervix.  A cervical block was placed with 9 mL of 2% lidocaine .  The os finder was used to dilate the cervix. An endometrial biopsy pipelle was advanced carefully to the uterine fundus which sounded to 8 cm. An adequate sample was obtained over 3 passes with predominantly blood in the first 2 passes. The tenaculum was removed, and tenaculum sites were noted to be hemostatic following application of silver nitrate. The speculum was removed from the vagina. The patient tolerated the procedure well.   UPT: Not indicated   LABORATORY AND RADIOLOGIC DATA: Outside medical records  were reviewed to synthesize the above history, along with the history and physical obtained during the visit.  Outside laboratory, pathology, and imaging reports were reviewed, with pertinent results below.  WBC  Date Value Ref Range Status  10/16/2021 10.4 4.0 - 10.5 K/uL Final   Hemoglobin  Date Value Ref Range Status  10/16/2021 12.5 12.0 - 15.0 g/dL Final   HCT  Date Value Ref Range Status  10/16/2021 39.2 36.0 - 46.0 % Final   Platelets  Date Value Ref Range Status  10/16/2021 225 150 - 400 K/uL Final   Magnesium  Date Value Ref Range Status  02/09/2016 2.3 1.7 - 2.4 mg/dL Final   Creat  Date Value Ref Range Status   12/21/2023 1.27 (H) 0.60 - 1.00 mg/dL Final   AST  Date Value Ref Range Status  08/16/2016 17 15 - 41 U/L Final   ALT  Date Value Ref Range Status  08/16/2016 13 (L) 14 - 54 U/L Final    Pelvis ultrasound (outside, 03/23/24): Uterus measures 11.02 x 5.22 x 5.44 cm.  Endometrial stripe measures 17.2 mm. 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 Endometrium thickened and heterogeneous with areas containing blood flow. Right ovary not visualized. Left ovarian complex cystic mass containing solid appearance with increased blood flow measuring 8.5 x 6 cm. No adnexal masses or free fluid seen.  Labs (03/23/24): CEA: 2.3 AFP: 3.4 CA19-9: <2 CA125: 16.8 Inhibin B: <7.0 Inhibin A: 4.2 (UL nml is <4.0) Estradiol: 103 (UL nml is 54.7) LDH: 173

## 2024-04-03 NOTE — Patient Instructions (Addendum)
 Today Dr. Eldonna performed an endometrial biopsy which is sampling from the lining of the uterus. You may have light bleeding after this.  Proceed with plan for CT scan previously ordered for tomorrow.  Dr. Eldonna has also placed an order for you to meet with the genetics counselor here at the [facility].  Preparing for your Surgery  Plan for surgery on April 25, 2024 with Dr. Hoy Eldonna at [facility]. You will be scheduled for robotic assisted total laparoscopic hysterectomy (removal of the uterus and cervix), bilateral salpingo-oophorectomy (removal of both ovaries and fallopian tubes), possible sentinel lymph node biopsy/possible lymph node dissection based on your endometrial biopsy results, possible staging (if a precancer or cancer is seen).   You will need to hold your mounjaro  for 7 days before surgery, hold your farxiga  for 3 days before surgery. We will reach out to your cardiology team about when to hold your plavix  (likely holding for 5 days).  Pre-operative Testing -You will receive a phone call from presurgical testing at [facility] to arrange for a pre-operative appointment and lab work.  -Bring your insurance card, copy of an advanced directive if applicable, medication list  -At that visit, you will be asked to sign a consent for a possible blood transfusion in case a transfusion becomes necessary during surgery.  The need for a blood transfusion is rare but having consent is a necessary part of your care.     -Do not take supplements such as fish oil  (omega 3), red yeast rice, turmeric before your surgery. STOP TAKING AT LEAST 10 DAYS BEFORE SURGERY. You want to avoid medications with aspirin  in them including headache powders such as BC or Goody's), Excedrin migraine.  -If you are taking a GLP-1 medication/injection such as Ozempic , Mounjaro , Wegovy , this needs to be held before surgery for at least 7 days before.  Day Before Surgery at  Home -You will be asked to take in a light diet the day before surgery. You will be advised you can have clear liquids up until 3 hours before your surgery.    Eat a light diet the day before surgery.  Examples including soups, broths, toast, yogurt, mashed potatoes.  AVOID GAS PRODUCING FOODS AND BEVERAGES. Things to avoid include carbonated beverages (fizzy beverages, sodas), raw fruits and raw vegetables (uncooked), or beans.   If your bowels are filled with gas, your surgeon will have difficulty visualizing your pelvic organs which increases your surgical risks.  Your role in recovery Your role is to become active as soon as directed by your doctor, while still giving yourself time to heal.  Rest when you feel tired. You will be asked to do the following in order to speed your recovery:  - Cough and breathe deeply. This helps to clear and expand your lungs and can prevent pneumonia after surgery.  - STAY ACTIVE WHEN YOU GET HOME. Do mild physical activity. Walking or moving your legs help your circulation and body functions return to normal. Do not try to get up or walk alone the first time after surgery.   -If you develop swelling on one leg or the other, pain in the back of your leg, redness/warmth in one of your legs, please call the office or go to the Emergency Room to have a doppler to rule out a blood clot. For shortness of breath, chest pain-seek care in the Emergency Room as soon as possible. - Actively manage your pain. Managing your pain lets  you move in comfort. We will ask you to rate your pain on a scale of zero to 10. It is your responsibility to tell your doctor or nurse where and how much you hurt so your pain can be treated.  Special Considerations -If you are diabetic, you may be placed on insulin  after surgery to have closer control over your blood sugars to promote healing and recovery.  This does not mean that you will be discharged on insulin .  If applicable, your oral  antidiabetics will be resumed when you are tolerating a solid diet.  -Your final pathology results from surgery should be available around one week after surgery and the results will be relayed to you when available.  -Dr. Olam Mill is the surgeon that assists your GYN Oncologist with surgery.  If you end up staying the night, the next day after your surgery you will either see Dr. Viktoria, Dr. Eldonna, or Dr. Olam Mill.  -FMLA forms can be faxed to [phone] and please allow 5-7 business days for completion.  Pain Management After Surgery -Continue on your chronic pain regimen.  Bowel Regimen -Plan to continue taking amitiza to prevent constipation.  Risks of Surgery Risks of surgery are low but include bleeding, infection, damage to surrounding structures, re-operation, blood clots, and very rarely death.   Blood Transfusion Information (For the consent to be signed before surgery)  We will be checking your blood type before surgery so in case of emergencies, we will know what type of blood you would need.                                            WHAT IS A BLOOD TRANSFUSION?  A transfusion is the replacement of blood or some of its parts. Blood is made up of multiple cells which provide different functions. Red blood cells carry oxygen and are used for blood loss replacement. White blood cells fight against infection. Platelets control bleeding. Plasma helps clot blood. Other blood products are available for specialized needs, such as hemophilia or other clotting disorders. BEFORE THE TRANSFUSION  Who gives blood for transfusions?  You may be able to donate blood to be used at a later date on yourself (autologous donation). Relatives can be asked to donate blood. This is generally not any safer than if you have received blood from a stranger. The same precautions are taken to ensure safety when a relative's blood is donated. Healthy volunteers who are fully  evaluated to make sure their blood is safe. This is blood bank blood. Transfusion therapy is the safest it has ever been in the practice of medicine. Before blood is taken from a donor, a complete history is taken to make sure that person has no history of diseases nor engages in risky social behavior (examples are intravenous drug use or sexual activity with multiple partners). The donor's travel history is screened to minimize risk of transmitting infections, such as malaria. The donated blood is tested for signs of infectious diseases, such as HIV and hepatitis. The blood is then tested to be sure it is compatible with you in order to minimize the chance of a transfusion reaction. If you or a relative donates blood, this is often done in anticipation of surgery and is not appropriate for emergency situations. It takes many days to process the donated blood. RISKS AND COMPLICATIONS Although transfusion  therapy is very safe and saves many lives, the main dangers of transfusion include:  Getting an infectious disease. Developing a transfusion reaction. This is an allergic reaction to something in the blood you were given. Every precaution is taken to prevent this. The decision to have a blood transfusion has been considered carefully by your caregiver before blood is given. Blood is not given unless the benefits outweigh the risks.  AFTER SURGERY INSTRUCTIONS  Return to work: 4-6 weeks if applicable  Activity: 1. Be up and out of the bed during the day.  Take a nap if needed.  You may walk up steps but be careful and use the hand rail.  Stair climbing will tire you more than you think, you may need to stop part way and rest.   2. No lifting or straining for 6 weeks over 10 pounds. No pushing, pulling, straining for 6 weeks.  3. No driving for 4-89 days when the following criteria have been met: Do not drive if you are taking narcotic pain medicine and make sure that your reaction time has returned.    4. You can shower as soon as the next day after surgery. Shower daily.  Use your regular soap and water  (not directly on the incision) and pat your incision(s) dry afterwards; don't rub.  No tub baths or submerging your body in water  until cleared by your surgeon. If you have the soap that was given to you by pre-surgical testing that was used before surgery, you do not need to use it afterwards because this can irritate your incisions.   5. No sexual activity and nothing in the vagina for 12 weeks.  6. You may experience a small amount of clear drainage from your incisions, which is normal.  If the drainage persists, increases, or changes color please call the office.  7. Do not use creams, lotions, or ointments such as neosporin on your incisions after surgery until advised by your surgeon because they can cause removal of the dermabond glue on your incisions.    8. You may experience vaginal spotting after surgery or when the stitches at the top of the vagina begin to dissolve.  The spotting is normal but if you experience heavy bleeding, call our office.  Diet: 1. Low sodium Heart Healthy Diet is recommended but you are cleared to resume your normal (before surgery) diet after your procedure.  2.  surgery to keep bowel movements regular and to prevent constipation.    Wound Care: 1. Keep clean and dry.  Shower daily.  Reasons to call the Doctor: Fever - Oral temperature greater than 100.4 degrees Fahrenheit Foul-smelling vaginal discharge Difficulty urinating Nausea and vomiting Increased pain at the site of the incision that is unrelieved with pain medicine. Difficulty breathing with or without chest pain New calf pain especially if only on one side Sudden, continuing increased vaginal bleeding with or without clots.   Contacts: For questions or concerns you should contact:  Dr. Hoy Masters at [phone]  Eleanor Epps, NP at [phone]  After Hours: call  [phone] and have the GYN Oncologist paged/contacted (after 5 pm or on the weekends). You will speak with an after hours RN and let he or she know you have had surgery.  Messages sent via mychart are for non-urgent matters and are not responded to after hours so for urgent needs, please call the after hours number.

## 2024-04-03 NOTE — Progress Notes (Signed)
 Patient here for a consult with Dr. Eldonna and for a pre-operative appointment prior to her scheduled surgery on 04/25/2024. She is scheduled for a robotic assisted total laparoscopic hysterectomy, bilateral salpingo-oophorectomy, possible sentinel lymph node biopsy/possible lymph node dissection, possible staging. The surgery was discussed in detail.  See after visit summary for additional details.    Discussed post-op pain management in detail including the aspects of the enhanced recovery pathway.  We discussed the use of tylenol  post-op and to monitor for a maximum of 4,000 mg in a 24 hour period.  Discussed bowel regimen in detail.     Discussed the use of SCDs and measures to take at home to prevent DVT including frequent mobility.  Reportable signs and symptoms of DVT discussed. Post-operative instructions discussed and expectations for after surgery. Incisional care discussed as well including reportable signs and symptoms including erythema, drainage, wound separation.     30 minutes spent with the patient.  Verbalizing understanding of material discussed. No needs or concerns voiced at the end of the visit.   Advised patient to call for any needs.  Advised that her post-operative medications had been prescribed and could be picked up at any time.    This appointment is included in the global surgical bundle as pre-operative teaching and has no charge.

## 2024-04-03 NOTE — Telephone Encounter (Signed)
 Per Dr Eldonna fax records and surgical optimization form to the patient's Cardiology office (Dr Evern [phone]) and Pulmonary office Merwyn Alt [phone])

## 2024-04-03 NOTE — H&P (View-Only) (Signed)
 GYNECOLOGIC ONCOLOGY NEW PATIENT CONSULTATION  Date of Service: 04/03/2024 Referring Provider: Evalene Smiles, MD   ASSESSMENT AND PLAN: Christina Solis is a 73 y.o. woman with postmenopausal bleeding, thickened endometrium, and a complex adnexal mass with a normal inhibin B, mildly elevated inhibin A, and elevated estradiol.  We reviewed that the exact etiology of the pelvic mass is unclear, but could include a benign, borderline, or malignant process.  The recommended treatment is surgical excision to make a definitive diagnosis. Because the mass is relatively small, we feel that a minimally invasive approach is feasible, using robotic assistance.   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  Endometrial biopsy today to evaluate for any malignancy or premalignancy of the endometrium that would impact surgical procedure planning.  Patient is scheduled for CT abdomen/pelvis tomorrow.  Will follow-up with these results.  In the event of malignancy or borderline tumor on frozen section, we will perform indicated staging procedures. We discussed that these procedures may include omentectomy pelvic and/or para-aortic lymphadenectomy, peritoneal biopsies. We would also remove any tissue concerning for metastatic disease which could require additional procedures including bowel surgery.  Given her thickened endometrium and postmenopausal bleeding, we also reviewed the possibility of a malignancy or premalignancy inside the uterus.  Endometrial biopsy obtained today.  Will follow-up the results.  Pending these results we may consider sentinel lymph node evaluation and biopsy at time of the surgery.  Sentinel lymph node approach and limitations were reviewed.  Reviewed failure rate and possible need for lymphadenectomy pending intraoperative findings.  Patient was  consented for: Robotic assisted total laparoscopic hysterectomy, bilateral salpingo-oophorectomy, possible sentinel lymph node evaluation and biopsy, possible lymphadenectomy and staging on 04/25/2024.  The risks of surgery were discussed in detail and she understands these to including but not limited to bleeding requiring a blood transfusion, infection, injury to adjacent organs (including but not limited to the bowels, bladder, ureters, nerves, blood vessels), thromboembolic events, wound separation, hernia, vaginal cuff separation, possible risk of lymphedema and lymphocyst if lymphadenectomy performed, unforseen complication, possible need for re-exploration, and medical complications such as heart attack, stroke, pneumonia.  Reviewed that she may be at increased risk of an intraoperative complication due to her prior surgical history, or a medical complication given her medical comorbidities.  If the patient experiences any of these events, she understands that her hospitalization or recovery may be prolonged and that she may need to take additional medications for a prolonged period. The patient will receive DVT and antibiotic prophylaxis as indicated. She voiced a clear understanding. She had the opportunity to ask questions and informed consent was obtained today. She wishes to proceed.  Plan for patient to stay overnight for observation postop given her medical comorbidities. Given her history of cardiac disease and COPD, will obtain preoperative clearance from cardiology and analogy.  Will need to hold Plavix  preoperatively. Follow-up CT abdomen/pelvis results.  Follow-up endometrial biopsy results. Her METs are >4.  All preoperative instructions were reviewed. Postoperative expectations were also reviewed. Written handouts were provided to the patient.  Given family history of pancreatic cancer, referral to genetics placed.    A copy of this note was sent to the patient's referring  provider.  Hoy Masters, MD Gynecologic Oncology   Medical Decision Making I personally spent  TOTAL 60 minutes face-to-face and non-face-to-face in the care of this patient, which includes  all pre, intra, and post visit time on the date of service.   ------------  CC: Postmenopausal bleeding, thickened endometrium, pelvic mass  HISTORY OF PRESENT ILLNESS:  Christina Solis is a 73 y.o. woman who is seen in consultation at the request of Evalene Smiles, M for evaluation of complex pelvic mass along with postmenopausal bleeding and thickened endometrium.  Patient presented to Ob/Gyn on 03/02/24 for evaluation of PMB. Had one week of bleeding in September and then again in October. She also reports that she had an isolated episode in PMB. An endometrial biopsy was attempted but discontinued due to stenotic cervix and patient intolerance. She followed up on 03/23/2024 at which time a pelvic ultrasound noted a thickened and heterogeneous endometrium with endometrial stripe measuring 17.2 mm.  The right ovary was not visualized.  The left ovary noted a complex cystic mass containing solid-appearing areas with increased blood flow, measuring 8.5 x 6 cm.  No free fluid was seen.    Tumor markers noted a normal CEA of 2.3, normal AFP of 3.4, normal CA 19-9 of less than 2, and a normal CA125 of 16.8.  Inhibin B was less than 7.0 and inhibin A was barely elevated at 4.2.  Estradiol was noted to be elevated in a postmenopausal woman at 102.  LDH was within normal limits at 173.  Today patient endorses the history as above.  Reports that she does not currently have bleeding but does have some ongoing vaginal discharge.  She has also noted left lower quadrant pain for the past few weeks that she describes as aching and throbbing.  She has taken a Percocet for it recently.  She otherwise denies abdominal bloating, early satiety, significant weight loss, change in bowel or bladder habits.  In terms of her  medical comorbidities, she reports that for her COPD she does not need to use albuterol  frequently, maybe only about 1 time a month.  She follows with Hadassah Alt, NP with [facility].  She takes Percocet 10 mg 3 times a day for chronic back pain.  She follows with Harlene Biles at [facility].  She was last seen by cardiology a year ago.  She follows with Newman Lawrence, MD and Gearline Hila, NP with [facility].  She is on Plavix .  In terms of her diabetes her last A1c was on 03/03/2024 and was 5.5.  In terms of her family history of multiple cancers including multiple family members with pancreatic cancer, she is not aware of any genetic testing in her family.   PAST MEDICAL HISTORY: Past Medical History:  Diagnosis Date   Anemia    sickle cell trait   Anxiety    Arthritis    knees (10/12/2013)   CHF (congestive heart failure) (HCC)    Chronic back pain    Chronic bronchitis (HCC)    got it q yr when I lived in [place]   Complication of anesthesia    hard to put under   COPD (chronic obstructive pulmonary disease) (HCC)    Dysrhythmia    Family history of anesthesia complication    sister hard to wake up   Family history of anesthesia complication    niece have itching   Fibromyalgia    GERD (gastroesophageal reflux disease)    Hyperlipidemia    Hypertension    Irregular heart beat    Migraine    used to get them alot; not regular anymore (10/12/2013)   PAD (peripheral  artery disease)    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 CATH LAB;  Service: Cardiovascular;  Laterality: N/A;   APPENDECTOMY     BACK SURGERY     x 2, lower back   BILATERAL  CARPAL TUNNEL RELEASE     CARDIAC CATHETERIZATION     CATARACT EXTRACTION Left 2023   CATARACT EXTRACTION Right 2023   CESAREAN SECTION  1974; 1976; 1978   CHOLECYSTECTOMY N/A 10/12/2013   Procedure: LAPAROSCOPIC CHOLECYSTECTOMY WITH INTRAOPERATIVE CHOLANGIOGRAM;  Surgeon: Donnice POUR. Belinda, MD;  Location: [facility] OR;  Service: General;  Laterality: N/A;   COLONOSCOPY     FEMORAL ARTERY STENT Left 06/28/2012   SFA   LAPAROSCOPIC CHOLECYSTECTOMY  10/12/2013   LEFT HEART CATHETERIZATION WITH CORONARY ANGIOGRAM N/A 06/28/2012   Procedure: LEFT HEART CATHETERIZATION WITH CORONARY ANGIOGRAM;  Surgeon: Erick JONELLE Bergamo, MD;  Location: [facility] CATH LAB;  Service: Cardiovascular;  Laterality: N/A;   LOWER EXTREMITY ANGIOGRAM N/A 06/28/2012   Procedure: LOWER EXTREMITY ANGIOGRAM;  Surgeon: Erick JONELLE Bergamo, MD;  Location: [facility] CATH LAB;  Service: Cardiovascular;  Laterality: N/A;   PERIPHERAL VASCULAR CATHETERIZATION N/A 09/10/2015   Procedure: Abdominal Aortogram w/Lower Extremity;  Surgeon: Gordy Bergamo, MD;  Location: [facility] INVASIVE CV LAB;  Service: Cardiovascular;  Laterality: N/A;   POSTERIOR LUMBAR FUSION     REPLACEMENT TOTAL KNEE Left    TONSILLECTOMY     TUBAL LIGATION  1978    OB/GYN HISTORY: OB History  Gravida Para Term Preterm AB Living  3 3 3   3   SAB IAB Ectopic Multiple Live Births      3    # Outcome Date GA Lbr Len/2nd Weight Sex Type Anes PTL Lv  3 Term      CS-Unspec   LIV  2 Term      CS-Unspec   LIV  1 Term      CS-Unspec   LIV      Age at menarche: 9 Age at menopause: 31 Hx of HRT: no Hx of STI: no Last pap: Thinks she had in August with PCP, normal per pt History of abnormal pap smears: no  SCREENING STUDIES:  Last mammogram: 02/2024 Last colonoscopy: 2018  MEDICATIONS:  Current Outpatient Medications:    albuterol  (PROVENTIL ) (2.5 MG/3ML) 0.083% nebulizer solution, Take 2.5 mg by nebulization every 6 (six) hours as needed for wheezing or shortness of breath., Disp: ,  Rfl:    albuterol  (VENTOLIN  HFA) 108 (90 Base) MCG/ACT inhaler, Inhale 1 puff into the lungs every 6 (six) hours as needed for wheezing or shortness of breath. ProAir , Disp: , Rfl:    budesonide -formoterol  (SYMBICORT ) 160-4.5 MCG/ACT inhaler, Inhale 2 puffs into the lungs 2 (two) times daily., Disp: , Rfl:    cabergoline  (DOSTINEX ) 0.5 MG tablet, Take 0.5 tablets (0.25 mg total) by mouth 2 (two) times a week., Disp: 13 tablet, Rfl: 3   cetirizine -pseudoephedrine  (ZYRTEC -D) 5-120 MG tablet, Take 1 tablet by mouth daily., Disp: 15 tablet, Rfl: 0   clopidogrel  (PLAVIX ) 75 MG tablet, TAKE 1 TABLET(75 MG) BY MOUTH DAILY, Disp: 90 tablet, Rfl: 0   dapagliflozin  propanediol (FARXIGA ) 10 MG TABS tablet, Take 1 tablet (10 mg total)  by mouth daily., Disp: 90 tablet, Rfl: 3   diclofenac Sodium (VOLTAREN) 1 % GEL, Apply topically., Disp: , Rfl:    ferrous sulfate  325 (65 FE) MG tablet, Take 325 mg by mouth daily with breakfast. , Disp: , Rfl:    Insulin  Lispro Prot & Lispro (HUMALOG  MIX 75/25 KWIKPEN) (75-25) 100 UNIT/ML Kwikpen, Inject 8 Units into the skin in the morning and at bedtime., Disp: , Rfl:    Insulin  Pen Needle 32G X 4 MM MISC, 1 Device by Does not apply route in the morning and at bedtime., Disp: 200 each, Rfl: 3   lisinopril -hydrochlorothiazide  (PRINZIDE ,ZESTORETIC ) 20-12.5 MG per tablet, Take 1 tablet by mouth daily. , Disp: , Rfl:    lubiprostone (AMITIZA) 24 MCG capsule, Take 24 mcg by mouth 2 (two) times daily with a meal., Disp: , Rfl:    mometasone  (NASONEX ) 50 MCG/ACT nasal spray, Place 2 sprays into the nose as needed., Disp: , Rfl:    Multiple Vitamin (MULTIVITAMIN) capsule, Take 1 capsule by mouth daily., Disp: , Rfl:    naproxen (NAPROSYN) 250 MG tablet, Take 250 mg by mouth 2 (two) times daily., Disp: , Rfl:    Olopatadine  HCl 0.2 % SOLN, Apply 1 drop to eye daily. (Patient taking differently: Apply 1 drop to eye as needed.), Disp: 2.5 mL, Rfl: 0   oxyCODONE -acetaminophen  (PERCOCET)  10-325 MG tablet, Take 1 tablet by mouth every 4 (four) hours as needed., Disp: , Rfl:    pregabalin  (LYRICA ) 200 MG capsule, Take 200 mg by mouth 3 (three) times daily., Disp: , Rfl:    promethazine  (PHENERGAN ) 25 MG tablet, Take 25 mg by mouth every 6 (six) hours as needed for nausea or vomiting. , Disp: , Rfl:    rosuvastatin  (CRESTOR ) 40 MG tablet, Take 1 tablet (40 mg total) by mouth daily., Disp: 90 tablet, Rfl: 1   SPIRIVA  RESPIMAT 2.5 MCG/ACT AERS, INHALE 2 PUFFS INTO THE LUNGS DAILY, Disp: 4 g, Rfl: 5   temazepam (RESTORIL) 7.5 MG capsule, Take 7.5 mg by mouth daily., Disp: , Rfl:    tirzepatide  (MOUNJARO ) 10 MG/0.5ML Pen, Inject 10 mg into the skin once a week., Disp: 6 mL, Rfl: 3   tiZANidine  (ZANAFLEX ) 4 MG tablet, Take 4 mg by mouth 3 (three) times daily as needed., Disp: , Rfl:    Vitamin D, Ergocalciferol, (DRISDOL) 50000 UNITS CAPS capsule, Take 50,000 Units by mouth every Tuesday., Disp: , Rfl:   ALLERGIES: Allergies  Allergen Reactions   Coconut (Cocos Nucifera) Hives    FAMILY HISTORY: Family History  Problem Relation Age of Onset   Diabetes Mother    Kidney disease Mother    Pancreatic cancer Father    Kidney cancer Sister    Pancreatic cancer Sister    Kidney disease Brother    Lymphoma Brother    Pancreatic cancer Brother    Liver cancer Brother    Breast cancer Niece    Colon cancer Neg Hx    Inflammatory bowel disease Neg Hx    Esophageal cancer Neg Hx    Liver disease Neg Hx    Rectal cancer Neg Hx    Stomach cancer Neg Hx    Ovarian cancer Neg Hx     SOCIAL HISTORY: Social History   Socioeconomic History   Marital status: Divorced    Spouse name: Not on file   Number of children: 3   Years of education: 16   Highest education level: Not on file  Occupational History  Comment: retired [employer]  Tobacco Use   Smoking status: Former    Current packs/day: 0.00    Average packs/day: 0.5 packs/day for 46.0 years (23.0 ttl pk-yrs)    Types:  Cigarettes    Start date: 06/20/2000    Quit date: 09/29/2015    Years since quitting: 8.5   Smokeless tobacco: Never  Vaping Use   Vaping status: Never Used  Substance and Sexual Activity   Alcohol use: No    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   Drug use: No   Sexual activity: Not Currently  Other Topics Concern   Not on file  Social History Narrative   Lives with daughter    caffeine- none   Social Drivers of Corporate Investment Banker Strain: Not on file  Food Insecurity: Not on file  Transportation Needs: Not on file  Physical Activity: Not on file  Stress: Not on file  Social Connections: Unknown (09/20/2021)   [facility]   Social Network    Social Network: Not on file  Intimate Partner Violence: Unknown (08/21/2021)   Received from [facility]   HITS    Physically Hurt: Not on file    Insult or Talk Down To: Not on file    Threaten Physical Harm: Not on file    Scream or Curse: Not on file    REVIEW OF SYSTEMS: New patient intake form was reviewed.  Complete 10-system review is negative except for the following: Menstrual problems, joint pain, left sided pelvic pain, vaginal discharge  PHYSICAL EXAM: BP (!) 114/47 (BP Location: Right Arm, Patient Position: Sitting)   Pulse 87   Temp 98.6 F (37 C) (Oral)   Resp 19   Ht 5' 2 (1.575 m)   Wt 201 lb (91.2 kg)   SpO2 99%   BMI 36.76 kg/m  Constitutional: No acute distress. Neuro/Psych: Alert, oriented.  Head and Neck: Normocephalic, atraumatic. Neck symmetric without masses. Sclera anicteric.  Respiratory: Normal work of breathing. Clear to auscultation bilaterally. Cardiovascular: Regular rate and rhythm, no murmurs, rubs, or gallops. Abdomen: Normoactive bowel sounds. Soft, non-distended, non-tender to palpation. No masses appreciated.  Midline infraumbilical incision. Extremities: Grossly normal range of motion. Warm, well perfused. No edema bilaterally. Skin: No rashes or  lesions. Lymphatic: No cervical, supraclavicular, or inguinal adenopathy. Genitourinary: External genitalia without lesions. Urethral meatus without lesions or prolapse. On speculum exam, vagina and cervix without lesions.  Beach discharge in vagina.  Bimanual exam reveals normal cervix and small mobile uterus.  Left adnexal mass palpated. Exam chaperoned by Eleanor Epps, NP  EMB Procedure: After appropriate verbal informed consent was obtained, a timeout was performed. A sterile speculum was placed in the vagina, and the area was cleaned with betadine x3.  1 mL of 2% lidocaine  was injected into the anterior lip of the cervix.  A single-tooth tenaculum was placed on the anterior lip of the cervix.  A cervical block was placed with 9 mL of 2% lidocaine .  The os finder was used to dilate the cervix. An endometrial biopsy pipelle was advanced carefully to the uterine fundus which sounded to 8 cm. An adequate sample was obtained over 3 passes with predominantly blood in the first 2 passes. The tenaculum was removed, and tenaculum sites were noted to be hemostatic following application of silver nitrate. The speculum was removed from the vagina. The patient tolerated the procedure well.   UPT: Not indicated   LABORATORY AND RADIOLOGIC DATA: Outside medical records  were reviewed to synthesize the above history, along with the history and physical obtained during the visit.  Outside laboratory, pathology, and imaging reports were reviewed, with pertinent results below.  WBC  Date Value Ref Range Status  10/16/2021 10.4 4.0 - 10.5 K/uL Final   Hemoglobin  Date Value Ref Range Status  10/16/2021 12.5 12.0 - 15.0 g/dL Final   HCT  Date Value Ref Range Status  10/16/2021 39.2 36.0 - 46.0 % Final   Platelets  Date Value Ref Range Status  10/16/2021 225 150 - 400 K/uL Final   Magnesium  Date Value Ref Range Status  02/09/2016 2.3 1.7 - 2.4 mg/dL Final   Creat  Date Value Ref Range Status   12/21/2023 1.27 (H) 0.60 - 1.00 mg/dL Final   AST  Date Value Ref Range Status  08/16/2016 17 15 - 41 U/L Final   ALT  Date Value Ref Range Status  08/16/2016 13 (L) 14 - 54 U/L Final    Pelvis ultrasound (outside, 03/23/24): Uterus measures 11.02 x 5.22 x 5.44 cm.  Endometrial stripe measures 17.2 mm. 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 Endometrium thickened and heterogeneous with areas containing blood flow. Right ovary not visualized. Left ovarian complex cystic mass containing solid appearance with increased blood flow measuring 8.5 x 6 cm. No adnexal masses or free fluid seen.  Labs (03/23/24): CEA: 2.3 AFP: 3.4 CA19-9: <2 CA125: 16.8 Inhibin B: <7.0 Inhibin A: 4.2 (UL nml is <4.0) Estradiol: 103 (UL nml is 54.7) LDH: 173

## 2024-04-04 ENCOUNTER — Telehealth (HOSPITAL_BASED_OUTPATIENT_CLINIC_OR_DEPARTMENT_OTHER): Payer: Self-pay | Admitting: *Deleted

## 2024-04-04 ENCOUNTER — Ambulatory Visit
Admission: RE | Admit: 2024-04-04 | Discharge: 2024-04-04 | Disposition: A | Discharge status: Home / Self Care | Source: Ambulatory Visit | Attending: Obstetrics and Gynecology | Admitting: Obstetrics and Gynecology

## 2024-04-04 DIAGNOSIS — N838 Other noninflammatory disorders of ovary, fallopian tube and broad ligament: Secondary | ICD-10-CM

## 2024-04-04 MED ORDER — IOPAMIDOL (ISOVUE-300) INJECTION 61%
80.0000 mL | Freq: Once | INTRAVENOUS | Status: AC | PRN
  Administered 2024-04-04: 80 mL via INTRAVENOUS

## 2024-04-04 NOTE — Telephone Encounter (Signed)
   Pre-operative Risk Assessment    Patient Name: Christina Solis  DOB: Jun 14, 1950 MRN: [id number]   Date of last office visit: 12/07/22 DR. PATWARDHAN Date of next office visit: NONE   Request for Surgical Clearance    Procedure:  ROBOTIC ASSISTED TOTAL LAPAROSCOPIC HYSTERECTOMY, B/L SALPINGO-OOPHORECTOMY, POSSIBLE LYMPH NODE DISSECTION Bx   Date of Surgery:  Clearance 04/25/24                                Surgeon:  DR. HOY MASTERS Surgeon's Group or Practice Name:  [facility] GYN/ONCOLOGY Phone number:  [phone] Fax number:  [phone]   Type of Clearance Requested:   - Medical  - Pharmacy:  Hold Clopidogrel  (Plavix ) NEEDS PRE AND POST INSTRUCTIONS   Type of Anesthesia:  General    Additional requests/questions:    [name]   04/04/2024, 10:45 AM

## 2024-04-04 NOTE — Telephone Encounter (Signed)
 Patient has been scheduled for OFFICE VISIT

## 2024-04-04 NOTE — Telephone Encounter (Signed)
   Name: Christina Solis  DOB: Jun 03, 1950  MRN: [id number]  Primary Cardiologist: Newman JINNY Lawrence, MD  Chart reviewed as part of pre-operative protocol coverage. Because of Christina Solis's past medical history and time since last visit, Christina Solis will require a follow-up in-office visit in order to better assess preoperative cardiovascular risk.  Pre-op covering staff: - Please schedule appointment and call patient to inform them. If patient already had an upcoming appointment within acceptable timeframe, please add pre-op clearance to the appointment notes so provider is aware. - Please contact requesting surgeon's office via preferred method (i.e, phone, fax) to inform them of need for appointment prior to surgery.   Jon Garre Landry Lookingbill, PA  04/04/2024, 11:16 AM

## 2024-04-05 ENCOUNTER — Encounter: Payer: Self-pay | Admitting: Obstetrics and Gynecology

## 2024-04-05 LAB — SURGICAL PATHOLOGY

## 2024-04-10 ENCOUNTER — Ambulatory Visit: Payer: Self-pay | Admitting: Psychiatry

## 2024-04-10 NOTE — Progress Notes (Unsigned)
 Cardiology Office Note:    Date:  04/11/2024   ID:  Christina Solis, DOB Oct 10, 1950, MRN [id number]  PCP:  Joshua Santana CROME, NP   [facility] Providers Cardiologist:  Newman JINNY Lawrence, MD     Referring MD: Joshua Santana CROME, NP   Chief complaint: Preoperative cardiovascular clearance     History of Present Illness:   Christina Solis is a 73 y.o. female with a hx of congestive heart failure with preserved ejection fraction, hypertension, hyperlipidemia, prior tobacco use, moderate coronary artery stenosis, PAD s/p left SFA stenting around 2014, T2DM presenting today for preoperative cardiac clearance for laparoscopic hysterectomy, bilateral salpingo-oophorectomy, possible lymph node dissection biopsy scheduled for 04/25/2024.  Presented to [facility] cardiology in 2018 following complaints of chest pain and tightness, echo was ordered showing LVEF 55-60%, moderate focal basal hypertrophy, possible hypokinesis of the mid anteroseptal myocardium, G1 DD, trivial aortic regurgitation, mildly dilated right ventricle with normal wall thickness, trivial tricuspid regurgitation, mild pulmonic regurgitation. Patient was lost to follow-up until 2021, where she saw Dr. Lawrence for her peripheral vascular disease.  ABIs 02/2020 revealed moderately decreased perfusion of the right lower extremity, noted at the [facility] and PT arterial level on the right and normal perfusion of the LLE. 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  Seen in his [place] 10/2021 with complaints of chest pain, ACS was ruled out, pain was pleuritic in the left lower part of her chest that was later attributed to muscle spasm and improved following adjustment of pain medications.  Most recently seen in office by cardiology in July 2024, was doing well at that time, plan for repeat carotid duplex in December 2024.  Presents independently, appears stable from a  cardiovascular standpoint.  Describes orthopnea requiring the need to sleep with 3 pillows elevating her at night, states this has been normal for her for years.  Reports at least an 8 pound weight gain over the last 8 months, patient states she is unsure of why, attributes it to stopping her Mounjaro  1 week ago.  States at a previous preoperative cardiovascular evaluation in the remote past she was diagnosed with congestive heart failure requiring IV diuresis resulting in 27 pounds of fluid being pulled off her.  Unable to find this admission on her chart as it may have happened prior to implementing our EMR system.  Denies chest pain, SOB at rest, palpitations, dizziness, near-syncope, changes to stool/urine, edema.  Reports her physical activity is limited by her chronic back pain, states she tries to do arm exercises and walk the [place] of the grocery store while pushing the cart.  Denies any difficulties with ADLs or carrying in bags of heavy groceries.  States she is unsure if she could walk a block on a flat ground or climb a flight of stairs secondary to her back pain, has not performed this activity in a while.  ROS:   Please see the history of present illness.    All other systems reviewed and are negative.     Past Medical History:  Diagnosis Date   Anemia    sickle cell trait   Anxiety    Arthritis    knees (10/12/2013)   CHF (congestive heart failure) (HCC)    Chronic back pain    Chronic bronchitis (HCC)    got it q yr when I lived in [place]   Complication of anesthesia  hard to put under   COPD (chronic obstructive pulmonary disease) (HCC)    Dysrhythmia    Family history of anesthesia complication    sister hard to wake up   Family history of anesthesia complication    niece have itching   Fibromyalgia    GERD (gastroesophageal reflux disease)    Hyperlipidemia    Hypertension    Irregular heart beat    Migraine    used to get them alot; not regular anymore  (10/12/2013)   PAD (peripheral artery disease)    Pituitary tumor    followed at [facility]; on cabergoline  09/2013   Pneumonia    twice   Type II diabetes mellitus [facility]     Past Surgical History:  Procedure Laterality Date   ABDOMINAL AORTAGRAM N/A 06/28/2012   Procedure: ABDOMINAL EZELLA;  Surgeon: Erick JONELLE Bergamo, MD;  Location: [facility] CATH LAB;  Service: Cardiovascular;  Laterality: N/A;   APPENDECTOMY     BACK SURGERY     x 2, lower back   BILATERAL CARPAL TUNNEL RELEASE     CARDIAC CATHETERIZATION     CATARACT EXTRACTION Left 2023   CATARACT EXTRACTION Right 2023   CESAREAN SECTION  1974; 1976; 1978   CHOLECYSTECTOMY N/A 10/12/2013   Procedure: LAPAROSCOPIC CHOLECYSTECTOMY WITH INTRAOPERATIVE CHOLANGIOGRAM;  Surgeon: Donnice POUR. Belinda, MD;  Location: [facility] OR;  Service: General;  Laterality: N/A;   COLONOSCOPY     FEMORAL ARTERY STENT Left 06/28/2012   SFA   LAPAROSCOPIC CHOLECYSTECTOMY  10/12/2013   LEFT HEART CATHETERIZATION WITH CORONARY ANGIOGRAM N/A 06/28/2012   Procedure: LEFT HEART CATHETERIZATION WITH CORONARY ANGIOGRAM;  Surgeon: Erick JONELLE Bergamo, MD;  Location: [facility] CATH LAB;  Service: Cardiovascular;  Laterality: N/A;   LOWER EXTREMITY ANGIOGRAM N/A 06/28/2012   Procedure: LOWER EXTREMITY ANGIOGRAM;  Surgeon: Erick JONELLE Bergamo, MD;  Location: [facility] CATH LAB;  Service: Cardiovascular;  Laterality: N/A;   PERIPHERAL VASCULAR CATHETERIZATION N/A 09/10/2015   Procedure: Abdominal Aortogram w/Lower Extremity;  Surgeon: Gordy Bergamo, MD;  Location: [facility] INVASIVE CV LAB;  Service: Cardiovascular;  Laterality: N/A;   POSTERIOR LUMBAR FUSION     REPLACEMENT TOTAL KNEE Left    TONSILLECTOMY     TUBAL LIGATION  1978    Current Medications: Current Meds  Medication Sig   albuterol  (PROVENTIL ) (2.5 MG/3ML) 0.083% nebulizer solution Take 2.5 mg by nebulization every 6 (six) hours as needed for wheezing or shortness of breath.   albuterol  (VENTOLIN  HFA) 108 (90 Base)  MCG/ACT inhaler Inhale 1 puff into the lungs every 6 (six) hours as needed for wheezing or shortness of breath. ProAir    budesonide -formoterol  (SYMBICORT ) 160-4.5 MCG/ACT inhaler Inhale 2 puffs into the lungs 2 (two) times daily.   cabergoline  (DOSTINEX ) 0.5 MG tablet Take 0.5 tablets (0.25 mg total) by mouth 2 (two) times a week.   cetirizine -pseudoephedrine  (ZYRTEC -D) 5-120 MG tablet Take 1 tablet by mouth daily.   clopidogrel  (PLAVIX ) 75 MG tablet TAKE 1 TABLET(75 MG) BY MOUTH DAILY   dapagliflozin  propanediol (FARXIGA ) 10 MG TABS tablet Take 1 tablet (10 mg total) by mouth daily.   diclofenac Sodium (VOLTAREN) 1 % GEL Apply topically.   ferrous sulfate  325 (65 FE) MG tablet Take 325 mg by mouth daily with breakfast.    Insulin  Lispro Prot & Lispro (HUMALOG  MIX 75/25 KWIKPEN) (75-25) 100 UNIT/ML Kwikpen Inject 8 Units into the skin in the morning and at bedtime.   Insulin  Pen Needle 32G X 4 MM MISC 1 Device by Does  not apply route in the morning and at bedtime.   lisinopril -hydrochlorothiazide  (PRINZIDE ,ZESTORETIC ) 20-12.5 MG per tablet Take 1 tablet by mouth daily.    lubiprostone (AMITIZA) 24 MCG capsule Take 24 mcg by mouth 2 (two) times daily with a meal.   mometasone  (NASONEX ) 50 MCG/ACT nasal spray Place 2 sprays into the nose as needed.   Multiple Vitamin (MULTIVITAMIN) capsule Take 1 capsule by mouth daily.   naproxen (NAPROSYN) 250 MG tablet Take 250 mg by mouth 2 (two) times daily.   Olopatadine  HCl 0.2 % SOLN Apply 1 drop to eye daily. (Patient taking differently: Apply 1 drop to eye as needed.)   oxyCODONE -acetaminophen  (PERCOCET) 10-325 MG tablet Take 1 tablet by mouth every 4 (four) hours as needed.   pregabalin  (LYRICA ) 200 MG capsule Take 200 mg by mouth 3 (three) times daily.   promethazine  (PHENERGAN ) 25 MG tablet Take 25 mg by mouth every 6 (six) hours as needed for nausea or vomiting.    rosuvastatin  (CRESTOR ) 40 MG tablet Take 1 tablet (40 mg total) by mouth daily.    SPIRIVA  RESPIMAT 2.5 MCG/ACT AERS INHALE 2 PUFFS INTO THE LUNGS DAILY   temazepam (RESTORIL) 7.5 MG capsule Take 7.5 mg by mouth daily.   tirzepatide  (MOUNJARO ) 10 MG/0.5ML Pen Inject 10 mg into the skin once a week.   tiZANidine  (ZANAFLEX ) 4 MG tablet Take 4 mg by mouth 3 (three) times daily as needed.   Vitamin D, Ergocalciferol, (DRISDOL) 50000 UNITS CAPS capsule Take 50,000 Units by mouth every Tuesday.     Allergies:   Coconut (cocos nucifera)   Social History   Socioeconomic History   Marital status: Divorced    Spouse name: Not on file   Number of children: 3   Years of education: 16   Highest education level: Not on file  Occupational History    Comment: retired [employer]  Tobacco Use   Smoking status: Former    Current packs/day: 0.00    Average packs/day: 0.5 packs/day for 46.0 years (23.0 ttl pk-yrs)    Types: Cigarettes    Start date: 06/20/2000    Quit date: 09/29/2015    Years since quitting: 8.5   Smokeless tobacco: Never  Vaping Use   Vaping status: Never Used  Substance and Sexual Activity   Alcohol use: No    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   Drug use: No   Sexual activity: Not Currently  Other Topics Concern   Not on file  Social History Narrative   Lives with daughter    caffeine- none   Social Drivers of Corporate Investment Banker Strain: Not on file  Food Insecurity: Not on file  Transportation Needs: Not on file  Physical Activity: Not on file  Stress: Not on file  Social Connections: Unknown (09/20/2021)   Received from [employer]   Social Network    Social Network: Not on file     Family History: The patient's family history includes Breast cancer in her niece; Diabetes in her mother; Kidney cancer in her sister; Kidney disease in her brother and mother; Liver cancer in her brother; Lymphoma in her brother; Pancreatic cancer in her brother, father, and sister. There is no history of Colon cancer, Inflammatory bowel disease, Esophageal  cancer, Liver disease, Rectal cancer, Stomach cancer, or Ovarian cancer.  EKGs/Labs/Other Studies Reviewed:    The following studies were reviewed today:  EKG Interpretation Date/Time:  Tuesday April 11 2024 09:07:58 EST Ventricular Rate:  89 PR  Interval:  182 QRS Duration:  144 QT Interval:  420 QTC Calculation: 511 R Axis:   46  Text Interpretation: Normal sinus rhythm Left bundle branch block Amplitude decrease in lateral leads TWI improved in inferolateral leads compared to prior study No significant change since last tracing Confirmed by Zoye Chandra [phone]) on 04/11/2024 9:12:24 AM    Recent Labs: 12/21/2023: BUN 13; Creat 1.27; Potassium 4.6; Sodium 140; TSH 3.01  Recent Lipid Panel    Component Value Date/Time   CHOL 119 12/21/2023 1332   CHOL 121 12/04/2022 1434   TRIG 178 (H) 12/21/2023 1332   HDL 34 (L) 12/21/2023 1332   HDL 26 (L) 12/04/2022 1434   CHOLHDL 3.5 12/21/2023 1332   VLDL 31.6 12/30/2021 1331   LDLCALC 60 12/21/2023 1332   LDLDIRECT 76.0 06/25/2021 1404      Physical Exam:    VS:  BP 114/62   Pulse 92   Ht 5' 2 (1.575 m)   Wt 208 lb 14.4 oz (94.8 kg)   SpO2 96%   BMI 38.21 kg/m        Wt Readings from Last 3 Encounters:  04/11/24 208 lb 14.4 oz (94.8 kg)  04/03/24 201 lb (91.2 kg)  03/03/24 197 lb (89.4 kg)     GEN:  Well nourished, well developed in no acute distress HEENT: Normal NECK: JVD present; No carotid bruits CARDIAC:  S1-S2 normal, RRR, no murmurs, rubs, gallops RESPIRATORY: Coarse crackles present predominantly in bilateral lower posterior lobes but extend into the mid-back, no wheezing or rhonchi  MUSCULOSKELETAL: Moderate nonpitting edema in bilateral lower extremities; No deformity  SKIN: Warm and dry NEUROLOGIC:  Alert and oriented x 3 PSYCHIATRIC:  Normal affect       Assessment & Plan Preoperative cardiovascular examination Orthopnea Chronic heart failure with preserved ejection fraction (HFpEF)  (HCC) According to the Revised Cardiac Risk Index (RCRI), her Perioperative Risk of Major Cardiac Event is (%): 11  Her Functional Capacity in METs is: 4.06 according to the Duke Activity Status Index (DASI). Given the patient's prior history of asymptomatic congestive heart failure, recent significant weight gain, orthopnea, abnormal lung sounds on exam, JVD, lower extremity edema, the patient will require further evaluation.  Not clear for surgery at this time.  Will order urgent chest x-ray, echo, BNP and reevaluate.  Will need to be cautious with outpatient diuresis given low normal BP on exam.  Patient understands and is agreeable to this plan. Dr. Elmira stated we could follow office protocol in regards to Plavix  hold for the surgery once clearance is obtained.  Follow-up after testing          Medication Adjustments/Labs and Tests Ordered: Current medicines are reviewed at length with the patient today.  Concerns regarding medicines are outlined above.  Orders Placed This Encounter  Procedures   DG Chest 2 View   Brain natriuretic peptide   EKG 12-Lead   ECHOCARDIOGRAM COMPLETE   No orders of the defined types were placed in this encounter.   Patient Instructions  Medication Instructions:  Continue your current medications.   *If you need a refill on your cardiac medications before your next appointment, please call your pharmacy*  Lab Work: Your physician recommends that you return for lab work today: BNP If you have labs (blood work) drawn today and your tests are completely normal, you will receive your results only by: MyChart Message (if you have MyChart) OR A paper copy in the mail If you have any  lab test that is abnormal or we need to change your treatment, we will call you to review the results.  Testing/Procedures: A chest x-ray takes a picture of the organs and structures inside the chest, including the heart, lungs, and blood vessels. This test can show  several things, including, whether the heart is enlarges; whether fluid is building up in the lungs; and whether pacemaker / defibrillator leads are still in place.   Your physician has requested that you have an echocardiogram. Echocardiography is a painless test that uses sound waves to create images of your heart. It provides your doctor with information about the size and shape of your heart and how well your heart's chambers and valves are working. This procedure takes approximately one hour. There are no restrictions for this procedure. Please do NOT wear cologne, perfume, aftershave, or lotions (deodorant is allowed). Please arrive 15 minutes prior to your appointment time.  Please note: We ask at that you not bring children with you during ultrasound (echo/ vascular) testing. Due to room size and safety concerns, children are not allowed in the ultrasound rooms during exams. Our front office staff cannot provide observation of children in our lobby area while testing is being conducted. An adult accompanying a patient to their appointment will only be allowed in the ultrasound room at the discretion of the ultrasound technician under special circumstances. We apologize for any inconvenience.   Follow-Up: At [facility], you and your health needs are our priority.  As part of our continuing mission to provide you with exceptional heart care, our providers are all part of one team.  This team includes your primary Cardiologist (physician) and Advanced Practice Providers or APPs (Physician Assistants and Nurse Practitioners) who all work together to provide you with the care you need, when you need it.  Your next appointment:   After echocardiogram  Provider:   Newman JINNY Lawrence, MD or Advanced Practice Provider   We recommend signing up for the patient portal called MyChart.  Sign up information is provided on this After Visit Summary.  MyChart is used to connect with patients  for Virtual Visits (Telemedicine).  Patients are able to view lab/test results, encounter notes, upcoming appointments, etc.  Non-urgent messages can be sent to your provider as well.   To learn more about what you can do with MyChart, go to [email or web address].   Other Instructions  Clearance for your upcoming surgery will be based on this testing.   If you have significant worsening in your breathing before next appointment, please present to urgent care or the emergency room for evaluation.    Signed, Miriam FORBES Shams, NP  04/11/2024 12:57 PM    [facility]

## 2024-04-11 ENCOUNTER — Ambulatory Visit (HOSPITAL_COMMUNITY)
Admission: RE | Admit: 2024-04-11 | Discharge: 2024-04-11 | Disposition: A | Discharge status: Home / Self Care | Source: Ambulatory Visit | Attending: Emergency Medicine | Admitting: Emergency Medicine

## 2024-04-11 ENCOUNTER — Encounter (HOSPITAL_BASED_OUTPATIENT_CLINIC_OR_DEPARTMENT_OTHER): Payer: Self-pay | Admitting: Emergency Medicine

## 2024-04-11 ENCOUNTER — Ambulatory Visit (INDEPENDENT_AMBULATORY_CARE_PROVIDER_SITE_OTHER): Admitting: Family

## 2024-04-11 ENCOUNTER — Ambulatory Visit (INDEPENDENT_AMBULATORY_CARE_PROVIDER_SITE_OTHER)

## 2024-04-11 VITALS — BP 114/62 | HR 92 | Ht 62.0 in | Wt 208.9 lb

## 2024-04-11 DIAGNOSIS — I5032 Chronic diastolic (congestive) heart failure: Secondary | ICD-10-CM

## 2024-04-11 DIAGNOSIS — I10 Essential (primary) hypertension: Secondary | ICD-10-CM | POA: Diagnosis not present

## 2024-04-11 DIAGNOSIS — Z01810 Encounter for preprocedural cardiovascular examination: Secondary | ICD-10-CM | POA: Diagnosis not present

## 2024-04-11 DIAGNOSIS — R0601 Orthopnea: Secondary | ICD-10-CM | POA: Insufficient documentation

## 2024-04-11 DIAGNOSIS — R0609 Other forms of dyspnea: Secondary | ICD-10-CM

## 2024-04-11 DIAGNOSIS — R0600 Dyspnea, unspecified: Secondary | ICD-10-CM | POA: Diagnosis not present

## 2024-04-11 DIAGNOSIS — R600 Localized edema: Secondary | ICD-10-CM

## 2024-04-11 LAB — ECHOCARDIOGRAM COMPLETE
Area-P 1/2: 4.46 cm2
Height: 62 in
MV VTI: 2.75 cm2
S' Lateral: 4 cm
Weight: 3342.4 [oz_av]

## 2024-04-11 NOTE — Telephone Encounter (Signed)
-----   Message from [place], [place] sent at 04/10/2024  5:26 PM EST ----- Please let the patient know her endometrial biopsy was benign, which is good. We will still have the pathologists look at the mass and the uterus at the time of the surgery to help guide if we need  to do any additional steps at the time of the surgery. ----- Message ----- From: Interface, Lab In [facility] Sent: 04/05/2024   1:57 PM EST To: Hoy Masters, MD

## 2024-04-11 NOTE — Telephone Encounter (Signed)
 Spoke with patient and relayed message from Dr. Eldonna. -patient's endometrial biopsy was benign, which is good. We will still have the pathologists look at the mass and the uterus at the time of the surgery to help guide if we need to do any additional steps at the time of the surgery. Pt verbalized understanding and thanked the office for calling.

## 2024-04-11 NOTE — Telephone Encounter (Addendum)
 Attempted to reach out to Ms.Delores regarding the endometrial biopsy results as reported by Eleanor Epps NP. Unable to reach pt d/t voicemail is full. I will try again.

## 2024-04-11 NOTE — Patient Instructions (Addendum)
 Medication Instructions:  Continue your current medications.   *If you need a refill on your cardiac medications before your next appointment, please call your pharmacy*  Lab Work: Your physician recommends that you return for lab work today: BNP If you have labs (blood work) drawn today and your tests are completely normal, you will receive your results only by: MyChart Message (if you have MyChart) OR A paper copy in the mail If you have any lab test that is abnormal or we need to change your treatment, we will call you to review the results.  Testing/Procedures: A chest x-ray takes a picture of the organs and structures inside the chest, including the heart, lungs, and blood vessels. This test can show several things, including, whether the heart is enlarges; whether fluid is building up in the lungs; and whether pacemaker / defibrillator leads are still in place.   Your physician has requested that you have an echocardiogram. Echocardiography is a painless test that uses sound waves to create images of your heart. It provides your doctor with information about the size and shape of your heart and how well your heart's chambers and valves are working. This procedure takes approximately one hour. There are no restrictions for this procedure. Please do NOT wear cologne, perfume, aftershave, or lotions (deodorant is allowed). Please arrive 15 minutes prior to your appointment time.  Please note: We ask at that you not bring children with you during ultrasound (echo/ vascular) testing. Due to room size and safety concerns, children are not allowed in the ultrasound rooms during exams. Our front office staff cannot provide observation of children in our lobby area while testing is being conducted. An adult accompanying a patient to their appointment will only be allowed in the ultrasound room at the discretion of the ultrasound technician under special circumstances. We apologize for any  inconvenience.   Follow-Up: At [facility], you and your health needs are our priority.  As part of our continuing mission to provide you with exceptional heart care, our providers are all part of one team.  This team includes your primary Cardiologist (physician) and Advanced Practice Providers or APPs (Physician Assistants and Nurse Practitioners) who all work together to provide you with the care you need, when you need it.  Your next appointment:   After echocardiogram  Provider:   Newman JINNY Lawrence, MD or Advanced Practice Provider   We recommend signing up for the patient portal called MyChart.  Sign up information is provided on this After Visit Summary.  MyChart is used to connect with patients for Virtual Visits (Telemedicine).  Patients are able to view lab/test results, encounter notes, upcoming appointments, etc.  Non-urgent messages can be sent to your provider as well.   To learn more about what you can do with MyChart, go to [email or web address].   Other Instructions  Clearance for your upcoming surgery will be based on this testing.   If you have significant worsening in your breathing before next appointment, please present to urgent care or the emergency room for evaluation.

## 2024-04-11 NOTE — Telephone Encounter (Signed)
 2nd attempt to reach Ms.Grimmer by phone regarding the endometrial biopsy results

## 2024-04-12 ENCOUNTER — Encounter: Payer: Self-pay | Admitting: Emergency Medicine

## 2024-04-12 ENCOUNTER — Other Ambulatory Visit (HOSPITAL_COMMUNITY): Payer: Self-pay

## 2024-04-12 ENCOUNTER — Ambulatory Visit: Payer: Self-pay | Admitting: Emergency Medicine

## 2024-04-12 ENCOUNTER — Telehealth: Payer: Self-pay | Admitting: Emergency Medicine

## 2024-04-12 DIAGNOSIS — I5032 Chronic diastolic (congestive) heart failure: Secondary | ICD-10-CM

## 2024-04-12 DIAGNOSIS — Z01810 Encounter for preprocedural cardiovascular examination: Secondary | ICD-10-CM

## 2024-04-12 MED ORDER — FUROSEMIDE 40 MG PO TABS
40.0000 mg | ORAL_TABLET | Freq: Every day | ORAL | 3 refills | Status: DC
  Filled 2024-04-12: qty 90, 90d supply, fill #0

## 2024-04-12 NOTE — Telephone Encounter (Addendum)
 Patient had cardiology appt for clearance and requires extra testing   Spoke with Altamese at Hadassah Alt, NP office, they are working on the form

## 2024-04-12 NOTE — Patient Instructions (Addendum)
 SURGICAL WAITING ROOM VISITATION Patients having surgery or a procedure may have no more than 2 support people in the waiting area - these visitors may rotate in the visitor waiting room.   If the patient needs to stay at the hospital during part of their recovery, the visitor guidelines for inpatient rooms apply.  PRE-OP VISITATION  Pre-op nurse will coordinate an appropriate time for 1 support person to accompany the patient in pre-op.  This support person may not rotate.  This visitor will be contacted when the time is appropriate for the visitor to come back in the pre-op area.  Please refer to the [facility] website for the visitor guidelines for Inpatients (after your surgery is over and you are in a regular room).  You are not required to quarantine at this time prior to your surgery. However, you must do this: Hand Hygiene often Do NOT share personal items Notify your provider if you are in close contact with someone who has COVID or you develop fever 100.4 or greater, new onset of sneezing, cough, sore throat, shortness of breath or body aches.  If you test positive for Covid or have been in contact with anyone that has tested positive in the last 10 days please notify you surgeon.    Your procedure is scheduled on:  Tuesday  April 25, 2024  Report to [facility] Main Entrance: [address] entrance where the [employer] is available.   Report to admitting at: 12:15  PM  Call this number if you have any questions or problems the morning of surgery [phone]  FOLLOW ANY ADDITIONAL PRE OP INSTRUCTIONS YOU RECEIVED FROM YOUR SURGEON'S OFFICE!!!  Eat a light diet the day before surgery.  Examples including soups, broths, toast, yogurt, mashed potatoes.  AVOID GAS PRODUCING FOODS. Things to avoid include carbonated beverages (fizzy beverages, sodas), raw fruits and raw vegetables (uncooked), or beans.    How to Manage Your Diabetes Before and After Surgery  Why is it  important to control my blood sugar before and after surgery? Improving blood sugar levels before and after surgery helps healing and can limit problems. A way of improving blood sugar control is eating a healthy diet by:  Eating less sugar and carbohydrates  Increasing activity/exercise  Talking with your doctor about reaching your blood sugar goals High blood sugars (greater than 180 mg/dL) can raise your risk of infections and slow your recovery, so you will need to focus on controlling your diabetes during the weeks before surgery. Make sure that the doctor who takes care of your diabetes knows about your planned surgery including the date and location.  How do I manage my blood sugar before surgery? Check your blood sugar at least 4 times a day, starting 2 days before surgery, to make sure that the level is not too high or low. Check your blood sugar the morning of your surgery when you wake up and every 2 hours until you get to the Short Stay unit. If your blood sugar is less than 70 mg/dL, you will need to treat for low blood sugar: Do not take insulin . Treat a low blood sugar (less than 70 mg/dL) with  cup of clear juice (cranberry or apple), 4 glucose tablets, OR glucose gel. Recheck blood sugar in 15 minutes after treatment (to make sure it is greater than 70 mg/dL). If your blood sugar is not greater than 70 mg/dL on recheck, call [phone] for further instructions. Report your blood sugar to  the short stay nurse when you get to Short Stay.  If you are admitted to the hospital after surgery: Your blood sugar will be checked by the staff and you will probably be given insulin  after surgery (instead of oral diabetes medicines) to make sure you have good blood sugar levels. The goal for blood sugar control after surgery is 80-180 mg/dL.   WHAT DO I DO ABOUT MY DIABETES MEDICATION?  MOUNJARO - Stop injections for 7 days before your surgery. Last injection will be on: before Monday  04-17-2024  FARXIGA -  Stop taking 72 hours before your surgery. Last dose will be taken on Friday 04-21-2024  Insulin  Lispro 75/25 - contact your PCP or Endocrinologist regarding dosage the day before and the day of your surgery.     IF you have any questions, call the nurse at [phone]     Do not eat food after Midnight the night prior to your surgery/procedure.  After Midnight you may have the following liquids until   11:30 AM DAY OF SURGERY  Clear Liquid Diet Water  Black Coffee (sugar ok, NO MILK/CREAM OR CREAMERS)  Tea (sugar ok, NO MILK/CREAM OR CREAMERS) regular and decaf                             Plain Jell-O  with no fruit (NO RED)                                           Fruit ices (not with fruit pulp, NO RED)                                     Popsicles (NO RED)                                                                  Juice: NO CITRUS JUICES: only apple, WHITE grape, WHITE cranberry Sports drinks like Gatorade or Powerade (NO RED)                Oral Hygiene is also important to reduce your risk of infection.        Remember - BRUSH YOUR TEETH THE MORNING OF SURGERY WITH YOUR REGULAR TOOTHPASTE  Do NOT smoke after Midnight the night before surgery.  STOP TAKING all Vitamins, Herbs and supplements 1 week before your surgery.   PLAVIX  -  Hold 5 days before your surgery.  Last dose will be taken on Wednesday  04-19-2024  Take ONLY these medicines the morning of surgery with A SIP OF WATER :  Oxycodone  APAP, Pregabalin , Cabergoline . You may use your Eye drops and nasal spray.  You may use your Albuterol  inhaler if needed. Please bring your Inhaler with you on the day of your surgery.    DO NOT TAKE Furosemide , Lubiprostone (Amitiza), and Lisinopril - HCTZ on the day of your surgery.                    You may not have any metal on your body including hair pins, jewelry, and  body piercing  Do not wear make-up, lotions, powders, perfumes or deodorant  Do not  wear nail polish including gel and S&S, artificial / acrylic nails, or any other type of covering on natural nails including finger and toenails. If you have artificial nails, gel coating, etc., that needs to be removed by a nail salon, Please have this removed prior to surgery. Not doing so may mean that your surgery could be cancelled or delayed if the Surgeon or anesthesia staff feels like they are unable to monitor you safely.   Do not shave 48 hours prior to surgery to avoid nicks in your skin which may contribute to postoperative infections.   Contacts, Hearing Aids, dentures or bridgework may not be worn into surgery. DENTURES WILL BE REMOVED PRIOR TO SURGERY PLEASE DO NOT APPLY Poly grip OR ADHESIVES!!!  You may bring a small overnight bag with you on the day of surgery, only pack items that are not valuable. [facility] IS NOT RESPONSIBLE   FOR VALUABLES THAT ARE LOST OR STOLEN.   Do not bring your home medications to the hospital. The Pharmacy will dispense medications listed on your medication list to you during your admission in the Hospital.   Please read over the following fact sheets you were given: IF YOU HAVE QUESTIONS ABOUT YOUR PRE-OP INSTRUCTIONS, PLEASE CALL [phone]   [facility] - Preparing for Surgery        Before surgery, you can play an important role.  Because skin is not sterile, your skin needs to be as free of germs as possible.  You can reduce the number of germs on your skin by washing with CHG (chlorahexidine gluconate) soap before surgery.  CHG is an antiseptic cleaner which kills germs and bonds with the skin to continue killing germs even after washing. Please DO NOT use if you have an allergy to CHG or antibacterial soaps.  If your skin becomes reddened/irritated stop using the CHG and inform your nurse when you arrive at Short Stay. Do not shave (including legs and underarms) for at least 48 hours prior to the first CHG shower.  You may shave your  face/neck.  Please follow these instructions carefully:  1.  Shower with CHG Soap the night before surgery ONLY (DO NOT USE THE CHG SOAP THE MORNING OF SURGERY).  2.  If you choose to wash your hair, wash your hair first as usual with your normal  shampoo.  3.  After you shampoo, rinse your hair and body thoroughly to remove the shampoo.                             4.  Use CHG as you would any other liquid soap.  You can apply chg directly to the skin and wash.  Gently with a scrungie or clean washcloth.  5.  Apply the CHG Soap to your body ONLY FROM THE NECK DOWN.   Do not use on face/ open                           Wound or open sores. Avoid contact with eyes, ears mouth and genitals (private parts).                       Wash face,  Genitals (private parts) with your normal soap.  6.  Wash thoroughly, paying special attention to the area where your  surgery  will be performed.  7.  Thoroughly rinse your body with warm water  from the neck down.  8.  DO NOT shower/wash with your normal soap after using and rinsing off the CHG Soap.                9.  Pat yourself dry with a clean towel.            10.  Wear clean pajamas.            11.  Place clean sheets on your bed the night of your first shower and do not  sleep with pets.  Day of Surgery : Do not apply any CHG, lotions/deodorants the morning of surgery.  Please wear clean clothes to the hospital/surgery center.   FAILURE TO FOLLOW THESE INSTRUCTIONS MAY RESULT IN THE CANCELLATION OF YOUR SURGERY  PATIENT SIGNATURE_________________________________  NURSE SIGNATURE__________________________________  ________________________________________________________________________

## 2024-04-12 NOTE — Progress Notes (Signed)
 Error

## 2024-04-12 NOTE — Telephone Encounter (Signed)
 Discussed case and echo results with patient's primary cardiologist, Dr. Elmira. We will plan to diureses patient with lasix  daily and have her follow up next week for re-evaluation with updated BMP at that time. Due to newly reduced EF (40-45%), will plan for ischemic rule out. Will determine cCTA vs PET stress based on kidney function next week. I called and discussed the plan with the patient, who stated she agreed and understood. Will forward note to requesting surgeon's office.

## 2024-04-12 NOTE — Telephone Encounter (Signed)
 Rosaline with Gynecology/Oncology with [facility] is calling on clearance form - form is scanned in as of 04/03/24 in media tab. Rosaline would like status on form.  Phone #: [phone]

## 2024-04-12 NOTE — Telephone Encounter (Signed)
 N/a

## 2024-04-12 NOTE — Progress Notes (Signed)
 COVID Vaccine received:  []  No [x]  Yes Date of any COVID positive Test in last 90 days:  PCP - Santana Molt, NP  Cardiologist - Newman Lawrence, MD, Miriam Shams, NP   Pain Mgmt- [facility] Curtistine Biles, NP  Chest x-ray -  EKG -  04-11-2024 Stress Test -  ECHO - 04-11-24  Epic Cardiac Cath - 2014  L[facility] by Dr. Ladona CT Coronary Calcium  score:   Pacemaker / ICD device [x]  No []  Yes   Spinal Cord Stimulator:[x]  No []  Yes       History of Sleep Apnea? [x]  No []  Yes   CPAP used?- [x]  No []  Yes    Medication on DOS: Oxycodone  APAP, Pregabalin , Cabergoline , Eye drops, Albuterol , nasal spray  Hold DOS:  Furosemide , Lubiprostone (Amitiza),   Lisinopril - HCTZ  Patient has: []  NO Hx DM   []  Pre-DM   []  DM1  [x]   DM2 Does the patient monitor blood sugar?   []  N/A   []  No [x]  Yes  Last A1c was:   5.5  on  03-03-24    Does patient have a [employer] or Dexcom? [x]  No []  Yes   Fasting Blood Sugar Ranges- 90-100 Checks Blood Sugar 3 times a day  MOUNJARO -  hold x 7 days   Injects on Fridays (stopped 2 weeks) FARXIGA -  hold x 72 hours  Insulin  Lispro 75/25   Blood Thinner / Instructions: Plavix    hold x 5 days per patient  Aspirin  Instructions:  none  Activity level: Able to walk up 2 flights of stairs without becoming significantly short of breath or having chest pain?  []  No   []    Yes  Patient can perform ADLs without assistance. []  No   []   Yes  Comments:   Anesthesia review: HTN, IDDM, PAD, anemia, CKD3, LBBB, CHF, atrial tachycardia, GERD, COPD  hard to put under anesthesia, CPS- Long term opiates  Patient denies any S&S of respiratory illness or Covid - no shortness of breath, fever, cough or chest pain at PAT appointment.  Patient verbalized understanding and agreement to the Pre-Surgical Instructions that were given to them at this PAT appointment. Patient was also educated of the need to review these PAT instructions again prior to her surgery.I reviewed the  appropriate phone numbers to call if they have any and questions or concerns.

## 2024-04-12 NOTE — Progress Notes (Signed)
 New drop in LVEF, likely heart failure symptoms with leg edema.  We need to optimize her before surgery. Patient has multiple risk factors for CAD. I GFR >45, recommend CCTA, if <45, recommend lexziscan nuclear stress test, Agree with Lasix . Recommend f/u in 1-2 weeks, before scheduled surgery date of 12/9, to reassess her symptoms.   Thanks MJP

## 2024-04-13 LAB — BRAIN NATRIURETIC PEPTIDE: BNP: 64 pg/mL (ref 0.0–100.0)

## 2024-04-14 ENCOUNTER — Other Ambulatory Visit: Payer: Self-pay | Admitting: Physician Assistant

## 2024-04-14 ENCOUNTER — Encounter (HOSPITAL_COMMUNITY)
Admission: RE | Admit: 2024-04-14 | Discharge: 2024-04-14 | Disposition: A | Discharge status: Home / Self Care | Source: Ambulatory Visit | Attending: Psychiatry | Admitting: Psychiatry

## 2024-04-14 ENCOUNTER — Other Ambulatory Visit: Payer: Self-pay | Admitting: Cardiology

## 2024-04-14 ENCOUNTER — Encounter (HOSPITAL_COMMUNITY): Payer: Self-pay

## 2024-04-14 ENCOUNTER — Other Ambulatory Visit (HOSPITAL_COMMUNITY): Payer: Self-pay

## 2024-04-14 ENCOUNTER — Other Ambulatory Visit: Payer: Self-pay

## 2024-04-14 DIAGNOSIS — Z01818 Encounter for other preprocedural examination: Secondary | ICD-10-CM | POA: Diagnosis present

## 2024-04-14 DIAGNOSIS — N838 Other noninflammatory disorders of ovary, fallopian tube and broad ligament: Secondary | ICD-10-CM | POA: Insufficient documentation

## 2024-04-14 DIAGNOSIS — Z01812 Encounter for preprocedural laboratory examination: Secondary | ICD-10-CM | POA: Diagnosis not present

## 2024-04-14 HISTORY — DX: Personal history of urinary calculi: Z87.442

## 2024-04-14 LAB — CBC
HCT: 37 % (ref 36.0–46.0)
Hemoglobin: 11.6 g/dL — ABNORMAL LOW (ref 12.0–15.0)
MCH: 30.9 pg (ref 26.0–34.0)
MCHC: 31.4 g/dL (ref 30.0–36.0)
MCV: 98.4 fL (ref 80.0–100.0)
Platelets: 252 K/uL (ref 150–400)
RBC: 3.76 MIL/uL — ABNORMAL LOW (ref 3.87–5.11)
RDW: 16.3 % — ABNORMAL HIGH (ref 11.5–15.5)
WBC: 8.8 K/uL (ref 4.0–10.5)
nRBC: 0 % (ref 0.0–0.2)

## 2024-04-14 LAB — COMPREHENSIVE METABOLIC PANEL WITH GFR
ALT: 8 U/L (ref 0–44)
AST: 15 U/L (ref 15–41)
Albumin: 3.7 g/dL (ref 3.5–5.0)
Alkaline Phosphatase: 67 U/L (ref 38–126)
Anion gap: 8 (ref 5–15)
BUN: 10 mg/dL (ref 8–23)
CO2: 28 mmol/L (ref 22–32)
Calcium: 9.4 mg/dL (ref 8.9–10.3)
Chloride: 106 mmol/L (ref 98–111)
Creatinine, Ser: 1.27 mg/dL — ABNORMAL HIGH (ref 0.44–1.00)
GFR, Estimated: 45 mL/min — ABNORMAL LOW (ref >60)
Glucose, Bld: 63 mg/dL — ABNORMAL LOW (ref 70–99)
Potassium: 4.1 mmol/L (ref 3.5–5.1)
Sodium: 142 mmol/L (ref 135–145)
Total Bilirubin: <0.2 mg/dL (ref 0.0–1.2)
Total Protein: 6.9 g/dL (ref 6.5–8.1)

## 2024-04-14 LAB — TYPE AND SCREEN
ABO/RH(D): O POS
Antibody Screen: NEGATIVE

## 2024-04-14 MED ORDER — FUROSEMIDE 40 MG PO TABS: 40.0000 mg | ORAL_TABLET | Freq: Every day | ORAL | 1 refills | Status: AC

## 2024-04-17 ENCOUNTER — Ambulatory Visit: Payer: Self-pay

## 2024-04-17 ENCOUNTER — Ambulatory Visit: Attending: Physician Assistant | Admitting: Emergency Medicine

## 2024-04-17 ENCOUNTER — Encounter: Payer: Self-pay | Admitting: Emergency Medicine

## 2024-04-17 VITALS — BP 128/66 | HR 76 | Ht 62.0 in | Wt 205.2 lb

## 2024-04-17 DIAGNOSIS — Z01810 Encounter for preprocedural cardiovascular examination: Secondary | ICD-10-CM | POA: Diagnosis not present

## 2024-04-17 DIAGNOSIS — I5020 Unspecified systolic (congestive) heart failure: Secondary | ICD-10-CM

## 2024-04-17 MED ORDER — CLOPIDOGREL BISULFATE 75 MG PO TABS: 75.0000 mg | ORAL_TABLET | Freq: Every day | ORAL | 3 refills | Status: DC

## 2024-04-17 NOTE — Telephone Encounter (Signed)
 Received pulm clearance

## 2024-04-17 NOTE — Patient Instructions (Signed)
 Medication Instructions:  NO CHANGES *If you need a refill on your cardiac medications before your next appointment, please call your pharmacy*  Lab Work: [facility] ON 04/18/24 OR 04/19/24 If you have labs (blood work) drawn today and your tests are completely normal, you will receive your results only by: MyChart Message (if you have MyChart) OR A paper copy in the mail If you have any lab test that is abnormal or we need to change your treatment, we will call you to review the results.  Testing/Procedures: NO TESTING  Follow-Up: At [facility], you and your health needs are our priority.  As part of our continuing mission to provide you with exceptional heart care, our providers are all part of one team.  This team includes your primary Cardiologist (physician) and Advanced Practice Providers or APPs (Physician Assistants and Nurse Practitioners) who all work together to provide you with the care you need, when you need it.  Your next appointment:   1 month(s)  Provider:   Newman JINNY Lawrence, MD

## 2024-04-17 NOTE — Telephone Encounter (Signed)
-----   Message from Eleanor JONETTA Epps sent at 04/17/2024  8:41 AM EST ----- Please fax results to PCP ----- Message ----- From: Rebecka, Lab In [place] Sent: 04/14/2024  12:06 PM EST To: Eleanor JONETTA Epps, NP

## 2024-04-17 NOTE — Telephone Encounter (Signed)
 Per Eleanor Epps NP, recent lab results faxed to PCP

## 2024-04-17 NOTE — Progress Notes (Unsigned)
 Cardiology Office Note:    Date:  04/17/2024   ID:  Christina Solis, DOB 05/21/1950, MRN [id number]  PCP:  Joshua Santana CROME, NP   [facility] Providers Cardiologist:  Newman JINNY Lawrence, MD { Click to update primary MD,subspecialty MD or APP then REFRESH:1}    Referring MD: Joshua Santana CROME, NP   Chief complaint: 1 week follow up of volume overload     History of Present Illness:   Christina Solis is a 73 y.o. female with a hx of congestive heart failure with preserved ejection fraction, hypertension, hyperlipidemia, prior tobacco use, moderate coronary artery stenosis, PAD s/p left SFA stenting around 2014, T2DM presenting today for preoperative cardiac clearance for laparoscopic hysterectomy, bilateral salpingo-oophorectomy, possible lymph node dissection biopsy scheduled for 04/25/2024.   Presented to [facility] cardiology in 2018 following complaints of chest pain and tightness, echo was ordered showing LVEF 55-60%, moderate focal basal hypertrophy, possible hypokinesis of the mid anteroseptal myocardium, G1 DD, trivial aortic regurgitation, mildly dilated right ventricle with normal wall thickness, trivial tricuspid regurgitation, mild pulmonic regurgitation. Patient was lost to follow-up until 2021, where she saw Dr. Lawrence for her peripheral vascular disease.  ABIs 02/2020 revealed moderately decreased perfusion of the right lower extremity, noted at the [facility] and PT arterial level on the right and normal perfusion of the LLE. 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  Seen in his [place] 10/2021 with complaints of chest pain, ACS was ruled out, pain was pleuritic in the left lower part of her chest that was later attributed to muscle spasm and improved following adjustment of pain medications.  Most recently seen in office by cardiology in July 2024, was doing well at that time, plan for repeat carotid duplex in  December 2024.  Most recently seen by myself on 04/11/2024, patient appeared volume overloaded at that time with crackles throughout posterior lung fields, edema, JVD.  Chest x-ray showing pulmonary vascular congestion.  Echo showed septal dyssynchrony due to LBBB, EF 40-45%, mildly decreased LV function, no RWMA, mild LVH, indeterminate diastolic parameters, normal RV, LA mildly dilated, trivial MV regurg, normal AV, RA pressure 3 mmHg. 40 mg Lasix  was ordered daily, requested patient come back to see me in 1 week for follow-up. Per Dr. Lawrence, New drop in LVEF, likely heart failure symptoms with leg edema. We need to optimize her before surgery. Patient has multiple risk factors for CAD. I GFR >45, recommend CCTA, if <45, recommend lexziscan nuclear stress test, Agree with Lasix . Recommend f/u in 1-2 weeks, before scheduled surgery date of 12/9, to reassess her symptoms.  Presents independently, appears improved from a cardiac standpoint.  Reports she has lost 3 pounds since starting the Lasix , and realizes she does feel better overall. She denies chest pain, palpitations, dyspnea, n, v, dark/tarry/bloody stools, dizziness, syncope, edema, weight gain.  We spent the majority of the visit discussing what the test findings indicate, and what the plan will be moving forward.  ROS:   Please see the history of present illness.    All other systems reviewed and are negative.     Past Medical History:  Diagnosis Date   Anemia    sickle cell trait   Anxiety    Arthritis    knees (10/12/2013)   CHF (congestive heart failure) (HCC)    Chronic back pain    Chronic bronchitis (HCC)    got it q yr  when I lived in [place]   Complication of anesthesia    hard to put under   COPD (chronic obstructive pulmonary disease) (HCC)    Dysrhythmia    Family history of anesthesia complication    sister hard to wake up   Family history of anesthesia complication    niece have itching   Fibromyalgia     GERD (gastroesophageal reflux disease)    History of kidney stones    Hyperlipidemia    Hypertension    Irregular heart beat    Migraine    used to get them alot; not regular anymore (10/12/2013)   PAD (peripheral artery disease)    Pituitary tumor    followed at [facility]; on cabergoline  09/2013   Pneumonia    twice   Type II diabetes mellitus (HCC)     Past Surgical History:  Procedure Laterality Date   ABDOMINAL AORTAGRAM N/A 06/28/2012   Procedure: ABDOMINAL EZELLA;  Surgeon: Erick JONELLE Bergamo, MD;  Location: [facility] CATH LAB;  Service: Cardiovascular;  Laterality: N/A;   APPENDECTOMY     BACK SURGERY     x 2, lower back   BILATERAL CARPAL TUNNEL RELEASE     CARDIAC CATHETERIZATION     CARPAL TUNNEL RELEASE Right 12/2022   CATARACT EXTRACTION Left 2023   CATARACT EXTRACTION Right 2023   CESAREAN SECTION  1974; 1976; 1978   CHOLECYSTECTOMY N/A 10/12/2013   Procedure: LAPAROSCOPIC CHOLECYSTECTOMY WITH INTRAOPERATIVE CHOLANGIOGRAM;  Surgeon: Donnice POUR. Belinda, MD;  Location: [facility] OR;  Service: General;  Laterality: N/A;   COLONOSCOPY     FEMORAL ARTERY STENT Left 06/28/2012   SFA   LAPAROSCOPIC CHOLECYSTECTOMY  10/12/2013   LEFT HEART CATHETERIZATION WITH CORONARY ANGIOGRAM N/A 06/28/2012   Procedure: LEFT HEART CATHETERIZATION WITH CORONARY ANGIOGRAM;  Surgeon: Erick JONELLE Bergamo, MD;  Location: [facility] CATH LAB;  Service: Cardiovascular;  Laterality: N/A;   LOWER EXTREMITY ANGIOGRAM N/A 06/28/2012   Procedure: LOWER EXTREMITY ANGIOGRAM;  Surgeon: Erick JONELLE Bergamo, MD;  Location: [facility] CATH LAB;  Service: Cardiovascular;  Laterality: N/A;   PERIPHERAL VASCULAR CATHETERIZATION N/A 09/10/2015   Procedure: Abdominal Aortogram w/Lower Extremity;  Surgeon: Gordy Bergamo, MD;  Location: [facility] INVASIVE CV LAB;  Service: Cardiovascular;  Laterality: N/A;   POSTERIOR LUMBAR FUSION     REPLACEMENT TOTAL KNEE Left    TONSILLECTOMY     TUBAL LIGATION  1978    Current  Medications: Current Meds  Medication Sig   albuterol  (PROVENTIL ) (2.5 MG/3ML) 0.083% nebulizer solution Take 2.5 mg by nebulization every 6 (six) hours as needed for wheezing or shortness of breath.   albuterol  (VENTOLIN  HFA) 108 (90 Base) MCG/ACT inhaler Inhale 1 puff into the lungs every 6 (six) hours as needed for wheezing or shortness of breath. ProAir    budesonide -formoterol  (SYMBICORT ) 160-4.5 MCG/ACT inhaler Inhale 2 puffs into the lungs daily as needed (wheezing/sob).   cabergoline  (DOSTINEX ) 0.5 MG tablet Take 0.5 tablets (0.25 mg total) by mouth 2 (two) times a week.   dapagliflozin  propanediol (FARXIGA ) 10 MG TABS tablet Take 1 tablet (10 mg total) by mouth daily.   diclofenac Sodium (VOLTAREN) 1 % GEL Apply 1 Application topically daily as needed (pain).   ferrous sulfate  325 (65 FE) MG tablet Take 325 mg by mouth daily.   furosemide  (LASIX ) 40 MG tablet Take 1 tablet (40 mg total) by mouth daily.   Insulin  Lispro Prot & Lispro (HUMALOG  MIX 75/25 KWIKPEN) (75-25) 100 UNIT/ML Kwikpen Inject 8 Units into the skin in  the morning and at bedtime.   Insulin  Pen Needle 32G X 4 MM MISC 1 Device by Does not apply route in the morning and at bedtime.   lisinopril -hydrochlorothiazide  (PRINZIDE ,ZESTORETIC ) 20-12.5 MG per tablet Take 1 tablet by mouth daily.    lubiprostone (AMITIZA) 24 MCG capsule Take 24 mcg by mouth every other day.   mometasone  (NASONEX ) 50 MCG/ACT nasal spray Place 2 sprays into the nose as needed (allergies).   Multiple Vitamin (MULTIVITAMIN) capsule Take 1 capsule by mouth daily.   naproxen (NAPROSYN) 250 MG tablet Take 250 mg by mouth daily as needed for mild pain (pain score 1-3) or moderate pain (pain score 4-6).   nortriptyline (PAMELOR) 25 MG capsule Take 25 mg by mouth at bedtime.   Olopatadine  HCl 0.2 % SOLN Apply 1 drop to eye daily. (Patient taking differently: Place 1 drop into both eyes daily.)   oxyCODONE -acetaminophen  (PERCOCET) 10-325 MG tablet Take 1 tablet  by mouth in the morning, at noon, in the evening, and at bedtime.   pregabalin  (LYRICA ) 200 MG capsule Take 200 mg by mouth 3 (three) times daily.   promethazine  (PHENERGAN ) 25 MG tablet Take 25 mg by mouth every 6 (six) hours as needed for nausea or vomiting.    rosuvastatin  (CRESTOR ) 40 MG tablet Take 1 tablet (40 mg total) by mouth daily. (Patient taking differently: Take 40 mg by mouth every evening.)   SPIRIVA  RESPIMAT 2.5 MCG/ACT AERS INHALE 2 PUFFS INTO THE LUNGS DAILY (Patient taking differently: Inhale 2 puffs into the lungs daily as needed (wheezing/sob).)   temazepam (RESTORIL) 7.5 MG capsule Take 7.5 mg by mouth at bedtime.   tirzepatide  (MOUNJARO ) 10 MG/0.5ML Pen Inject 10 mg into the skin once a week. (Patient taking differently: Inject 10 mg into the skin once a week. Fridays)   tiZANidine  (ZANAFLEX ) 4 MG tablet Take 4 mg by mouth 3 (three) times daily.   Vitamin D, Ergocalciferol, (DRISDOL) 50000 UNITS CAPS capsule Take 50,000 Units by mouth every Tuesday.   [DISCONTINUED] clopidogrel  (PLAVIX ) 75 MG tablet TAKE 1 TABLET(75 MG) BY MOUTH DAILY     Allergies:   Coconut (cocos nucifera)   Social History   Socioeconomic History   Marital status: Divorced    Spouse name: Not on file   Number of children: 3   Years of education: 16   Highest education level: Not on file  Occupational History    Comment: retired [employer]  Tobacco Use   Smoking status: Former    Current packs/day: 0.00    Average packs/day: 0.5 packs/day for 46.0 years (23.0 ttl pk-yrs)    Types: Cigarettes    Start date: 06/20/2000    Quit date: 09/29/2015    Years since quitting: 8.5   Smokeless tobacco: Never  Vaping Use   Vaping status: Never Used  Substance and Sexual Activity   Alcohol use: No    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   Drug use: No   Sexual activity: Not Currently  Other Topics Concern   Not on file  Social History Narrative   Lives with daughter    caffeine- none   Social Drivers of  Corporate Investment Banker Strain: Not on file  Food Insecurity: Not on file  Transportation Needs: Not on file  Physical Activity: Not on file  Stress: Not on file  Social Connections: Unknown (09/20/2021)   Received from [employer]   Social Network    Social Network: Not on file  Family History: The patient's family history includes Breast cancer in her niece; Diabetes in her mother; Kidney cancer in her sister; Kidney disease in her brother and mother; Liver cancer in her brother; Lymphoma in her brother; Pancreatic cancer in her brother, father, and sister. There is no history of Colon cancer, Inflammatory bowel disease, Esophageal cancer, Liver disease, Rectal cancer, Stomach cancer, or Ovarian cancer.  EKGs/Labs/Other Studies Reviewed:    The following studies were reviewed today:       Recent Labs: 12/21/2023: TSH 3.01 04/11/2024: BNP 64.0 04/14/2024: ALT 8; BUN 10; Creatinine, Ser 1.27; Hemoglobin 11.6; Platelets 252; Potassium 4.1; Sodium 142  Recent Lipid Panel    Component Value Date/Time   CHOL 119 12/21/2023 1332   CHOL 121 12/04/2022 1434   TRIG 178 (H) 12/21/2023 1332   HDL 34 (L) 12/21/2023 1332   HDL 26 (L) 12/04/2022 1434   CHOLHDL 3.5 12/21/2023 1332   VLDL 31.6 12/30/2021 1331   LDLCALC 60 12/21/2023 1332   LDLDIRECT 76.0 06/25/2021 1404     Physical Exam:    VS:  BP 128/66 (BP Location: Right Arm, Patient Position: Sitting, Cuff Size: Normal)   Pulse 76   Ht 5' 2 (1.575 m)   Wt 205 lb 3.2 oz (93.1 kg)   SpO2 94%   BMI 37.53 kg/m        Wt Readings from Last 3 Encounters:  04/17/24 205 lb 3.2 oz (93.1 kg)  04/14/24 209 lb 7 oz (95 kg)  04/11/24 208 lb 14.4 oz (94.8 kg)     GEN:  Well nourished, well developed in no acute distress HEENT: Normal NECK: No JVD; No carotid bruits CARDIAC:  S1-S2 normal, RRR, no murmurs, rubs, gallops RESPIRATORY:  Faint crackles to posterior LLL clearing w/ deep breath, otherwise clear  throughout. MUSCULOSKELETAL:  No edema; No deformity  SKIN: Warm and dry NEUROLOGIC:  Alert and oriented x 3 PSYCHIATRIC:  Normal affect       Assessment & Plan HFrEF (heart failure with reduced ejection fraction) (HCC) Preoperative cardiovascular examination Echo showed septal dyssynchrony due to LBBB, EF 40-45%, mildly decreased LV function, no RWMA, mild LVH, indeterminate diastolic parameters, normal RV, LA mildly dilated, trivial MV regurg, normal AV, RA pressure 3 mmHg. Chest x-ray showing pulmonary vascular congestion. Following the addition of 40 mg of Lasix  daily, patient states she has lost 3 pounds. Reports she feels better overall, states her grandson told her he noticed she was wheezing less. Denies chest pain, SOB, palpitations, near-syncope, edema JVD, lower extremity edema, and crackles throughout posterior lung fields have all resolved in comparison to when I evaluated her last week. However, given finding of new reduced EF with no prior workup for previous LBBB, patient will require an ischemic evaluation prior to her surgery. Recent presurgical lab work was performed 3 days after starting Lasix . Will plan to repeat BMP tomorrow, after having been on Lasix  X 1 week. If labs performed tomorrow show a decline in kidney function (GFR <45, Crt 1.4 or above), CCTA will need to be canceled, and patient scheduled for urgent PET scan, with likely delay in surgery in an effort to preserve kidney function.  If kidney function remains stable, patient can proceed with her CCTA on Friday. If patient has CCTA on Friday that shows nonobstructive disease, she may proceed with the moderate risk surgery as a high risk patient (RCRI 11%, Functional METs 4.06). If CCTA shows obstructive disease, will need further evaluation prior to proceeding with surgery.  Continue medications as prescribed for now, may need to adjust or eliminate lasix  if kidney function shows decline.   Follow up with  cardiology in 1 month           Medication Adjustments/Labs and Tests Ordered: Current medicines are reviewed at length with the patient today.  Concerns regarding medicines are outlined above.  Orders Placed This Encounter  Procedures   Basic metabolic panel with GFR   Meds ordered this encounter  Medications   clopidogrel  (PLAVIX ) 75 MG tablet    Sig: Take 1 tablet (75 mg total) by mouth daily.    Dispense:  90 tablet    Refill:  3    Please schedule an appointment for additional refills    Patient Instructions  Medication Instructions:  NO CHANGES *If you need a refill on your cardiac medications before your next appointment, please call your pharmacy*  Lab Work: [facility] ON 04/18/24 OR 04/19/24 If you have labs (blood work) drawn today and your tests are completely normal, you will receive your results only by: MyChart Message (if you have MyChart) OR A paper copy in the mail If you have any lab test that is abnormal or we need to change your treatment, we will call you to review the results.  Testing/Procedures: NO TESTING  Follow-Up: At [facility], you and your health needs are our priority.  As part of our continuing mission to provide you with exceptional heart care, our providers are all part of one team.  This team includes your primary Cardiologist (physician) and Advanced Practice Providers or APPs (Physician Assistants and Nurse Practitioners) who all work together to provide you with the care you need, when you need it.  Your next appointment:   1 month(s)  Provider:   Newman JINNY Lawrence, MD   Signed, Miriam FORBES Shams, NP  04/17/2024 6:21 PM    [facility]

## 2024-04-18 ENCOUNTER — Telehealth: Payer: Self-pay | Admitting: *Deleted

## 2024-04-18 ENCOUNTER — Telehealth: Payer: Self-pay | Admitting: Emergency Medicine

## 2024-04-18 ENCOUNTER — Other Ambulatory Visit: Payer: Self-pay | Admitting: Emergency Medicine

## 2024-04-18 DIAGNOSIS — I5032 Chronic diastolic (congestive) heart failure: Secondary | ICD-10-CM

## 2024-04-18 DIAGNOSIS — Z01810 Encounter for preprocedural cardiovascular examination: Secondary | ICD-10-CM

## 2024-04-18 NOTE — Telephone Encounter (Signed)
 Called to check in and see if patient was able to get labwork performed. She stated she was planning on going tomorrow. Reminded her that we cannot have CTA performed until lab work has been resulted, as if her kidney function is elevated we will need to perform a different procedure. Patient states she understands and will not proceed with CTA until she discusses labs with me.

## 2024-04-18 NOTE — Telephone Encounter (Signed)
 Spoke with patient and relayed message that her surgery has been moved to 05/02/24 to allow more time to complete additional cardiac testing if needed. Christina Solis verbalized understanding and thanked the office for calling.

## 2024-04-20 LAB — BASIC METABOLIC PANEL WITH GFR
BUN/Creatinine Ratio: 27 (ref 12–28)
BUN: 28 mg/dL — ABNORMAL HIGH (ref 8–27)
CO2: 25 mmol/L (ref 20–29)
Calcium: 9.3 mg/dL (ref 8.7–10.3)
Chloride: 104 mmol/L (ref 96–106)
Creatinine, Ser: 1.05 mg/dL — ABNORMAL HIGH (ref 0.57–1.00)
Glucose: 99 mg/dL (ref 70–99)
Potassium: 4.3 mmol/L (ref 3.5–5.2)
Sodium: 143 mmol/L (ref 134–144)
eGFR: 57 mL/min/1.73 — ABNORMAL LOW (ref >59)

## 2024-04-21 ENCOUNTER — Telehealth (HOSPITAL_BASED_OUTPATIENT_CLINIC_OR_DEPARTMENT_OTHER): Payer: Self-pay | Admitting: Family

## 2024-04-21 ENCOUNTER — Ambulatory Visit: Payer: Self-pay | Admitting: Emergency Medicine

## 2024-04-21 ENCOUNTER — Ambulatory Visit (HOSPITAL_COMMUNITY)
Admission: RE | Admit: 2024-04-21 | Discharge: 2024-04-21 | Disposition: A | Source: Ambulatory Visit | Attending: Emergency Medicine

## 2024-04-21 DIAGNOSIS — Z01810 Encounter for preprocedural cardiovascular examination: Secondary | ICD-10-CM

## 2024-04-21 DIAGNOSIS — I5032 Chronic diastolic (congestive) heart failure: Secondary | ICD-10-CM

## 2024-04-21 MED ORDER — NITROGLYCERIN 0.4 MG SL SUBL: 0.8000 mg | SUBLINGUAL_TABLET | Freq: Once | SUBLINGUAL | Status: DC

## 2024-04-21 MED ORDER — DILTIAZEM HCL 25 MG/5ML IV SOLN
10.0000 mg | INTRAVENOUS | Status: DC | PRN
  Administered 2024-04-21: 5 mg via INTRAVENOUS

## 2024-04-21 MED ORDER — IOHEXOL 350 MG/ML SOLN: 100.0000 mL | Freq: Once | INTRAVENOUS | Status: DC | PRN

## 2024-04-21 MED ORDER — METOPROLOL TARTRATE 100 MG PO TABS: ORAL_TABLET | ORAL | 0 refills | Status: DC

## 2024-04-21 NOTE — Telephone Encounter (Signed)
 As she is feeling well on Lasix , opted to continue.  Did not get pre-meds for CCTA this morning and had caffeine, likely cause of elevated HR. She has metoprolol  tartrate to take before her CCTA on Monday.  Recommend follow up as scheduled. If CCTA unremarkable could provide cardiac clearance in result note. Let me know if you have questions!  Valene Villa S Kyle Luppino, NP

## 2024-04-21 NOTE — Progress Notes (Signed)
 Called patient and updated her that surgery is on 05/02/24. She needs to be at [facility] at 0830. She can have clear liquids until 0745. Plavix  last dose 04/26/24 at 0800. Do not take Mounjaro  after 04/24/24.

## 2024-04-21 NOTE — Telephone Encounter (Addendum)
 Discussed with Kenzie Campbell, NP. Volume status on Lasix  40mg  daily was improving at most recent office visit. Labs 04/19/24  with improving kidney function, normal electrolytes. CCTA had to be rescheduled due to elevated HR and is upcoming 04/25/24. She did not take pre-meds prior to CCTA today.   Will ask nursing team to call: Please ensure tolerating Lasix  40mg  daily Please ask if having any palpitations or worsening swelling/edema The morning of CCTA take Metoprolol  Tartrate 100mg  tablet two hours prior to procedure. Sent to [facility]. Please make her aware to pick up and take as directed. Skip Lasix  the day of CCTA  Reche GORMAN Finder, NP

## 2024-04-21 NOTE — Telephone Encounter (Signed)
 Returned a call back to the pt.   Pt is tolerating lasix  40 mg daily well, and reports her swelling has resolved.  Pt states her palpitations have improved, and currently doing well from a cardiac standpoint.   Pt aware  she will need to go and pick up metoprolol  tartrate 100 mg and take this 2 hours prior to her Cardiac CT (at 1115) on Tuesday 12/9.  Pt also aware to skip her lasix  on that day.  Went over entire cardiac CT instructions with her on the phone, and pt verbalized understanding.   Pt is aware we sent the metoprolol  100 mg to her pharmacy [facility].   Pt verbalized understanding and agrees with this plan.  Pt was more than gracious for all the assistance provided.   Will make Reche Finder, NP aware of this update.

## 2024-04-24 ENCOUNTER — Telehealth (HOSPITAL_COMMUNITY): Payer: Self-pay | Admitting: *Deleted

## 2024-04-24 NOTE — Telephone Encounter (Signed)
Attempted to call patient regarding upcoming cardiac CT appointment. Voicemail box full.  Larey Brick RN Navigator Cardiac Imaging [facility] [phone] Office [phone] Cell

## 2024-04-25 ENCOUNTER — Ambulatory Visit (HOSPITAL_COMMUNITY): Admission: RE | Admit: 2024-04-25 | Discharge: 2024-04-25 | Attending: Emergency Medicine

## 2024-04-25 ENCOUNTER — Encounter (HOSPITAL_COMMUNITY): Payer: Self-pay

## 2024-04-25 ENCOUNTER — Other Ambulatory Visit (HOSPITAL_COMMUNITY): Payer: Self-pay | Admitting: *Deleted

## 2024-04-25 DIAGNOSIS — N838 Other noninflammatory disorders of ovary, fallopian tube and broad ligament: Secondary | ICD-10-CM

## 2024-04-25 MED ORDER — METOPROLOL TARTRATE 50 MG PO TABS: ORAL_TABLET | ORAL | 0 refills | Status: DC

## 2024-04-25 MED ORDER — SODIUM CHLORIDE 0.9 % IV SOLN: Freq: Once | INTRAVENOUS | Status: AC

## 2024-04-25 MED ORDER — IOHEXOL 350 MG/ML SOLN: 100.0000 mL | Freq: Once | INTRAVENOUS | Status: DC | PRN

## 2024-04-25 NOTE — Progress Notes (Signed)
 Patient presented to her cardiac CT on 04/25/24. Her BP was in the 90s systolic after taking 100mg  metoprolol  but HR was about 75.  Dr. Michele ordered 1000ml of NaCl for the patient. After the infusion of IV NaCl, patient's HR remained in the 90's systolic.  Dr. Michele decided it was best to not proceed with the study and asked that she be rescheduled with no lasix  or lisinopril -hydrochlorothiazide  and 50mg  metoprolol  tartrate on her next attempt.  Patient was given verbal instructions and she verbalized understanding. Patient denies any symptoms and was able to ambulate out the department with a steady gait.

## 2024-04-28 ENCOUNTER — Ambulatory Visit (HOSPITAL_COMMUNITY)
Admission: RE | Admit: 2024-04-28 | Discharge: 2024-04-28 | Disposition: A | Source: Ambulatory Visit | Attending: Emergency Medicine

## 2024-04-28 MED ORDER — NITROGLYCERIN 0.4 MG SL SUBL
0.8000 mg | SUBLINGUAL_TABLET | Freq: Once | SUBLINGUAL | Status: AC
  Administered 2024-04-28: 0.8 mg via SUBLINGUAL

## 2024-04-28 MED ORDER — IOHEXOL 350 MG/ML SOLN
100.0000 mL | Freq: Once | INTRAVENOUS | Status: AC | PRN
  Administered 2024-04-28: 100 mL via INTRAVENOUS

## 2024-05-01 ENCOUNTER — Telehealth: Payer: Self-pay | Admitting: *Deleted

## 2024-05-01 NOTE — Telephone Encounter (Signed)
 Received cardio clearance

## 2024-05-01 NOTE — Progress Notes (Addendum)
 Case: [id number] Date/Time: 05/02/24 1031   Procedures:      HYSTERECTOMY, TOTAL, ROBOT-ASSISTED, LAPAROSCOPIC, WITH BILATERAL SALPINGO-OOPHORECTOMY - ROBOTIC ASSISTED, POSSIBLE STAGING     INJECTION, FOR SENTINEL LYMPH NODE IDENTIFICATION     LYMPH NODE BIOPSY     LYMPHADENECTOMY, PELVIS, ROBOT-ASSISTED   Anesthesia type: General   Diagnosis:      Ovarian mass [N83.8]     Thickened endometrium [R93.89]   Pre-op diagnosis: ovarian mass, thickened endometrium   Location: [facility] ROOM 05 / [facility]   Surgeons: [name], MD       DISCUSSION: Christina Solis is a 73 yo female with PMH of former smoking, HTN, diastolic and systolic CHF, LBBB, carotid artery stenosis, PAD s/p left SFA stenting (~2014), COPD, migraines, GERD, IDDM, CKD, anemia, arthritis, obesity (BMI 38).  Patient follows with cardiology for history of diastolic heart failure, hypertension.  Patient seen by Miriam Shams, NP on 11/25 for preop clearance.  Patient reported orthopnea and weight gain. CXR, echo, BNP ordered. CXR showed mild vascular congestion. BNP normal. Echo showed reduced EF to 40-45%, normal valves. Patient started on Lasix . Per Dr. Elmira:  We need to optimize her before surgery. Patient has multiple risk factors for CAD. I GFR >45, recommend CCTA, if <45, recommend lexziscan nuclear stress test, Agree with Lasix . Recommend f/u in 1-2 weeks, before scheduled surgery date of 12/9, to reassess her symptoms.  Patient seen again in clinic on 04/17/24. Symptomatically she was improved with Lasix . Per NP Shams:   If patient has CCTA on Friday that shows nonobstructive disease, she may proceed with the moderate risk surgery as a high risk patient (RCRI 11%, Functional METs 4.06). If CCTA shows obstructive disease, will need further evaluation prior to proceeding with surgery.  CCTA was done 12/12. Final report not available at time of review however preliminary read suggests nonobstructive disease. Per NP  Walker via secure chat patient is acceptable risk for planned procedure.  Hx of COPD. Uses inhalers. No recent flares  Hx of IDDM. Pre op A1c 5.5 on 03/03/24.   VS: BP 122/84   Pulse 78   Temp 37 C (Oral)   Resp 20   Ht 5' 2 (1.575 m)   Wt 95 kg   SpO2 98%   BMI 38.31 kg/m   PROVIDERS: Joshua Santana CROME, NP   LABS: Labs reviewed: Acceptable for surgery. CKD stable. (all labs ordered are listed, but only abnormal results are displayed)  Labs Reviewed  CBC - Abnormal; Notable for the following components:      Result Value   RBC 3.76 (*)    Hemoglobin 11.6 (*)    RDW 16.3 (*)    All other components within normal limits  COMPREHENSIVE METABOLIC PANEL WITH GFR - Abnormal; Notable for the following components:   Glucose, Bld 63 (*)    Creatinine, Ser 1.27 (*)    GFR, Estimated 45 (*)    All other components within normal limits  TYPE AND SCREEN     CXR 04/11/24:  FINDINGS: Heart is enlarged. Mild interstitial prominence. No pleural effusion or pneumothorax.   IMPRESSION: 1. Enlarged heart with central pulmonary vascular congestion.     CT Chest overread 04/28/24:   IMPRESSION: 1. No acute or focal abnormality. 2. 0.5 cm subpleural nodule left lower lobe is unchanged since 2021 and consistent with a benign lesion. 3. Aortic atherosclerosis.   Aortic Atherosclerosis (ICD10-I70.0).  EKG 04/11/2024:  Normal sinus rhythm Left bundle branch block Amplitude decrease in  lateral leads T WI improved on inferior lateral leads compared to prior study No significant change since last tracing   Echo 04/11/24:  IMPRESSIONS    1. Septal dyssynchrony due to LBBB; overall EF 40-45.  2. Left ventricular ejection fraction, by estimation, is 40 to 45%. The left ventricle has mildly decreased function. The left ventricle has no regional wall motion abnormalities. There is mild left ventricular hypertrophy. Left ventricular diastolic parameters are indeterminate.   3. Right ventricular systolic function is normal. The right ventricular size is normal.  4. Left atrial size was mildly dilated.  5. The mitral valve is normal in structure. Trivial mitral valve regurgitation. No evidence of mitral stenosis.  6. The aortic valve is tricuspid. Aortic valve regurgitation is not visualized. No aortic stenosis is present.  7. The inferior vena cava is normal in size with greater than 50% respiratory variability, suggesting right atrial pressure of 3 mmHg.  Past Medical History:  Diagnosis Date   Anemia    sickle cell trait   Anxiety    Arthritis    knees (10/12/2013)   CHF (congestive heart failure) (HCC)    Chronic back pain    Chronic bronchitis (HCC)    got it q yr when I lived in [place]   Complication of anesthesia    hard to put under   COPD (chronic obstructive pulmonary disease) (HCC)    Dysrhythmia    Family history of anesthesia complication    sister hard to wake up   Family history of anesthesia complication    niece have itching   Fibromyalgia    GERD (gastroesophageal reflux disease)    History of kidney stones    Hyperlipidemia    Hypertension    Irregular heart beat    Migraine    used to get them alot; not regular anymore (10/12/2013)   PAD (peripheral artery disease)    Pituitary tumor    followed at [facility]; on cabergoline  09/2013   Pneumonia    twice   Type II diabetes mellitus (HCC)     Past Surgical History:  Procedure Laterality Date   ABDOMINAL AORTAGRAM N/A 06/28/2012   Procedure: ABDOMINAL EZELLA;  Surgeon: Erick JONELLE Bergamo, MD;  Location: [facility] CATH LAB;  Service: Cardiovascular;  Laterality: N/A;   APPENDECTOMY     BACK SURGERY     x 2, lower back   BILATERAL CARPAL TUNNEL RELEASE     CARDIAC CATHETERIZATION     CARPAL TUNNEL RELEASE Right 12/2022   CATARACT EXTRACTION Left 2023   CATARACT EXTRACTION Right 2023   CESAREAN SECTION  1974; 1976; 1978   CHOLECYSTECTOMY N/A 10/12/2013    Procedure: LAPAROSCOPIC CHOLECYSTECTOMY WITH INTRAOPERATIVE CHOLANGIOGRAM;  Surgeon: Donnice POUR. Belinda, MD;  Location: [facility];  Service: General;  Laterality: N/A;   COLONOSCOPY     FEMORAL ARTERY STENT Left 06/28/2012   SFA   LAPAROSCOPIC CHOLECYSTECTOMY  10/12/2013   LEFT HEART CATHETERIZATION WITH CORONARY ANGIOGRAM N/A 06/28/2012   Procedure: LEFT HEART CATHETERIZATION WITH CORONARY ANGIOGRAM;  Surgeon: Erick JONELLE Bergamo, MD;  Location: [facility] CATH LAB;  Service: Cardiovascular;  Laterality: N/A;   LOWER EXTREMITY ANGIOGRAM N/A 06/28/2012   Procedure: LOWER EXTREMITY ANGIOGRAM;  Surgeon: Erick JONELLE Bergamo, MD;  Location: [facility] C[facility] LAB;  Service: Cardiovascular;  Laterality: N/A;   PERIPHERAL VASCULAR CATHETERIZATION N/A 09/10/2015   Procedure: Abdominal Aortogram w/Lower Extremity;  Surgeon: Gordy Bergamo, MD;  Location: [facility] INVASIVE CV LAB;  Service: Cardiovascular;  Laterality: N/A;  POSTERIOR LUMBAR FUSION     REPLACEMENT TOTAL KNEE Left    TONSILLECTOMY     TUBAL LIGATION  1978    MEDICATIONS:  albuterol  (PROVENTIL ) (2.5 MG/3ML) 0.083% nebulizer solution   albuterol  (VENTOLIN  HFA) 108 (90 Base) MCG/ACT inhaler   budesonide -formoterol  (SYMBICORT ) 160-4.5 MCG/ACT inhaler   cabergoline  (DOSTINEX ) 0.5 MG tablet   dapagliflozin  propanediol (FARXIGA ) 10 MG TABS tablet   diclofenac Sodium (VOLTAREN) 1 % GEL   ferrous sulfate  325 (65 FE) MG tablet   Insulin  Lispro Prot & Lispro (HUMALOG  MIX 75/25 KWIKPEN) (75-25) 100 UNIT/ML Kwikpen   Insulin  Pen Needle 32G X 4 MM MISC   lisinopril -hydrochlorothiazide  (PRINZIDE ,ZESTORETIC ) 20-12.5 MG per tablet   lubiprostone (AMITIZA) 24 MCG capsule   mometasone  (NASONEX ) 50 MCG/ACT nasal spray   Multiple Vitamin (MULTIVITAMIN) capsule   naproxen (NAPROSYN) 250 MG tablet   Olopatadine  HCl 0.2 % SOLN   oxyCODONE -acetaminophen  (PERCOCET) 10-325 MG tablet   pregabalin  (LYRICA ) 200 MG capsule   promethazine  (PHENERGAN ) 25 MG tablet   rosuvastatin  (CRESTOR ) 40 MG  tablet   SPIRIVA  RESPIMAT 2.5 MCG/ACT AERS   temazepam (RESTORIL) 7.5 MG capsule   tirzepatide  (MOUNJARO ) 10 MG/0.5ML Pen   tiZANidine  (ZANAFLEX ) 4 MG tablet   Vitamin D, Ergocalciferol, (DRISDOL) 50000 UNITS CAPS capsule   clopidogrel  (PLAVIX ) 75 MG tablet   furosemide  (LASIX ) 40 MG tablet   metoprolol  tartrate (LOPRESSOR ) 100 MG tablet   metoprolol  tartrate (LOPRESSOR ) 50 MG tablet   nortriptyline  (PAMELOR ) 25 MG capsule   No current facility-administered medications for this encounter.   Burnard CHRISTELLA Odis DEVONNA [facility] Short Stay/Anesthesiology [facility] Phone [phone] 05/01/2024 8:53 AM

## 2024-05-01 NOTE — Telephone Encounter (Signed)
 Attempted to reach patient for pre-op call. Unable to leave message -voicemail unavailable.

## 2024-05-01 NOTE — Anesthesia Preprocedure Evaluation (Addendum)
 Anesthesia Evaluation  Patient identified by MRN, date of birth, ID band Patient awake    Reviewed: Allergy & Precautions, NPO status , Patient's Chart, lab work & pertinent test results, reviewed documented beta blocker date and time   History of Anesthesia Complications (+) history of anesthetic complications (pt thinks she had awareness but doesnt know what surgery)  Airway Mallampati: III  TM Distance: >3 FB Neck ROM: Full    Dental  (+) Dental Advisory Given, Edentulous Upper, Edentulous Lower   Pulmonary COPD (only uses inhalers once a month),  COPD inhaler, former smoker Quit smoking 2017, 23 pack year history    Pulmonary exam normal breath sounds clear to auscultation       Cardiovascular hypertension (103/58 preop), Pt. on medications and Pt. on home beta blockers + CAD (nonobstructive), + Peripheral Vascular Disease and +CHF (LVEF 40-45%)  Normal cardiovascular exam Rhythm:Regular Rate:Normal  seen by Miriam Shams, NP on 11/25 for preop clearance.  Patient reported orthopnea and weight gain. CXR, echo, BNP ordered. CXR showed mild vascular congestion. BNP normal. Echo showed reduced EF to 40-45%, normal valves. Patient started on Lasix . Per Dr. Elmira:   We need to optimize her before surgery. Patient has multiple risk factors for CAD. I GFR >45, recommend CCTA, if <45, recommend lexziscan nuclear stress test, Agree with Lasix . Recommend f/u in 1-2 weeks, before scheduled surgery date of 12/9, to reassess her symptoms.   Patient seen again in clinic on 04/17/24. Symptomatically she was improved with Lasix . Per NP Shams:    If patient has CCTA on Friday that shows nonobstructive disease, she may proceed with the moderate risk surgery as a high risk patient (RCRI 11%, Functional METs 4.06). If CCTA shows obstructive disease, will need further evaluation prior to proceeding with surgery.   CCTA was done 12/12.  Final report not available at time of review however preliminary read suggests nonobstructive disease. Per NP Walker via secure chat patient is acceptable risk for planned procedure.    Neuro/Psych  Headaches PSYCHIATRIC DISORDERS Anxiety        GI/Hepatic Neg liver ROS,GERD  Controlled,,  Endo/Other  diabetes, Well Controlled, Type 2, Insulin  Dependent  Obesity BMI 38 A1c 5.5 FS 57 on arrival, treated with dextrose  No insulin  this AM or last night   Renal/GU Renal InsufficiencyRenal disease (cr 1.05)  negative genitourinary   Musculoskeletal  (+) Arthritis , Osteoarthritis,  Fibromyalgia -Prior back surgery, chronic percocet: 10/325 QID, took this AM Took zanaflex  this AM   Abdominal   Peds  Hematology negative hematology ROS (+)   Anesthesia Other Findings Plavix  LD: 7d Mounjaro  LD: 3 weeks  Reproductive/Obstetrics negative OB ROS                              Anesthesia Physical Anesthesia Plan  ASA: 3  Anesthesia Plan: General   Post-op Pain Management: Tylenol  PO (pre-op)*, Ketamine  IV* and Dilaudid  IV   Induction: Intravenous  PONV Risk Score and Plan: 3 and Ondansetron , Dexamethasone , Midazolam  and Treatment may vary due to age or medical condition  Airway Management Planned: Oral ETT  Additional Equipment: ClearSight  Intra-op Plan:   Post-operative Plan: Extubation in OR  Informed Consent: I have reviewed the patients History and Physical, chart, labs and discussed the procedure including the risks, benefits and alternatives for the proposed anesthesia with the patient or authorized representative who has indicated his/her understanding and acceptance.     Dental  advisory given  Plan Discussed with: CRNA  Anesthesia Plan Comments: (Awareness? BIS )         Anesthesia Quick Evaluation

## 2024-05-02 ENCOUNTER — Encounter (HOSPITAL_COMMUNITY): Payer: Self-pay | Admitting: Psychiatry

## 2024-05-02 ENCOUNTER — Inpatient Hospital Stay (HOSPITAL_COMMUNITY)
Admission: RE | Admit: 2024-05-02 | Discharge: 2024-05-06 | DRG: 734 | Disposition: A | Discharge status: Home / Self Care | Attending: Psychiatry | Admitting: Psychiatry

## 2024-05-02 ENCOUNTER — Encounter (HOSPITAL_COMMUNITY): Admission: RE | Disposition: A | Payer: Self-pay | Source: Home / Self Care | Attending: Psychiatry

## 2024-05-02 ENCOUNTER — Encounter (HOSPITAL_COMMUNITY): Payer: Self-pay | Admitting: Physician Assistant

## 2024-05-02 ENCOUNTER — Inpatient Hospital Stay (HOSPITAL_COMMUNITY): Admitting: Anesthesiology

## 2024-05-02 ENCOUNTER — Other Ambulatory Visit: Payer: Self-pay

## 2024-05-02 DIAGNOSIS — I130 Hypertensive heart and chronic kidney disease with heart failure and stage 1 through stage 4 chronic kidney disease, or unspecified chronic kidney disease: Secondary | ICD-10-CM | POA: Diagnosis present

## 2024-05-02 DIAGNOSIS — N841 Polyp of cervix uteri: Secondary | ICD-10-CM

## 2024-05-02 DIAGNOSIS — Z87891 Personal history of nicotine dependence: Secondary | ICD-10-CM

## 2024-05-02 DIAGNOSIS — Z803 Family history of malignant neoplasm of breast: Secondary | ICD-10-CM

## 2024-05-02 DIAGNOSIS — N183 Chronic kidney disease, stage 3 unspecified: Secondary | ICD-10-CM | POA: Diagnosis present

## 2024-05-02 DIAGNOSIS — F419 Anxiety disorder, unspecified: Secondary | ICD-10-CM | POA: Diagnosis present

## 2024-05-02 DIAGNOSIS — Z8419 Family history of other disorders of kidney and ureter: Secondary | ICD-10-CM

## 2024-05-02 DIAGNOSIS — Z96652 Presence of left artificial knee joint: Secondary | ICD-10-CM | POA: Diagnosis present

## 2024-05-02 DIAGNOSIS — I2510 Atherosclerotic heart disease of native coronary artery without angina pectoris: Secondary | ICD-10-CM

## 2024-05-02 DIAGNOSIS — Z7951 Long term (current) use of inhaled steroids: Secondary | ICD-10-CM

## 2024-05-02 DIAGNOSIS — D573 Sickle-cell trait: Secondary | ICD-10-CM | POA: Diagnosis present

## 2024-05-02 DIAGNOSIS — E1159 Type 2 diabetes mellitus with other circulatory complications: Secondary | ICD-10-CM

## 2024-05-02 DIAGNOSIS — Z01818 Encounter for other preprocedural examination: Secondary | ICD-10-CM

## 2024-05-02 DIAGNOSIS — M797 Fibromyalgia: Secondary | ICD-10-CM | POA: Diagnosis present

## 2024-05-02 DIAGNOSIS — Z800 Family history of malignant neoplasm of digestive organs: Secondary | ICD-10-CM

## 2024-05-02 DIAGNOSIS — E669 Obesity, unspecified: Secondary | ICD-10-CM | POA: Diagnosis present

## 2024-05-02 DIAGNOSIS — Z9851 Tubal ligation status: Secondary | ICD-10-CM

## 2024-05-02 DIAGNOSIS — Z833 Family history of diabetes mellitus: Secondary | ICD-10-CM

## 2024-05-02 DIAGNOSIS — K660 Peritoneal adhesions (postprocedural) (postinfection): Secondary | ICD-10-CM | POA: Diagnosis present

## 2024-05-02 DIAGNOSIS — C562 Malignant neoplasm of left ovary: Secondary | ICD-10-CM | POA: Diagnosis present

## 2024-05-02 DIAGNOSIS — Z9049 Acquired absence of other specified parts of digestive tract: Secondary | ICD-10-CM

## 2024-05-02 DIAGNOSIS — K429 Umbilical hernia without obstruction or gangrene: Secondary | ICD-10-CM | POA: Diagnosis present

## 2024-05-02 DIAGNOSIS — I10 Essential (primary) hypertension: Secondary | ICD-10-CM

## 2024-05-02 DIAGNOSIS — G8929 Other chronic pain: Secondary | ICD-10-CM | POA: Diagnosis present

## 2024-05-02 DIAGNOSIS — Z5331 Laparoscopic surgical procedure converted to open procedure: Secondary | ICD-10-CM | POA: Diagnosis not present

## 2024-05-02 DIAGNOSIS — E1151 Type 2 diabetes mellitus with diabetic peripheral angiopathy without gangrene: Secondary | ICD-10-CM | POA: Diagnosis present

## 2024-05-02 DIAGNOSIS — Z794 Long term (current) use of insulin: Secondary | ICD-10-CM

## 2024-05-02 DIAGNOSIS — M549 Dorsalgia, unspecified: Secondary | ICD-10-CM | POA: Diagnosis present

## 2024-05-02 DIAGNOSIS — N736 Female pelvic peritoneal adhesions (postinfective): Secondary | ICD-10-CM | POA: Diagnosis present

## 2024-05-02 DIAGNOSIS — N840 Polyp of corpus uteri: Secondary | ICD-10-CM | POA: Diagnosis present

## 2024-05-02 DIAGNOSIS — Z981 Arthrodesis status: Secondary | ICD-10-CM

## 2024-05-02 DIAGNOSIS — I499 Cardiac arrhythmia, unspecified: Secondary | ICD-10-CM | POA: Diagnosis not present

## 2024-05-02 DIAGNOSIS — Z7985 Long-term (current) use of injectable non-insulin antidiabetic drugs: Secondary | ICD-10-CM | POA: Diagnosis not present

## 2024-05-02 DIAGNOSIS — G8918 Other acute postprocedural pain: Secondary | ICD-10-CM

## 2024-05-02 DIAGNOSIS — Z79899 Other long term (current) drug therapy: Secondary | ICD-10-CM

## 2024-05-02 DIAGNOSIS — N838 Other noninflammatory disorders of ovary, fallopian tube and broad ligament: Secondary | ICD-10-CM | POA: Diagnosis present

## 2024-05-02 DIAGNOSIS — Z6837 Body mass index (BMI) 37.0-37.9, adult: Secondary | ICD-10-CM

## 2024-05-02 DIAGNOSIS — Z7902 Long term (current) use of antithrombotics/antiplatelets: Secondary | ICD-10-CM | POA: Diagnosis not present

## 2024-05-02 DIAGNOSIS — J4489 Other specified chronic obstructive pulmonary disease: Secondary | ICD-10-CM | POA: Diagnosis present

## 2024-05-02 DIAGNOSIS — N950 Postmenopausal bleeding: Secondary | ICD-10-CM | POA: Diagnosis present

## 2024-05-02 DIAGNOSIS — R1900 Intra-abdominal and pelvic swelling, mass and lump, unspecified site: Secondary | ICD-10-CM | POA: Diagnosis present

## 2024-05-02 DIAGNOSIS — E11649 Type 2 diabetes mellitus with hypoglycemia without coma: Secondary | ICD-10-CM | POA: Diagnosis present

## 2024-05-02 DIAGNOSIS — D631 Anemia in chronic kidney disease: Secondary | ICD-10-CM | POA: Diagnosis present

## 2024-05-02 DIAGNOSIS — N898 Other specified noninflammatory disorders of vagina: Secondary | ICD-10-CM | POA: Diagnosis present

## 2024-05-02 DIAGNOSIS — E785 Hyperlipidemia, unspecified: Secondary | ICD-10-CM | POA: Diagnosis present

## 2024-05-02 DIAGNOSIS — Z8051 Family history of malignant neoplasm of kidney: Secondary | ICD-10-CM

## 2024-05-02 DIAGNOSIS — R141 Gas pain: Secondary | ICD-10-CM

## 2024-05-02 DIAGNOSIS — Z79891 Long term (current) use of opiate analgesic: Secondary | ICD-10-CM

## 2024-05-02 DIAGNOSIS — Z807 Family history of other malignant neoplasms of lymphoid, hematopoietic and related tissues: Secondary | ICD-10-CM

## 2024-05-02 DIAGNOSIS — K219 Gastro-esophageal reflux disease without esophagitis: Secondary | ICD-10-CM | POA: Diagnosis present

## 2024-05-02 HISTORY — PX: ROBOTIC PELVIC AND PARA-AORTIC LYMPH NODE DISSECTION: SHX6210

## 2024-05-02 HISTORY — PX: ROBOTIC ASSISTED TOTAL HYSTERECTOMY WITH BILATERAL SALPINGO OOPHERECTOMY: SHX6086

## 2024-05-02 HISTORY — PX: OMENTECTOMY: SHX5985

## 2024-05-02 LAB — GLUCOSE, CAPILLARY
Glucose-Capillary: 57 mg/dL — ABNORMAL LOW (ref 70–99)
Glucose-Capillary: 108 mg/dL — ABNORMAL HIGH (ref 70–99)
Glucose-Capillary: 49 mg/dL — ABNORMAL LOW (ref 70–99)
Glucose-Capillary: 96 mg/dL (ref 70–99)
Glucose-Capillary: 171 mg/dL — ABNORMAL HIGH (ref 70–99)
Glucose-Capillary: 144 mg/dL — ABNORMAL HIGH (ref 70–99)
Glucose-Capillary: 145 mg/dL — ABNORMAL HIGH (ref 70–99)

## 2024-05-02 LAB — TYPE AND SCREEN
ABO/RH(D): O POS
Antibody Screen: NEGATIVE

## 2024-05-02 SURGERY — HYSTERECTOMY, TOTAL, ROBOT-ASSISTED, LAPAROSCOPIC, WITH BILATERAL SALPINGO-OOPHORECTOMY
Anesthesia: General

## 2024-05-02 MED ORDER — KETAMINE HCL 50 MG/5ML IJ SOSY
PREFILLED_SYRINGE | INTRAMUSCULAR | Status: AC
  Filled 2024-05-02: qty 5

## 2024-05-02 MED ORDER — FENTANYL CITRATE (PF) 100 MCG/2ML IJ SOLN
INTRAMUSCULAR | Status: DC | PRN
  Administered 2024-05-02: 12:00:00 100 ug via INTRAVENOUS

## 2024-05-02 MED ORDER — LIDOCAINE HCL 2 % IJ SOLN
INTRAMUSCULAR | Status: AC
  Filled 2024-05-02: qty 20

## 2024-05-02 MED ORDER — SUGAMMADEX SODIUM 200 MG/2ML IV SOLN
INTRAVENOUS | Status: AC
  Filled 2024-05-02: qty 2

## 2024-05-02 MED ORDER — ONDANSETRON HCL 4 MG/2ML IJ SOLN
4.0000 mg | Freq: Four times a day (QID) | INTRAMUSCULAR | Status: DC | PRN
  Administered 2024-05-03 – 2024-05-06 (×3): 4 mg via INTRAVENOUS
  Filled 2024-05-02 (×4): qty 2

## 2024-05-02 MED ORDER — SODIUM CHLORIDE (PF) 0.9 % IJ SOLN
INTRAMUSCULAR | Status: AC
  Filled 2024-05-02: qty 100

## 2024-05-02 MED ORDER — IPRATROPIUM-ALBUTEROL 0.5-2.5 (3) MG/3ML IN SOLN
3.0000 mL | Freq: Once | RESPIRATORY_TRACT | Status: AC
  Administered 2024-05-02: 19:00:00 3 mL via RESPIRATORY_TRACT

## 2024-05-02 MED ORDER — HYDROMORPHONE HCL 1 MG/ML IJ SOLN
INTRAMUSCULAR | Status: AC
  Filled 2024-05-02: qty 1

## 2024-05-02 MED ORDER — BUPIVACAINE LIPOSOME 1.3 % IJ SUSP
INTRAMUSCULAR | Status: DC | PRN
  Administered 2024-05-02: 18:00:00 20 mL

## 2024-05-02 MED ORDER — OXYCODONE HCL 5 MG/5ML PO SOLN: 5.0000 mg | Freq: Once | ORAL | Status: DC | PRN

## 2024-05-02 MED ORDER — PREGABALIN 100 MG PO CAPS
200.0000 mg | ORAL_CAPSULE | Freq: Three times a day (TID) | ORAL | Status: DC
  Administered 2024-05-02 – 2024-05-06 (×11): 200 mg via ORAL
  Filled 2024-05-02 (×11): qty 2

## 2024-05-02 MED ORDER — PROPOFOL 10 MG/ML IV BOLUS
INTRAVENOUS | Status: DC | PRN
  Administered 2024-05-02: 12:00:00 25 ug/kg/min via INTRAVENOUS
  Administered 2024-05-02: 12:00:00 200 mg via INTRAVENOUS

## 2024-05-02 MED ORDER — ONDANSETRON HCL 4 MG PO TABS
4.0000 mg | ORAL_TABLET | Freq: Four times a day (QID) | ORAL | Status: DC | PRN
  Administered 2024-05-04: 06:00:00 4 mg via ORAL
  Filled 2024-05-02: qty 1

## 2024-05-02 MED ORDER — INSULIN ASPART 100 UNIT/ML IJ SOLN
0.0000 [IU] | Freq: Every day | INTRAMUSCULAR | Status: DC
  Administered 2024-05-03: 22:00:00 2 [IU] via SUBCUTANEOUS
  Filled 2024-05-02: qty 2

## 2024-05-02 MED ORDER — BUPIVACAINE LIPOSOME 1.3 % IJ SUSP
INTRAMUSCULAR | Status: AC
  Filled 2024-05-02: qty 20

## 2024-05-02 MED ORDER — LACTATED RINGERS IR SOLN
Status: DC | PRN
  Administered 2024-05-02: 13:00:00 1000 mL

## 2024-05-02 MED ORDER — UMECLIDINIUM BROMIDE 62.5 MCG/ACT IN AEPB
1.0000 | INHALATION_SPRAY | Freq: Every day | RESPIRATORY_TRACT | Status: DC
  Administered 2024-05-03 – 2024-05-06 (×4): 1 via RESPIRATORY_TRACT
  Filled 2024-05-02: qty 7

## 2024-05-02 MED ORDER — ACETAMINOPHEN 10 MG/ML IV SOLN
1000.0000 mg | Freq: Once | INTRAVENOUS | Status: AC
  Administered 2024-05-02: 20:00:00 1000 mg via INTRAVENOUS

## 2024-05-02 MED ORDER — HEMOSTATIC AGENTS (NO CHARGE) OPTIME
TOPICAL | Status: DC | PRN
  Administered 2024-05-02: 17:00:00 1 via TOPICAL

## 2024-05-02 MED ORDER — PHENYLEPHRINE HCL (PRESSORS) 10 MG/ML IV SOLN
INTRAVENOUS | Status: DC | PRN
  Administered 2024-05-02: 18:00:00 10 ug via INTRAVENOUS
  Administered 2024-05-02 (×2): 80 ug via INTRAVENOUS

## 2024-05-02 MED ORDER — ROCURONIUM BROMIDE 10 MG/ML (PF) SYRINGE
PREFILLED_SYRINGE | INTRAVENOUS | Status: AC
  Filled 2024-05-02: qty 10

## 2024-05-02 MED ORDER — OXYCODONE HCL 5 MG PO TABS: 5.0000 mg | ORAL_TABLET | Freq: Once | ORAL | Status: DC | PRN

## 2024-05-02 MED ORDER — HYDROMORPHONE HCL 1 MG/ML IJ SOLN
0.2500 mg | INTRAMUSCULAR | Status: DC | PRN
  Administered 2024-05-02: 20:00:00 0.25 mg via INTRAVENOUS
  Administered 2024-05-02: 20:00:00 0.5 mg via INTRAVENOUS
  Administered 2024-05-02: 19:00:00 0.25 mg via INTRAVENOUS

## 2024-05-02 MED ORDER — DEXTROSE 50 % IV SOLN
12.5000 g | INTRAVENOUS | Status: AC
  Administered 2024-05-02: 09:00:00 12.5 g via INTRAVENOUS
  Filled 2024-05-02: qty 50

## 2024-05-02 MED ORDER — INSULIN ASPART 100 UNIT/ML IJ SOLN
0.0000 [IU] | Freq: Three times a day (TID) | INTRAMUSCULAR | Status: DC
  Administered 2024-05-03 (×3): 3 [IU] via SUBCUTANEOUS
  Administered 2024-05-04 – 2024-05-06 (×3): 2 [IU] via SUBCUTANEOUS
  Filled 2024-05-02: qty 2
  Filled 2024-05-02: qty 3
  Filled 2024-05-02: qty 2
  Filled 2024-05-02: qty 3
  Filled 2024-05-02: qty 2

## 2024-05-02 MED ORDER — PROPOFOL 500 MG/50ML IV EMUL
INTRAVENOUS | Status: AC
  Filled 2024-05-02: qty 50

## 2024-05-02 MED ORDER — SODIUM CHLORIDE 0.45 % IV SOLN: INTRAVENOUS | Status: DC

## 2024-05-02 MED ORDER — HYDROMORPHONE HCL 1 MG/ML IJ SOLN
INTRAMUSCULAR | Status: DC | PRN
  Administered 2024-05-02 (×2): 1 mg via INTRAVENOUS

## 2024-05-02 MED ORDER — STERILE WATER FOR IRRIGATION IR SOLN
Status: DC | PRN
  Administered 2024-05-02: 13:00:00 3000 mL

## 2024-05-02 MED ORDER — ACETAMINOPHEN 10 MG/ML IV SOLN
INTRAVENOUS | Status: AC
  Filled 2024-05-02: qty 100

## 2024-05-02 MED ORDER — PHENYLEPHRINE HCL (PRESSORS) 10 MG/ML IV SOLN
INTRAVENOUS | Status: AC
  Filled 2024-05-02: qty 1

## 2024-05-02 MED ORDER — NON FORMULARY: 1.0000 [IU] | Freq: Three times a day (TID) | Status: DC

## 2024-05-02 MED ORDER — METRONIDAZOLE 500 MG/100ML IV SOLN
500.0000 mg | INTRAVENOUS | Status: AC
  Administered 2024-05-02: 12:00:00 500 mg via INTRAVENOUS
  Filled 2024-05-02: qty 100

## 2024-05-02 MED ORDER — LACTATED RINGERS IV SOLN: INTRAVENOUS | Status: DC

## 2024-05-02 MED ORDER — LIDOCAINE HCL (CARDIAC) PF 100 MG/5ML IV SOSY
PREFILLED_SYRINGE | INTRAVENOUS | Status: DC | PRN
  Administered 2024-05-02: 12:00:00 60 mg via INTRAVENOUS

## 2024-05-02 MED ORDER — DEXTROSE 50 % IV SOLN
INTRAVENOUS | Status: DC | PRN
  Administered 2024-05-02: 11:00:00 .5 via INTRAVENOUS

## 2024-05-02 MED ORDER — INSULIN GLARGINE-YFGN 100 UNIT/ML ~~LOC~~ SOLN
6.0000 [IU] | Freq: Every day | SUBCUTANEOUS | Status: DC
  Administered 2024-05-02 – 2024-05-05 (×3): 6 [IU] via SUBCUTANEOUS
  Filled 2024-05-02 (×5): qty 0.06

## 2024-05-02 MED ORDER — SODIUM CHLORIDE (PF) 0.9 % IJ SOLN
INTRAMUSCULAR | Status: DC | PRN
  Administered 2024-05-02: 18:00:00 100 mL

## 2024-05-02 MED ORDER — ORAL CARE MOUTH RINSE: 15.0000 mL | Freq: Once | OROMUCOSAL | Status: AC

## 2024-05-02 MED ORDER — TRAMADOL HCL 50 MG PO TABS
100.0000 mg | ORAL_TABLET | Freq: Two times a day (BID) | ORAL | Status: DC | PRN
  Administered 2024-05-03 – 2024-05-06 (×3): 100 mg via ORAL
  Filled 2024-05-02 (×3): qty 2

## 2024-05-02 MED ORDER — GLUCERNA SHAKE PO LIQD
237.0000 mL | Freq: Two times a day (BID) | ORAL | Status: DC
  Administered 2024-05-03 – 2024-05-06 (×4): 237 mL via ORAL
  Filled 2024-05-02 (×8): qty 237

## 2024-05-02 MED ORDER — CHEWING GUM (ORBIT) SUGAR FREE
1.0000 | CHEWING_GUM | Freq: Three times a day (TID) | ORAL | Status: DC
  Administered 2024-05-03 – 2024-05-06 (×11): 1 via ORAL
  Filled 2024-05-02: qty 1

## 2024-05-02 MED ORDER — ROCURONIUM BROMIDE 100 MG/10ML IV SOLN
INTRAVENOUS | Status: DC | PRN
  Administered 2024-05-02 (×3): 20 mg via INTRAVENOUS
  Administered 2024-05-02: 12:00:00 100 mg via INTRAVENOUS

## 2024-05-02 MED ORDER — OLOPATADINE HCL 0.1 % OP SOLN
1.0000 [drp] | Freq: Two times a day (BID) | OPHTHALMIC | Status: DC
  Administered 2024-05-02 – 2024-05-06 (×4): 1 [drp] via OPHTHALMIC
  Filled 2024-05-02 (×2): qty 5

## 2024-05-02 MED ORDER — ALBUTEROL SULFATE HFA 108 (90 BASE) MCG/ACT IN AERS: 1.0000 | INHALATION_SPRAY | Freq: Four times a day (QID) | RESPIRATORY_TRACT | Status: DC | PRN

## 2024-05-02 MED ORDER — HYDRALAZINE HCL 20 MG/ML IJ SOLN
5.0000 mg | Freq: Once | INTRAMUSCULAR | Status: AC
  Administered 2024-05-02: 20:00:00 5 mg via INTRAVENOUS

## 2024-05-02 MED ORDER — TIZANIDINE HCL 4 MG PO TABS
4.0000 mg | ORAL_TABLET | Freq: Three times a day (TID) | ORAL | Status: DC
  Administered 2024-05-02 – 2024-05-06 (×11): 4 mg via ORAL
  Filled 2024-05-02 (×11): qty 1

## 2024-05-02 MED ORDER — CEFAZOLIN SODIUM 1 G IJ SOLR
INTRAMUSCULAR | Status: AC
  Filled 2024-05-02: qty 10

## 2024-05-02 MED ORDER — FLUTICASONE FUROATE-VILANTEROL 200-25 MCG/ACT IN AEPB
1.0000 | INHALATION_SPRAY | Freq: Every day | RESPIRATORY_TRACT | Status: DC
  Administered 2024-05-03 – 2024-05-06 (×4): 1 via RESPIRATORY_TRACT
  Filled 2024-05-02: qty 28

## 2024-05-02 MED ORDER — KETAMINE HCL 50 MG/5ML IJ SOSY
PREFILLED_SYRINGE | INTRAMUSCULAR | Status: DC | PRN
  Administered 2024-05-02: 12:00:00 10 mg via INTRAVENOUS
  Administered 2024-05-02 (×2): 20 mg via INTRAVENOUS

## 2024-05-02 MED ORDER — PROPOFOL 1000 MG/100ML IV EMUL
INTRAVENOUS | Status: AC
  Filled 2024-05-02: qty 100

## 2024-05-02 MED ORDER — BUPIVACAINE HCL (PF) 0.25 % IJ SOLN
INTRAMUSCULAR | Status: AC
  Filled 2024-05-02: qty 30

## 2024-05-02 MED ORDER — ONDANSETRON HCL 4 MG/2ML IJ SOLN
INTRAMUSCULAR | Status: AC
  Filled 2024-05-02: qty 2

## 2024-05-02 MED ORDER — HYDROMORPHONE HCL 2 MG/ML IJ SOLN
INTRAMUSCULAR | Status: AC
  Filled 2024-05-02: qty 1

## 2024-05-02 MED ORDER — ACETAMINOPHEN 500 MG PO TABS
1000.0000 mg | ORAL_TABLET | Freq: Two times a day (BID) | ORAL | Status: DC
  Administered 2024-05-03 – 2024-05-06 (×7): 1000 mg via ORAL
  Filled 2024-05-02 (×7): qty 2

## 2024-05-02 MED ORDER — SENNOSIDES-DOCUSATE SODIUM 8.6-50 MG PO TABS
2.0000 | ORAL_TABLET | Freq: Every day | ORAL | Status: DC
  Administered 2024-05-02 – 2024-05-05 (×4): 2 via ORAL
  Filled 2024-05-02 (×4): qty 2

## 2024-05-02 MED ORDER — ACETAMINOPHEN 500 MG PO TABS
1000.0000 mg | ORAL_TABLET | ORAL | Status: AC
  Administered 2024-05-02: 09:00:00 1000 mg via ORAL
  Filled 2024-05-02: qty 2

## 2024-05-02 MED ORDER — OXYCODONE HCL 5 MG PO TABS
5.0000 mg | ORAL_TABLET | ORAL | Status: DC | PRN
  Administered 2024-05-03 – 2024-05-06 (×6): 5 mg via ORAL
  Filled 2024-05-02 (×7): qty 1

## 2024-05-02 MED ORDER — ONDANSETRON HCL 4 MG/2ML IJ SOLN: 4.0000 mg | Freq: Once | INTRAMUSCULAR | Status: DC | PRN

## 2024-05-02 MED ORDER — AMISULPRIDE (ANTIEMETIC) 5 MG/2ML IV SOLN: 10.0000 mg | Freq: Once | INTRAVENOUS | Status: DC | PRN

## 2024-05-02 MED ORDER — SUGAMMADEX SODIUM 200 MG/2ML IV SOLN
INTRAVENOUS | Status: DC | PRN
  Administered 2024-05-02: 18:00:00 300 mg via INTRAVENOUS

## 2024-05-02 MED ORDER — FENTANYL CITRATE (PF) 100 MCG/2ML IJ SOLN
INTRAMUSCULAR | Status: AC
  Filled 2024-05-02: qty 2

## 2024-05-02 MED ORDER — IPRATROPIUM-ALBUTEROL 0.5-2.5 (3) MG/3ML IN SOLN
RESPIRATORY_TRACT | Status: AC
  Filled 2024-05-02: qty 3

## 2024-05-02 MED ORDER — INSULIN ASPART 100 UNIT/ML IJ SOLN: 0.0000 [IU] | INTRAMUSCULAR | Status: DC | PRN

## 2024-05-02 MED ORDER — HEPARIN SODIUM (PORCINE) 5000 UNIT/ML IJ SOLN
5000.0000 [IU] | INTRAMUSCULAR | Status: AC
  Administered 2024-05-02: 09:00:00 5000 [IU] via SUBCUTANEOUS
  Filled 2024-05-02: qty 1

## 2024-05-02 MED ORDER — ONDANSETRON HCL 4 MG/2ML IJ SOLN
INTRAMUSCULAR | Status: DC | PRN
  Administered 2024-05-02: 12:00:00 4 mg via INTRAVENOUS

## 2024-05-02 MED ORDER — CABERGOLINE 0.5 MG PO TABS
0.2500 mg | ORAL_TABLET | ORAL | Status: DC
  Administered 2024-05-04: 11:00:00 0.25 mg via ORAL

## 2024-05-02 MED ORDER — ALBUTEROL SULFATE HFA 108 (90 BASE) MCG/ACT IN AERS
INHALATION_SPRAY | RESPIRATORY_TRACT | Status: DC | PRN
  Administered 2024-05-02: 12:00:00 6 via RESPIRATORY_TRACT

## 2024-05-02 MED ORDER — HYDROMORPHONE HCL 1 MG/ML IJ SOLN
0.5000 mg | INTRAMUSCULAR | Status: DC | PRN
  Administered 2024-05-02 – 2024-05-04 (×4): 0.5 mg via INTRAVENOUS
  Filled 2024-05-02 (×4): qty 0.5

## 2024-05-02 MED ORDER — CEFAZOLIN SODIUM-DEXTROSE 2-4 GM/100ML-% IV SOLN
2.0000 g | INTRAVENOUS | Status: AC
  Administered 2024-05-02 (×2): 2 g via INTRAVENOUS
  Filled 2024-05-02: qty 100

## 2024-05-02 MED ORDER — ROSUVASTATIN CALCIUM 20 MG PO TABS
40.0000 mg | ORAL_TABLET | Freq: Every day | ORAL | Status: DC
  Administered 2024-05-03 – 2024-05-06 (×4): 40 mg via ORAL
  Filled 2024-05-02 (×4): qty 2

## 2024-05-02 MED ORDER — BUPIVACAINE HCL 0.25 % IJ SOLN
INTRAMUSCULAR | Status: DC | PRN
  Administered 2024-05-02: 13:00:00 26 mL

## 2024-05-02 MED ORDER — LABETALOL HCL 100 MG PO TABS: 200.0000 mg | ORAL_TABLET | Freq: Two times a day (BID) | ORAL | Status: DC | PRN

## 2024-05-02 MED ORDER — PHENYLEPHRINE HCL-NACL 20-0.9 MG/250ML-% IV SOLN
INTRAVENOUS | Status: DC | PRN
  Administered 2024-05-02: 12:00:00 50 ug/min via INTRAVENOUS

## 2024-05-02 MED ORDER — MIDAZOLAM HCL 2 MG/2ML IJ SOLN
INTRAMUSCULAR | Status: AC
  Filled 2024-05-02: qty 2

## 2024-05-02 MED ORDER — ALBUTEROL SULFATE (2.5 MG/3ML) 0.083% IN NEBU: 2.5000 mg | INHALATION_SOLUTION | Freq: Four times a day (QID) | RESPIRATORY_TRACT | Status: DC | PRN

## 2024-05-02 MED ORDER — CHLORHEXIDINE GLUCONATE 0.12 % MT SOLN
15.0000 mL | Freq: Once | OROMUCOSAL | Status: AC
  Administered 2024-05-02: 09:00:00 15 mL via OROMUCOSAL

## 2024-05-02 MED ORDER — MIDAZOLAM HCL 5 MG/5ML IJ SOLN
INTRAMUSCULAR | Status: DC | PRN
  Administered 2024-05-02: 12:00:00 2 mg via INTRAVENOUS

## 2024-05-02 MED ORDER — ACETAMINOPHEN 500 MG PO TABS: 1000.0000 mg | ORAL_TABLET | Freq: Once | ORAL | Status: DC

## 2024-05-02 MED ORDER — HYDRALAZINE HCL 20 MG/ML IJ SOLN
INTRAMUSCULAR | Status: AC
  Filled 2024-05-02: qty 1

## 2024-05-02 MED ORDER — TIOTROPIUM BROMIDE 2.5 MCG/ACT IN AERS: 2.0000 | INHALATION_SPRAY | Freq: Every day | RESPIRATORY_TRACT | Status: DC

## 2024-05-02 MED ORDER — HEPARIN SODIUM (PORCINE) 5000 UNIT/ML IJ SOLN
5000.0000 [IU] | Freq: Three times a day (TID) | INTRAMUSCULAR | Status: DC
  Administered 2024-05-03 – 2024-05-06 (×10): 5000 [IU] via SUBCUTANEOUS
  Filled 2024-05-02 (×10): qty 1

## 2024-05-02 MED ORDER — DEXAMETHASONE SOD PHOSPHATE PF 10 MG/ML IJ SOLN
4.0000 mg | INTRAMUSCULAR | Status: AC
  Administered 2024-05-02: 12:00:00 10 mg via INTRAVENOUS

## 2024-05-02 MED ORDER — NORTRIPTYLINE HCL 25 MG PO CAPS
25.0000 mg | ORAL_CAPSULE | Freq: Every day | ORAL | Status: DC
  Administered 2024-05-02 – 2024-05-05 (×4): 25 mg via ORAL
  Filled 2024-05-02 (×5): qty 1

## 2024-05-02 MED ORDER — ALBUTEROL SULFATE HFA 108 (90 BASE) MCG/ACT IN AERS
INHALATION_SPRAY | RESPIRATORY_TRACT | Status: AC
  Filled 2024-05-02: qty 6.7

## 2024-05-02 SURGICAL SUPPLY — 77 items
APPLICATOR SURGIFLO ENDO (HEMOSTASIS) IMPLANT
BAG LAPAROSCOPIC 12 15 PORT 16 (BASKET) IMPLANT
BLADE EXTENDED COATED 6.5IN (ELECTRODE) IMPLANT
BLADE SURG SZ10 CARB STEEL (BLADE) IMPLANT
CHLORAPREP W/TINT 26 (MISCELLANEOUS) ×4 IMPLANT
CLIP TI MEDIUM 6 (CLIP) IMPLANT
CLIP TI MEDIUM LARGE 6 (CLIP) IMPLANT
COVER BACK TABLE 60X90IN (DRAPES) ×4 IMPLANT
COVER TIP SHEARS 8 DVNC (MISCELLANEOUS) ×4 IMPLANT
DERMABOND ADVANCED .7 DNX12 (GAUZE/BANDAGES/DRESSINGS) ×4 IMPLANT
DRAPE ARM DVNC X/XI (DISPOSABLE) ×16 IMPLANT
DRAPE COLUMN DVNC XI (DISPOSABLE) ×4 IMPLANT
DRAPE SHEET LG 3/4 BI-LAMINATE (DRAPES) ×4 IMPLANT
DRAPE SURG IRRIG POUCH 19X23 (DRAPES) ×4 IMPLANT
DRIVER NDL MEGA SUTCUT DVNCXI (INSTRUMENTS) ×3 IMPLANT
DRIVER NDLE MEGA SUTCUT DVNCXI (INSTRUMENTS) ×4 IMPLANT
DRSG OPSITE POSTOP 4X10 (GAUZE/BANDAGES/DRESSINGS) IMPLANT
DRSG OPSITE POSTOP 4X6 (GAUZE/BANDAGES/DRESSINGS) IMPLANT
DRSG OPSITE POSTOP 4X8 (GAUZE/BANDAGES/DRESSINGS) IMPLANT
ELECT BLADE TIP CTD 4 INCH (ELECTRODE) IMPLANT
ELECT PENCIL ROCKER SW 15FT (MISCELLANEOUS) IMPLANT
ELECT REM PT RETURN 15FT ADLT (MISCELLANEOUS) ×4 IMPLANT
ELECTRODE REM PT RTRN 9FT ADLT (ELECTROSURGICAL) IMPLANT
FORCEPS BPLR FENES DVNC XI (FORCEP) ×4 IMPLANT
FORCEPS PROGRASP DVNC XI (FORCEP) ×4 IMPLANT
GAUZE 4X4 16PLY ~~LOC~~+RFID DBL (SPONGE) ×4 IMPLANT
GLOVE BIO SURGEON STRL SZ 6.5 (GLOVE) ×4 IMPLANT
GLOVE BIOGEL PI IND STRL 6.5 (GLOVE) ×8 IMPLANT
GLOVE BIOGEL PI MICRO STRL 6 (GLOVE) ×16 IMPLANT
GOWN STRL REUS W/ TWL LRG LVL3 (GOWN DISPOSABLE) ×16 IMPLANT
GRASPER SUT TROCAR 14GX15 (MISCELLANEOUS) IMPLANT
HOLDER FOLEY CATH W/STRAP (MISCELLANEOUS) IMPLANT
IRRIGATION SUCT STRKRFLW 2 WTP (MISCELLANEOUS) ×4 IMPLANT
KIT PROCEDURE DVNC SI (MISCELLANEOUS) IMPLANT
KIT TURNOVER KIT A (KITS) ×4 IMPLANT
LIGASURE IMPACT 36 18CM CVD LR (INSTRUMENTS) IMPLANT
LOOP VESSEL MAXI BLUE (MISCELLANEOUS) IMPLANT
MANIPULATOR ADVINCU DEL 3.0 PL (MISCELLANEOUS) IMPLANT
MANIPULATOR ADVINCU DEL 3.5 PL (MISCELLANEOUS) IMPLANT
MANIPULATOR UTERINE 4.5 ZUMI (MISCELLANEOUS) IMPLANT
NDL HYPO 21X1.5 SAFETY (NEEDLE) ×3 IMPLANT
NDL INSUFFLATION 14GA 120MM (NEEDLE) IMPLANT
NDL SPNL 20GX3.5 QUINCKE YW (NEEDLE) IMPLANT
NEEDLE HYPO 21X1.5 SAFETY (NEEDLE) ×4 IMPLANT
NEEDLE INSUFFLATION 14GA 120MM (NEEDLE) IMPLANT
NEEDLE SPNL 20GX3.5 QUINCKE YW (NEEDLE) IMPLANT
OBTURATOR OPTICALSTD 8 DVNC (TROCAR) ×4 IMPLANT
PACK ROBOT GYN CUSTOM WL (TRAY / TRAY PROCEDURE) ×4 IMPLANT
PAD ARMBOARD POSITIONER FOAM (MISCELLANEOUS) ×4 IMPLANT
PAD POSITIONING PINK XL (MISCELLANEOUS) ×4 IMPLANT
PORT ACCESS TROCAR AIRSEAL 12 (TROCAR) IMPLANT
RETRACTOR WND ALEXIS 25 LRG (MISCELLANEOUS) IMPLANT
SCISSORS LAP 5X35 DISP (ENDOMECHANICALS) IMPLANT
SCISSORS LAP 5X45 EPIX DISP (ENDOMECHANICALS) IMPLANT
SCISSORS MNPLR CVD DVNC XI (INSTRUMENTS) ×4 IMPLANT
SCRUB CHG 4% DYNA-HEX 4OZ (MISCELLANEOUS) ×8 IMPLANT
SEAL UNIV 5-12 XI (MISCELLANEOUS) ×16 IMPLANT
SET TRI-LUMEN FLTR TB AIRSEAL (TUBING) ×4 IMPLANT
SPIKE FLUID TRANSFER (MISCELLANEOUS) ×4 IMPLANT
SPONGE T-LAP 18X18 ~~LOC~~+RFID (SPONGE) IMPLANT
SURGIFLO W/THROMBIN 8M KIT (HEMOSTASIS) IMPLANT
SUT MNCRL AB 4-0 PS2 18 (SUTURE) IMPLANT
SUT PDS AB 1 TP1 54 (SUTURE) IMPLANT
SUT VIC AB 0 CT1 27XBRD ANTBC (SUTURE) IMPLANT
SUT VIC AB 2-0 CT1 TAPERPNT 27 (SUTURE) IMPLANT
SUT VIC AB 4-0 PS2 18 (SUTURE) ×8 IMPLANT
SUT VICRYL 0 27 CT2 27 ABS (SUTURE) ×4 IMPLANT
SYR 10ML LL (SYRINGE) IMPLANT
SYSTEM BAG RETRIEVAL 10MM (BASKET) IMPLANT
SYSTEM WOUND ALEXIS 18CM MED (MISCELLANEOUS) IMPLANT
TRAP SPECIMEN MUCUS 40CC (MISCELLANEOUS) IMPLANT
TRAY FOLEY MTR SLVR 16FR STAT (SET/KITS/TRAYS/PACK) ×4 IMPLANT
TROCAR PORT AIRSEAL 5X120 (TROCAR) IMPLANT
TROCAR XCEL NON-BLD 5MMX100MML (ENDOMECHANICALS) IMPLANT
UNDERPAD 30X36 HEAVY ABSORB (UNDERPADS AND DIAPERS) ×8 IMPLANT
WATER STERILE IRR 1000ML POUR (IV SOLUTION) ×4 IMPLANT
YANKAUER SUCT BULB TIP 10FT TU (MISCELLANEOUS) IMPLANT

## 2024-05-02 NOTE — Anesthesia Procedure Notes (Signed)
 Procedure Name: Intubation Date/Time: 05/02/2024 11:49 AM  Performed by: Lanning Cena RAMAN, CRNAPre-anesthesia Checklist: Patient identified, Emergency Drugs available, Suction available and Patient being monitored Patient Re-evaluated:Patient Re-evaluated prior to induction Oxygen Delivery Method: Circle system utilized Preoxygenation: Pre-oxygenation with 100% oxygen Induction Type: IV induction Ventilation: Mask ventilation without difficulty Laryngoscope Size: Miller and 2 Grade View: Grade II Tube type: Oral Tube size: 7.0 mm Airway Equipment and Method: Stylet Placement Confirmation: ETT inserted through vocal cords under direct vision, positive ETCO2, breath sounds checked- equal and bilateral and CO2 detector Secured at: 20 cm Tube secured with: Tape Dental Injury: Teeth and Oropharynx as per pre-operative assessment

## 2024-05-02 NOTE — Anesthesia Postprocedure Evaluation (Signed)
 Anesthesia Post Note  Patient: Christina Solis  Procedure(s) Performed: ROBOTIC ASSISTED TOTAL LAPAROSCOPIC HYSTERECTOMY BILATERAL SALPINGO-OOPHORECTOMY CONVERTED TO LAPAROTOMY FOR PARA-AORTIC LYMPH NODE DISSECTION (Bilateral) LEFT PELVIC AND PARA-AORTIC LYMPHADENECTOMY (Left) OMENTECTOMY     Patient location during evaluation: PACU Anesthesia Type: General Level of consciousness: awake and alert, oriented and patient cooperative Pain management: pain level controlled Vital Signs Assessment: post-procedure vital signs reviewed and stable Respiratory status: spontaneous breathing, nonlabored ventilation and respiratory function stable Cardiovascular status: blood pressure returned to baseline and stable Postop Assessment: no apparent nausea or vomiting Anesthetic complications: no   No notable events documented.  Last Vitals:  Vitals:   05/02/24 1915 05/02/24 1945  BP: (!) 141/91 (!) 160/79  Pulse: 95 92  Resp: 15 10  Temp:    SpO2: 100% 99%    Last Pain:  Vitals:   05/02/24 1945  TempSrc:   PainSc: 0-No pain                 [name]

## 2024-05-02 NOTE — Interval H&P Note (Signed)
 History and Physical Interval Note:  05/02/2024 11:17 AM  Christina Solis  has presented today for surgery, with the diagnosis of ovarian mass, thickened endometrium.  The various methods of treatment have been discussed with the patient and family. After consideration of risks, benefits and other options for treatment, the patient has consented to  Procedures with comments: HYSTERECTOMY, TOTAL, ROBOT-ASSISTED, LAPAROSCOPIC, WITH BILATERAL SALPINGO-OOPHORECTOMY (N/A) - ROBOTIC ASSISTED, POSSIBLE STAGING INJECTION, FOR SENTINEL LYMPH NODE IDENTIFICATION (N/A) LYMPH NODE BIOPSY (N/A) LYMPHADENECTOMY, PELVIS, ROBOT-ASSISTED (N/A) as a surgical intervention.  The patient's history has been reviewed, patient examined, no change in status, stable for surgery.  I have reviewed the patient's chart and labs.  Questions were answered to the patient's satisfaction.     Samanthamarie Ezzell

## 2024-05-02 NOTE — Discharge Instructions (Signed)
 AFTER SURGERY INSTRUCTIONS   Return to work: 4-6 weeks if applicable   Activity: 1. Be up and out of the bed during the day.  Take a nap if needed.  You may walk up steps but be careful and use the hand rail.  Stair climbing will tire you more than you think, you may need to stop part way and rest.    2. No lifting or straining for 6 weeks over 10 pounds. No pushing, pulling, straining for 6 weeks.   3. No driving for 4-89 days when the following criteria have been met: Do not drive if you are taking narcotic pain medicine and make sure that your reaction time has returned.    4. You can shower as soon as the next day after surgery. Shower daily.  Use your regular soap and water  (not directly on the incision) and pat your incision(s) dry afterwards; don't rub.  No tub baths or submerging your body in water  until cleared by your surgeon. If you have the soap that was given to you by pre-surgical testing that was used before surgery, you do not need to use it afterwards because this can irritate your incisions.    5. No sexual activity and nothing in the vagina for 12 weeks.   6. You may experience a small amount of clear drainage from your incisions, which is normal.  If the drainage persists, increases, or changes color please call the office.   7. Do not use creams, lotions, or ointments such as neosporin on your incisions after surgery until advised by your surgeon because they can cause removal of the dermabond glue on your incisions.     8. You may experience vaginal spotting after surgery or when the stitches at the top of the vagina begin to dissolve.  The spotting is normal but if you experience heavy bleeding, call our office.   Diet: 1. Low sodium Heart Healthy Diet is recommended but you are cleared to resume your normal (before surgery) diet after your procedure.   2.  surgery to keep bowel movements regular and to prevent constipation.     Wound Care: 1. Keep clean and dry.   Shower daily.   Reasons to call the Doctor: Fever - Oral temperature greater than 100.4 degrees Fahrenheit Foul-smelling vaginal discharge Difficulty urinating Nausea and vomiting Increased pain at the site of the incision that is unrelieved with pain medicine. Difficulty breathing with or without chest pain New calf pain especially if only on one side Sudden, continuing increased vaginal bleeding with or without clots.   Contacts: For questions or concerns you should contact:   Dr. Hoy Masters at [phone]   Eleanor Epps, NP at [phone]   After Hours: call [phone] and have the GYN Oncologist paged/contacted (after 5 pm or on the weekends). You will speak with an after hours RN and let he or she know you have had surgery.   Messages sent via mychart are for non-urgent matters and are not responded to after hours so for urgent needs, please call the after hours number.

## 2024-05-02 NOTE — Brief Op Note (Signed)
 05/02/2024  6:19 PM  PATIENT:  Christina Solis  72 y.o. female  PRE-OPERATIVE DIAGNOSIS:  ovarian mass, thickened endometrium  POST-OPERATIVE DIAGNOSIS:  ovarian mass, thickened endometrium  PROCEDURE:  Procedures with comments: ROBOTIC ASSISTED TOTAL LAPAROSCOPIC HYSTERECTOMY BILATERAL SALPINGO-OOPHORECTOMY CONVERTED TO LAPAROTOMY FOR PARA-AORTIC LYMPH NODE DISSECTION (Bilateral) - ROBOTIC ASSISTED, POSSIBLE STAGING LEFT PELVIC AND PARA-AORTIC LYMPHADENECTOMY (Left) OMENTECTOMY  SURGEON:  Surgeons and Role:    DEWAINE [name], MD - Primary    * Rogelio Planas, MD - Assisting   ANESTHESIA:   general  EBL:   BLOOD ADMINISTERED:none  DRAINS: none   LOCAL MEDICATIONS USED:  OTHER Exparel , Marcaine   SPECIMEN:   ID Type Source Tests Collected by Time Destination  1 : UTERUS, CERVIX, BILATERAL TUBES AND OVARIES. FROZEN -LEFT OVARIAN MASS. EVALUATE ENDOMETRIUM Tissue PATH Gyn benign resection SURGICAL PATHOLOGY [name], MD 05/02/2024 1340   2 : External Iliac Lymph Node Tissue PATH Lymph node excision SURGICAL PATHOLOGY [name], MD 05/02/2024 1511   3 : Left Pelvic Lymph Nodes Tissue PATH Lymph node excision SURGICAL PATHOLOGY [name], MD 05/02/2024 1517   4 : anterior cul de sac peritoneal bx Tissue PATH Lymph node excision SURGICAL PATHOLOGY [name], MD 05/02/2024 1522   5 : Posterior Cul Everitt Brilliant Peritoneal Biopsy Tissue PATH Gyn biopsy SURGICAL PATHOLOGY [name], MD 05/02/2024 1525   6 : Right Pelvic Peritoneal Biopsy Tissue PATH Gyn biopsy SURGICAL PATHOLOGY [name], MD 05/02/2024 1526   7 : Left Pelvic Peritoneal Biopsy Tissue PATH Gyn biopsy SURGICAL PATHOLOGY [name], MD 05/02/2024 1527   8 : Left Paracolic Gutter Peritoneal Biopsy Tissue PATH Gyn biopsy SURGICAL PATHOLOGY [name], MD 05/02/2024 1528   9 : Right Paracolic Gutter Peritoneal Biopsy Tissue PATH Gyn biopsy SURGICAL PATHOLOGY  [name], MD 05/02/2024 1531   10 : OMENTUM Tissue PATH GI tumor resection SURGICAL PATHOLOGY [name], MD 05/02/2024 1600   11 : Left Paraortic Lymph Node Tissue PATH Lymph node excision SURGICAL PATHOLOGY [name], MD 05/02/2024 1710   12 : Hernia Sac Tissue PATH Gyn biopsy SURGICAL PATHOLOGY [name], MD 05/02/2024 1728   A : PELVIC WASHINGS Body Fluid Peritoneal Washings CYTOLOGY - NON PAP [name], MD 05/02/2024 1305      DISPOSITION OF SPECIMEN:  PATHOLOGY  COUNTS:  YES  TOURNIQUET:  * No tourniquets in log *  DICTATION: .Note written in EPIC  PLAN OF CARE: Admit to inpatient   PATIENT DISPOSITION:  PACU - hemodynamically stable.   Delay start of Pharmacological VTE agent (>24hrs) due to surgical blood loss or risk of bleeding: no

## 2024-05-02 NOTE — Transfer of Care (Signed)
 Immediate Anesthesia Transfer of Care Note  Patient: Christina Solis  Procedure(s) Performed: ROBOTIC ASSISTED TOTAL LAPAROSCOPIC HYSTERECTOMY BILATERAL SALPINGO-OOPHORECTOMY CONVERTED TO LAPAROTOMY FOR PARA-AORTIC LYMPH NODE DISSECTION (Bilateral) LEFT PELVIC AND PARA-AORTIC LYMPHADENECTOMY (Left) OMENTECTOMY  Patient Location: PACU  Anesthesia Type:General  Level of Consciousness: sedated, unresponsive, and drowsy  Airway & Oxygen Therapy: Patient Spontanous Breathing and Patient connected to face mask oxygen  Post-op Assessment: Report given to RN and Post -op Vital signs reviewed and stable  Post vital signs: Reviewed and stable  Last Vitals:  Vitals Value Taken Time  BP 147/74 05/02/24 18:45  Temp    Pulse 89 05/02/24 18:46  Resp 11 05/02/24 18:46  SpO2 99 % 05/02/24 18:46  Vitals shown include unfiled device data.  Last Pain:  Vitals:   05/02/24 0926  TempSrc:   PainSc: 0-No pain         Complications: No notable events documented.

## 2024-05-02 NOTE — Progress Notes (Signed)
 Spoke with family member, patient was currently in surgery.

## 2024-05-02 NOTE — Op Note (Signed)
 GYNECOLOGIC ONCOLOGY OPERATIVE NOTE  Date of Service: 05/02/2024  Preoperative Diagnosis: Complex ovarian mass, thickened endometrium, postmenopausal bleeding, obesity (BMI 37)  Postoperative Diagnosis: Left ovarian mass consistent with adenocarcinoma, endometrial polyps  Procedures: Robotic-assisted total laparoscopic hysterectomy, bilateral salpingo-oophorectomy, left pelvic lymphadenectomy, conversion to laparotomy for left periotic lymphadenectomy and omentectomy (Modifer 22: increased duration of the procedure by > 60 min due to complexity due adhesive disease requiring extensive meticulous lysis of adhesions, necessitating additional instrumentation for retraction and safe exposure as well as increased duration due to obesity, BMI 37, with significant retroperitoneal and intraperitoneal adiposity requiring additional retraction for safe exposure and visualization.)   Surgeon: Christina Masters, MD  Assistants: Christina Mill, MD and (an MD assistant was necessary for tissue manipulation, management of robotic instrumentation, retraction and positioning due to the complexity of the case and hospital policies)  Anesthesia: General  Estimated Blood Loss:  150 ml  Fluids: 1000 ml, crystalloid  Urine Output: 1100 ml, clear yellow  Findings: On entry to abdomen, normal upper abdominal survey with smooth diaphragm, liver, stomach and normal appearing omentum and bowel.  Evidence of prior cholecystectomy.  Dense adhesions of the omentum to the anterior abdominal wall and involving a periumbilical hernia.  In the pelvis, globally normal-appearing uterus.  Bilateral fallopian tubes with evidence of prior tubal ligation.  Normal-appearing right ovary.  Left ovary with a cystic and solid mass.  Intraoperative frozen section of left ovarian mass consistent with adenocarcinoma unable to further characterize.  Uterus with endometrial polyps but without intrauterine malignancy concern.  At time of  left pelvic lymphadenectomy, distal extrailiac lymph nodes appeared grossly enlarged.  Representative lymph nodes sent for frozen pathology and returned benign.  Limited safe visualization of periotic region robotically and thus converted to laparotomy to complete para-aortic lymphadenectomy.  No palpably enlarged para-aortic or right pelvic lymph nodes.  Omentum grossly normal-appearing.  Adhesions in the right lower quadrant at site of prior appendectomy.   Specimens:  ID Type Source Tests Collected by Time Destination  1 : UTERUS, CERVIX, BILATERAL TUBES AND OVARIES. FROZEN -LEFT OVARIAN MASS. EVALUATE ENDOMETRIUM Tissue PATH Gyn benign resection SURGICAL PATHOLOGY Solis Hoy, MD 05/02/2024 1340   2 : External Iliac Lymph Node Tissue PATH Lymph node excision SURGICAL PATHOLOGY Solis Hoy, MD 05/02/2024 1511   3 : Left Pelvic Lymph Nodes Tissue PATH Lymph node excision SURGICAL PATHOLOGY Solis Hoy, MD 05/02/2024 1517   4 : anterior cul de sac peritoneal bx Tissue PATH Lymph node excision SURGICAL PATHOLOGY Solis Hoy, MD 05/02/2024 1522   5 : Posterior Cul Christina Solis Peritoneal Biopsy Tissue PATH Gyn biopsy SURGICAL PATHOLOGY Solis Hoy, MD 05/02/2024 1525   6 : Right Pelvic Peritoneal Biopsy Tissue PATH Gyn biopsy SURGICAL PATHOLOGY Solis Hoy, MD 05/02/2024 1526   7 : Left Pelvic Peritoneal Biopsy Tissue PATH Gyn biopsy SURGICAL PATHOLOGY Solis Hoy, MD 05/02/2024 1527   8 : Left Paracolic Gutter Peritoneal Biopsy Tissue PATH Gyn biopsy SURGICAL PATHOLOGY Solis Hoy, MD 05/02/2024 1528   9 : Right Paracolic Gutter Peritoneal Biopsy Tissue PATH Gyn biopsy SURGICAL PATHOLOGY Solis Hoy, MD 05/02/2024 1531   10 : OMENTUM Tissue PATH GI tumor resection SURGICAL PATHOLOGY Solis Hoy, MD 05/02/2024 1600   11 : Left Paraortic Lymph Node Tissue PATH Lymph node excision SURGICAL PATHOLOGY Solis Hoy, MD 05/02/2024 1710   12 : Hernia Sac Tissue  PATH Gyn biopsy SURGICAL PATHOLOGY Solis Hoy, MD 05/02/2024 1728   A : PELVIC WASHINGS Body Fluid Peritoneal Washings CYTOLOGY -  NON PAP [name], MD 05/02/2024 1305     Complications:  None  Indications for Procedure: Christina Solis is a 73 y.o. woman with a complex pelvic mass, thickened endometrium, and postmenopausal bleeding with mild elevation in inhibin A and estradiol but otherwise normal tumor markers.  Prior to the procedure, all risks, benefits, and alternatives were discussed and informed surgical consent was signed.  Procedure: Patient was taken to the operating room where general anesthesia was achieved.  She was positioned in dorsal lithotomy and prepped and draped.  A foley catheter was inserted into the bladder.  The cervix was dilated and an Advincula uterine manipulator with a colpotomy ring was inserted into the uterus.  A 12 mm incision was made in the left upper quadrant near Palmer's point.  The abdomen was entered with a 5 mm OptiView trocar under direct visualization.  The abdomen was insufflated, the patient placed in steep Trendelenburg, and additional trocars were placed as follows:  two 8 mm robotic trocars in the right abdomen, and one 8 mm robotic trocar in the left abdomen.  Adhesions of the omentum to the anterior abdominal wall were lysed with a combination of sharp dissection, electrocautery, and blunt dissection.  The omentum was noted to be adherent within a periumbilical hernia.  With the hernia reduced and the omentum detached from the anterior abdominal wall, an additional 8 mm robotic trocar was placed in the midline superior to the umbilicus. The left upper quadrant trocar was removed and replaced with a 12 mm airseal trocar.  All trocars were placed under direct visualization.  The bowels were moved into the upper abdomen.  The DaVinci robotic surgical system was brought to the patient's bedside and docked.  Pelvic washings were obtained.   Adhesions of the small bowel to the right lower quadrant and the IP ligament were lysed sharply with scissors at the site of prior appendectomy.  The right round ligament was transected and the retroperitoneum entered.  The right ureter was identified. The right infundibulopelvic ligament was isolated, cauterized, and transected. The posterior peritoneum was opened to the KOH ring.  The anterior peritoneum was opened and the bladder flap was created.  The right uterine artery was skeletonized, cauterized, and transected at the level of the KOH ring. Additional cautery was used in a C-shaped fashion to allow the remainder of the broad, cardinal, and uterosacral ligaments with the uterine vessels to be transected and fall away from the KOH ring. A similar procedure was performed on the left side.  A colpotomy was made circumferentially following the contours of the KOH ring.  The left utero-ovarian ligament and proximal fallopian tube were cauterized and transected to detach the left ovarian mass from the uterine specimen.  The uterine specimen was removed through the vagina.  An Endo Catch bag was introduced through the vagina and the left adnexal mass was placed in the Endo Catch bag.  This was brought out through the vagina.  The cystic mass was drained within the bag and removed for frozen pathology.  The vaginal cuff was closed with a running stitch of 0 Vicryl suture.  The pelvis was irrigated and all operative sites were found to be hemostatic.   Frozen pathology returned with adenocarcinoma not further able to characterize.  Given this decision made to proceed with staging.The left paravesical and pararectal spaces were opened.  A left pelvic lymphadenectomy was performed using the following boundaries: the bifurcation of the common iliac vessels cephalad, the distal  circumflex vein caudad, the genitofemoral nerve laterally, the superior vesical artery medially, and the obturator nerve at the floor of the  dissection.  The lymph node specimens were placed in Endo-Catch bags and removed through the assistant trocar site.  The lymph nodes were inspected grossly and the enlarged lymph node that had been noted at the distal external iliac artery was selected to be sent for frozen pathology to evaluate for gross disease given limited ability to visualize safely the periotic region robotically given patient adiposity.  This returned benign.  Given this, decision made to proceed with laparotomy to perform periotic lymph node dissection.  Prior to conversion to laparotomy, peritoneal biopsies were obtained in all quadrants.  The pelvis was irrigated and all operative sites were found to be hemostatic.  Surgiflo was placed in the left pelvic lymph node dissection bed and at the vaginal cuff.  All instruments were removed and the robot was taken from the patient's bedside. The fascia at the 12 mm incision was closed with 0 Vicryl using a PMI device.   With insufflation in place, the midline robotic trocar was removed and the skin extension was extended with a scalpel superiorly and inferiorly around the umbilicus.  The subcutaneous tissue was divided with electrocautery.  The fascial and peritoneal incisions were extended.  The abdomen was desufflated and all robotic trocars were removed.  An Alexis retractor was placed.  The omentum was elevated out of the incision.  An area superior to the transverse colon was incised and the lesser sac was entered.  Careful dissection was done to remove the omentum from the length of the transverse colon with electrocautery. Pedicles were made in the omentum superior to the transverse colon but caudad to the short gastrics. These were cauterized and transected with Ligasure cauter. Hemostasis was noted at all pedicles.   The Bookwalter retractor was then set up and the bowel was packed into the upper abdomen.  The peritoneum overlying the right common iliac vessels was opened and  extended cephalad following the aorta.  The left ureter was identified and retracted up toward the anterior abdominal wall along with the left descending and rectosigmoid colon.  A left para-aortic lymphadenectomy was performed from the common iliac vessels caudad to the insertion of the inferior mesenteric artery.  The lymph node specimen was handed off the field for final pathology.  The abdomen and pelvis was irrigated and all operative sites were found to be hemostatic.  Surgiflo was placed in the perirectal lymph node dissection bed.  The hernia sac was excised with electrocautery.  The fascia at the laparotomy incision was closed with 1 running stitches of #1 PDS. Exparel  was injected at the incision site in standard fashion. The subcutaneous tissues were irrigated and hemostasis achieved. The subcutaneous space was approximated with 2-0 Vicryl in a running fashion.  The skin was reapproximated with a deep dermal stitch of 4-0 Vicryl.  The skin was closed with 4-0 Monocryl in a subcuticular fashion followed by surgical glue.    The skin at all laparoscopic incisions was closed with 4-0 Vicryl to reapproximate the subcutaneous tissue and 4-0 monocryl in a subcuticular fashion followed by surgical glue.  Patient tolerated the procedure well. Sponge, lap, and instrument counts were correct.  Patient received 2 gm of Ancef  and 500 mg of metronidazole  prior to skin incision for routine perioperative antibiotic prophylaxis.  She was extubated and taken to the PACU in stable condition.  Christina Masters, MD Gynecologic Oncology

## 2024-05-03 ENCOUNTER — Other Ambulatory Visit (HOSPITAL_COMMUNITY)

## 2024-05-03 ENCOUNTER — Encounter (HOSPITAL_COMMUNITY): Payer: Self-pay | Admitting: Psychiatry

## 2024-05-03 LAB — BASIC METABOLIC PANEL WITH GFR
Anion gap: 10 (ref 5–15)
BUN: 21 mg/dL (ref 8–23)
CO2: 25 mmol/L (ref 22–32)
Calcium: 8.5 mg/dL — ABNORMAL LOW (ref 8.9–10.3)
Chloride: 103 mmol/L (ref 98–111)
Creatinine, Ser: 1.15 mg/dL — ABNORMAL HIGH (ref 0.44–1.00)
GFR, Estimated: 51 mL/min — ABNORMAL LOW (ref >60)
Glucose, Bld: 171 mg/dL — ABNORMAL HIGH (ref 70–99)
Potassium: 4.6 mmol/L (ref 3.5–5.1)
Sodium: 138 mmol/L (ref 135–145)

## 2024-05-03 LAB — CBC
HCT: 35.2 % — ABNORMAL LOW (ref 36.0–46.0)
Hemoglobin: 11 g/dL — ABNORMAL LOW (ref 12.0–15.0)
MCH: 30.1 pg (ref 26.0–34.0)
MCHC: 31.3 g/dL (ref 30.0–36.0)
MCV: 96.4 fL (ref 80.0–100.0)
Platelets: 234 K/uL (ref 150–400)
RBC: 3.65 MIL/uL — ABNORMAL LOW (ref 3.87–5.11)
RDW: 15.9 % — ABNORMAL HIGH (ref 11.5–15.5)
WBC: 18.1 K/uL — ABNORMAL HIGH (ref 4.0–10.5)
nRBC: 0 % (ref 0.0–0.2)

## 2024-05-03 LAB — GLUCOSE, CAPILLARY
Glucose-Capillary: 163 mg/dL — ABNORMAL HIGH (ref 70–99)
Glucose-Capillary: 178 mg/dL — ABNORMAL HIGH (ref 70–99)
Glucose-Capillary: 192 mg/dL — ABNORMAL HIGH (ref 70–99)
Glucose-Capillary: 202 mg/dL — ABNORMAL HIGH (ref 70–99)

## 2024-05-03 MED ORDER — SIMETHICONE 80 MG PO CHEW
80.0000 mg | CHEWABLE_TABLET | Freq: Four times a day (QID) | ORAL | Status: DC
  Administered 2024-05-03 – 2024-05-06 (×14): 80 mg via ORAL
  Filled 2024-05-03 (×13): qty 1

## 2024-05-03 NOTE — Progress Notes (Signed)
°   05/03/24 1011  TOC Brief Assessment  Insurance and Status Reviewed  Patient has primary care physician Yes  Home environment has been reviewed resides in a private residence  Prior level of function: Independent  Prior/Current Home Services No current home services  Social Drivers of Health Review SDOH reviewed no interventions necessary  Readmission risk has been reviewed Yes  Transition of care needs no transition of care needs at this time

## 2024-05-03 NOTE — Plan of Care (Signed)
  Problem: Education: Goal: Knowledge of General Education information will improve Description: Including pain rating scale, medication(s)/side effects and non-pharmacologic comfort measures Outcome: Progressing   Problem: Health Behavior/Discharge Planning: Goal: Ability to manage health-related needs will improve Outcome: Progressing   Problem: Clinical Measurements: Goal: Ability to maintain clinical measurements within normal limits will improve Outcome: Progressing Goal: Will remain free from infection Outcome: Progressing Goal: Diagnostic test results will improve Outcome: Progressing Goal: Respiratory complications will improve Outcome: Progressing Goal: Cardiovascular complication will be avoided Outcome: Progressing   Problem: Activity: Goal: Risk for activity intolerance will decrease Outcome: Progressing   Problem: Nutrition: Goal: Adequate nutrition will be maintained Outcome: Progressing   Problem: Coping: Goal: Level of anxiety will decrease Outcome: Progressing   Problem: Elimination: Goal: Will not experience complications related to bowel motility Outcome: Progressing Goal: Will not experience complications related to urinary retention Outcome: Progressing   Problem: Pain Managment: Goal: General experience of comfort will improve and/or be controlled Outcome: Progressing   Problem: Safety: Goal: Ability to remain free from injury will improve Outcome: Progressing   Problem: Skin Integrity: Goal: Risk for impaired skin integrity will decrease Outcome: Progressing   Problem: Education: Goal: Ability to describe self-care measures that may prevent or decrease complications (Diabetes Survival Skills Education) will improve Outcome: Progressing Goal: Individualized Educational Video(s) Outcome: Progressing   Problem: Coping: Goal: Ability to adjust to condition or change in health will improve Outcome: Progressing   Problem: Fluid  Volume: Goal: Ability to maintain a balanced intake and output will improve Outcome: Progressing   Problem: Health Behavior/Discharge Planning: Goal: Ability to identify and utilize available resources and services will improve Outcome: Progressing Goal: Ability to manage health-related needs will improve Outcome: Progressing   Problem: Metabolic: Goal: Ability to maintain appropriate glucose levels will improve Outcome: Progressing   Problem: Nutritional: Goal: Maintenance of adequate nutrition will improve Outcome: Progressing Goal: Progress toward achieving an optimal weight will improve Outcome: Progressing   Problem: Skin Integrity: Goal: Risk for impaired skin integrity will decrease Outcome: Progressing   Problem: Tissue Perfusion: Goal: Adequacy of tissue perfusion will improve Outcome: Progressing   Problem: Education: Goal: Knowledge of the prescribed therapeutic regimen will improve Outcome: Progressing Goal: Understanding of sexual limitations or changes related to disease process or condition will improve Outcome: Progressing Goal: Individualized Educational Video(s) Outcome: Progressing   Problem: Self-Concept: Goal: Communication of feelings regarding changes in body function or appearance will improve Outcome: Progressing   Problem: Skin Integrity: Goal: Demonstration of wound healing without infection will improve Outcome: Progressing

## 2024-05-03 NOTE — Plan of Care (Signed)
   Problem: Clinical Measurements: Goal: Cardiovascular complication will be avoided Outcome: Progressing

## 2024-05-03 NOTE — Progress Notes (Signed)
 1 Day Post-Op Procedures (LRB): ROBOTIC ASSISTED TOTAL LAPAROSCOPIC HYSTERECTOMY BILATERAL SALPINGO-OOPHORECTOMY CONVERTED TO LAPAROTOMY FOR PARA-AORTIC LYMPH NODE DISSECTION (Bilateral) LEFT PELVIC AND PARA-AORTIC LYMPHADENECTOMY (Left) OMENTECTOMY  Subjective: Patient reports doing well. No nausea or emesis reported. Tolerating solid food at this time. Denies chest pain. Had mild shortness of breath when she woke up this am that has resolved. Continues to report mild wheezing. IS given with patient reporting use in the previous hospitalizations. Pain is controlled with medications. No flatus reported. No needs voiced.   Objective: Vital signs in last 24 hours: Temp:  [97.4 F (36.3 C)-99.8 F (37.7 C)] 99.8 F (37.7 C) (12/17 0534) Pulse Rate:  [81-98] 92 (12/17 0534) Resp:  [10-19] 15 (12/17 0534) BP: (103-179)/(52-91) 114/53 (12/17 0534) SpO2:  [90 %-100 %] 90 % (12/17 0734) Weight:  [205 lb 0.4 oz (93 kg)] 205 lb 0.4 oz (93 kg) (12/16 0926) Last BM Date : 05/01/24  Intake/Output from previous day: 12/16 0701 - 12/17 0700 In: 2917.2 [P.O.:120; I.V.:2497.2; IV Piggyback:300] Out: 2300 [Urine:2100; Blood:200]  Physical Examination: General: alert, cooperative, and no distress Resp: lungs are clear except for RUL mild wheezing, 98% on 4 L-decreased down to 2L Cardio: regular rate and rhythm, S1, S2 normal, no murmur, click, rub or gallop GI: incision: lap sites to the abdomen intact with dermabond with no active drainage, midline abdominal laparotomy incision with op site dressing in place with no drainage underneath and abdomen is soft, mildly tympanic on percussion more in upper quadrants, active bowel sounds Extremities: mild generalized non pitting edema, SCDs on  Labs: WBC/Hgb/Hct/Plts:  18.1/11.0/35.2/234 (12/17 0500) BUN/Cr/glu/ALT/AST/amyl/lip:  21/1.15/--/--/--/--/-- (12/17 0500)  Assessment: 73 y.o. s/p Procedures: ROBOTIC ASSISTED TOTAL LAPAROSCOPIC HYSTERECTOMY  BILATERAL SALPINGO-OOPHORECTOMY CONVERTED TO LAPAROTOMY FOR PARA-AORTIC LYMPH NODE DISSECTION LEFT PELVIC AND PARA-AORTIC LYMPHADENECTOMY OMENTECTOMY: stable Pain:  Pain is well-controlled on PRN medications.  Heme: Hgb 11.0 and Hct 35.2 this am-appropriate given preop values and surgical losses. Continue to monitor with am labs while inpatient.   ID: WBC 18.1. Given decadron  and intra-op abxs. No evidence of infection at this time.   CV: BP and HR stable. Continue to monitor.  GI:  Tolerating po: Yes. Antiemetics ordered if needed.  GU: Due to void since foley removal. Creatinine 1.15 this am-slightly lower than baseline. Adequate urine output overnight.    FEN: No critical values on am labs.  Endo: Diabetes mellitus Type II, under good control.  CBG: CBG (last 3)  Recent Labs    05/02/24 1849 05/02/24 2215 05/03/24 0746  GLUCAP 144* 145* 163*     Prophylaxis: SCDs and heparin  ordered. Will need to be on 4 weeks of DVT prophylaxis post-op at discharge.  Plan: Diet as tolerated Start use of IS Monitor O2 sats Increase mobility with assist Monitor urine output If diet tolerated and voiding without difficulty later today, could consider saline locking IV Continue with current plan of care.   LOS: 1 day    Christina Solis 05/03/2024, 7:40 AM

## 2024-05-04 LAB — GLUCOSE, CAPILLARY
Glucose-Capillary: 88 mg/dL (ref 70–99)
Glucose-Capillary: 128 mg/dL — ABNORMAL HIGH (ref 70–99)
Glucose-Capillary: 91 mg/dL (ref 70–99)
Glucose-Capillary: 121 mg/dL — ABNORMAL HIGH (ref 70–99)

## 2024-05-04 LAB — BASIC METABOLIC PANEL WITH GFR
Anion gap: 7 (ref 5–15)
BUN: 21 mg/dL (ref 8–23)
CO2: 27 mmol/L (ref 22–32)
Calcium: 8.8 mg/dL — ABNORMAL LOW (ref 8.9–10.3)
Chloride: 105 mmol/L (ref 98–111)
Creatinine, Ser: 1.29 mg/dL — ABNORMAL HIGH (ref 0.44–1.00)
GFR, Estimated: 44 mL/min — ABNORMAL LOW (ref >60)
Glucose, Bld: 85 mg/dL (ref 70–99)
Potassium: 4.3 mmol/L (ref 3.5–5.1)
Sodium: 140 mmol/L (ref 135–145)

## 2024-05-04 LAB — CBC
HCT: 30.9 % — ABNORMAL LOW (ref 36.0–46.0)
Hemoglobin: 9.6 g/dL — ABNORMAL LOW (ref 12.0–15.0)
MCH: 30.4 pg (ref 26.0–34.0)
MCHC: 31.1 g/dL (ref 30.0–36.0)
MCV: 97.8 fL (ref 80.0–100.0)
Platelets: 202 K/uL (ref 150–400)
RBC: 3.16 MIL/uL — ABNORMAL LOW (ref 3.87–5.11)
RDW: 16.3 % — ABNORMAL HIGH (ref 11.5–15.5)
WBC: 12.5 K/uL — ABNORMAL HIGH (ref 4.0–10.5)
nRBC: 0 % (ref 0.0–0.2)

## 2024-05-04 LAB — HEMOGLOBIN AND HEMATOCRIT, BLOOD
HCT: 30.1 % — ABNORMAL LOW (ref 36.0–46.0)
Hemoglobin: 9.2 g/dL — ABNORMAL LOW (ref 12.0–15.0)

## 2024-05-04 LAB — CYTOLOGY - NON PAP

## 2024-05-04 LAB — CREATININE, SERUM
Creatinine, Ser: 1.36 mg/dL — ABNORMAL HIGH (ref 0.44–1.00)
GFR, Estimated: 41 mL/min — ABNORMAL LOW (ref >60)

## 2024-05-04 MED ORDER — METHOCARBAMOL 500 MG PO TABS
500.0000 mg | ORAL_TABLET | Freq: Four times a day (QID) | ORAL | Status: DC | PRN
  Administered 2024-05-04 – 2024-05-06 (×3): 500 mg via ORAL
  Filled 2024-05-04 (×3): qty 1

## 2024-05-04 NOTE — Plan of Care (Signed)
°  Problem: Education: Goal: Knowledge of General Education information will improve Description: Including pain rating scale, medication(s)/side effects and non-pharmacologic comfort measures Outcome: Progressing   Problem: Health Behavior/Discharge Planning: Goal: Ability to manage health-related needs will improve Outcome: Progressing   Problem: Clinical Measurements: Goal: Ability to maintain clinical measurements within normal limits will improve Outcome: Progressing Goal: Will remain free from infection Outcome: Progressing Goal: Diagnostic test results will improve Outcome: Progressing Goal: Respiratory complications will improve Outcome: Progressing Goal: Cardiovascular complication will be avoided Outcome: Progressing   Problem: Activity: Goal: Risk for activity intolerance will decrease Outcome: Progressing   Problem: Nutrition: Goal: Adequate nutrition will be maintained Outcome: Progressing   Problem: Pain Managment: Goal: General experience of comfort will improve and/or be controlled Outcome: Progressing   Problem: Safety: Goal: Ability to remain free from injury will improve Outcome: Progressing   Problem: Coping: Goal: Ability to adjust to condition or change in health will improve Outcome: Progressing   Problem: Education: Goal: Ability to describe self-care measures that may prevent or decrease complications (Diabetes Survival Skills Education) will improve Outcome: Progressing Goal: Individualized Educational Video(s) Outcome: Progressing   Problem: Metabolic: Goal: Ability to maintain appropriate glucose levels will improve Outcome: Progressing   Problem: Tissue Perfusion: Goal: Adequacy of tissue perfusion will improve Outcome: Progressing   Problem: Skin Integrity: Goal: Risk for impaired skin integrity will decrease Outcome: Progressing   Problem: Self-Concept: Goal: Communication of feelings regarding changes in body function or  appearance will improve Outcome: Progressing

## 2024-05-04 NOTE — Progress Notes (Signed)
 2 Days Post-Op Procedures (LRB): ROBOTIC ASSISTED TOTAL LAPAROSCOPIC HYSTERECTOMY BILATERAL SALPINGO-OOPHORECTOMY CONVERTED TO LAPAROTOMY FOR PARA-AORTIC LYMPH NODE DISSECTION (Bilateral) LEFT PELVIC AND PARA-AORTIC LYMPHADENECTOMY (Left) OMENTECTOMY  Subjective: Patient reports having moderate gas pains, abdominal cramping, abdominal spasms. No flatus or BM. No nausea or emesis reported. Continues to tolerate diet. Ambulated in the halls yesterday several times and tolerated this well. No respiratory issues. Denies chest pain. Continues to use IS. Voiding without difficulty. Patient reports a hx of an irregular heart rhythm.   Objective: Vital signs in last 24 hours: Temp:  [98 F (36.7 C)-98.6 F (37 C)] 98.6 F (37 C) (12/18 0624) Pulse Rate:  [83-93] 93 (12/18 0624) Resp:  [17-18] 17 (12/18 0624) BP: (110-134)/(58-63) 110/58 (12/18 0624) SpO2:  [90 %-95 %] 92 % (12/18 0939) Last BM Date : 05/01/24  Intake/Output from previous day: 12/17 0701 - 12/18 0700 In: 2223.9 [P.O.:1800; I.V.:423.9] Out: 2200 [Urine:2200]  Physical Examination: General: alert, cooperative, and no distress Resp: lungs clear, no wheezing Cardio: irregularly irregular rhythm GI: incision: lap sites to the abdomen intact with dermabond with no active drainage, midline abdominal laparotomy incision with op site dressing in place with no drainage underneath and abdomen is soft, mildly tympanic on percussion more in upper quadrants, active bowel sounds Extremities: mild generalized non pitting edema, SCDs on  Labs: WBC/Hgb/Hct/Plts:  12.5/9.6/30.9/202 (12/18 9490) BUN/Cr/glu/ALT/AST/amyl/lip:  21/1.29/--/--/--/--/-- (12/18 9490)  Assessment: 73 y.o. s/p Procedures: ROBOTIC ASSISTED TOTAL LAPAROSCOPIC HYSTERECTOMY BILATERAL SALPINGO-OOPHORECTOMY CONVERTED TO LAPAROTOMY FOR PARA-AORTIC LYMPH NODE DISSECTION LEFT PELVIC AND PARA-AORTIC LYMPHADENECTOMY OMENTECTOMY: stable Pain:  Pain is well-controlled on PRN  medications.  Heme: Hgb 9.6 and Hct 30.9 this am. Will repeat later today. Continue to monitor with am labs while inpatient.   ID: WBC 12.5. Given decadron  and intra-op abxs. No evidence of infection at this time.   CV: BP and HR stable. Continue to monitor.  GI:  Tolerating po: Yes. Antiemetics ordered if needed.  GU: Voiding. Creatinine 1.29 this am-around baseline. Adequate urine output overnight.    FEN: No critical values on am labs.  Endo: Diabetes mellitus Type II, under good control.  CBG: CBG (last 3)  Recent Labs    05/03/24 1621 05/03/24 2102 05/04/24 0732  GLUCAP 192* 202* 88     Prophylaxis: SCDs and heparin  ordered. Will need to be on 4 weeks of DVT prophylaxis post-op at discharge.  Plan: Recheck labs later today to assess stability Added robaxin  prn. Continue simethicone  EKG to further evaluate heart rhythm Continue diet as tolerated Encouraged IS Increase mobility with assist Monitor urine output Continue with current plan of care Can be discharged home when meeting milestones, evidence of bowel function return   LOS: 2 days    Christina Solis 05/04/2024, 9:53 AM

## 2024-05-05 LAB — CBC
HCT: 29.9 % — ABNORMAL LOW (ref 36.0–46.0)
Hemoglobin: 9.4 g/dL — ABNORMAL LOW (ref 12.0–15.0)
MCH: 30.5 pg (ref 26.0–34.0)
MCHC: 31.4 g/dL (ref 30.0–36.0)
MCV: 97.1 fL (ref 80.0–100.0)
Platelets: 202 K/uL (ref 150–400)
RBC: 3.08 MIL/uL — ABNORMAL LOW (ref 3.87–5.11)
RDW: 15.9 % — ABNORMAL HIGH (ref 11.5–15.5)
WBC: 9.1 K/uL (ref 4.0–10.5)
nRBC: 0 % (ref 0.0–0.2)

## 2024-05-05 LAB — BASIC METABOLIC PANEL WITH GFR
Anion gap: 6 (ref 5–15)
BUN: 21 mg/dL (ref 8–23)
CO2: 27 mmol/L (ref 22–32)
Calcium: 8.9 mg/dL (ref 8.9–10.3)
Chloride: 104 mmol/L (ref 98–111)
Creatinine, Ser: 1.25 mg/dL — ABNORMAL HIGH (ref 0.44–1.00)
GFR, Estimated: 46 mL/min — ABNORMAL LOW
Glucose, Bld: 104 mg/dL — ABNORMAL HIGH (ref 70–99)
Potassium: 4.4 mmol/L (ref 3.5–5.1)
Sodium: 138 mmol/L (ref 135–145)

## 2024-05-05 LAB — GLUCOSE, CAPILLARY
Glucose-Capillary: 104 mg/dL — ABNORMAL HIGH (ref 70–99)
Glucose-Capillary: 105 mg/dL — ABNORMAL HIGH (ref 70–99)
Glucose-Capillary: 150 mg/dL — ABNORMAL HIGH (ref 70–99)
Glucose-Capillary: 159 mg/dL — ABNORMAL HIGH (ref 70–99)

## 2024-05-05 MED ORDER — BISACODYL 10 MG RE SUPP
10.0000 mg | Freq: Once | RECTAL | Status: AC
  Administered 2024-05-05: 10 mg via RECTAL
  Filled 2024-05-05: qty 1

## 2024-05-05 MED ORDER — LUBIPROSTONE 24 MCG PO CAPS
24.0000 ug | ORAL_CAPSULE | Freq: Two times a day (BID) | ORAL | Status: DC
  Administered 2024-05-05 – 2024-05-06 (×2): 24 ug via ORAL
  Filled 2024-05-05 (×3): qty 1

## 2024-05-05 NOTE — Plan of Care (Signed)
   Problem: Clinical Measurements: Goal: Respiratory complications will improve Outcome: Progressing

## 2024-05-05 NOTE — Progress Notes (Signed)
 3 Days Post-Op Procedures (LRB): ROBOTIC ASSISTED TOTAL LAPAROSCOPIC HYSTERECTOMY BILATERAL SALPINGO-OOPHORECTOMY CONVERTED TO LAPAROTOMY FOR PARA-AORTIC LYMPH NODE DISSECTION (Bilateral) LEFT PELVIC AND PARA-AORTIC LYMPHADENECTOMY (Left) OMENTECTOMY  Subjective: Patient reports having continued gas pains. No flatus or BM. No nausea or emesis. Feels breathing is at baseline. Ambulating in the halls and sitting in chair. Tolerating solid diet. Voiding without difficulty. Denies chest pain.    Objective: Vital signs in last 24 hours: Temp:  [97.8 F (36.6 C)-99 F (37.2 C)] 99 F (37.2 C) (12/19 0604) Pulse Rate:  [94-102] 97 (12/19 0604) Resp:  [15-18] 15 (12/19 0604) BP: (96-123)/(56-63) 105/63 (12/19 0604) SpO2:  [89 %-98 %] 93 % (12/19 0834) Last BM Date : 05/01/24  Intake/Output from previous day: 12/18 0701 - 12/19 0700 In: 540 [P.O.:540] Out: 1700 [Urine:1700]  Physical Examination: General: alert, cooperative, and no distress Resp: lungs clear and mildly diminished in the bases Cardio: regular rate and rhythm GI: incision: lap sites to the abdomen intact with dermabond with no active drainage, midline abdominal laparotomy incision with op site dressing in place with no drainage underneath and abdomen is soft, mildly tympanic on percussion more in upper quadrants, active bowel sounds Extremities: mild generalized non pitting edema, SCDs on  Labs: WBC/Hgb/Hct/Plts:  9.1/9.4/29.9/202 (12/19 9488) BUN/Cr/glu/ALT/AST/amyl/lip:  21/1.25/--/--/--/--/-- (12/19 9488)  Assessment: 73 y.o. s/p Procedures: ROBOTIC ASSISTED TOTAL LAPAROSCOPIC HYSTERECTOMY BILATERAL SALPINGO-OOPHORECTOMY CONVERTED TO LAPAROTOMY FOR PARA-AORTIC LYMPH NODE DISSECTION LEFT PELVIC AND PARA-AORTIC LYMPHADENECTOMY OMENTECTOMY: stable Pain:  Pain is well-controlled on PRN medications.  Heme: Hgb 9.4 and Hct 29.9 this am. Continue to monitor with am labs while inpatient.   ID: WBC 9.1. Given decadron  and  intra-op abxs. No evidence of infection at this time.   CV: BP and HR stable. Continue to monitor.  GI:  Tolerating po: Yes. Antiemetics ordered if needed.  GU: Voiding. Creatinine 1.25 this am-around baseline. Adequate urine output overnight.    FEN: No critical values on am labs.  Endo: Diabetes mellitus Type II, under good control.  CBG: CBG (last 3)  Recent Labs    05/04/24 1702 05/04/24 2054 05/05/24 0749  GLUCAP 91 121* 104*     Prophylaxis: SCDs and heparin  ordered. Will need to be on 4 weeks of DVT prophylaxis post-op at discharge.  Plan: Dulcolax suppository ordered Home med Amitiza ordered Continue diet as tolerated Encouraged IS Increase mobility with assist Monitor urine output Continue with current plan of care Can be discharged home when meeting milestones, evidence of bowel function return   LOS: 3 days    Christina Solis 05/05/2024, 9:28 AM

## 2024-05-06 LAB — BASIC METABOLIC PANEL WITH GFR
Anion gap: 7 (ref 5–15)
BUN: 17 mg/dL (ref 8–23)
CO2: 26 mmol/L (ref 22–32)
Calcium: 9.1 mg/dL (ref 8.9–10.3)
Chloride: 106 mmol/L (ref 98–111)
Creatinine, Ser: 1.1 mg/dL — ABNORMAL HIGH (ref 0.44–1.00)
GFR, Estimated: 53 mL/min — ABNORMAL LOW
Glucose, Bld: 98 mg/dL (ref 70–99)
Potassium: 4.3 mmol/L (ref 3.5–5.1)
Sodium: 140 mmol/L (ref 135–145)

## 2024-05-06 LAB — CBC
HCT: 30.3 % — ABNORMAL LOW (ref 36.0–46.0)
Hemoglobin: 9.6 g/dL — ABNORMAL LOW (ref 12.0–15.0)
MCH: 30.2 pg (ref 26.0–34.0)
MCHC: 31.7 g/dL (ref 30.0–36.0)
MCV: 95.3 fL (ref 80.0–100.0)
Platelets: 222 K/uL (ref 150–400)
RBC: 3.18 MIL/uL — ABNORMAL LOW (ref 3.87–5.11)
RDW: 15.3 % (ref 11.5–15.5)
WBC: 11 K/uL — ABNORMAL HIGH (ref 4.0–10.5)
nRBC: 0 % (ref 0.0–0.2)

## 2024-05-06 LAB — GLUCOSE, CAPILLARY
Glucose-Capillary: 123 mg/dL — ABNORMAL HIGH (ref 70–99)
Glucose-Capillary: 114 mg/dL — ABNORMAL HIGH (ref 70–99)

## 2024-05-06 MED ORDER — APIXABAN 2.5 MG PO TABS: 2.5000 mg | ORAL_TABLET | Freq: Two times a day (BID) | ORAL | 0 refills | Status: DC

## 2024-05-06 NOTE — Plan of Care (Signed)

## 2024-05-06 NOTE — Plan of Care (Signed)
   Problem: Nutrition: Goal: Adequate nutrition will be maintained Outcome: Adequate for Discharge

## 2024-05-06 NOTE — Discharge Summary (Signed)
 Physician Discharge Summary  Patient ID: Christina Solis MRN: [id number] DOB/AGE: 08/31/1950 73 y.o.  Admit date: 05/02/2024 Discharge date: 05/06/2024  Admission Diagnoses: Ovarian mass  Discharge Diagnoses:  Principal Problem:   Ovarian mass Active Problems:   Pelvic mass in female   Discharged Condition: good  Hospital Course: On 05/02/2024, the patient underwent the following: Procedures: ROBOTIC ASSISTED TOTAL LAPAROSCOPIC HYSTERECTOMY BILATERAL SALPINGO-OOPHORECTOMY CONVERTED TO LAPAROTOMY FOR PARA-AORTIC LYMPH NODE DISSECTION LEFT PELVIC AND PARA-AORTIC LYMPHADENECTOMY OMENTECTOMY.   The postoperative course was remarkable for slow return of bowel function.  She was also noted to have an arrhythmia and mild postoperative anemia that remained stable.  EKG changes were stable compared to preoperative exam. Her glycemic control remained adequate.  She was discharged to home on postoperative day 4 tolerating a regular diet.   Consults: None  Significant Diagnostic Studies: None  Treatments: surgery: see above  Discharge Exam: Blood pressure (!) 147/66, pulse 87, temperature 98.6 F (37 C), temperature source Oral, resp. rate 19, height 5' 2 (1.575 m), weight 93 kg, SpO2 100%. General appearance: alert GI: soft, non-tender; bowel sounds normal; no masses,  no organomegaly Extremities: extremities normal, atraumatic, no cyanosis or edema and Homans sign is negative, no sign of DVT Incision/Wound: C/D/I  Disposition: Discharge disposition: 01-Home or Self Care       Discharge Instructions     Call MD for:  extreme fatigue   Complete by: As directed    Call MD for:  persistant dizziness or light-headedness   Complete by: As directed    Call MD for:  persistant nausea and vomiting   Complete by: As directed    Call MD for:  redness, tenderness, or signs of infection (pain, swelling, redness, odor or green/yellow discharge around incision site)   Complete by: As  directed    Call MD for:  severe uncontrolled pain   Complete by: As directed    Call MD for:  temperature >100.4   Complete by: As directed    Remove dressing in 24 hours   Complete by: As directed       Allergies as of 05/06/2024       Reactions   Coconut (cocos Nucifera) Hives        Medication List     TAKE these medications    albuterol  108 (90 Base) MCG/ACT inhaler Commonly known as: VENTOLIN  HFA Inhale 1 puff into the lungs every 6 (six) hours as needed for wheezing or shortness of breath. ProAir    albuterol  (2.5 MG/3ML) 0.083% nebulizer solution Commonly known as: PROVENTIL  Take 2.5 mg by nebulization every 6 (six) hours as needed for wheezing or shortness of breath.   apixaban  2.5 MG Tabs tablet Commonly known as: ELIQUIS  Take 1 tablet (2.5 mg total) by mouth 2 (two) times daily.   budesonide -formoterol  160-4.5 MCG/ACT inhaler Commonly known as: SYMBICORT  Inhale 2 puffs into the lungs daily as needed (wheezing/sob).   cabergoline  0.5 MG tablet Commonly known as: DOSTINEX  Take 0.5 tablets (0.25 mg total) by mouth 2 (two) times a week.   clopidogrel  75 MG tablet Commonly known as: PLAVIX  Take 1 tablet (75 mg total) by mouth daily.   dapagliflozin  propanediol 10 MG Tabs tablet Commonly known as: FARXIGA  Take 1 tablet (10 mg total) by mouth daily.   diclofenac Sodium 1 % Gel Commonly known as: VOLTAREN Apply 1 Application topically daily as needed (pain).   ferrous sulfate  325 (65 FE) MG tablet Take 325 mg by mouth daily.  furosemide  40 MG tablet Commonly known as: LASIX  Take 1 tablet (40 mg total) by mouth daily.   Insulin  Lispro Prot & Lispro (75-25) 100 UNIT/ML Kwikpen Commonly known as: HumaLOG  Mix 75/25 KwikPen Inject 8 Units into the skin in the morning and at bedtime.   Insulin  Pen Needle 32G X 4 MM Misc 1 Device by Does not apply route in the morning and at bedtime.   lisinopril -hydrochlorothiazide  20-12.5 MG tablet Commonly known  as: ZESTORETIC  Take 1 tablet by mouth daily.   lubiprostone  24 MCG capsule Commonly known as: AMITIZA  Take 24 mcg by mouth 2 (two) times daily with a meal.   metoprolol  tartrate 100 MG tablet Commonly known as: LOPRESSOR  Take one tablet two hours prior to cardiac CTA.   metoprolol  tartrate 50 MG tablet Commonly known as: LOPRESSOR  Take tablet (50mg ) by mouth TWO hours prior to your cardiac CT scan.   mometasone  50 MCG/ACT nasal spray Commonly known as: NASONEX  Place 2 sprays into the nose as needed (allergies).   multivitamin capsule Take 1 capsule by mouth daily.   naproxen 250 MG tablet Commonly known as: NAPROSYN Take 250 mg by mouth daily as needed for mild pain (pain score 1-3) or moderate pain (pain score 4-6).   nortriptyline  25 MG capsule Commonly known as: PAMELOR  Take 25 mg by mouth at bedtime.   Olopatadine  HCl 0.2 % Soln Apply 1 drop to eye daily. What changed: how to take this   oxyCODONE -acetaminophen  10-325 MG tablet Commonly known as: PERCOCET Take 1 tablet by mouth in the morning, at noon, in the evening, and at bedtime.   pregabalin  200 MG capsule Commonly known as: LYRICA  Take 200 mg by mouth 3 (three) times daily.   promethazine  25 MG tablet Commonly known as: PHENERGAN  Take 25 mg by mouth every 6 (six) hours as needed for nausea or vomiting.   rosuvastatin  40 MG tablet Commonly known as: CRESTOR  Take 1 tablet (40 mg total) by mouth daily. What changed: when to take this   Spiriva  Respimat 2.5 MCG/ACT Aers Generic drug: Tiotropium Bromide  INHALE 2 PUFFS INTO THE LUNGS DAILY What changed:  when to take this reasons to take this   temazepam 7.5 MG capsule Commonly known as: RESTORIL Take 7.5 mg by mouth at bedtime.   tirzepatide  10 MG/0.5ML Pen Commonly known as: MOUNJARO  Inject 10 mg into the skin once a week. What changed: additional instructions   tiZANidine  4 MG tablet Commonly known as: ZANAFLEX  Take 4 mg by mouth 3 (three)  times daily.   Vitamin D (Ergocalciferol) 1.25 MG (50000 UNIT) Caps capsule Commonly known as: DRISDOL Take 50,000 Units by mouth every Tuesday.        Follow-up Information     [name], MD Follow up on 05/29/2024.   Specialty: Gynecologic Oncology Why: at 1:30 pm at the [facility] information: [address] [place] [address] [phone]                 Signed: [place] 05/06/2024, 10:32 AM

## 2024-05-08 ENCOUNTER — Telehealth: Payer: Self-pay | Admitting: *Deleted

## 2024-05-08 NOTE — Telephone Encounter (Signed)
 Spoke with [name] this morning. She states she is eating, drinking and urinating well. She has had a BM and is passing gas. She is taking senokot as prescribed and encouraged her to drink plenty of water . She denies fever or chills. Incisions are dry and intact. She rates her pain 4/10. Her pain is controlled with oxycodone .    Instructed to call office with any fever, chills, purulent drainage, uncontrolled pain or any other questions or concerns. Patient verbalizes understanding.   Pt aware of post op appointments as well as the office number [phone] and after hours number [phone] to call if she has any questions or concerns

## 2024-05-09 ENCOUNTER — Telehealth: Payer: Self-pay | Admitting: Oncology

## 2024-05-09 NOTE — Telephone Encounter (Signed)
 Christina Solis and reviewed her final pathology results per Eleanor Epps, NP.  Advised her that Dr. Eldonna will review the results with her next week and will also let her know if there are any recommendations for treatment.  Christina Solis verbalized understanding and agreement.

## 2024-05-15 ENCOUNTER — Other Ambulatory Visit: Payer: Self-pay

## 2024-05-23 ENCOUNTER — Telehealth: Payer: Self-pay | Admitting: Psychiatry

## 2024-05-23 NOTE — Telephone Encounter (Signed)
 Called pt with pathology results. Likely will plan close observation. Will discuss at tumor board. Keep postop next week as scheduled.

## 2024-05-25 ENCOUNTER — Encounter: Payer: Self-pay | Admitting: Cardiology

## 2024-05-25 ENCOUNTER — Ambulatory Visit: Attending: Cardiology | Admitting: Cardiology

## 2024-05-25 ENCOUNTER — Other Ambulatory Visit (HOSPITAL_COMMUNITY): Payer: Self-pay

## 2024-05-25 VITALS — BP 90/44 | HR 79 | Ht 62.0 in | Wt 207.2 lb

## 2024-05-25 DIAGNOSIS — I25118 Atherosclerotic heart disease of native coronary artery with other forms of angina pectoris: Secondary | ICD-10-CM | POA: Diagnosis not present

## 2024-05-25 DIAGNOSIS — I6523 Occlusion and stenosis of bilateral carotid arteries: Secondary | ICD-10-CM

## 2024-05-25 DIAGNOSIS — I519 Heart disease, unspecified: Secondary | ICD-10-CM | POA: Diagnosis not present

## 2024-05-25 MED ORDER — ASPIRIN 81 MG PO TBEC
81.0000 mg | DELAYED_RELEASE_TABLET | Freq: Every day | ORAL | 3 refills | Status: AC
  Filled 2024-05-25: qty 90, 90d supply, fill #0

## 2024-05-25 NOTE — Patient Instructions (Signed)
 Medication Instructions:  STOP Eliquis   STOP Plavix    START Aspirin  81 mg daily   *If you need a refill on your cardiac medications before your next appointment, please call your pharmacy*   Testing/Procedures: Carotid Duplex  Your physician has requested that you have a carotid duplex. This test is an ultrasound of the carotid arteries in your neck. It looks at blood flow through these arteries that supply the brain with blood. Allow one hour for this exam. There are no restrictions or special instructions.   Follow-Up: At [facility], you and your health needs are our priority.  As part of our continuing mission to provide you with exceptional heart care, our providers are all part of one team.  This team includes your primary Cardiologist (physician) and Advanced Practice Providers or APPs (Physician Assistants and Nurse Practitioners) who all work together to provide you with the care you need, when you need it.  Your next appointment:   3 month(s)  Provider:   Newman JINNY Lawrence, MD    We recommend signing up for the patient portal called MyChart.  Sign up information is provided on this After Visit Summary.  MyChart is used to connect with patients for Virtual Visits (Telemedicine).  Patients are able to view lab/test results, encounter notes, upcoming appointments, etc.  Non-urgent messages can be sent to your provider as well.   To learn more about what you can do with MyChart, go to [email or web address].

## 2024-05-25 NOTE — Progress Notes (Signed)
 " Cardiology Office Note:  .   Date:  05/25/2024  ID:  Russell CHRISTELLA Daring, DOB 09-10-1950, MRN [id number] PCP: Joshua Santana CROME, NP  [facility] Providers Cardiologist:  Newman Lawrence, MD PCP: Joshua Santana CROME, NP  Chief Complaint  Patient presents with   Carotid artery disease     Christina Solis is a 74 y.o. female with hypertension, hyperlipidemia, prior tobacco abuse, mod carotid artery stenosis, PAD, mild LV systolic dysfunction   History of Present Illness  Patient recently underwent hysterectomy and bilateral salpingo-oophorectomy for cancerous ovarian mass, but did not need any chemotherapy.  Patient is recovering from surgery well.  She was started on Eliquis  2.5 mg twice daily at discharge.  While there was mention of A-fib in the discharge summary, there is no documented evidence of A-fib.  With initiation of Eliquis , her Plavix  was stopped, which she was previously taking for carotid artery disease.  Blood pressure is low today, without any significant lightheadedness symptoms.  Blood pressure generally higher than this.  She did have postoperative anemia.  She denies any chest pain, shortness of breath, leg edema has improved with Lasix .  She denies any TIA/stroke symptoms.     Vitals:   05/25/24 1125  BP: (!) 90/44  Pulse: 79  SpO2: 97%      Review of Systems  Cardiovascular:  Negative for chest pain, dyspnea on exertion, leg swelling, palpitations and syncope.        Studies Reviewed: SABRA         Coronary CT angiogram 04/2024: 1.  Mild nonobstructive coronary disease, CADRADS=2. 2. Coronary calcium  score of 661. This was 95th percentile for age-, sex, and race-matched controls. 3. Normal coronary origin. No clear PDA off of either circumflex or RCA. RCA is small caliber and terminates at typical origin of PDA. LCx has segment that feeds large portion of posterior LV wall. RECOMMENDATIONS: CAD-RADS 2: Mild non-obstructive CAD (25-49%).  Consider non-atherosclerotic causes of chest pain. Consider preventive therapy and risk factor modification.    Echocardiogram 03/2024:  1. Septal dyssynchrony due to LBBB; overall EF 40-45.   2. Left ventricular ejection fraction, by estimation, is 40 to 45%. The  left ventricle has mildly decreased function. The left ventricle has no  regional wall motion abnormalities. There is mild left ventricular  hypertrophy. Left ventricular diastolic  parameters are indeterminate.   3. Right ventricular systolic function is normal. The right ventricular  size is normal.   4. Left atrial size was mildly dilated.   5. The mitral valve is normal in structure. Trivial mitral valve  regurgitation. No evidence of mitral stenosis.   6. The aortic valve is tricuspid. Aortic valve regurgitation is not  visualized. No aortic stenosis is present.   7. The inferior vena cava is normal in size with greater than 50%  respiratory variability, suggesting right atrial pressure of 3 mmHg.   Carotid artery duplex 04/29/2022:  Duplex suggests stenosis in the right internal carotid artery (50-69%),  upper end of spectrum. Right ECA stenosis of <50%.  Duplex suggests stenosis in the left internal carotid artery (50-69%),  lower end of spectrum.  Antegrade right vertebral artery flow. Antegrade left vertebral artery  flow.  Compared to 02/04/2021, no significant change. Follow up in six months is  appropriate if clinically indicated.   Labs 04/2024: Chol 119, TG 178, HDL 34, LDL 60 HbA1C 5.5% Hb 9.6 Cr 1.1, eGFR 53     Physical Exam Vitals and nursing note  reviewed.  Constitutional:      General: She is not in acute distress. Neck:     Vascular: No JVD.  Cardiovascular:     Rate and Rhythm: Normal rate and regular rhythm.     Heart sounds: Normal heart sounds. No murmur heard. Pulmonary:     Effort: Pulmonary effort is normal.     Breath sounds: Normal breath sounds. No wheezing or rales.   Musculoskeletal:     Right lower leg: No edema.     Left lower leg: No edema.      VISIT DIAGNOSES:   ICD-10-CM   1. Bilateral carotid artery stenosis  I65.23 VAS US  CAROTID    2. Coronary artery disease of native artery of native heart with stable angina pectoris  I25.118     3. Systolic dysfunction  I51.9        KMARI BRIAN is a 74 y.o. female with hypertension, hyperlipidemia, prior tobacco abuse, mod carotid artery stenosis, PAD, mild LV systolic dysfunction   Assessment & Plan  LV systolic dysfunction: Mild, in the setting of LBBB.  Coronary CTA with nonobstructive coronary artery disease.  In absence of significant heart failure symptoms, reasonable to continue current medical therapy, including Farxiga , lisinopril -hydrochlorothiazide , Lasix .  Carotid artery disease: Previously moderate. Patient was previously on Plavix , which was stopped when she was started on Eliquis  2.5 mg daily.  There is no documented evidence of A-fib.  Therefore, I will stop Eliquis .  Given recent postop anemia, will start on aspirin  81 mg daily, instead of resuming Plavix .  CAD: Mild, nonobstructive with high amounts of coronary calcification. Recommend aggressive medical therapy including aspirin  81 mg daily.  Continue rosuvastatin  5 mg daily.    Meds ordered this encounter  Medications   aspirin  EC 81 MG tablet    Sig: Take 1 tablet (81 mg total) by mouth daily. Swallow whole.    Dispense:  90 tablet    Refill:  3     F/u in 3 months  Signed, Newman JINNY Lawrence, MD  "

## 2024-05-26 NOTE — Progress Notes (Signed)
 Attempted to contact the patient to discuss upcoming appointments and recommended health maintenance screenings for the year.  A [facility] Specialist has reviewed the patient's chart to identify any gaps in care and outstanding preventive health needs. However, we were unable to reach the patient to review or schedule the necessary follow-up appointments and screenings.  Interpreter Services: Assistance used during this phone call. No  Left message: no   MyChart message sent: no   Voicemail is full.  Comments:

## 2024-05-29 ENCOUNTER — Inpatient Hospital Stay: Attending: Psychiatry | Admitting: Psychiatry

## 2024-05-29 ENCOUNTER — Other Ambulatory Visit: Payer: Self-pay | Admitting: Oncology

## 2024-05-29 VITALS — BP 126/68 | HR 81 | Temp 98.2°F | Resp 19 | Wt 204.4 lb

## 2024-05-29 DIAGNOSIS — Z90722 Acquired absence of ovaries, bilateral: Secondary | ICD-10-CM | POA: Insufficient documentation

## 2024-05-29 DIAGNOSIS — Z7189 Other specified counseling: Secondary | ICD-10-CM

## 2024-05-29 DIAGNOSIS — Z9079 Acquired absence of other genital organ(s): Secondary | ICD-10-CM | POA: Insufficient documentation

## 2024-05-29 DIAGNOSIS — N838 Other noninflammatory disorders of ovary, fallopian tube and broad ligament: Secondary | ICD-10-CM

## 2024-05-29 DIAGNOSIS — Z90710 Acquired absence of both cervix and uterus: Secondary | ICD-10-CM | POA: Insufficient documentation

## 2024-05-29 DIAGNOSIS — C562 Malignant neoplasm of left ovary: Secondary | ICD-10-CM | POA: Diagnosis present

## 2024-05-29 NOTE — Progress Notes (Unsigned)
 Gynecologic Oncology Return Clinic Visit  Date of Service: 05/29/2024 Referring Provider: Evalene Smiles, MD   Assessment & Plan: Christina Solis is a 74 y.o. 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  Postop: - Pt recovering well from surgery and healing appropriately postoperatively - Ongoing postoperative expectations and precautions reviewed. Continue with no lifting >10lbs through 6 weeks postoperatively  Ovarian cancer: - Intraoperative findings and pathology results reviewed. - Tumor board discussion reviewed. NCCN recommendations reviewed. - For grade 2, could consider observation or systemic chemotherapy. Given that this is focal invasive carcinoma arising in a borderline tumor, would favor observation.  - Surveillance removed. Will follow initially q9mo. - No tumor marker available. Will get CT chest now to complete staging imaging and plan for repeat imagine in 36mo. If reassuring, may space out to q79mo.  - Given epithelial ovarian cancer, recommend referral to genetics. This is scheduled for 06/01/23. - Request MMR, ER/PR testing on tumor   RTC 36mo.  Hoy Masters, MD Gynecologic Oncology   Medical Decision Making I personally spent  TOTAL 25 minutes face-to-face and non-face-to-face in the care of this patient, which includes all pre, intra, and post visit time on the date of service. The discussion of management of ovarian cancer is beyond the scope of routine postoperative care.   ----------------------- Reason for Visit: Postop/Treatment discussion  Treatment History: Oncology History  Malignant neoplasm of left ovary (HCC)  04/04/2024 Imaging   CT abdomen/pelvis: IMPRESSION: 1. Findings concerning for a cystic neoplasm  of the left ovary. Gynecology referral and further characterization of the pelvic mass with MRI without and with contrast recommended. 2. Thickened endometrium with irregular lower endometrium. Attention on MRI and further evaluation with hysteroscopy is recommended. 3. Mild sigmoid diverticulosis. No bowel obstruction. 4.  Aortic Atherosclerosis (ICD10-I70.0).   05/02/2024 Cancer Staging   Staging form: Ovary, Fallopian Tube, and Primary Peritoneal Carcinoma, AJCC 8th Edition - Clinical stage from 05/02/2024: FIGO Stage IA (cT1a, cN0, cM0) - Signed by Masters Hoy, MD on 05/30/2024 Histopathologic type: Endometrioid adenocarcinoma, NOS Histologic grade (G): G2 Histologic grading system: 4 grade system   05/02/2024 Surgery   Procedures: Robotic-assisted total laparoscopic hysterectomy, bilateral salpingo-oophorectomy, left pelvic lymphadenectomy, conversion to laparotomy for left periotic lymphadenectomy and omentectomy   Findings: On entry to abdomen, normal upper abdominal survey with smooth diaphragm, liver, stomach and normal appearing omentum and bowel.  Evidence of prior cholecystectomy.  Dense adhesions of the omentum to the anterior abdominal wall and involving a periumbilical hernia.  In the pelvis, globally normal-appearing uterus.  Bilateral fallopian tubes with evidence of prior tubal ligation.  Normal-appearing right ovary.  Left ovary with a cystic and solid mass.  Intraoperative frozen section of left ovarian mass consistent with adenocarcinoma unable to further characterize.  Uterus with endometrial polyps but without intrauterine malignancy concern.  At time of left pelvic lymphadenectomy, distal extrailiac lymph nodes appeared grossly enlarged.  Representative lymph nodes sent for frozen pathology and returned benign.  Limited safe visualization of periotic region robotically and thus converted to laparotomy to complete para-aortic lymphadenectomy.  No palpably enlarged  para-aortic or right pelvic lymph nodes.  Omentum grossly normal-appearing.  Adhesions in the right lower quadrant at site of prior appendectomy.    05/02/2024 Initial Diagnosis   Malignant neoplasm of left ovary (HCC)  05/02/2024 Pathology Results   A. UTERUS, CERVIX, FALLOPIAN TUBE, OVARY, BILATERAL, HYSTERECTOMY: - Focal invasive endometrioid adenocarcinoma, grade 2, arising in a background of borderline endometrioid tumor, approximately 8.8 cm, involving the left ovary - Ovarian surface is not involved by carcinoma - Benign bilateral fallopian tubes and right ovary, negative for tumor - Uterus with leiomyomata, largest measuring 1.9 cm, some with calcification - Benign endometrial polyp - Benign unremarkable cervix - See oncology table  B. LYMPH NODE, EXTERNAL ILIAC, EXCISION: - Lymph node, negative for carcinoma (0/1)  C. LYMPH NODE, LEFT PELVIC, EXCISION: - Lymph node, negative for carcinoma (0/1)  D. PERITONEAL, ANTERIOR CUL DE SAC, BIOPSY: - Negative for carcinoma  E. PERITONEAL, POSTERIOR CUL DE SAC, BIOPSY: - Negative for carcinoma  F. PERITONEAL, RIGHT PELVIC, BIOPSY: - Negative for carcinoma  G. PERITONEAL, LEFT PELVIC, BIOPSY: - Negative for carcinoma  H. PERITONEAL, LEFT PARACOLIC GUTTER, BIOPSY: - Negative for carcinoma  I. PERITONEAL, RIGHT PARACOLIC GUTTER, BIOPSY: - Negative for carcinoma  J. OMENTUM: - Portion of omentum, negative for carcinoma  K. LYMPH NODE, LEFT PARAORTIC, EXCISION: - Lymph node, negative for carcinoma (0/1)  L. HERNIA SAC, EXCISION: - Hernia sac - No evidence of malignancy  ONCOLOGY TABLE:  OVARY or FALLOPIAN TUBE or PRIMARY PERITONEUM: Resection  Procedure: Hysterectomy with bilateral salpingo-oophorectomy Specimen Integrity: Intact Tumor Site: Left ovary Tumor Size: Total tumor measures approximately 8.8 cm Histologic Type: Invasive endometrioid adenocarcinoma Histologic Grade: Grade 2 Ovarian Surface  Involvement: Not identified Fallopian Tube Surface Involvement: Not identified Implants: Not applicable Lymphatic and/or Vascular Invasion: Not identified Other Tissue/ Organ Involvement: Not applicable Largest Extrapelvic Peritoneal Focus: Not applicable Peritoneal/Ascitic Fluid Involvement: Negative for tumor Chemotherapy Response Score (CRS): Not applicable, no known presurgical therapy Regional Lymph Nodes:      Number of Nodes with Metastasis Greater than 10 mm: 0      Number of Nodes with Metastasis 10 mm or Less (excludes isolated tumor cells): 0      Number of Nodes with Isolated Tumor Cells (0.2 mm or less): 0      Number of Lymph Nodes Examined: 3 Distant Metastasis:      Distant Site(s) Involved: Not applicable Pathologic Stage Classification (pTNM, AJCC 8th Edition): pT1a, pN0 Ancillary Studies: Can be performed upon request Representative Tumor Block: A8    05/02/2024 Pathology Results   FINAL MICROSCOPIC DIAGNOSIS:  - No malignant cells identified      Interval History: Pt reports that she is recovering well from surgery. She is using chronic pain meds for pain. She is eating and drinking well. She is voiding without issue and having regular bowel movements. No bleeding.   Past Medical/Surgical History: Past Medical History:  Diagnosis Date   Anemia    sickle cell trait   Anxiety    Arthritis    knees (10/12/2013)   CHF (congestive heart failure) (HCC)    Chronic back pain    Chronic bronchitis (HCC)    got it q yr when I lived in [place]   Complication of anesthesia    hard to put under   COPD (chronic obstructive pulmonary disease) (HCC)    Dysrhythmia    Family history of anesthesia complication    sister hard to wake up   Family history of anesthesia complication    niece have itching   Fibromyalgia    GERD (gastroesophageal reflux disease)    History of kidney stones    Hyperlipidemia    Hypertension    Irregular  heart beat    Migraine     used to get them alot; not regular anymore (10/12/2013)   PAD (peripheral artery disease)    Pituitary tumor    followed at [facility]; on cabergoline  09/2013   Pneumonia    twice   Type II diabetes mellitus [facility])     Past Surgical History:  Procedure Laterality Date   ABDOMINAL AORTAGRAM N/A 06/28/2012   Procedure: ABDOMINAL EZELLA;  Surgeon: Erick JONELLE Bergamo, MD;  Location: [facility] CATH LAB;  Service: Cardiovascular;  Laterality: N/A;   APPENDECTOMY     BACK SURGERY     x 2, lower back   BILATERAL CARPAL TUNNEL RELEASE     CARDIAC CATHETERIZATION     CARPAL TUNNEL RELEASE Right 12/2022   CATARACT EXTRACTION Left 2023   CATARACT EXTRACTION Right 2023   CESAREAN SECTION  1974; 1976; 1978   CHOLECYSTECTOMY N/A 10/12/2013   Procedure: LAPAROSCOPIC CHOLECYSTECTOMY WITH INTRAOPERATIVE CHOLANGIOGRAM;  Surgeon: Donnice POUR. Belinda, MD;  Location: [facility] OR;  Service: General;  Laterality: N/A;   COLONOSCOPY     FEMORAL ARTERY STENT Left 06/28/2012   SFA   LAPAROSCOPIC CHOLECYSTECTOMY  10/12/2013   LEFT HEART CATHETERIZATION WITH CORONARY ANGIOGRAM N/A 06/28/2012   Procedure: LEFT HEART CATHETERIZATION WITH CORONARY ANGIOGRAM;  Surgeon: Erick JONELLE Bergamo, MD;  Location: [facility] CATH LAB;  Service: Cardiovascular;  Laterality: N/A;   LOWER EXTREMITY ANGIOGRAM N/A 06/28/2012   Procedure: LOWER EXTREMITY ANGIOGRAM;  Surgeon: Erick JONELLE Bergamo, MD;  Location: [facility] CATH LAB;  Service: Cardiovascular;  Laterality: N/A;   OMENTECTOMY  05/02/2024   Procedure: OMENTECTOMY;  Surgeon: [name], MD;  Location: [facility] ORS;  Service: Gynecology;;   PERIPHERAL VASCULAR CATHETERIZATION N/A 09/10/2015   Procedure: Abdominal Aortogram w/Lower Extremity;  Surgeon: Gordy Bergamo, MD;  Location: [facility] INVASIVE CV LAB;  Service: Cardiovascular;  Laterality: N/A;   POSTERIOR LUMBAR FUSION     REPLACEMENT TOTAL KNEE Left    ROBOTIC ASSISTED TOTAL HYSTERECTOMY WITH BILATERAL SALPINGO OOPHERECTOMY Bilateral  05/02/2024   Procedure: ROBOTIC ASSISTED TOTAL LAPAROSCOPIC HYSTERECTOMY BILATERAL SALPINGO-OOPHORECTOMY CONVERTED TO LAPAROTOMY FOR PARA-AORTIC LYMPH NODE DISSECTION;  Surgeon: [name], MD;  Location: [facility] ORS;  Service: Gynecology;  Laterality: Bilateral;  ROBOTIC ASSISTED, POSSIBLE STAGING   ROBOTIC PELVIC AND PARA-AORTIC LYMPH NODE DISSECTION Left 05/02/2024   Procedure: LEFT PELVIC AND PARA-AORTIC LYMPHADENECTOMY;  Surgeon: [name], MD;  Location: [facility] ORS;  Service: Gynecology;  Laterality: Left;   TONSILLECTOMY     TUBAL LIGATION  1978    Family History  Problem Relation Age of Onset   Diabetes Mother    Kidney disease Mother    Pancreatic cancer Father    Kidney cancer Sister    Pancreatic cancer Sister    Kidney disease Brother    Lymphoma Brother    Pancreatic cancer Brother    Liver cancer Brother    Breast cancer Niece    Colon cancer Neg Hx    Inflammatory bowel disease Neg Hx    Esophageal cancer Neg Hx    Liver disease Neg Hx    Rectal cancer Neg Hx    Stomach cancer Neg Hx    Ovarian cancer Neg Hx     Social History   Socioeconomic History   Marital status: Divorced    Spouse name: Not on file   Number of children: 3   Years of education: 16   Highest education level: Not on file  Occupational History    Comment: retired [employer]  Tobacco Use   Smoking status: Former    Current packs/day: 0.00    Average packs/day: 0.5 packs/day for 46.0 years (23.0 ttl pk-yrs)    Types: Cigarettes    Start date: 06/20/2000    Quit date: 09/29/2015    Years since quitting: 8.6   Smokeless tobacco: Never  Vaping Use   Vaping status: Some Days  Substance and Sexual Activity   Alcohol use: No    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   Drug use: No   Sexual activity: Not Currently  Other Topics Concern   Not on file  Social History Narrative   Lives with daughter    caffeine- none   Social Drivers of Health   Tobacco Use: Medium Risk (05/25/2024)    Patient History    Smoking Tobacco Use: Former    Smokeless Tobacco Use: Never    Passive Exposure: Not on Actuary Strain: Not on file  Food Insecurity: Not on file  Transportation Needs: Not on file  Physical Activity: Not on file  Stress: Not on file  Social Connections: Unknown (09/20/2021)   Received from [facility]   Social Network    Social Network: Not on file  Depression (PHQ2-9): Not on file  Alcohol Screen: Not on file  Housing: Not on file  Utilities: Not on file  Health Literacy: Not on file    Current Medications: Current Medications[1]  Review of Symptoms: Complete 10-system review is positive for: Appetite changes, joint pain, back pain, pelvic pain, fatigue, weight loss, hot flashes  Physical Exam: BP 126/68 Comment: manual recheck  Pulse 81   Temp 98.2 F (36.8 C) (Oral)   Resp 19   Wt 204 lb 6.4 oz (92.7 kg)   SpO2 98%   BMI 37.39 kg/m  General: Alert, oriented, no acute distress. HEENT: Normocephalic, atraumatic. Neck symmetric without masses. Sclera anicteric.  Chest: Normal work of breathing. Clear to auscultation bilaterally.   Cardiovascular: Regular rate and rhythm, no murmurs. Abdomen: Soft, nontender.  Normoactive bowel sounds.  No masses appreciated.  Well-healing incisions. Extremities: Grossly normal range of motion.  Warm, well perfused.  No edema bilaterally. Skin: No rashes or lesions noted. GU: Normal appearing external genitalia without erythema, excoriation, or lesions.  Speculum exam reveals intact well healing cuff.  Bimanual exam reveals intact cuff. Exam chaperoned by Kimberly Jordan, CMA   Laboratory & Radiologic Studies: Surgical pathology (05/02/24): A. UTERUS, CERVIX, FALLOPIAN TUBE, OVARY, BILATERAL, HYSTERECTOMY: - Focal invasive endometrioid adenocarcinoma, grade 2, arising in a background of borderline endometrioid tumor, approximately 8.8 cm, involving the left ovary - Ovarian surface is not involved  by carcinoma - Benign bilateral fallopian tubes and right ovary, negative for tumor - Uterus with leiomyomata, largest measuring 1.9 cm, some with calcification - Benign endometrial polyp - Benign unremarkable cervix - See oncology table  B. LYMPH NODE, EXTERNAL ILIAC, EXCISION: - Lymph node, negative for carcinoma (0/1)  C. LYMPH NODE, LEFT PELVIC, EXCISION: - Lymph node, negative for carcinoma (0/1)  D. PERITONEAL, ANTERIOR CUL DE SAC, BIOPSY: - Negative for carcinoma  E. PERITONEAL, POSTERIOR CUL DE SAC, BIOPSY: - Negative for carcinoma  F. PERITONEAL, RIGHT PELVIC, BIOPSY: - Negative for carcinoma  G. PERITONEAL, LEFT PELVIC, BIOPSY: - Negative for carcinoma  H. PERITONEAL, LEFT PARACOLIC GUTTER, BIOPSY: - Negative for carcinoma  I. PERITONEAL, RIGHT PARACOLIC GUTTER, BIOPSY: - Negative for carcinoma  J. OMENTUM: - Portion of omentum, negative for carcinoma  K. LYMPH NODE, LEFT PARAORTIC,  EXCISION: - Lymph node, negative for carcinoma (0/1)  L. HERNIA SAC, EXCISION: - Hernia sac - No evidence of malignancy      [1]  Current Outpatient Medications:    albuterol  (PROVENTIL ) (2.5 MG/3ML) 0.083% nebulizer solution, Take 2.5 mg by nebulization every 6 (six) hours as needed for wheezing or shortness of breath., Disp: , Rfl:    albuterol  (VENTOLIN  HFA) 108 (90 Base) MCG/ACT inhaler, Inhale 1 puff into the lungs every 6 (six) hours as needed for wheezing or shortness of breath. ProAir , Disp: , Rfl:    aspirin  EC 81 MG tablet, Take 1 tablet (81 mg total) by mouth daily. Swallow whole., Disp: 90 tablet, Rfl: 3   budesonide -formoterol  (SYMBICORT ) 160-4.5 MCG/ACT inhaler, Inhale 2 puffs into the lungs daily as needed (wheezing/sob)., Disp: , Rfl:    cabergoline  (DOSTINEX ) 0.5 MG tablet, Take 0.5 tablets (0.25 mg total) by mouth 2 (two) times a week., Disp: 13 tablet, Rfl: 3   dapagliflozin  propanediol (FARXIGA ) 10 MG TABS tablet, Take 1 tablet (10 mg total) by mouth  daily., Disp: 90 tablet, Rfl: 3   diclofenac Sodium (VOLTAREN) 1 % GEL, Apply 1 Application topically daily as needed (pain)., Disp: , Rfl:    ferrous sulfate  325 (65 FE) MG tablet, Take 325 mg by mouth daily., Disp: , Rfl:    furosemide  (LASIX ) 40 MG tablet, Take 1 tablet (40 mg total) by mouth daily., Disp: 90 tablet, Rfl: 1   Insulin  Lispro Prot & Lispro (HUMALOG  MIX 75/25 KWIKPEN) (75-25) 100 UNIT/ML Kwikpen, Inject 8 Units into the skin in the morning and at bedtime., Disp: , Rfl:    Insulin  Pen Needle 32G X 4 MM MISC, 1 Device by Does not apply route in the morning and at bedtime., Disp: 200 each, Rfl: 3   lisinopril -hydrochlorothiazide  (PRINZIDE ,ZESTORETIC ) 20-12.5 MG per tablet, Take 1 tablet by mouth daily. , Disp: , Rfl:    lubiprostone  (AMITIZA ) 24 MCG capsule, Take 24 mcg by mouth 2 (two) times daily with a meal., Disp: , Rfl:    mometasone  (NASONEX ) 50 MCG/ACT nasal spray, Place 2 sprays into the nose as needed (allergies)., Disp: , Rfl:    Multiple Vitamin (MULTIVITAMIN) capsule, Take 1 capsule by mouth daily., Disp: , Rfl:    naproxen (NAPROSYN) 250 MG tablet, Take 250 mg by mouth daily as needed for mild pain (pain score 1-3) or moderate pain (pain score 4-6)., Disp: , Rfl:    nortriptyline  (PAMELOR ) 25 MG capsule, Take 25 mg by mouth at bedtime., Disp: , Rfl:    Olopatadine  HCl 0.2 % SOLN, Apply 1 drop to eye daily. (Patient taking differently: Place 1 drop into both eyes daily.), Disp: 2.5 mL, Rfl: 0   oxyCODONE -acetaminophen  (PERCOCET) 10-325 MG tablet, Take 1 tablet by mouth in the morning, at noon, in the evening, and at bedtime., Disp: , Rfl:    pregabalin  (LYRICA ) 200 MG capsule, Take 200 mg by mouth 3 (three) times daily., Disp: , Rfl:    promethazine  (PHENERGAN ) 25 MG tablet, Take 25 mg by mouth every 6 (six) hours as needed for nausea or vomiting. , Disp: , Rfl:    rosuvastatin  (CRESTOR ) 40 MG tablet, Take 1 tablet (40 mg total) by mouth daily. (Patient taking differently:  Take 40 mg by mouth every evening.), Disp: 90 tablet, Rfl: 1   SPIRIVA  RESPIMAT 2.5 MCG/ACT AERS, INHALE 2 PUFFS INTO THE LUNGS DAILY (Patient taking differently: Inhale 2 puffs into the lungs daily as needed (wheezing/sob).), Disp: 4 g, Rfl:  5   temazepam (RESTORIL) 7.5 MG capsule, Take 7.5 mg by mouth at bedtime., Disp: , Rfl:    tirzepatide  (MOUNJARO ) 10 MG/0.5ML Pen, Inject 10 mg into the skin once a week. (Patient not taking: Reported on 05/25/2024), Disp: 6 mL, Rfl: 3   tiZANidine  (ZANAFLEX ) 4 MG tablet, Take 4 mg by mouth 3 (three) times daily., Disp: , Rfl:    Vitamin D, Ergocalciferol, (DRISDOL) 50000 UNITS CAPS capsule, Take 50,000 Units by mouth every Tuesday., Disp: , Rfl:

## 2024-05-29 NOTE — Progress Notes (Signed)
 Gynecologic Oncology Multi-Disciplinary Disposition Conference Note  Date of the Conference: 05/29/2024  Patient Name: Christina Solis  Referring Provider: Dr. Lequita Primary GYN Oncologist: Dr. Eldonna   Stage/Disposition:  Stage IA, grade 2 invasive endometrioid adenocarcinoma of the left ovary. Disposition is to CT chest imaging followed by observation. Also referral to genetic counseling.  This Multidisciplinary conference took place involving physicians from Gynecologic Oncology, Medical Oncology, Radiation Oncology, Pathology, Radiology along with the Gynecologic Oncology Nurse Practitioner and Gynecologic Oncology Nurse Navigator.  Comprehensive assessment of the patient's malignancy, staging, need for surgery, chemotherapy, radiation therapy, and need for further testing were reviewed. Supportive measures, both inpatient and following discharge were also discussed. The recommended plan of care is documented. Greater than 35 minutes were spent correlating and coordinating this patient's care.

## 2024-05-29 NOTE — Patient Instructions (Signed)
 It was a pleasure to see you in clinic today. - Healing well from surgery. - No heavy lifting until 6wks postop. Nothing in the vagina until 10 weeks postop - CT scan in 3 months - Return visit planned for 3 months  Thank you very much for allowing me to provide care for you today.  I appreciate your confidence in choosing our Gynecologic Oncology team at [facility].  If you have any questions about your visit today please call our office or send us  a MyChart message and we will get back to you as soon as possible.

## 2024-05-30 DIAGNOSIS — C562 Malignant neoplasm of left ovary: Secondary | ICD-10-CM | POA: Insufficient documentation

## 2024-05-31 ENCOUNTER — Inpatient Hospital Stay: Attending: Psychiatry | Admitting: Genetic Counselor

## 2024-05-31 ENCOUNTER — Encounter: Payer: Self-pay | Admitting: Psychiatry

## 2024-05-31 ENCOUNTER — Inpatient Hospital Stay

## 2024-06-01 ENCOUNTER — Encounter: Payer: Self-pay | Admitting: Oncology

## 2024-06-01 NOTE — Progress Notes (Signed)
 Requested MMR and ER/PR on accession [id number] [id number] with [facility]ology per Dr. Eldonna.

## 2024-06-05 ENCOUNTER — Telehealth: Payer: Self-pay | Admitting: *Deleted

## 2024-06-05 ENCOUNTER — Ambulatory Visit (HOSPITAL_COMMUNITY)

## 2024-06-05 LAB — SURGICAL PATHOLOGY

## 2024-06-05 NOTE — Telephone Encounter (Signed)
 Spoke with patient who called the office stating she tried to contact scheduling for her CT scan that she is unable to attend this morning because she has the flu. Spoke with scheduling and patient's Chest CT has been rescheduled for February 2nd at 0800. Pt verbalized understanding and thanked the office for calling.

## 2024-06-06 ENCOUNTER — Ambulatory Visit (HOSPITAL_COMMUNITY)

## 2024-06-12 ENCOUNTER — Other Ambulatory Visit (HOSPITAL_COMMUNITY): Payer: Self-pay

## 2024-06-14 ENCOUNTER — Ambulatory Visit (HOSPITAL_COMMUNITY)

## 2024-06-19 ENCOUNTER — Ambulatory Visit (HOSPITAL_COMMUNITY)

## 2024-06-22 ENCOUNTER — Ambulatory Visit: Admitting: Internal Medicine

## 2024-06-22 ENCOUNTER — Ambulatory Visit (HOSPITAL_COMMUNITY)

## 2024-06-30 ENCOUNTER — Ambulatory Visit (HOSPITAL_COMMUNITY)

## 2024-07-07 ENCOUNTER — Inpatient Hospital Stay

## 2024-09-01 ENCOUNTER — Ambulatory Visit: Admitting: Internal Medicine

## 2024-09-04 ENCOUNTER — Other Ambulatory Visit (HOSPITAL_COMMUNITY)

## 2024-09-11 ENCOUNTER — Inpatient Hospital Stay: Admitting: Psychiatry
